# Patient Record
Sex: Male | Born: 1950 | Race: White | Hispanic: No | Marital: Married | Smoking: Never smoker
Health system: Southern US, Community
[De-identification: ages and names within clinical notes are randomized; demographics above are authoritative.]

## PROBLEM LIST (undated history)

## (undated) DIAGNOSIS — I10 Essential (primary) hypertension: Secondary | ICD-10-CM

## (undated) DIAGNOSIS — Z87448 Personal history of other diseases of urinary system: Secondary | ICD-10-CM

## (undated) DIAGNOSIS — J329 Chronic sinusitis, unspecified: Secondary | ICD-10-CM

## (undated) DIAGNOSIS — E785 Hyperlipidemia, unspecified: Secondary | ICD-10-CM

## (undated) DIAGNOSIS — J189 Pneumonia, unspecified organism: Secondary | ICD-10-CM

## (undated) DIAGNOSIS — G4733 Obstructive sleep apnea (adult) (pediatric): Secondary | ICD-10-CM

## (undated) DIAGNOSIS — R011 Cardiac murmur, unspecified: Secondary | ICD-10-CM

## (undated) DIAGNOSIS — M179 Osteoarthritis of knee, unspecified: Secondary | ICD-10-CM

## (undated) DIAGNOSIS — S42009A Fracture of unspecified part of unspecified clavicle, initial encounter for closed fracture: Secondary | ICD-10-CM

## (undated) DIAGNOSIS — I499 Cardiac arrhythmia, unspecified: Secondary | ICD-10-CM

## (undated) DIAGNOSIS — K219 Gastro-esophageal reflux disease without esophagitis: Secondary | ICD-10-CM

## (undated) DIAGNOSIS — Z87438 Personal history of other diseases of male genital organs: Secondary | ICD-10-CM

## (undated) DIAGNOSIS — R519 Headache, unspecified: Secondary | ICD-10-CM

## (undated) DIAGNOSIS — M1710 Unilateral primary osteoarthritis, unspecified knee: Secondary | ICD-10-CM

## (undated) DIAGNOSIS — K760 Fatty (change of) liver, not elsewhere classified: Secondary | ICD-10-CM

## (undated) DIAGNOSIS — I509 Heart failure, unspecified: Secondary | ICD-10-CM

## (undated) DIAGNOSIS — J40 Bronchitis, not specified as acute or chronic: Secondary | ICD-10-CM

## (undated) DIAGNOSIS — R4189 Other symptoms and signs involving cognitive functions and awareness: Secondary | ICD-10-CM

## (undated) DIAGNOSIS — Z8719 Personal history of other diseases of the digestive system: Secondary | ICD-10-CM

## (undated) DIAGNOSIS — N181 Chronic kidney disease, stage 1: Secondary | ICD-10-CM

## (undated) DIAGNOSIS — N02B9 Other recurrent and persistent immunoglobulin A nephropathy: Secondary | ICD-10-CM

## (undated) DIAGNOSIS — I739 Peripheral vascular disease, unspecified: Secondary | ICD-10-CM

## (undated) DIAGNOSIS — N028 Recurrent and persistent hematuria with other morphologic changes: Secondary | ICD-10-CM

## (undated) DIAGNOSIS — Z9989 Dependence on other enabling machines and devices: Secondary | ICD-10-CM

## (undated) DIAGNOSIS — J302 Other seasonal allergic rhinitis: Secondary | ICD-10-CM

## (undated) HISTORY — DX: Personal history of other diseases of male genital organs: Z87.438

## (undated) HISTORY — PX: OTHER SURGICAL HISTORY: SHX169

## (undated) HISTORY — PX: HERNIA REPAIR: SHX51

## (undated) HISTORY — DX: Personal history of other diseases of urinary system: Z87.448

## (undated) HISTORY — DX: Gastro-esophageal reflux disease without esophagitis: K21.9

## (undated) HISTORY — DX: Other seasonal allergic rhinitis: J30.2

## (undated) HISTORY — DX: Recurrent and persistent hematuria with other morphologic changes: N02.8

## (undated) HISTORY — DX: Other recurrent and persistent immunoglobulin A nephropathy: N02.B9

## (undated) HISTORY — DX: Fracture of unspecified part of unspecified clavicle, initial encounter for closed fracture: S42.009A

## (undated) HISTORY — DX: Osteoarthritis of knee, unspecified: M17.9

## (undated) HISTORY — DX: Chronic kidney disease, stage 1: N18.1

## (undated) HISTORY — DX: Obstructive sleep apnea (adult) (pediatric): G47.33

## (undated) HISTORY — PX: CERVICAL SPINE SURGERY: SHX589

## (undated) HISTORY — PX: TONSILLECTOMY: SUR1361

## (undated) HISTORY — DX: Unilateral primary osteoarthritis, unspecified knee: M17.10

## (undated) HISTORY — DX: Cardiac murmur, unspecified: R01.1

## (undated) HISTORY — DX: Dependence on other enabling machines and devices: Z99.89

## (undated) HISTORY — DX: Peripheral vascular disease, unspecified: I73.9

## (undated) HISTORY — PX: CATARACT EXTRACTION: SUR2

## (undated) SURGERY — ENDOSCOPY, POUCH, SMALL INTESTINE, DIAGNOSTIC
Anesthesia: Monitor Anesthesia Care

---

## 2001-08-19 ENCOUNTER — Encounter: Payer: Self-pay | Admitting: Endocrinology

## 2001-08-19 ENCOUNTER — Ambulatory Visit (HOSPITAL_COMMUNITY): Admission: RE | Admit: 2001-08-19 | Discharge: 2001-08-19 | Payer: Self-pay | Admitting: Endocrinology

## 2003-03-06 ENCOUNTER — Ambulatory Visit (HOSPITAL_COMMUNITY): Admission: RE | Admit: 2003-03-06 | Discharge: 2003-03-06 | Payer: Self-pay | Admitting: Internal Medicine

## 2003-03-06 LAB — PULMONARY FUNCTION TEST

## 2003-10-23 ENCOUNTER — Ambulatory Visit (HOSPITAL_COMMUNITY): Admission: RE | Admit: 2003-10-23 | Discharge: 2003-10-23 | Payer: Self-pay | Admitting: Gastroenterology

## 2003-10-23 ENCOUNTER — Encounter (INDEPENDENT_AMBULATORY_CARE_PROVIDER_SITE_OTHER): Payer: Self-pay | Admitting: Specialist

## 2006-11-27 ENCOUNTER — Emergency Department (HOSPITAL_COMMUNITY): Admission: EM | Admit: 2006-11-27 | Discharge: 2006-11-27 | Payer: Self-pay | Admitting: Emergency Medicine

## 2009-04-21 DIAGNOSIS — J301 Allergic rhinitis due to pollen: Secondary | ICD-10-CM | POA: Insufficient documentation

## 2009-04-21 DIAGNOSIS — K219 Gastro-esophageal reflux disease without esophagitis: Secondary | ICD-10-CM | POA: Insufficient documentation

## 2009-04-21 DIAGNOSIS — M159 Polyosteoarthritis, unspecified: Secondary | ICD-10-CM | POA: Insufficient documentation

## 2009-04-21 DIAGNOSIS — E782 Mixed hyperlipidemia: Secondary | ICD-10-CM | POA: Insufficient documentation

## 2009-10-13 ENCOUNTER — Encounter: Admission: RE | Admit: 2009-10-13 | Discharge: 2009-10-13 | Payer: Self-pay | Admitting: Internal Medicine

## 2009-10-26 ENCOUNTER — Encounter: Admission: RE | Admit: 2009-10-26 | Discharge: 2009-10-26 | Payer: Self-pay | Admitting: Neurological Surgery

## 2009-12-13 ENCOUNTER — Encounter: Admission: RE | Admit: 2009-12-13 | Discharge: 2009-12-13 | Payer: Self-pay | Admitting: Neurological Surgery

## 2010-09-23 NOTE — Op Note (Signed)
Donald [name], Donald [name]                         ACCOUNT NO.:  [id number]   MEDICAL RECORD NO.:  [id number]                   PATIENT TYPE:  AMB   LOCATION:  ENDO                                 FACILITY:  [facility]   PHYSICIAN:  Petra Kuba, M.D.                 DATE OF BIRTH:  06-Jan-1951   DATE OF PROCEDURE:  10/23/2003  DATE OF DISCHARGE:                                 OPERATIVE REPORT   PROCEDURE:  Colonoscopy.   INDICATIONS FOR PROCEDURE:  Screening.   Consent was signed after risks, benefits, methods, and options were  thoroughly discussed in the office.   MEDICINES USED:  Demerol 60, Versed 8.   DESCRIPTION OF PROCEDURE:  Rectal inspection was pertinent for small  external hemorrhoids. Digital exam was negative. The pediatric video  adjustable colonoscope was inserted, fairly easily advanced around the colon  to the cecum.  On insertion, some left sided diverticula was seen as well as  a proximal ascending just before the hepatic flexure pedunculated polyp. No  other abnormalities were seen.  To advance to the cecum required some left  lower quadrant pressure but no position changes. The cecum was identified  the appendiceal orifice and the ileocecal valve.  In fact, the scope was  inserted a short ways into the terminal ileum which was normal, photo  documentation was obtained. The scope was slowly withdrawn. Along the  ileocecal valve, there was a possible sessile polyp which was hot biopsied  x3 and put in the first container, the scope was slowly withdrawn.  The prep  was adequate. There was some liquid stool that required washing and  suctioning. In the proximal level of the hepatic flexure, questionable other  polyp was seen, soft, atypical appearance and was cold biopsied x3 and put  in the second container. We then went ahead and snared the pedunculated  polyp, it was a little harder than most polyps and a little lighter,  possibly a stromal cell tumor. Once this was  snared and removed,  electrocautery applied, the polyp was grabbed with the snare and we went  ahead and withdrew the scope back to the rectum, the polyp was recovered.  The scope was reinserted, advanced to the level of the polypectomy site,  there was no signs of bleeding.  The scope was then further withdrawn.  No  obvious residual polyp was seen. The scope was further withdrawn. The rest  of the transverse was normal. The scope was withdrawn around the left side  of the colon, three tiny left sided polyps were seen and were all hot  biopsied x1 or 2 and put in a fourth container.  There was some left sided  diverticula mentioned above, anorectal pullthrough and retroflexion  confirmed some small hemorrhoids.  He did have some difficulty holding air  which made complete evaluation of the sigmoid and rectum difficult but with  holding his rectum shut  I believe adequate visualization was obtained.  The  scope was reinserted a short ways up the left side of the colon, air was  suctioned, scope removed.  The patient tolerated the procedure well. There  was no obvious or immediate complications.   ENDOSCOPIC DIAGNOSIS:  1. Internal and external hemorrhoids.  2. Left occasional diverticula.  3. Three tiny left sided polyps hot biopsied.  4. Proximal transverse possible stromal cell tumor versus polyp status post     snare.  5. Hepatic flexure questionable polyp cold biopsied.  6. Ileocecal valve questionable polyp hot biopsied.  Otherwise within normal     limits to the terminal ileum.   PLAN:  Await pathology to determine future colonic screening.  One week of  holding aspirin and nonsteroidal's.  Happy to see back p.r.n. otherwise  return care to Dr. Felipa Eth for the customary health care maintenance to include  yearly rectals and guaiacs.                                               Petra Kuba, M.D.    MEM/MEDQ  D:  10/23/2003  T:  10/23/2003  Job:  [id number]   cc:   Larina Earthly,  M.D.  [address]  [place]  [place] [address]  Fax: [phone]

## 2011-02-20 LAB — I-STAT 8, (EC8 V) (CONVERTED LAB)
BUN: 16
Chloride: 105
HCT: 47
Hemoglobin: 16
Operator id: 277751
Potassium: 3.4 — ABNORMAL LOW
Sodium: 141
pCO2, Ven: 42.4 — ABNORMAL LOW

## 2011-02-20 LAB — POCT CARDIAC MARKERS: CKMB, poc: <1 — ABNORMAL LOW

## 2011-07-06 ENCOUNTER — Encounter (HOSPITAL_COMMUNITY): Payer: Self-pay | Admitting: Emergency Medicine

## 2011-07-06 ENCOUNTER — Other Ambulatory Visit: Payer: Self-pay

## 2011-07-06 ENCOUNTER — Emergency Department (HOSPITAL_COMMUNITY): Payer: PRIVATE HEALTH INSURANCE

## 2011-07-06 ENCOUNTER — Inpatient Hospital Stay (HOSPITAL_COMMUNITY)
Admission: EM | Admit: 2011-07-06 | Discharge: 2011-07-10 | DRG: 194 | Disposition: A | Discharge status: Home / Self Care | Payer: PRIVATE HEALTH INSURANCE | Attending: Internal Medicine | Admitting: Internal Medicine

## 2011-07-06 DIAGNOSIS — Z87441 Personal history of nephrotic syndrome: Secondary | ICD-10-CM

## 2011-07-06 DIAGNOSIS — J189 Pneumonia, unspecified organism: Principal | ICD-10-CM

## 2011-07-06 DIAGNOSIS — E119 Type 2 diabetes mellitus without complications: Secondary | ICD-10-CM | POA: Diagnosis present

## 2011-07-06 DIAGNOSIS — E1165 Type 2 diabetes mellitus with hyperglycemia: Secondary | ICD-10-CM | POA: Insufficient documentation

## 2011-07-06 DIAGNOSIS — E785 Hyperlipidemia, unspecified: Secondary | ICD-10-CM | POA: Diagnosis present

## 2011-07-06 DIAGNOSIS — R0902 Hypoxemia: Secondary | ICD-10-CM

## 2011-07-06 DIAGNOSIS — R091 Pleurisy: Secondary | ICD-10-CM | POA: Diagnosis not present

## 2011-07-06 DIAGNOSIS — R0602 Shortness of breath: Secondary | ICD-10-CM | POA: Diagnosis present

## 2011-07-06 DIAGNOSIS — J45901 Unspecified asthma with (acute) exacerbation: Secondary | ICD-10-CM | POA: Diagnosis present

## 2011-07-06 DIAGNOSIS — I10 Essential (primary) hypertension: Secondary | ICD-10-CM | POA: Diagnosis present

## 2011-07-06 DIAGNOSIS — N059 Unspecified nephritic syndrome with unspecified morphologic changes: Secondary | ICD-10-CM | POA: Diagnosis present

## 2011-07-06 DIAGNOSIS — Z8249 Family history of ischemic heart disease and other diseases of the circulatory system: Secondary | ICD-10-CM

## 2011-07-06 DIAGNOSIS — D72829 Elevated white blood cell count, unspecified: Secondary | ICD-10-CM | POA: Diagnosis present

## 2011-07-06 DIAGNOSIS — I519 Heart disease, unspecified: Secondary | ICD-10-CM | POA: Diagnosis present

## 2011-07-06 HISTORY — DX: Essential (primary) hypertension: I10

## 2011-07-06 HISTORY — DX: Pneumonia, unspecified organism: J18.9

## 2011-07-06 HISTORY — DX: Bronchitis, not specified as acute or chronic: J40

## 2011-07-06 HISTORY — DX: Chronic sinusitis, unspecified: J32.9

## 2011-07-06 HISTORY — DX: Hyperlipidemia, unspecified: E78.5

## 2011-07-06 LAB — CREATININE, SERUM
Creatinine, Ser: 0.65 mg/dL (ref 0.50–1.35)
GFR calc Af Amer: >90 mL/min (ref >90)
GFR calc non Af Amer: >90 mL/min (ref >90)

## 2011-07-06 LAB — CBC
HCT: 45.9 % (ref 39.0–52.0)
Hemoglobin: 15.7 g/dL (ref 13.0–17.0)
MCH: 28.9 pg (ref 26.0–34.0)
MCHC: 34.2 g/dL (ref 30.0–36.0)
MCV: 84.4 fL (ref 78.0–100.0)
Platelets: 316 10*3/uL (ref 150–400)
Platelets: 321 10*3/uL (ref 150–400)
RBC: 5.44 MIL/uL (ref 4.22–5.81)
RBC: 5.39 MIL/uL (ref 4.22–5.81)
RDW: 14.8 % (ref 11.5–15.5)
WBC: 14.9 10*3/uL — ABNORMAL HIGH (ref 4.0–10.5)
WBC: 14.8 10*3/uL — ABNORMAL HIGH (ref 4.0–10.5)

## 2011-07-06 LAB — DIFFERENTIAL
Basophils Absolute: 0 10*3/uL (ref 0.0–0.1)
Basophils Relative: 0 % (ref 0–1)
Eosinophils Absolute: 0.1 K/uL (ref 0.0–0.7)
Eosinophils Relative: 1 % (ref 0–5)
Lymphocytes Relative: 27 % (ref 12–46)
Lymphs Abs: 4 10*3/uL (ref 0.7–4.0)
Monocytes Absolute: 0.8 K/uL (ref 0.1–1.0)
Monocytes Relative: 6 % (ref 3–12)
Neutro Abs: 9.9 10*3/uL — ABNORMAL HIGH (ref 1.7–7.7)
Neutrophils Relative %: 67 % (ref 43–77)

## 2011-07-06 LAB — CARDIAC PANEL(CRET KIN+CKTOT+MB+TROPI)
CK, MB: 4.6 ng/mL — ABNORMAL HIGH (ref 0.3–4.0)
Relative Index: 2.8 — ABNORMAL HIGH (ref 0.0–2.5)
Troponin I: <0.3 ng/mL (ref <0.30)

## 2011-07-06 LAB — BASIC METABOLIC PANEL
CO2: 25 mEq/L (ref 19–32)
GFR calc non Af Amer: >90 mL/min (ref >90)
Glucose, Bld: 103 mg/dL — ABNORMAL HIGH (ref 70–99)
Potassium: 3.3 mEq/L — ABNORMAL LOW (ref 3.5–5.1)
Sodium: 140 mEq/L (ref 135–145)

## 2011-07-06 LAB — BASIC METABOLIC PANEL WITH GFR
BUN: 18 mg/dL (ref 6–23)
Calcium: 9.6 mg/dL (ref 8.4–10.5)
Chloride: 98 meq/L (ref 96–112)
Creatinine, Ser: 0.62 mg/dL (ref 0.50–1.35)
GFR calc Af Amer: >90 mL/min (ref >90)

## 2011-07-06 MED ORDER — OMEGA-3-ACID ETHYL ESTERS 1 G PO CAPS
1.0000 g | ORAL_CAPSULE | Freq: Every day | ORAL | Status: DC
  Administered 2011-07-06 – 2011-07-09 (×4): 1 g via ORAL
  Filled 2011-07-06 (×5): qty 1

## 2011-07-06 MED ORDER — IPRATROPIUM BROMIDE 0.02 % IN SOLN
0.5000 mg | Freq: Once | RESPIRATORY_TRACT | Status: AC
  Administered 2011-07-06: 0.5 mg via RESPIRATORY_TRACT
  Filled 2011-07-06: qty 2.5

## 2011-07-06 MED ORDER — DILTIAZEM HCL ER COATED BEADS 240 MG PO CP24
240.0000 mg | ORAL_CAPSULE | Freq: Every day | ORAL | Status: DC
Start: 2011-07-07 — End: 2011-07-10
  Administered 2011-07-07 – 2011-07-10 (×4): 240 mg via ORAL
  Filled 2011-07-06 (×4): qty 1

## 2011-07-06 MED ORDER — BUPROPION HCL ER (XL) 150 MG PO TB24
150.0000 mg | ORAL_TABLET | Freq: Every day | ORAL | Status: DC
  Administered 2011-07-07 – 2011-07-10 (×4): 150 mg via ORAL
  Filled 2011-07-06 (×4): qty 1

## 2011-07-06 MED ORDER — DEXTROSE 5 % IV SOLN
1.0000 g | INTRAVENOUS | Status: DC
  Administered 2011-07-06 – 2011-07-09 (×4): 1 g via INTRAVENOUS
  Filled 2011-07-06 (×5): qty 10

## 2011-07-06 MED ORDER — ONDANSETRON HCL 4 MG PO TABS: 4.0000 mg | ORAL_TABLET | Freq: Four times a day (QID) | ORAL | Status: DC | PRN

## 2011-07-06 MED ORDER — NEBIVOLOL HCL 10 MG PO TABS
10.0000 mg | ORAL_TABLET | Freq: Every evening | ORAL | Status: DC
  Administered 2011-07-06 – 2011-07-09 (×4): 10 mg via ORAL
  Filled 2011-07-06 (×5): qty 1

## 2011-07-06 MED ORDER — NITROGLYCERIN 0.4 MG SL SUBL: 0.4000 mg | SUBLINGUAL_TABLET | SUBLINGUAL | Status: DC | PRN

## 2011-07-06 MED ORDER — HYDROCHLOROTHIAZIDE 12.5 MG PO CAPS
12.5000 mg | ORAL_CAPSULE | Freq: Every day | ORAL | Status: DC
  Administered 2011-07-06 – 2011-07-10 (×5): 12.5 mg via ORAL
  Filled 2011-07-06 (×5): qty 1

## 2011-07-06 MED ORDER — ASPIRIN EC 325 MG PO TBEC
325.0000 mg | DELAYED_RELEASE_TABLET | Freq: Every day | ORAL | Status: DC
  Administered 2011-07-07 – 2011-07-10 (×4): 325 mg via ORAL
  Filled 2011-07-06 (×4): qty 1

## 2011-07-06 MED ORDER — ACETAMINOPHEN 650 MG RE SUPP: 650.0000 mg | Freq: Four times a day (QID) | RECTAL | Status: DC | PRN

## 2011-07-06 MED ORDER — METHYLPREDNISOLONE SODIUM SUCC 125 MG IJ SOLR
125.0000 mg | Freq: Once | INTRAMUSCULAR | Status: AC
  Administered 2011-07-06: 125 mg via INTRAVENOUS
  Filled 2011-07-06: qty 2

## 2011-07-06 MED ORDER — ALBUTEROL SULFATE (5 MG/ML) 0.5% IN NEBU
2.5000 mg | INHALATION_SOLUTION | Freq: Four times a day (QID) | RESPIRATORY_TRACT | Status: DC
  Administered 2011-07-07 – 2011-07-10 (×6): 2.5 mg via RESPIRATORY_TRACT
  Filled 2011-07-06 (×9): qty 0.5

## 2011-07-06 MED ORDER — FLUTICASONE PROPIONATE 50 MCG/ACT NA SUSP
2.0000 | Freq: Every day | NASAL | Status: DC | PRN
  Filled 2011-07-06: qty 16

## 2011-07-06 MED ORDER — HYDROCOD POLST-CHLORPHEN POLST 10-8 MG/5ML PO LQCR
5.0000 mL | Freq: Once | ORAL | Status: AC
  Administered 2011-07-06: 5 mL via ORAL
  Filled 2011-07-06: qty 5

## 2011-07-06 MED ORDER — SODIUM CHLORIDE 0.9 % IJ SOLN
3.0000 mL | Freq: Two times a day (BID) | INTRAMUSCULAR | Status: DC
  Administered 2011-07-06 – 2011-07-10 (×8): 3 mL via INTRAVENOUS

## 2011-07-06 MED ORDER — ONDANSETRON HCL 4 MG/2ML IJ SOLN: 4.0000 mg | Freq: Four times a day (QID) | INTRAMUSCULAR | Status: DC | PRN

## 2011-07-06 MED ORDER — OLMESARTAN MEDOXOMIL 40 MG PO TABS
40.0000 mg | ORAL_TABLET | Freq: Every day | ORAL | Status: DC
  Administered 2011-07-06 – 2011-07-10 (×5): 40 mg via ORAL
  Filled 2011-07-06 (×5): qty 1

## 2011-07-06 MED ORDER — FENOFIBRATE 160 MG PO TABS
160.0000 mg | ORAL_TABLET | Freq: Every day | ORAL | Status: DC
  Administered 2011-07-07 – 2011-07-10 (×4): 160 mg via ORAL
  Filled 2011-07-06 (×4): qty 1

## 2011-07-06 MED ORDER — ENOXAPARIN SODIUM 40 MG/0.4ML ~~LOC~~ SOLN
40.0000 mg | Freq: Every day | SUBCUTANEOUS | Status: DC
  Administered 2011-07-06 – 2011-07-09 (×4): 40 mg via SUBCUTANEOUS
  Filled 2011-07-06 (×5): qty 0.4

## 2011-07-06 MED ORDER — DEXTROSE 5 % IV SOLN
500.0000 mg | INTRAVENOUS | Status: DC
  Administered 2011-07-06 – 2011-07-09 (×4): 500 mg via INTRAVENOUS
  Filled 2011-07-06 (×6): qty 500

## 2011-07-06 MED ORDER — INSULIN ASPART 100 UNIT/ML ~~LOC~~ SOLN
0.0000 [IU] | Freq: Three times a day (TID) | SUBCUTANEOUS | Status: DC
  Filled 2011-07-06: qty 3

## 2011-07-06 MED ORDER — DEXTROSE 5 % IV SOLN
500.0000 mg | Freq: Once | INTRAVENOUS | Status: AC
  Administered 2011-07-06: 500 mg via INTRAVENOUS
  Filled 2011-07-06: qty 500

## 2011-07-06 MED ORDER — DEXTROSE 5 % IV SOLN
1.0000 g | Freq: Once | INTRAVENOUS | Status: AC
  Administered 2011-07-06: 1 g via INTRAVENOUS
  Filled 2011-07-06: qty 10

## 2011-07-06 MED ORDER — ACETAMINOPHEN 325 MG PO TABS
650.0000 mg | ORAL_TABLET | Freq: Four times a day (QID) | ORAL | Status: DC | PRN
  Administered 2011-07-07 (×2): 650 mg via ORAL
  Filled 2011-07-06 (×2): qty 2

## 2011-07-06 MED ORDER — ALBUTEROL SULFATE (5 MG/ML) 0.5% IN NEBU
5.0000 mg | INHALATION_SOLUTION | Freq: Once | RESPIRATORY_TRACT | Status: AC
  Administered 2011-07-06: 5 mg via RESPIRATORY_TRACT
  Filled 2011-07-06: qty 1

## 2011-07-06 MED ORDER — LORATADINE 10 MG PO TABS
10.0000 mg | ORAL_TABLET | Freq: Every day | ORAL | Status: DC
  Administered 2011-07-07 – 2011-07-10 (×4): 10 mg via ORAL
  Filled 2011-07-06 (×4): qty 1

## 2011-07-06 MED ORDER — ALBUTEROL SULFATE (5 MG/ML) 0.5% IN NEBU
2.5000 mg | INHALATION_SOLUTION | RESPIRATORY_TRACT | Status: DC | PRN
  Administered 2011-07-08: 2.5 mg via RESPIRATORY_TRACT
  Filled 2011-07-06: qty 0.5

## 2011-07-06 MED ORDER — INSULIN ASPART 100 UNIT/ML ~~LOC~~ SOLN: 0.0000 [IU] | Freq: Every day | SUBCUTANEOUS | Status: DC

## 2011-07-06 MED ORDER — BUDESONIDE 0.5 MG/2ML IN SUSP
0.5000 mg | Freq: Two times a day (BID) | RESPIRATORY_TRACT | Status: DC
  Administered 2011-07-07 – 2011-07-10 (×3): 0.5 mg via RESPIRATORY_TRACT
  Filled 2011-07-06 (×10): qty 2

## 2011-07-06 MED ORDER — SODIUM CHLORIDE 0.9 % IJ SOLN: 3.0000 mL | Freq: Two times a day (BID) | INTRAMUSCULAR | Status: DC

## 2011-07-06 MED ORDER — NITROGLYCERIN 0.4 MG/HR TD PT24
0.4000 mg | MEDICATED_PATCH | Freq: Every day | TRANSDERMAL | Status: DC
  Administered 2011-07-06: 0.4 mg via TRANSDERMAL
  Filled 2011-07-06 (×2): qty 1

## 2011-07-06 MED ORDER — OLMESARTAN MEDOXOMIL-HCTZ 40-12.5 MG PO TABS: 1.0000 | ORAL_TABLET | Freq: Every evening | ORAL | Status: DC

## 2011-07-06 MED ORDER — GLIMEPIRIDE 1 MG PO TABS
1.0000 mg | ORAL_TABLET | Freq: Every day | ORAL | Status: DC
  Administered 2011-07-07 – 2011-07-10 (×4): 1 mg via ORAL
  Filled 2011-07-06 (×5): qty 1

## 2011-07-06 NOTE — ED Notes (Signed)
Patient is resting comfortably. 

## 2011-07-06 NOTE — ED Notes (Signed)
Per [facility]- EMS called to [facility] where patient had gone for a cough.  Chest xray confirmed pneumonia and EMS was called to transport patient to ED. BP 170/100 HR 80 and regular sats 94% on 2L.

## 2011-07-06 NOTE — ED Notes (Signed)
Pt eating dinner. hospitalist at bedside

## 2011-07-06 NOTE — ED Notes (Signed)
Family at bedside. 

## 2011-07-06 NOTE — ED Notes (Signed)
Diet ordered 

## 2011-07-06 NOTE — ED Provider Notes (Signed)
History     CSN: [id number]  Arrival date & time 07/06/11  1636   First MD Initiated Contact with Patient 07/06/11 1711      Chief Complaint  Patient presents with  . Cough    (Consider location/radiation/quality/duration/timing/severity/associated sxs/prior treatment) HPI Comments: Pt with h.o pneumonia, battling sinus infection, URI, chest tightness similar to prior episodes for about 1.5 weeks, having been on augmentin, z pak, seen on Monday by PCP adn again today.  CXR from Monday apparently over-read as pneumonia then.  Here, gettign more SOB, anterior upper chest and base of throat gets tight, especially with too much talking or coughing spell and gets SOB, feels he cannot breathe deeply or else he will get a coughing fit.  Has been on steroid taper, inhaler and tussionex at home, but still getting worse.  PCP sent here, for admit, possibly just overnight.  No N/V/D.  No fevers, occasional chills.  No back pain, abd pain.  Went on a cruise about 1-2 months ago.  No lower extremity swelling or calf tenderness  Patient is a 61 y.o. male presenting with cough. The history is provided by the patient and the spouse.  Cough Associated symptoms include sore throat and shortness of breath. Pertinent negatives include no headaches.    Past Medical History  Diagnosis Date  . Diabetes mellitus   . Hypertension   . Hyperlipidemia   . Pneumonia   . Sinusitis   . Bronchitis     No past surgical history on file.  No family history on file.  History  Substance Use Topics  . Smoking status: Never Smoker   . Smokeless tobacco: Not on file  . Alcohol Use:       Review of Systems  HENT: Positive for sore throat and sinus pressure.   Respiratory: Positive for cough, chest tightness and shortness of breath.   Gastrointestinal: Negative for nausea, vomiting, abdominal pain and diarrhea.  Neurological: Negative for weakness and headaches.  All other systems reviewed and are  negative.    Allergies  Ciprofloxacin and Levaquin  Home Medications   Current Outpatient Rx  Name Route Sig Dispense Refill  . ACETAMINOPHEN 500 MG PO TABS Oral Take 500 mg by mouth every 6 (six) hours as needed. For pain/ headache    . ALBUTEROL SULFATE HFA 108 (90 BASE) MCG/ACT IN AERS Inhalation Inhale 2 puffs into the lungs every 4 (four) hours as needed. For shortness of breath    . AMOXICILLIN-POT CLAVULANATE 875-125 MG PO TABS Oral Take 1 tablet by mouth 2 (two) times daily. Until 07/08/11    . ASPIRIN EC 81 MG PO TBEC Oral Take 81 mg by mouth daily.    . AZITHROMYCIN 250 MG PO TABS Oral Take 250 mg by mouth daily.    . BUDESONIDE-FORMOTEROL FUMARATE 160-4.5 MCG/ACT IN AERO Inhalation Inhale 2 puffs into the lungs 2 (two) times daily as needed. For wheezing    . BUPROPION HCL ER (XL) 150 MG PO TB24 Oral Take 150 mg by mouth daily.    Marland Kitchen HYDROCOD POLST-CPM POLST ER 10-8 MG/5ML PO LQCR Oral Take 5 mLs by mouth every 12 (twelve) hours as needed. For cough    . DILTIAZEM HCL ER COATED BEADS 240 MG PO CP24 Oral Take 240 mg by mouth daily.    . FENOFIBRATE 160 MG PO TABS Oral Take 160 mg by mouth daily.    Marland Kitchen FEXOFENADINE HCL 180 MG PO TABS Oral Take 180 mg by mouth daily  as needed. For allergies    . FLUTICASONE PROPIONATE 50 MCG/ACT NA SUSP Nasal Place 2 sprays into the nose daily as needed. Allergies    . GLIMEPIRIDE 1 MG PO TABS Oral Take 1 mg by mouth daily before breakfast.    . METFORMIN HCL 1000 MG PO TABS Oral Take 1,000 mg by mouth 2 (two) times daily with a meal.    . NEBIVOLOL HCL 10 MG PO TABS Oral Take 10 mg by mouth every evening.    Marland Kitchen OLMESARTAN MEDOXOMIL-HCTZ 40-12.5 MG PO TABS Oral Take 1 tablet by mouth every evening.    Marland Kitchen OMEGA-3-ACID ETHYL ESTERS 1 G PO CAPS Oral Take 1 g by mouth daily.    Marland Kitchen PREDNISONE (PAK) 10 MG PO TABS Oral Take 5-30 mg by mouth daily. 30mg  for 3 days; 20mg  for 3 days; 10mg  for 3 days; 5mg  for 3 days      BP 147/90  Pulse 83  Temp(Src) 98.7 F  (37.1 C) (Oral)  Resp 22  SpO2 93%  Physical Exam  Nursing note and vitals reviewed. Constitutional: He is oriented to person, place, and time. He appears well-developed and well-nourished.  HENT:  Head: Normocephalic and atraumatic.  Eyes: Pupils are equal, round, and reactive to light. No scleral icterus.  Neck: Neck supple.  Cardiovascular: Normal rate and regular rhythm.   Pulmonary/Chest: Effort normal.       Shallow breathing only, feels can't take a deep breath, minimal wheeze with expiration, no stridor noted  Abdominal: He exhibits no distension. There is no tenderness.  Neurological: He is alert and oriented to person, place, and time.  Skin: Skin is warm and dry.  Psychiatric: He has a normal mood and affect.    ED Course  Procedures (including critical care time)  Labs Reviewed  CBC - Abnormal; Notable for the following:    WBC 14.9 (*)    All other components within normal limits  DIFFERENTIAL - Abnormal; Notable for the following:    Neutro Abs 9.9 (*)    All other components within normal limits  BASIC METABOLIC PANEL - Abnormal; Notable for the following:    Potassium 3.3 (*)    Glucose, Bld 103 (*)    All other components within normal limits   Dg Chest 2 View  07/06/2011  *RADIOLOGY REPORT*  Clinical Data: Cough for 1.5 weeks question pneumonia, history hypertension and diabetes  CHEST - 2 VIEW  Comparison: 11/27/2006  Findings: Enlargement of cardiac silhouette. Mediastinal contours and pulmonary vascularity normal. Left lower lobe opacity question atelectasis versus consolidation. Underlying mass not excluded. Remaining lungs clear. No pleural effusion or pneumothorax. Prior cervical spine fusion.  IMPRESSION: Left lower lobe opacity question atelectasis versus consolidation although underlying mass is not excluded. Follow-up radiographic assessment until resolution recommended to exclude tumor.  Original Report Authenticated By: Lollie Marrow, M.D.     1.  Community acquired pneumonia   2. Hypoxia     sats range 89-95% on [place] which is low.  MDM  Likely needs admission for mild hypoxia, goes down to 89% when talking despite O2 by [place].  Will get labs, give neb, IV steroids, IV abx for CAP and discuss with hospitalist.        7:51 PM Spoke to [facility] who will admit.  I viewed CXR myself, per radiologist, pneumonia.  Pt feels improved at present after nebs.  [facility] to see and admit. WBC is 15  Gavin Pound. Oletta Lamas, MD 07/06/11 1610

## 2011-07-06 NOTE — H&P (Signed)
Donald [name] is an 61 y.o. male.   PCP - Dr.Avva.   Chief Complaint: Shortness of breath. HPI: 36-year-old male with history of diabetes mellitus type 2, hypertension, IgA nephropathy has been experiencing some upper respiratory tract infection which has been treated with Augmentin 10 days ago hand surgery and he was started on Zithromax and prednisone 5 days ago as his symptoms did not get better. Today he came to the ER because at work he felt chest tightness and shortness of breath. His chest tightness was like a heaviness which is increased on coughing. He's been having productive cough since 2 weeks now. His chest x-ray in the ER shows some left-sided consolidation versus atelectasis with the blood work showing leukocytosis patient has been admitted for pneumonia. His EKG shows T-wave changes diffusely. These changes are not present in the early EKG done in 2008. At this time patient is chest pain-free. Patient denies any nausea vomiting diaphoresis dizziness palpitations abdominal pain dysuria discharges or diarrhea.   Past Medical History  Diagnosis Date  . Diabetes mellitus   . Hypertension   . Hyperlipidemia   . Pneumonia   . Sinusitis   . Bronchitis     Past Surgical History  Procedure Date  . Cervical spine surgery   . Hernia repair     Family History  Problem Relation Age of Onset  . Coronary artery disease Father   . Diabetes type II Father    Social History:  reports that he has never smoked. He does not have any smokeless tobacco history on file. His alcohol and drug histories not on file.  Allergies:  Allergies  Allergen Reactions  . Ciprofloxacin     Shoulder cramping  . Levaquin     cramping    Medications Prior to Admission  Medication Dose Route Frequency Provider Last Rate Last Dose  . albuterol (PROVENTIL) (5 MG/ML) 0.5% nebulizer solution 5 mg  5 mg Nebulization Once Gavin Pound. Ghim, MD   5 mg at 07/06/11 1909  . azithromycin (ZITHROMAX) 500 mg in  dextrose 5 % 250 mL IVPB  500 mg Intravenous Once Gavin Pound. Ghim, MD   500 mg at 07/06/11 1919  . cefTRIAXone (ROCEPHIN) 1 g in dextrose 5 % 50 mL IVPB  1 g Intravenous Once Gavin Pound. Ghim, MD   1 g at 07/06/11 1919  . chlorpheniramine-HYDROcodone (TUSSIONEX) 10-8 MG/5ML suspension 5 mL  5 mL Oral Once Gavin Pound. Ghim, MD   5 mL at 07/06/11 1908  . ipratropium (ATROVENT) nebulizer solution 0.5 mg  0.5 mg Nebulization Once Gavin Pound. Ghim, MD   0.5 mg at 07/06/11 1909  . methylPREDNISolone sodium succinate (SOLU-MEDROL) 125 MG injection 125 mg  125 mg Intravenous Once Gavin Pound. Ghim, MD   125 mg at 07/06/11 1919   No current outpatient prescriptions on file as of 07/06/2011.    Results for orders placed during the hospital encounter of 07/06/11 (from the past 48 hour(s))  CBC     Status: Abnormal   Collection Time   07/06/11  6:03 PM      Component Value Range Comment   WBC 14.9 (*) 4.0 - 10.5 (K/uL)    RBC 5.44  4.22 - 5.81 (MIL/uL)    Hemoglobin 15.7  13.0 - 17.0 (g/dL)    HCT 16.1  09.6 - 04.5 (%)    MCV 84.4  78.0 - 100.0 (fL)    MCH 28.9  26.0 - 34.0 (pg)    MCHC 34.2  30.0 - 36.0 (g/dL)    RDW 24.4  01.0 - 27.2 (%)    Platelets 316  150 - 400 (K/uL)   DIFFERENTIAL     Status: Abnormal   Collection Time   07/06/11  6:03 PM      Component Value Range Comment   Neutrophils Relative 67  43 - 77 (%)    Neutro Abs 9.9 (*) 1.7 - 7.7 (K/uL)    Lymphocytes Relative 27  12 - 46 (%)    Lymphs Abs 4.0  0.7 - 4.0 (K/uL)    Monocytes Relative 6  3 - 12 (%)    Monocytes Absolute 0.8  0.1 - 1.0 (K/uL)    Eosinophils Relative 1  0 - 5 (%)    Eosinophils Absolute 0.1  0.0 - 0.7 (K/uL)    Basophils Relative 0  0 - 1 (%)    Basophils Absolute 0.0  0.0 - 0.1 (K/uL)   BASIC METABOLIC PANEL     Status: Abnormal   Collection Time   07/06/11  6:03 PM      Component Value Range Comment   Sodium 140  135 - 145 (mEq/L)    Potassium 3.3 (*) 3.5 - 5.1 (mEq/L)    Chloride 98  96 - 112 (mEq/L)    CO2  25  19 - 32 (mEq/L)    Glucose, Bld 103 (*) 70 - 99 (mg/dL)    BUN 18  6 - 23 (mg/dL)    Creatinine, Ser 5.36  0.50 - 1.35 (mg/dL)    Calcium 9.6  8.4 - 10.5 (mg/dL)    GFR calc non Af Amer >90  >90 (mL/min)    GFR calc Af Amer >90  >90 (mL/min)    Dg Chest 2 View  07/06/2011  *RADIOLOGY REPORT*  Clinical Data: Cough for 1.5 weeks question pneumonia, history hypertension and diabetes  CHEST - 2 VIEW  Comparison: 11/27/2006  Findings: Enlargement of cardiac silhouette. Mediastinal contours and pulmonary vascularity normal. Left lower lobe opacity question atelectasis versus consolidation. Underlying mass not excluded. Remaining lungs clear. No pleural effusion or pneumothorax. Prior cervical spine fusion.  IMPRESSION: Left lower lobe opacity question atelectasis versus consolidation although underlying mass is not excluded. Follow-up radiographic assessment until resolution recommended to exclude tumor.  Original Report Authenticated By: Lollie Marrow, M.D.    Review of Systems  Constitutional: Negative.   HENT: Positive for congestion.   Eyes: Negative.   Respiratory: Positive for cough and shortness of breath.   Cardiovascular: Negative.   Gastrointestinal: Negative.   Genitourinary: Negative.   Musculoskeletal: Negative.   Skin: Negative.   Neurological: Negative.   Endo/Heme/Allergies: Negative.   Psychiatric/Behavioral: Negative.     Blood pressure 147/90, pulse 83, temperature 98.7 F (37.1 C), temperature source Oral, resp. rate 22, SpO2 93.00%. Physical Exam  Constitutional: He is oriented to person, place, and time. He appears well-developed and well-nourished. No distress.  HENT:  Head: Normocephalic and atraumatic.  Right Ear: External ear normal.  Left Ear: External ear normal.  Nose: Nose normal.  Mouth/Throat: Oropharynx is clear and moist. No oropharyngeal exudate.  Eyes: Pupils are equal, round, and reactive to light.  Neck: Normal range of motion. Neck supple.    Cardiovascular: Normal rate, regular rhythm and normal heart sounds.   Respiratory: Effort normal and breath sounds normal. No respiratory distress. He has no wheezes. He has no rales.  GI: Soft. Bowel sounds are normal. He exhibits no distension. There is no tenderness.  Musculoskeletal: Normal range of motion. He exhibits no edema and no tenderness.  Neurological: He is alert and oriented to person, place, and time.       Moves upper and lower extremities 5/5.  Skin: Skin is warm and dry. No rash noted. He is not diaphoretic. No erythema.  Psychiatric: His behavior is normal.     Assessment/Plan #1. Pneumonia - will treat this as community acquired pneumonia. The patient has been start on ceftriaxone and Zithromax which we will continue. #2. Shortness of breath and chest congestion with EKG changes  - shortness of breath may be related to pneumonia but given his EKG changes and his chest tightness I am also concerned about cardiac issues. We will cycle cardiac markers check BNP place patient on nitroglycerin patch and aspirin. I have consulted Dr. Herbie Baltimore who is on call for [facility]. Dr. Herbie Baltimore is advised to keep it into past midnight in case patient requires interventions. Cardiology will be seeing patient in consult. Will also continue his nebulizers and Pulmicort. #3. History of diabetes mellitus type 2  - continue Amaryl with sliding scale coverage and hold off metformin for now. #4. History of hypertension  - continue present medications. #5. History of IgA nephropathy  - normal creatinine at this time. Monitor metabolic panel.  CODE STATUS  - full code.   Eduard Clos 07/06/2011, 9:25 PM

## 2011-07-06 NOTE — ED Notes (Signed)
Patient states he started with a sinus infection x 1 week ago and was seeing his doctor. Patient returned to doctor today with tight feeling in his chest and was causing SOB. Doctors office performed chest xray today and results are right sided infiltrate or atelectasis. Report in patient chart. Patient denies chest pain. Patient states he can not take a deep breath and he feels tightness in his chest when he tries to take deep breath. Patient placed on monitor and 2L oxygen with sats of 95%. EKG captured on arrival.

## 2011-07-07 LAB — GLUCOSE, CAPILLARY
Glucose-Capillary: 202 mg/dL — ABNORMAL HIGH (ref 70–99)
Glucose-Capillary: 196 mg/dL — ABNORMAL HIGH (ref 70–99)

## 2011-07-07 LAB — COMPREHENSIVE METABOLIC PANEL
ALT: 77 U/L — ABNORMAL HIGH (ref 0–53)
Alkaline Phosphatase: 34 U/L — ABNORMAL LOW (ref 39–117)
BUN: 15 mg/dL (ref 6–23)
CO2: 28 mEq/L (ref 19–32)
Calcium: 9.5 mg/dL (ref 8.4–10.5)
GFR calc Af Amer: >90 mL/min (ref >90)
GFR calc non Af Amer: >90 mL/min (ref >90)
Glucose, Bld: 225 mg/dL — ABNORMAL HIGH (ref 70–99)
Sodium: 136 mEq/L (ref 135–145)

## 2011-07-07 LAB — CBC
MCHC: 33.6 g/dL (ref 30.0–36.0)
RDW: 14.7 % (ref 11.5–15.5)

## 2011-07-07 LAB — CARDIAC PANEL(CRET KIN+CKTOT+MB+TROPI)
CK, MB: 4.2 ng/mL — ABNORMAL HIGH (ref 0.3–4.0)
CK, MB: 3.7 ng/mL (ref 0.3–4.0)
Troponin I: <0.3 ng/mL (ref <0.30)

## 2011-07-07 MED ORDER — INSULIN ASPART 100 UNIT/ML ~~LOC~~ SOLN
0.0000 [IU] | SUBCUTANEOUS | Status: DC
  Administered 2011-07-07 (×2): 3 [IU] via SUBCUTANEOUS
  Administered 2011-07-07: 5 [IU] via SUBCUTANEOUS
  Administered 2011-07-07: 11 [IU] via SUBCUTANEOUS
  Administered 2011-07-08: 8 [IU] via SUBCUTANEOUS
  Administered 2011-07-09: 2 [IU] via SUBCUTANEOUS
  Administered 2011-07-10: 5 [IU] via SUBCUTANEOUS
  Administered 2011-07-10: 3 [IU] via SUBCUTANEOUS
  Filled 2011-07-07 (×2): qty 3

## 2011-07-07 MED ORDER — HYDROCODONE-ACETAMINOPHEN 5-325 MG PO TABS: 1.0000 | ORAL_TABLET | ORAL | Status: DC | PRN

## 2011-07-07 MED ORDER — MORPHINE SULFATE 2 MG/ML IJ SOLN: 2.0000 mg | INTRAMUSCULAR | Status: DC | PRN

## 2011-07-07 MED ORDER — HYDROCOD POLST-CHLORPHEN POLST 10-8 MG/5ML PO LQCR
5.0000 mL | Freq: Two times a day (BID) | ORAL | Status: DC | PRN
  Administered 2011-07-07 – 2011-07-08 (×3): 5 mL via ORAL
  Filled 2011-07-07 (×3): qty 5

## 2011-07-07 MED ORDER — INSULIN GLARGINE 100 UNIT/ML ~~LOC~~ SOLN
5.0000 [IU] | Freq: Every day | SUBCUTANEOUS | Status: DC
  Filled 2011-07-07: qty 3

## 2011-07-07 NOTE — Progress Notes (Signed)
Subjective: Patient states that he feels a little better today.  Objective: Weight change:  No intake or output data in the 24 hours ending 07/07/11 1134  Filed Vitals:   07/07/11 1001  BP: 134/81  Pulse:   Temp:   Resp:    General: Patient appears his stated age. HEENT: Head normocephalic atraumatic. Cardiovascular: Regular rate rhythm. Lungs: Clear to auscultation bilaterally. Abdomen: Positive bowel sounds. Extremities: No edema  Lab Results: reviewed  Studies/Results: Dg Chest 2 View  07/06/2011  *RADIOLOGY REPORT*  Clinical Data: Cough for 1.5 weeks question pneumonia, history hypertension and diabetes  CHEST - 2 VIEW  Comparison: 11/27/2006  Findings: Enlargement of cardiac silhouette. Mediastinal contours and pulmonary vascularity normal. Left lower lobe opacity question atelectasis versus consolidation. Underlying mass not excluded. Remaining lungs clear. No pleural effusion or pneumothorax. Prior cervical spine fusion.  IMPRESSION: Left lower lobe opacity question atelectasis versus consolidation although underlying mass is not excluded. Follow-up radiographic assessment until resolution recommended to exclude tumor.  Original Report Authenticated By: Lollie Marrow, M.D.   Medications: Scheduled Meds:   . albuterol  2.5 mg Nebulization Q6H  . albuterol  5 mg Nebulization Once  . aspirin EC  325 mg Oral Daily  . azithromycin (ZITHROMAX) 500 MG IVPB  500 mg Intravenous Once  . azithromycin  500 mg Intravenous Q24H  . budesonide  0.5 mg Nebulization BID  . buPROPion  150 mg Oral Daily  . cefTRIAXone (ROCEPHIN)  IV  1 g Intravenous Once  . cefTRIAXone (ROCEPHIN)  IV  1 g Intravenous Q24H  . chlorpheniramine-HYDROcodone  5 mL Oral Once  . diltiazem  240 mg Oral Daily  . enoxaparin  40 mg Subcutaneous QHS  . fenofibrate  160 mg Oral Daily  . glimepiride  1 mg Oral QAC breakfast  . hydrochlorothiazide  12.5 mg Oral Daily  . insulin aspart  0-5 Units Subcutaneous QHS    . insulin aspart  0-9 Units Subcutaneous TID WC  . ipratropium  0.5 mg Nebulization Once  . loratadine  10 mg Oral Daily  . methylPREDNISolone (SOLU-MEDROL) injection  125 mg Intravenous Once  . nebivolol  10 mg Oral QPM  . nitroGLYCERIN  0.4 mg Transdermal QHS  . olmesartan  40 mg Oral Daily  . omega-3 acid ethyl esters  1 g Oral QHS  . sodium chloride  3 mL Intravenous Q12H  . DISCONTD: olmesartan-hydrochlorothiazide  1 tablet Oral QPM  . DISCONTD: sodium chloride  3 mL Intravenous Q12H   Continuous Infusions:  PRN Meds:.acetaminophen, acetaminophen, albuterol, fluticasone, morphine injection, nitroGLYCERIN, ondansetron (ZOFRAN) IV, ondansetron  Assessment/Plan: Community acquired pneumonia (07/06/2011) Continue IV Rocephin and Zithromax and breathing treatments.   SOB (shortness of breath) (07/06/2011)/chest tightness/EKG changes  Patient is being evaluated by cardiology. Await their recommendations.   Diabetes mellitus, type 2 (07/06/2011) Continue home medications as well as sliding scale insulin.    HTN (hypertension) (07/06/2011)  Continue his diltiazem, hydrochlorothiazide, nebivolol and olmesartan   H/O primary IgA nephropathy (07/06/2011)  stable  Headache: Patient's headache is most likely secondary to nitro paste. Will DC nitro paste and treat with pain medication as needed  LOS: 1 day   Donald [name] 07/07/2011, 11:34 AM

## 2011-07-07 NOTE — Progress Notes (Addendum)
Nutrition Brief Note  Reviewed chart. Patient triggered for "dietitian consulted needed" on admission nutrition screen.  Current weight is 170 (77.1 kg). BMI is 25.85, classifying him as overweight.  No wounds to document. Patient reported appetite was good prior to admission, and was able to eat three meals a day.  Denied problems swallowing or any current N/V. Has been intentionally trying to lose weight; has lost approximately 5 lbs in past four weeks which is desirable given overweight status. Consumed > 75% of his meal last night on a Carbohydrate Modified Medium Calorie diet. Currently NPO, and was asking for breakfast. No nutritional interventions are warranted at this time. Please consult RD as needed.  Alger Memos, RD Pager #: [phone]  Christia Reading, Dietetic Intern Pager # [phone]

## 2011-07-07 NOTE — Consult Note (Signed)
Reason for Consult: EKG Changes Referring Physician: Jomarie Longs  PCP: Avva  Donald [name] is an 61 y.o. male.  HPI: the patient is a 61 year old Caucasian male currently being treated for pneumonia.  He has a history of diabetes mellitus type 2, hypertension,, IgA nephropathy.  His last echocardiogram was approximately 9 years ago results which are unknown. He also had a negative stress test in 2009 [facility] which apparently was negative.  Family history significant in his father who is deceased at age 74 from myocardial infarction. He happened to be a heavy smoker.  The Patient states that week ago Sunday he began having cold symptoms.  This has caused his breathing to feel "tight".  He also states his chest feeling tight and stated "It feels like a child sitting on my chest".  The patient has been treated at Dr. Vicente Males office for sinusitis.  The shortness of breath has been getting progressively worse he has noticed dyspnea on exertion particularly walking and cough.  Patient has recently been getting back into working out and approximately 2 weeks ago was working out at [employer].  At that time he did not experience any tightness  In his chest or chest pain.  He reports occasional chest palpitations at which time he can feel his heart beating "stronger" and "faster".    EKG is significant for T-wave inversion in 23 and aVF as well as V1 through 4. Heart rate 74 beats per minute QTCs 193. The EKG changes are different from previous EKG in 2008.  Cardiac enzymes have been negative.   Past Medical History  Diagnosis Date  . Diabetes mellitus   . Hypertension   . Hyperlipidemia   . Pneumonia   . Sinusitis   . Bronchitis     Past Surgical History  Procedure Date  . Cervical spine surgery   . Hernia repair     Family History  Problem Relation Age of Onset  . Coronary artery disease Father   . Diabetes type II Father     Social History:  reports that he has never smoked. He does not have  any smokeless tobacco history on file. His alcohol and drug histories not on file.  Patient works as a Emergency planning/management officer in Adult nurse. He has been married 33 years and has one male child.  Allergies:  Allergies  Allergen Reactions  . Ciprofloxacin     Shoulder cramping  . Levaquin     cramping    Medications:    . albuterol  2.5 mg Nebulization Q6H  . albuterol  5 mg Nebulization Once  . aspirin EC  325 mg Oral Daily  . azithromycin (ZITHROMAX) 500 MG IVPB  500 mg Intravenous Once  . azithromycin  500 mg Intravenous Q24H  . budesonide  0.5 mg Nebulization BID  . buPROPion  150 mg Oral Daily  . cefTRIAXone (ROCEPHIN)  IV  1 g Intravenous Once  . cefTRIAXone (ROCEPHIN)  IV  1 g Intravenous Q24H  . chlorpheniramine-HYDROcodone  5 mL Oral Once  . diltiazem  240 mg Oral Daily  . enoxaparin  40 mg Subcutaneous QHS  . fenofibrate  160 mg Oral Daily  . glimepiride  1 mg Oral QAC breakfast  . hydrochlorothiazide  12.5 mg Oral Daily  . insulin aspart  0-15 Units Subcutaneous Q4H  . insulin glargine  5 Units Subcutaneous QHS  . ipratropium  0.5 mg Nebulization Once  . loratadine  10 mg Oral Daily  . methylPREDNISolone (  SOLU-MEDROL) injection  125 mg Intravenous Once  . nebivolol  10 mg Oral QPM  . nitroGLYCERIN  0.4 mg Transdermal QHS  . olmesartan  40 mg Oral Daily  . omega-3 acid ethyl esters  1 g Oral QHS  . sodium chloride  3 mL Intravenous Q12H  . DISCONTD: insulin aspart  0-5 Units Subcutaneous QHS  . DISCONTD: insulin aspart  0-9 Units Subcutaneous TID WC  . DISCONTD: olmesartan-hydrochlorothiazide  1 tablet Oral QPM  . DISCONTD: sodium chloride  3 mL Intravenous Q12H     Results for orders placed during the hospital encounter of 07/06/11 (from the past 48 hour(s))  CBC     Status: Abnormal   Collection Time   07/06/11  6:03 PM      Component Value Range Comment   WBC 14.9 (*) 4.0 - 10.5 (K/uL)    RBC 5.44  4.22 - 5.81 (MIL/uL)    Hemoglobin 15.7   13.0 - 17.0 (g/dL)    HCT 16.1  09.6 - 04.5 (%)    MCV 84.4  78.0 - 100.0 (fL)    MCH 28.9  26.0 - 34.0 (pg)    MCHC 34.2  30.0 - 36.0 (g/dL)    RDW 40.9  81.1 - 91.4 (%)    Platelets 316  150 - 400 (K/uL)   DIFFERENTIAL     Status: Abnormal   Collection Time   07/06/11  6:03 PM      Component Value Range Comment   Neutrophils Relative 67  43 - 77 (%)    Neutro Abs 9.9 (*) 1.7 - 7.7 (K/uL)    Lymphocytes Relative 27  12 - 46 (%)    Lymphs Abs 4.0  0.7 - 4.0 (K/uL)    Monocytes Relative 6  3 - 12 (%)    Monocytes Absolute 0.8  0.1 - 1.0 (K/uL)    Eosinophils Relative 1  0 - 5 (%)    Eosinophils Absolute 0.1  0.0 - 0.7 (K/uL)    Basophils Relative 0  0 - 1 (%)    Basophils Absolute 0.0  0.0 - 0.1 (K/uL)   BASIC METABOLIC PANEL     Status: Abnormal   Collection Time   07/06/11  6:03 PM      Component Value Range Comment   Sodium 140  135 - 145 (mEq/L)    Potassium 3.3 (*) 3.5 - 5.1 (mEq/L)    Chloride 98  96 - 112 (mEq/L)    CO2 25  19 - 32 (mEq/L)    Glucose, Bld 103 (*) 70 - 99 (mg/dL)    BUN 18  6 - 23 (mg/dL)    Creatinine, Ser 7.82  0.50 - 1.35 (mg/dL)    Calcium 9.6  8.4 - 10.5 (mg/dL)    GFR calc non Af Amer >90  >90 (mL/min)    GFR calc Af Amer >90  >90 (mL/min)   CARDIAC PANEL(CRET KIN+CKTOT+MB+TROPI)     Status: Abnormal   Collection Time   07/06/11  9:16 PM      Component Value Range Comment   Total CK 165  7 - 232 (U/L)    CK, MB 4.6 (*) 0.3 - 4.0 (ng/mL)    Troponin I <0.30  <0.30 (ng/mL)    Relative Index 2.8 (*) 0.0 - 2.5    PRO B NATRIURETIC PEPTIDE     Status: Normal   Collection Time   07/06/11  9:39 PM      Component Value Range Comment  Pro B Natriuretic peptide (BNP) 47.5  0 - 125 (pg/mL)   CBC     Status: Abnormal   Collection Time   07/06/11  9:39 PM      Component Value Range Comment   WBC 14.8 (*) 4.0 - 10.5 (K/uL)    RBC 5.39  4.22 - 5.81 (MIL/uL)    Hemoglobin 16.0  13.0 - 17.0 (g/dL)    HCT 86.5  78.4 - 69.6 (%)    MCV 83.7  78.0 - 100.0  (fL)    MCH 29.7  26.0 - 34.0 (pg)    MCHC 35.5  30.0 - 36.0 (g/dL)    RDW 29.5  28.4 - 13.2 (%)    Platelets 321  150 - 400 (K/uL)   CREATININE, SERUM     Status: Normal   Collection Time   07/06/11  9:39 PM      Component Value Range Comment   Creatinine, Ser 0.65  0.50 - 1.35 (mg/dL)    GFR calc non Af Amer >90  >90 (mL/min)    GFR calc Af Amer >90  >90 (mL/min)   GLUCOSE, CAPILLARY     Status: Abnormal   Collection Time   07/06/11  9:41 PM      Component Value Range Comment   Glucose-Capillary 264 (*) 70 - 99 (mg/dL)   CARDIAC PANEL(CRET KIN+CKTOT+MB+TROPI)     Status: Abnormal   Collection Time   07/07/11  5:45 AM      Component Value Range Comment   Total CK 132  7 - 232 (U/L)    CK, MB 4.2 (*) 0.3 - 4.0 (ng/mL)    Troponin I <0.30  <0.30 (ng/mL)    Relative Index 3.2 (*) 0.0 - 2.5    COMPREHENSIVE METABOLIC PANEL     Status: Abnormal   Collection Time   07/07/11  5:45 AM      Component Value Range Comment   Sodium 136  135 - 145 (mEq/L)    Potassium 3.9  3.5 - 5.1 (mEq/L)    Chloride 95 (*) 96 - 112 (mEq/L)    CO2 28  19 - 32 (mEq/L)    Glucose, Bld 225 (*) 70 - 99 (mg/dL)    BUN 15  6 - 23 (mg/dL)    Creatinine, Ser 4.40  0.50 - 1.35 (mg/dL)    Calcium 9.5  8.4 - 10.5 (mg/dL)    Total Protein 6.8  6.0 - 8.3 (g/dL)    Albumin 3.6  3.5 - 5.2 (g/dL)    AST 24  0 - 37 (U/L)    ALT 77 (*) 0 - 53 (U/L)    Alkaline Phosphatase 34 (*) 39 - 117 (U/L)    Total Bilirubin 0.4  0.3 - 1.2 (mg/dL)    GFR calc non Af Amer >90  >90 (mL/min)    GFR calc Af Amer >90  >90 (mL/min)   CBC     Status: Abnormal   Collection Time   07/07/11  5:45 AM      Component Value Range Comment   WBC 13.2 (*) 4.0 - 10.5 (K/uL)    RBC 5.33  4.22 - 5.81 (MIL/uL)    Hemoglobin 15.1  13.0 - 17.0 (g/dL)    HCT 10.2  72.5 - 36.6 (%)    MCV 84.4  78.0 - 100.0 (fL)    MCH 28.3  26.0 - 34.0 (pg)    MCHC 33.6  30.0 - 36.0 (g/dL)    RDW 14.7  11.5 - 15.5 (%)    Platelets 348  150 - 400 (K/uL)   GLUCOSE,  CAPILLARY     Status: Abnormal   Collection Time   07/07/11  7:47 AM      Component Value Range Comment   Glucose-Capillary 202 (*) 70 - 99 (mg/dL)     Dg Chest 2 View  2/95/6213  *RADIOLOGY REPORT*  Clinical Data: Cough for 1.5 weeks question pneumonia, history hypertension and diabetes  CHEST - 2 VIEW  Comparison: 11/27/2006  Findings: Enlargement of cardiac silhouette. Mediastinal contours and pulmonary vascularity normal. Left lower lobe opacity question atelectasis versus consolidation. Underlying mass not excluded. Remaining lungs clear. No pleural effusion or pneumothorax. Prior cervical spine fusion.  IMPRESSION: Left lower lobe opacity question atelectasis versus consolidation although underlying mass is not excluded. Follow-up radiographic assessment until resolution recommended to exclude tumor.  Original Report Authenticated By: Lollie Marrow, M.D.    Review of Systems  Constitutional: Negative for diaphoresis.  HENT: Positive for congestion.   Eyes: Negative for blurred vision and double vision.  Respiratory: Positive for cough and shortness of breath (Dyspnea on exertion.).   Cardiovascular: Positive for chest pain (Chest tightness associated with cough.) and palpitations. Negative for orthopnea, leg swelling and PND.  Gastrointestinal: Negative for nausea, vomiting, abdominal pain, blood in stool and melena.  Genitourinary: Negative for dysuria and hematuria.  Neurological: Positive for headaches. Negative for dizziness and weakness.  All other systems reviewed and are negative.   Blood pressure 134/81, pulse 69, temperature 97.2 F (36.2 C), temperature source Oral, resp. rate 20, height 5\' 8"  (1.727 m), weight 77.111 kg (170 lb), SpO2 94.00%. Physical Exam  Constitutional: He is oriented to person, place, and time. He appears well-developed and well-nourished. No distress.  HENT:  Head: Normocephalic and atraumatic.  Eyes: EOM are normal. Pupils are equal, round, and  reactive to light. Left eye exhibits no discharge. No scleral icterus.  Neck: Normal range of motion. Neck supple. No JVD present.  Cardiovascular: Normal rate and regular rhythm.  Exam reveals gallop and S3. Exam reveals no friction rub.   No murmur heard. Pulses:      Radial pulses are 2+ on the right side, and 2+ on the left side.       Dorsalis pedis pulses are 2+ on the right side, and 2+ on the left side.       Posterior tibial pulses are 2+ on the right side, and 2+ on the left side.       No Carotid or femoral bruit.  Respiratory: Effort normal. He has no wheezes.       Decreased BS particularly on the left base.  GI: Soft. Bowel sounds are normal. He exhibits no distension. There is no tenderness.  Musculoskeletal: He exhibits no edema.  Lymphadenopathy:    He has no cervical adenopathy.  Neurological: He is alert and oriented to person, place, and time. He exhibits normal muscle tone.  Skin: Skin is warm and dry.  Psychiatric: He has a normal mood and affect.    Assessment/Plan: Patient Active Hospital Problem List: Community acquired pneumonia (07/06/2011) SOB (shortness of breath) (07/06/2011) Chest tightness Diabetes mellitus, type 2 (07/06/2011) HTN (hypertension) (07/06/2011) H/O primary IgA nephropathy (07/06/2011) TWave inversions on EKG  Plan: Patient is a 61 year old Caucasian male, currently being treated for pneumonia, with a history of diabetes mellitus, hypertension, hyperlipidemia, IgA nephropathy. He has new EKG changes in the form of T-wave inversions which are different from EKG  in 2008.  Troponin has been negative he has had a mildly elevated CK-MB.   I think it is reasonable to conduct a nuclear stress test in the a.m.  HAGER,BRYAN W 07/07/2011, 11:40 AM   I have seen and examined the patient along with Dwana Melena, PA.  I have reviewed the chart, notes and new data.  I agree with PA's note.  Key new complaints: chest pain is clearly associated with  coughing spells and has mostly pleuritic features Key examination changes: RRR. He has a loud S4, but may also have a faint and incomplete (systolic only) pericardial rub. Key new findings / data: WBC remains elevated; barely noticeable increase in CKMB with normal cTnI and normal total CK is of doubtful clinical value.  ECG shows clear cut anterior T wave inversion and mild ST depression, new from 2007  PLAN: Differential diagnosis is between unstable angina (maybe precipitated by pneumonia) and acute parapneumonic pericarditis in a patient with significant coronary risk factors (DM, HTN, hyperlipidemia).  A Lexiscan myoview and an echocardiogram are appropriate. Absent evidence of full blown acute coronary syndrome, coronary angiography (if necessary) should be delayed until pneumonia resolves.  Thurmon Fair, MD, [facility] [phone] 07/07/2011, 2:32 PM

## 2011-07-08 ENCOUNTER — Inpatient Hospital Stay (HOSPITAL_COMMUNITY): Payer: PRIVATE HEALTH INSURANCE

## 2011-07-08 ENCOUNTER — Other Ambulatory Visit: Payer: Self-pay

## 2011-07-08 HISTORY — PX: NM MYOVIEW LTD: HXRAD82

## 2011-07-08 LAB — BASIC METABOLIC PANEL
Calcium: 9 mg/dL (ref 8.4–10.5)
Creatinine, Ser: 0.9 mg/dL (ref 0.50–1.35)
GFR calc Af Amer: >90 mL/min (ref >90)

## 2011-07-08 LAB — CBC
MCHC: 33.1 g/dL (ref 30.0–36.0)
MCV: 86.1 fL (ref 78.0–100.0)
Platelets: 325 10*3/uL (ref 150–400)
RDW: 14.7 % (ref 11.5–15.5)
WBC: 18 10*3/uL — ABNORMAL HIGH (ref 4.0–10.5)

## 2011-07-08 LAB — GLUCOSE, CAPILLARY
Glucose-Capillary: 280 mg/dL — ABNORMAL HIGH (ref 70–99)
Glucose-Capillary: 132 mg/dL — ABNORMAL HIGH (ref 70–99)

## 2011-07-08 MED ORDER — TECHNETIUM TC 99M TETROFOSMIN IV KIT
30.0000 | PACK | Freq: Once | INTRAVENOUS | Status: AC | PRN
  Administered 2011-07-08: 30 via INTRAVENOUS

## 2011-07-08 MED ORDER — REGADENOSON 0.4 MG/5ML IV SOLN
0.4000 mg | Freq: Once | INTRAVENOUS | Status: AC
  Administered 2011-07-08: 0.4 mg via INTRAVENOUS

## 2011-07-08 MED ORDER — TECHNETIUM TC 99M TETROFOSMIN IV KIT
10.0000 | PACK | Freq: Once | INTRAVENOUS | Status: AC | PRN
  Administered 2011-07-08: 10 via INTRAVENOUS

## 2011-07-08 NOTE — Progress Notes (Signed)
Agree with the NP/PA-C note as written. Plan lexiscan myoview today.   Chrystie Nose, MD, [facility] Attending Cardiologist [facility]

## 2011-07-08 NOTE — Progress Notes (Signed)
  Echocardiogram 2D Echocardiogram has been performed.  Donald [name] 07/08/2011, 5:39 PM

## 2011-07-08 NOTE — Progress Notes (Signed)
Subjective: No chest pain  Objective: Vital signs in last 24 hours: Temp:  [97.9 F (36.6 C)] 97.9 F (36.6 C) (03/02 0500) Pulse Rate:  [65-83] 65  (03/02 0500) Resp:  [14-16] 14  (03/02 0500) BP: (113-137)/(68-82) 118/80 mmHg (03/02 0500) SpO2:  [93 %-94 %] 94 % (03/02 0500) Weight change:  Last BM Date: 07/06/11 Intake/Output from previous day: 03/01 0701 - 03/02 0700 In: 480 [P.O.:480] Out: 3 [Urine:3] Intake/Output this shift:    PE: General: Heart: Lungs: Abd: Ext:    Lab Results:  Basename 07/08/11 0600 07/07/11 0545  WBC 18.0* 13.2*  HGB 14.5 15.1  HCT 43.8 45.0  PLT 325 348   BMET  Basename 07/08/11 0600 07/07/11 0545  NA 142 136  K 3.2* 3.9  CL 99 95*  CO2 33* 28  GLUCOSE 98 225*  BUN 15 15  CREATININE 0.90 0.69  CALCIUM 9.0 9.5    Basename 07/07/11 1257 07/07/11 0545  TROPONINI <0.30 <0.30    No results found for this basename: CHOL, HDL, LDLCALC, LDLDIRECT, TRIG, CHOLHDL   No results found for this basename: HGBA1C     No results found for this basename: TSH    Hepatic Function Panel  Basename 07/07/11 0545  PROT 6.8  ALBUMIN 3.6  AST 24  ALT 77*  ALKPHOS 34*  BILITOT 0.4  BILIDIR --  IBILI --     EKG: Orders placed during the hospital encounter of 07/06/11  . EKG 12-LEAD  . EKG 12-LEAD    Studies/Results: Dg Chest 2 View  07/06/2011  *RADIOLOGY REPORT*  Clinical Data: Cough for 1.5 weeks question pneumonia, history hypertension and diabetes  CHEST - 2 VIEW  Comparison: 11/27/2006  Findings: Enlargement of cardiac silhouette. Mediastinal contours and pulmonary vascularity normal. Left lower lobe opacity question atelectasis versus consolidation. Underlying mass not excluded. Remaining lungs clear. No pleural effusion or pneumothorax. Prior cervical spine fusion.  IMPRESSION: Left lower lobe opacity question atelectasis versus consolidation although underlying mass is not excluded. Follow-up radiographic assessment until  resolution recommended to exclude tumor.  Original Report Authenticated By: Lollie Marrow, M.D.    Medications: I have reviewed the patient's current medications.    Marland Kitchen albuterol  2.5 mg Nebulization Q6H  . aspirin EC  325 mg Oral Daily  . azithromycin  500 mg Intravenous Q24H  . budesonide  0.5 mg Nebulization BID  . buPROPion  150 mg Oral Daily  . cefTRIAXone (ROCEPHIN)  IV  1 g Intravenous Q24H  . diltiazem  240 mg Oral Daily  . enoxaparin  40 mg Subcutaneous QHS  . fenofibrate  160 mg Oral Daily  . glimepiride  1 mg Oral QAC breakfast  . hydrochlorothiazide  12.5 mg Oral Daily  . insulin aspart  0-15 Units Subcutaneous Q4H  . loratadine  10 mg Oral Daily  . nebivolol  10 mg Oral QPM  . olmesartan  40 mg Oral Daily  . omega-3 acid ethyl esters  1 g Oral QHS  . regadenoson  0.4 mg Intravenous Once  . sodium chloride  3 mL Intravenous Q12H  . DISCONTD: insulin aspart  0-5 Units Subcutaneous QHS  . DISCONTD: insulin aspart  0-9 Units Subcutaneous TID WC  . DISCONTD: insulin glargine  5 Units Subcutaneous QHS  . DISCONTD: nitroGLYCERIN  0.4 mg Transdermal QHS  . DISCONTD: sodium chloride  3 mL Intravenous Q12H    Assessment/Plan: Patient Active Problem List  Diagnoses  . Community acquired pneumonia  . SOB (shortness of  breath)  . Diabetes mellitus, type 2  . HTN (hypertension)  . H/O primary IgA nephropathy  Chest pain  PLAN:   LOS: 2 days   Luken Shadowens R 07/08/2011, 9:18 AM

## 2011-07-08 NOTE — Progress Notes (Signed)
Addendum to progress note:  General: alert and oriented pleasant affect Heart:S1S2 RRR, no obvious murmurs Lungs:clear without rales, rhonchi or wheezes Abd:+ BS, soft non tender Ext:no edema  Lexiscan myoview completed without complications.

## 2011-07-08 NOTE — Progress Notes (Signed)
Subjective: Patient states that he feels a little better today.  Objective: Weight change:  No intake or output data in the 24 hours ending 07/07/11 1134  Filed Vitals:   07/07/11 1001  BP: 134/81  Pulse:   Temp:   Resp:    General: Patient appears his stated age. HEENT: Head normocephalic atraumatic. Cardiovascular: Regular rate rhythm. Lungs: Clear to auscultation bilaterally. Abdomen: Positive bowel sounds. Extremities: No edema  Lab Results: reviewed  Studies/Results: Dg Chest 2 View  07/06/2011  *RADIOLOGY REPORT*  Clinical Data: Cough for 1.5 weeks question pneumonia, history hypertension and diabetes  CHEST - 2 VIEW  Comparison: 11/27/2006  Findings: Enlargement of cardiac silhouette. Mediastinal contours and pulmonary vascularity normal. Left lower lobe opacity question atelectasis versus consolidation. Underlying mass not excluded. Remaining lungs clear. No pleural effusion or pneumothorax. Prior cervical spine fusion.  IMPRESSION: Left lower lobe opacity question atelectasis versus consolidation although underlying mass is not excluded. Follow-up radiographic assessment until resolution recommended to exclude tumor.  Original Report Authenticated By: Lollie Marrow, M.D.   Medications: Scheduled Meds:   . albuterol  2.5 mg Nebulization Q6H  . albuterol  5 mg Nebulization Once  . aspirin EC  325 mg Oral Daily  . azithromycin (ZITHROMAX) 500 MG IVPB  500 mg Intravenous Once  . azithromycin  500 mg Intravenous Q24H  . budesonide  0.5 mg Nebulization BID  . buPROPion  150 mg Oral Daily  . cefTRIAXone (ROCEPHIN)  IV  1 g Intravenous Once  . cefTRIAXone (ROCEPHIN)  IV  1 g Intravenous Q24H  . chlorpheniramine-HYDROcodone  5 mL Oral Once  . diltiazem  240 mg Oral Daily  . enoxaparin  40 mg Subcutaneous QHS  . fenofibrate  160 mg Oral Daily  . glimepiride  1 mg Oral QAC breakfast  . hydrochlorothiazide  12.5 mg Oral Daily  . insulin aspart  0-5 Units Subcutaneous QHS    . insulin aspart  0-9 Units Subcutaneous TID WC  . ipratropium  0.5 mg Nebulization Once  . loratadine  10 mg Oral Daily  . methylPREDNISolone (SOLU-MEDROL) injection  125 mg Intravenous Once  . nebivolol  10 mg Oral QPM  . nitroGLYCERIN  0.4 mg Transdermal QHS  . olmesartan  40 mg Oral Daily  . omega-3 acid ethyl esters  1 g Oral QHS  . sodium chloride  3 mL Intravenous Q12H  . DISCONTD: olmesartan-hydrochlorothiazide  1 tablet Oral QPM  . DISCONTD: sodium chloride  3 mL Intravenous Q12H   Continuous Infusions:  PRN Meds:.acetaminophen, acetaminophen, albuterol, fluticasone, morphine injection, nitroGLYCERIN, ondansetron (ZOFRAN) IV, ondansetron  Assessment/Plan: Community acquired pneumonia (07/06/2011) Continue IV Rocephin and Zithromax and breathing treatments, WBC still up  SOB (shortness of breath) (07/06/2011)/chest tightness/EKG changes  S/p lexiscan myoview, negative for inducible ischemia, EF 50%  Diabetes mellitus, type 2 (07/06/2011) Continue home medications as well as sliding scale insulin.    HTN (hypertension) (07/06/2011)  Continue his diltiazem, hydrochlorothiazide, nebivolol and olmesartan   H/O primary IgA nephropathy (07/06/2011)  stable  Dispo:possibly home tomorrow  LOS: 2 days   Harrol Novello 07/08/2011, 2:32 PM

## 2011-07-09 ENCOUNTER — Other Ambulatory Visit: Payer: Self-pay

## 2011-07-09 LAB — CBC
HCT: 43.6 % (ref 39.0–52.0)
Hemoglobin: 14.9 g/dL (ref 13.0–17.0)
RDW: 14.8 % (ref 11.5–15.5)
WBC: 14.5 10*3/uL — ABNORMAL HIGH (ref 4.0–10.5)

## 2011-07-09 LAB — GLUCOSE, CAPILLARY: Glucose-Capillary: 137 mg/dL — ABNORMAL HIGH (ref 70–99)

## 2011-07-09 MED ORDER — ALBUTEROL SULFATE (5 MG/ML) 0.5% IN NEBU: 2.5000 mg | INHALATION_SOLUTION | Freq: Four times a day (QID) | RESPIRATORY_TRACT | Status: DC

## 2011-07-09 MED ORDER — PREDNISONE (PAK) 10 MG PO TABS: 20.0000 mg | ORAL_TABLET | Freq: Every day | ORAL | Status: AC

## 2011-07-09 MED ORDER — PREDNISONE 50 MG PO TABS
60.0000 mg | ORAL_TABLET | Freq: Every day | ORAL | Status: DC
  Administered 2011-07-09: 60 mg via ORAL
  Filled 2011-07-09 (×3): qty 1

## 2011-07-09 MED ORDER — AMOXICILLIN-POT CLAVULANATE 875-125 MG PO TABS: 1.0000 | ORAL_TABLET | Freq: Two times a day (BID) | ORAL | Status: AC

## 2011-07-09 NOTE — Progress Notes (Signed)
Patient ambulated in hallway without oxygen.  Oxygen saturation at the beginning of ambulation 91%, during ambulation 89-90% and at the end of ambulation oxygen saturation dropped to 87% RA.  Patient C/O of mild short of breath.

## 2011-07-09 NOTE — Progress Notes (Signed)
[facility]  DAILY PROGRESS NOTE   Subjective:  Chest pain has resolved. Still feels some tightness in his lungs.  Objective:  Temp:  [98 F (36.7 C)-98.6 F (37 C)] 98.6 F (37 C) (03/03 0600) Pulse Rate:  [61-95] 61  (03/03 0600) Resp:  [16] 16  (03/03 0600) BP: (104-140)/(72-105) 104/72 mmHg (03/03 0600) SpO2:  [91 %-95 %] 93 % (03/03 0600) Weight change:   Intake/Output from previous day: 03/02 0701 - 03/03 0700 In: 3 [I.V.:3] Out: -   Intake/Output from this shift:    Medications: Current Facility-Administered Medications  Medication Dose Route Frequency Provider Last Rate Last Dose  . acetaminophen (TYLENOL) tablet 650 mg  650 mg Oral Q6H PRN Eduard Clos, MD   650 mg at 07/07/11 1243   Or  . acetaminophen (TYLENOL) suppository 650 mg  650 mg Rectal Q6H PRN Eduard Clos, MD      . albuterol (PROVENTIL) (5 MG/ML) 0.5% nebulizer solution 2.5 mg  2.5 mg Nebulization Q6H Eduard Clos, MD   2.5 mg at 07/07/11 1405  . albuterol (PROVENTIL) (5 MG/ML) 0.5% nebulizer solution 2.5 mg  2.5 mg Nebulization Q2H PRN Eduard Clos, MD   2.5 mg at 07/08/11 1928  . aspirin EC tablet 325 mg  325 mg Oral Daily Eduard Clos, MD   325 mg at 07/08/11 1055  . azithromycin (ZITHROMAX) 500 mg in dextrose 5 % 250 mL IVPB  500 mg Intravenous Q24H Eduard Clos, MD   500 mg at 07/08/11 2347  . budesonide (PULMICORT) nebulizer solution 0.5 mg  0.5 mg Nebulization BID Eduard Clos, MD   0.5 mg at 07/07/11 0925  . buPROPion (WELLBUTRIN XL) 24 hr tablet 150 mg  150 mg Oral Daily Eduard Clos, MD   150 mg at 07/08/11 1047  . cefTRIAXone (ROCEPHIN) 1 g in dextrose 5 % 50 mL IVPB  1 g Intravenous Q24H Eduard Clos, MD   1 g at 07/08/11 2230  . chlorpheniramine-HYDROcodone (TUSSIONEX) 10-8 MG/5ML suspension 5 mL  5 mL Oral Q12H PRN Novlet Wandra Mannan, MD   5 mL at 07/08/11 1943  . diltiazem (CARDIZEM CD)  24 hr capsule 240 mg  240 mg Oral Daily Eduard Clos, MD   240 mg at 07/08/11 1048  . enoxaparin (LOVENOX) injection 40 mg  40 mg Subcutaneous QHS Eduard Clos, MD   40 mg at 07/08/11 2230  . fenofibrate tablet 160 mg  160 mg Oral Daily Eduard Clos, MD   160 mg at 07/08/11 1047  . fluticasone (FLONASE) 50 MCG/ACT nasal spray 2 spray  2 spray Each Nare Daily PRN Eduard Clos, MD      . glimepiride (AMARYL) tablet 1 mg  1 mg Oral QAC breakfast Eduard Clos, MD   1 mg at 07/08/11 1046  . hydrochlorothiazide (MICROZIDE) capsule 12.5 mg  12.5 mg Oral Daily Zannie Cove, MD   12.5 mg at 07/08/11 1047  . HYDROcodone-acetaminophen (NORCO) 5-325 MG per tablet 1 tablet  1 tablet Oral Q4H PRN Novlet Wandra Mannan, MD      . insulin aspart (novoLOG) injection 0-15 Units  0-15 Units Subcutaneous Q4H Novlet Wandra Mannan, MD   8 Units at 07/08/11 1252  . loratadine (CLARITIN) tablet 10 mg  10 mg Oral Daily Eduard Clos, MD   10 mg at 07/08/11 1047  . morphine 2 MG/ML injection 2-4 mg  2-4 mg Intravenous  Q4H PRN Novlet Wandra Mannan, MD      . nebivolol (BYSTOLIC) tablet 10 mg  10 mg Oral QPM Eduard Clos, MD   10 mg at 07/08/11 1757  . nitroGLYCERIN (NITROSTAT) SL tablet 0.4 mg  0.4 mg Sublingual Q5 Min x 3 PRN Eduard Clos, MD      . olmesartan (BENICAR) tablet 40 mg  40 mg Oral Daily Zannie Cove, MD   40 mg at 07/08/11 1047  . omega-3 acid ethyl esters (LOVAZA) capsule 1 g  1 g Oral QHS Eduard Clos, MD   1 g at 07/08/11 2230  . ondansetron (ZOFRAN) tablet 4 mg  4 mg Oral Q6H PRN Eduard Clos, MD       Or  . ondansetron [facility]) injection 4 mg  4 mg Intravenous Q6H PRN Eduard Clos, MD      . regadenoson (LEXISCAN) injection SOLN 0.4 mg  0.4 mg Intravenous Once [name] Croitoru, MD   0.4 mg at 07/08/11 0900  . sodium chloride 0.9 % injection 3 mL  3 mL Intravenous Q12H Eduard Clos, MD   3 mL at 07/08/11 2230    . technetium tetrofosmin (TC-MYOVIEW) injection 30 milli Curie  30 milli Curie Intravenous Once PRN Medication Radiologist, MD   30 milli Curie at 07/08/11 0930    Physical Exam: General appearance: alert and no distress Neck: no adenopathy, no carotid bruit, no JVD, supple, symmetrical, trachea midline and thyroid not enlarged, symmetric, no tenderness/mass/nodules Lungs: dullness to percussion LLL Heart: regular rate and rhythm, S1, S2 normal, no murmur, click, rub or gallop Abdomen: soft, non-tender; bowel sounds normal; no masses,  no organomegaly Extremities: extremities normal, atraumatic, no cyanosis or edema  Lab Results: Results for orders placed during the hospital encounter of 07/06/11 (from the past 48 hour(s))  GLUCOSE, CAPILLARY     Status: Abnormal   Collection Time   07/07/11 11:44 AM      Component Value Range Comment   Glucose-Capillary 196 (*) 70 - 99 (mg/dL)   CARDIAC PANEL(CRET KIN+CKTOT+MB+TROPI)     Status: Abnormal   Collection Time   07/07/11 12:57 PM      Component Value Range Comment   Total CK 104  7 - 232 (U/L)    CK, MB 3.7  0.3 - 4.0 (ng/mL)    Troponin I <0.30  <0.30 (ng/mL)    Relative Index 3.6 (*) 0.0 - 2.5    GLUCOSE, CAPILLARY     Status: Abnormal   Collection Time   07/07/11  5:02 PM      Component Value Range Comment   Glucose-Capillary 152 (*) 70 - 99 (mg/dL)   GLUCOSE, CAPILLARY     Status: Abnormal   Collection Time   07/07/11  8:01 PM      Component Value Range Comment   Glucose-Capillary 243 (*) 70 - 99 (mg/dL)   GLUCOSE, CAPILLARY     Status: Abnormal   Collection Time   07/07/11 11:56 PM      Component Value Range Comment   Glucose-Capillary 315 (*) 70 - 99 (mg/dL)   GLUCOSE, CAPILLARY     Status: Abnormal   Collection Time   07/08/11  4:39 AM      Component Value Range Comment   Glucose-Capillary 129 (*) 70 - 99 (mg/dL)   BASIC METABOLIC PANEL     Status: Abnormal   Collection Time   07/08/11  6:00 AM      Component Value Range  Comment   Sodium 142  135 - 145 (mEq/L)    Potassium 3.2 (*) 3.5 - 5.1 (mEq/L)    Chloride 99  96 - 112 (mEq/L)    CO2 33 (*) 19 - 32 (mEq/L)    Glucose, Bld 98  70 - 99 (mg/dL)    BUN 15  6 - 23 (mg/dL)    Creatinine, Ser 8.11  0.50 - 1.35 (mg/dL)    Calcium 9.0  8.4 - 10.5 (mg/dL)    GFR calc non Af Amer >90  >90 (mL/min)    GFR calc Af Amer >90  >90 (mL/min)   CBC     Status: Abnormal   Collection Time   07/08/11  6:00 AM      Component Value Range Comment   WBC 18.0 (*) 4.0 - 10.5 (K/uL)    RBC 5.09  4.22 - 5.81 (MIL/uL)    Hemoglobin 14.5  13.0 - 17.0 (g/dL)    HCT 91.4  78.2 - 95.6 (%)    MCV 86.1  78.0 - 100.0 (fL)    MCH 28.5  26.0 - 34.0 (pg)    MCHC 33.1  30.0 - 36.0 (g/dL)    RDW 21.3  08.6 - 57.8 (%)    Platelets 325  150 - 400 (K/uL)   GLUCOSE, CAPILLARY     Status: Abnormal   Collection Time   07/08/11  7:59 AM      Component Value Range Comment   Glucose-Capillary 104 (*) 70 - 99 (mg/dL)   GLUCOSE, CAPILLARY     Status: Abnormal   Collection Time   07/08/11 11:31 AM      Component Value Range Comment   Glucose-Capillary 280 (*) 70 - 99 (mg/dL)   GLUCOSE, CAPILLARY     Status: Normal   Collection Time   07/08/11  5:11 PM      Component Value Range Comment   Glucose-Capillary 90  70 - 99 (mg/dL)   GLUCOSE, CAPILLARY     Status: Abnormal   Collection Time   07/08/11  8:42 PM      Component Value Range Comment   Glucose-Capillary 132 (*) 70 - 99 (mg/dL)   CBC     Status: Abnormal   Collection Time   07/09/11  6:10 AM      Component Value Range Comment   WBC 14.5 (*) 4.0 - 10.5 (K/uL)    RBC 5.08  4.22 - 5.81 (MIL/uL)    Hemoglobin 14.9  13.0 - 17.0 (g/dL)    HCT 46.9  62.9 - 52.8 (%)    MCV 85.8  78.0 - 100.0 (fL)    MCH 29.3  26.0 - 34.0 (pg)    MCHC 34.2  30.0 - 36.0 (g/dL)    RDW 41.3  24.4 - 01.0 (%)    Platelets 291  150 - 400 (K/uL)   GLUCOSE, CAPILLARY     Status: Abnormal   Collection Time   07/09/11  8:07 AM      Component Value Range Comment    Glucose-Capillary 117 (*) 70 - 99 (mg/dL)     Imaging: Nm Myocar Multi W/spect W/wall Motion / Ef  07/08/2011  *RADIOLOGY REPORT*  Clinical Data:  61 year old male with chest pain and type 2 diabetes.  MYOCARDIAL IMAGING WITH SPECT (REST AND PHARMACOLOGIC-STRESS) GATED LEFT VENTRICULAR WALL MOTION STUDY LEFT VENTRICULAR EJECTION FRACTION  Technique:  Resting myocardial SPECT imaging was initially performed after intravenous administration of radiopharmaceutical. Myocardial SPECT was subsequently performed after additional  radiopharmaceutical injection during pharmacologic-stress supervised by the Cardiology staff.  Quantitative gated imaging was also performed to evaluate left ventricular wall motion, and estimate left ventricular ejection fraction.  Radiopharmaceutical:  Tc-87m Myoview at rest and during stress.  Comparison: none  Findings:  Technique: Study is adequate.  Perfusion:  There are no relative decreased counts on stress or rest to suggest reversible ischemia or infarction.  Wall motion:  No focal wall motion abnormality. Left ventricle is dilated on rest and stress.  Mild septal hypokinesia.  Left ventricular ejection fraction:  Calculated left ventricular ejection fraction =  50%  IMPRESSION:  1.  No reversible ischemia or infarction. 2.  Mild left ventricular dilatation.  Mild septal hypokinesia.  3.  Left ventricular ejection fraction equal 50%  Original Report Authenticated By: Genevive Bi, M.D.    Assessment:  1. Principal Problem: 2.  *Community acquired pneumonia 3. Active Problems: 4.  SOB (shortness of breath) 5.  Diabetes mellitus, type 2 6.  HTN (hypertension) 7.  H/O primary IgA nephropathy 8. Chest pain/pleurisy  Plan:  1. Nuclear stress test negative for ischemia however LVEF is low normal at 50%. Echo also shows mild diffuse hypokinesis without regional variation and EF of 50-55%.  Unclear etiology of this, may be related to infection or hypertension.  Would recommend repeat echo in 6 months and outpatient follow with Dr. Royann Shivers. WBC count is trending down. May be appropriate to switch to po antibiotics and anticipate discharge. We will sign off.  Call with questions.  Time Spent Directly with Patient:  15 minutes  Length of Stay:  LOS: 3 days   Chrystie Nose, MD, [facility] Attending Cardiologist [facility]  Lysa Livengood C 07/09/2011, 8:48 AM

## 2011-07-10 LAB — CBC
HCT: 45.1 % (ref 39.0–52.0)
Hemoglobin: 15.5 g/dL (ref 13.0–17.0)
MCH: 29.3 pg (ref 26.0–34.0)
MCHC: 34.4 g/dL (ref 30.0–36.0)
MCV: 85.3 fL (ref 78.0–100.0)
RBC: 5.29 MIL/uL (ref 4.22–5.81)

## 2011-07-10 MED ORDER — GLIMEPIRIDE 2 MG PO TABS
2.0000 mg | ORAL_TABLET | Freq: Every day | ORAL | Status: DC
  Filled 2011-07-10: qty 1

## 2011-07-10 MED ORDER — METHYLPREDNISOLONE SODIUM SUCC 125 MG IJ SOLR
80.0000 mg | Freq: Four times a day (QID) | INTRAMUSCULAR | Status: DC
  Administered 2011-07-10: 80 mg via INTRAVENOUS
  Filled 2011-07-10: qty 1.28

## 2011-07-10 MED FILL — Regadenoson IV Inj 0.4 MG/5ML (0.08 MG/ML): INTRAVENOUS | Qty: 5 | Status: AC

## 2011-07-10 NOTE — Progress Notes (Signed)
DC orders received.  Patient stable with no S/S of distress.  Discharge and medication instructions reviewed with patient and questions answered.  Patient DC home. Donald [name]

## 2011-07-10 NOTE — Discharge Summary (Signed)
Physician Discharge Summary  Patient ID: Donald [name] MRN: [id number] DOB/AGE: Aug 03, 1950 61 y.o.  Admit date: 07/06/2011 Discharge date: 07/10/2011  Primary Care Physician:  Hoyle Sauer, MD, MD  Discharge Diagnoses:   1. Community acquired pneumonia 2. Reactive airway disease/ Asthma flare 3. Chest tightness 4. Mild systolic dysfunction 5. diabetes mellitus type 2 6. history of primary IgA nephropathy 7. hypertension   Medication List  As of 07/10/2011  1:19 PM   STOP taking these medications         azithromycin 250 MG tablet         TAKE these medications         acetaminophen 500 MG tablet   Commonly known as: TYLENOL   Take 500 mg by mouth every 6 (six) hours as needed. For pain/ headache      albuterol 108 (90 BASE) MCG/ACT inhaler   Commonly known as: PROVENTIL HFA;VENTOLIN HFA   Inhale 2 puffs into the lungs every 4 (four) hours as needed. For shortness of breath      amoxicillin-clavulanate 875-125 MG per tablet   Commonly known as: AUGMENTIN   Take 1 tablet by mouth 2 (two) times daily. For 8 days      aspirin EC 81 MG tablet   Take 81 mg by mouth daily.      budesonide-formoterol 160-4.5 MCG/ACT inhaler   Commonly known as: SYMBICORT   Inhale 2 puffs into the lungs 2 (two) times daily as needed. For wheezing      buPROPion 150 MG 24 hr tablet   Commonly known as: WELLBUTRIN XL   Take 150 mg by mouth daily.      chlorpheniramine-HYDROcodone 10-8 MG/5ML Lqcr   Commonly known as: TUSSIONEX   Take 5 mLs by mouth every 12 (twelve) hours as needed. For cough      diltiazem 240 MG 24 hr capsule   Commonly known as: CARDIZEM CD   Take 240 mg by mouth daily.      fenofibrate 160 MG tablet   Take 160 mg by mouth daily.      fexofenadine 180 MG tablet   Commonly known as: ALLEGRA   Take 180 mg by mouth daily as needed. For allergies      fluticasone 50 MCG/ACT nasal spray   Commonly known as: FLONASE   Place 2 sprays into the nose daily as  needed. Allergies      glimepiride 1 MG tablet   Commonly known as: AMARYL   Take 1 mg by mouth daily before breakfast.      metFORMIN 1000 MG tablet   Commonly known as: GLUCOPHAGE   Take 1,000 mg by mouth 2 (two) times daily with a meal.      nebivolol 10 MG tablet   Commonly known as: BYSTOLIC   Take 10 mg by mouth every evening.      olmesartan-hydrochlorothiazide 40-12.5 MG per tablet   Commonly known as: BENICAR HCT   Take 1 tablet by mouth every evening.      omega-3 acid ethyl esters 1 G capsule   Commonly known as: LOVAZA   Take 1 g by mouth daily.      predniSONE 10 MG tablet   Commonly known as: STERAPRED UNI-PAK   Take 2 tablets (20 mg total) by mouth daily. Take 2 tabs for 2 days, then 1 tab for 2 days then 1/2 tab for 2 days then STOP             Disposition  and Follow-up:  1. Dr.Avva in 1 week 2. Dr. Royann Shivers in 1 month  Consults: Dr.Croitoru with [facility]   Significant Diagnostic Studies:  Dg Chest 2 View 07/06/2011   IMPRESSION: Left lower lobe opacity question atelectasis versus consolidation although underlying mass is not excluded. Follow-up radiographic assessment until resolution recommended to exclude tumor.  Original Report Authenticated By: Lollie Marrow, M.D.    Brief H and P: [name] is a 61 year old male history of diabetes hypertension IgA nephropathy in questionable history of asthma presented to the hospital with shortness of breath, this is associated with chest tightness and heaviness-like sensation in the middle of his chest which was worse with cough upon evaluation in the emergency room he was noted to have left-sided consolidation versus atelectasis and an EGD with diffuse T-wave changes.  Hospital Course:  1.Community acquired pneumonia with reactive airway disease originally started on IV Rocephin and azithromycin, nebulizations with modest clinical improvement. Subsequently treated with prednisone and IV  steroids. We suspect the pneumonia potentially exacerbated his asthma, he is being discharged home on an oral prednisone taper along with Augmentin, given his quinolone allergy and advised to continue his albuterol inhaler and Symbicort. He is advised to followup with his primary care physician in one week, also require a repeat chest x-ray in the near future for radiographic assessment until resolution. He was ambulated prior to discharge and did not require oxygen at rest or with activity at the time of discharge. 2. chest tightness with EKG changes: As ruled out for MI based on 3 sets of cardiac markers, given his abnormal EKG and multiple cardiovascular risk factors underwent a likely scan Myoview which was negative for inducible ischemia however she is mildly decreased LV EF of 50%. He is being followed by [facility] heart [facility] vascular through the this hospitalization and is recommended to have a followup echo in a couple of months. He will be following up with Dr. Royann Shivers.   Time spent on Discharge: Signed: Broderick Fonseca [facility]  07/10/2011, 1:19 PM

## 2011-07-10 NOTE — Progress Notes (Addendum)
Subjective: Breathing ok, some chest tightness with activity  Objective: Weight change:  No intake or output data in the 24 hours ending 07/07/11 1134  Filed Vitals:   07/07/11 1001  BP: 134/81  Pulse:   Temp:   Resp:    General: Patient appears his stated age. HEENT: Head normocephalic atraumatic. Cardiovascular: Regular rate rhythm. Lungs: Clear to auscultation bilaterally. Abdomen: Positive bowel sounds. Extremities: No edema  Lab Results: reviewed  Studies/Results: Dg Chest 2 View  07/06/2011  *RADIOLOGY REPORT*  Clinical Data: Cough for 1.5 weeks question pneumonia, history hypertension and diabetes  CHEST - 2 VIEW  Comparison: 11/27/2006  Findings: Enlargement of cardiac silhouette. Mediastinal contours and pulmonary vascularity normal. Left lower lobe opacity question atelectasis versus consolidation. Underlying mass not excluded. Remaining lungs clear. No pleural effusion or pneumothorax. Prior cervical spine fusion.  IMPRESSION: Left lower lobe opacity question atelectasis versus consolidation although underlying mass is not excluded. Follow-up radiographic assessment until resolution recommended to exclude tumor.  Original Report Authenticated By: Lollie Marrow, M.D.   Medications: Scheduled Meds:   . albuterol  2.5 mg Nebulization Q6H  . albuterol  5 mg Nebulization Once  . aspirin EC  325 mg Oral Daily  . azithromycin (ZITHROMAX) 500 MG IVPB  500 mg Intravenous Once  . azithromycin  500 mg Intravenous Q24H  . budesonide  0.5 mg Nebulization BID  . buPROPion  150 mg Oral Daily  . cefTRIAXone (ROCEPHIN)  IV  1 g Intravenous Once  . cefTRIAXone (ROCEPHIN)  IV  1 g Intravenous Q24H  . chlorpheniramine-HYDROcodone  5 mL Oral Once  . diltiazem  240 mg Oral Daily  . enoxaparin  40 mg Subcutaneous QHS  . fenofibrate  160 mg Oral Daily  . glimepiride  1 mg Oral QAC breakfast  . hydrochlorothiazide  12.5 mg Oral Daily  . insulin aspart  0-5 Units Subcutaneous QHS  .  insulin aspart  0-9 Units Subcutaneous TID WC  . ipratropium  0.5 mg Nebulization Once  . loratadine  10 mg Oral Daily  . methylPREDNISolone (SOLU-MEDROL) injection  125 mg Intravenous Once  . nebivolol  10 mg Oral QPM  . nitroGLYCERIN  0.4 mg Transdermal QHS  . olmesartan  40 mg Oral Daily  . omega-3 acid ethyl esters  1 g Oral QHS  . sodium chloride  3 mL Intravenous Q12H  . DISCONTD: olmesartan-hydrochlorothiazide  1 tablet Oral QPM  . DISCONTD: sodium chloride  3 mL Intravenous Q12H   Continuous Infusions:  PRN Meds:.acetaminophen, acetaminophen, albuterol, fluticasone, morphine injection, nitroGLYCERIN, ondansetron (ZOFRAN) IV, ondansetron  Assessment/Plan: Community acquired pneumonia (07/06/2011) Continue IV Rocephin and Zithromax and breathing treatments, WBC still up  SOB (shortness of breath) (07/06/2011)/chest tightness/EKG changes  S/p lexiscan myoview, negative for inducible ischemia, EF 50% Reactive airway dz from pneumonia, add prednisone, nebs  Diabetes mellitus, type 2 (07/06/2011) Continue home medications as well as sliding scale insulin.    HTN (hypertension) (07/06/2011)  Continue his diltiazem, hydrochlorothiazide, nebivolol and olmesartan   H/O primary IgA nephropathy (07/06/2011)  stable  Dispo:possibly later today or in am  LOS: 4 days   Donald [name] 07/10/2011, 8:17 AM

## 2011-08-17 ENCOUNTER — Encounter: Payer: Self-pay | Admitting: Pulmonary Disease

## 2011-08-18 ENCOUNTER — Encounter: Payer: Self-pay | Admitting: Pulmonary Disease

## 2011-08-18 ENCOUNTER — Ambulatory Visit (INDEPENDENT_AMBULATORY_CARE_PROVIDER_SITE_OTHER): Payer: PRIVATE HEALTH INSURANCE | Admitting: Pulmonary Disease

## 2011-08-18 VITALS — BP 140/90 | HR 79 | Temp 98.3°F | Ht 68.0 in | Wt 176.2 lb

## 2011-08-18 DIAGNOSIS — R0902 Hypoxemia: Secondary | ICD-10-CM

## 2011-08-18 DIAGNOSIS — G4734 Idiopathic sleep related nonobstructive alveolar hypoventilation: Secondary | ICD-10-CM

## 2011-08-18 DIAGNOSIS — R0602 Shortness of breath: Secondary | ICD-10-CM

## 2011-08-18 NOTE — Assessment & Plan Note (Signed)
The pt has significant nocturnal desaturation of unknown chronicity.  Will need to consider underlying heart disease, lung disease, hypoventilation, and sleep apnea.  Also need to consider underlying neuromuscular disease.  However, he also desats in the office today with brisk walking, which raises the likelihood of cardiopulmonary disease.  He has no history to suggest TE disease, and asthma does not cause this with clear lung fields.  Would also consider whether he may have some type of shunting??  Will start with full pfts, and will also review recent echo.

## 2011-08-18 NOTE — Patient Instructions (Signed)
Will set up for breathing studies, and see you back the same day to review with you. No change in breathing medications for now.  Will get echo and old breathing studies to review. Stay on oxygen at night.

## 2011-08-18 NOTE — Progress Notes (Signed)
Subjective:    Patient ID: Donald [name], male    DOB: 04-Aug-1950, 61 y.o.   MRN: [id number]  HPI Patient is a 61 year old male who I've been asked to see for nocturnal hypoxemia.  He has a very complicated history, and was recently in the hospital in February for presumed pneumonia.  He presented with fever, cough, and possible bronchospasm, but denied having purulent mucus.  He was treated with antibiotics and prednisone, and ultimately improved.  The patient states that it was a very slow process, and feels better now than he has in the last 2 months.  He has had a followup chest x-ray last month which shows totally clear lung fields.  The patient also had a stress test while in the hospital that showed questionable wall motion abnormalities, a low normal ejection fraction, but no reversible ischemia.  He has subsequently had an echocardiogram that is not available, and the patient tells me that it was "okay".  The patient tells me that he has had asthma since his 97s, but is unsure if this was ever documented with pulmonary function studies.  He had PFTs 5-7 years ago that we are trying to locate.  4 years, the patient has been on symbicort, but takes it only as needed.  He describes issues with his asthma only when he has chest or sinus infections.  He denies any issues with cold air, strong odors, or allergies.  The patient states that his worsening symptoms last for 4-6 weeks after the infection has cleared.  At baseline, the patient has dyspnea with moderate to heavy exertion, but since his hospitalization it occurs with mild to moderate activities.  He does have a history of reflux disease, but has done well on his medication.  He recently underwent overnight oximetry this month where he was found to have a low sat of 82%, and spent 8 hours less than or equal to 88%.  The patient currently sleeps in a recliner for the last 2 months, because he feels more short of breath when he lies flat.  The  patient states his wife comments on snoring during the night on occasion, but has never mentioned an abnormal breathing pattern during sleep.  The patient does not feel rested in the mornings and has frequent awakenings, but he blames this on musculoskeletal discomfort and sinus issues.  He does note sleep pressured during the day with periods of inactivity, and can doze in the evenings while watching television.   Review of Systems  Constitutional: Positive for unexpected weight change. Negative for fever.  HENT: Positive for congestion. Negative for ear pain, nosebleeds, sore throat, rhinorrhea, sneezing, trouble swallowing, dental problem, postnasal drip and sinus pressure.   Eyes: Negative for redness and itching.  Respiratory: Positive for cough and shortness of breath. Negative for chest tightness and wheezing.   Cardiovascular: Positive for palpitations. Negative for leg swelling.  Gastrointestinal: Negative for nausea and vomiting.  Genitourinary: Negative for dysuria.  Musculoskeletal: Negative for joint swelling.  Skin: Negative for rash.  Neurological: Negative for headaches.  Hematological: Does not bruise/bleed easily.  Psychiatric/Behavioral: Negative for dysphoric mood. The patient is not nervous/anxious.        Objective:   Physical Exam Constitutional:  Overweight male, no acute distress  HENT:  Nares patent without discharge  Oropharynx without exudate, palate and uvula are mildly elongated.  Eyes:  Perrla, eomi, no scleral icterus  Neck:  No JVD, no TMG  Cardiovascular:  Normal rate, regular rhythm, no  rubs or gallops.  No murmurs          Pulmonary :  Normal breath sounds, no stridor or respiratory distress   No rales, rhonchi, or wheezing  Abdominal:  Soft, nondistended, bowel sounds present.  No tenderness noted.  +centripetal obesity  Musculoskeletal:  Tight boots, stays he has mild edema  Lymph Nodes:  No cervical lymphadenopathy noted  Skin:  No  cyanosis noted  Neurologic:  Alert, appropriate, moves all 4 extremities without obvious deficit.         Assessment & Plan:

## 2011-08-18 NOTE — Assessment & Plan Note (Signed)
It remains unclear if the patient has any type of underlying lung disease.  I also wonder whether he really has asthma, and we'll try to document this with pulmonary function studies.  He has long periods of time without breathing issues on no maintenance medication, and only develops breathing issues after sinopulmonary infections.  This sounds more like reactive airways rather than chronic asthma.

## 2011-08-25 ENCOUNTER — Ambulatory Visit (INDEPENDENT_AMBULATORY_CARE_PROVIDER_SITE_OTHER): Payer: PRIVATE HEALTH INSURANCE | Admitting: Pulmonary Disease

## 2011-08-25 ENCOUNTER — Encounter: Payer: Self-pay | Admitting: Pulmonary Disease

## 2011-08-25 VITALS — BP 130/94 | HR 66 | Temp 98.1°F | Ht 68.0 in | Wt 175.0 lb

## 2011-08-25 DIAGNOSIS — R0602 Shortness of breath: Secondary | ICD-10-CM

## 2011-08-25 DIAGNOSIS — G4734 Idiopathic sleep related nonobstructive alveolar hypoventilation: Secondary | ICD-10-CM

## 2011-08-25 DIAGNOSIS — R0902 Hypoxemia: Secondary | ICD-10-CM

## 2011-08-25 LAB — PULMONARY FUNCTION TEST

## 2011-08-25 NOTE — Progress Notes (Signed)
PFT done today. 

## 2011-08-25 NOTE — Assessment & Plan Note (Signed)
The patient feels that nocturnal desaturations are no longer an issue for him.  The only way to put this to rest is to recheck them on room air, and we'll therefore set up for a followup ONO.  If he continues to have nocturnal desaturations, will set up for a sleep study.

## 2011-08-25 NOTE — Assessment & Plan Note (Signed)
The patient's workup for his dyspnea on exertion has been unrevealing so far.  His chest x-ray is clear, his echo is unimpressive, and his PFTs today are totally normal.  He now feels that he has returned to his usual baseline, and is satisfied with his current exertional tolerance.  It is unclear whether his prior symptoms were all residual from his recent pneumonia.  He feels this is no longer a significant issue for him.  Regarding whether he has asthma or not, it is unclear.  His spirometry in the past was normal, as it is today.  He typically uses symbicort as needed, but did use it approximately 3 days ago.  I will discuss again with the patient discontinuing symbicort to see how he responds.

## 2011-08-25 NOTE — Progress Notes (Signed)
  Subjective:    Patient ID: Donald [name], male    DOB: 1951-01-04, 61 y.o.   MRN: [id number]  HPI The patient comes in today for followup of his nocturnal hypoxemia and dyspnea on exertion.  Since the last visit, the patient feels that his breathing has significantly improved.  He does not have desaturation walking from the waiting room today, and he feels that his breathing is really back to his usual baseline.  He denies any cough, congestion, or mucus.  He has been taking his saturations at 98 on room air, and feels that he is no longer dropping his oxygen level.  But I explained to him that he cannot check this when he is sleeping.  He has had pulmonary function studies that are totally within normal limits, including lung volumes and diffusion capacity.  I have reviewed these with him in detail, and answered all of his questions.   Review of Systems  Constitutional: Negative for fever and unexpected weight change.  HENT: Positive for postnasal drip. Negative for ear pain, nosebleeds, congestion, sore throat, rhinorrhea, sneezing, trouble swallowing, dental problem and sinus pressure.   Eyes: Negative for redness and itching.  Respiratory: Negative for cough, chest tightness, shortness of breath and wheezing.   Cardiovascular: Negative for palpitations and leg swelling.  Gastrointestinal: Negative for nausea and vomiting.  Genitourinary: Negative for dysuria.  Musculoskeletal: Negative for joint swelling.  Skin: Negative for rash.  Neurological: Positive for headaches.  Hematological: Does not bruise/bleed easily.  Psychiatric/Behavioral: Negative for dysphoric mood. The patient is not nervous/anxious.        Objective:   Physical Exam Well-developed male in no acute distress Nose without purulence or discharge noted Chest totally clear Heart exam with regular rate and rhythm Alert and oriented, moves all 4 extremities.       Assessment & Plan:

## 2011-08-25 NOTE — Patient Instructions (Signed)
Will have [employer] recheck your oxygen level overnight off oxygen.  Will call you with results. If you are continuing to drop your level at night, would recommend a sleep study. Will arrange followup depending upon your test results.

## 2011-09-07 ENCOUNTER — Telehealth: Payer: Self-pay | Admitting: Pulmonary Disease

## 2011-09-07 HISTORY — PX: US ECHOCARDIOGRAPHY: HXRAD669

## 2011-09-07 NOTE — Telephone Encounter (Signed)
I spoke with pt and he is requesting his ONO results. KC this is in your VIP folder. Please advise thanks    *Pt aware KC is currently not in the office

## 2011-09-08 NOTE — Telephone Encounter (Signed)
Please let pt know that his oxygen level dropped as low as 74%, and spent more than two hours less than 88% He needs to have a sleep study.  Let me know if he agrees, and I will order.

## 2011-09-08 NOTE — Telephone Encounter (Signed)
Will forward to MR to find these results, thanks

## 2011-09-08 NOTE — Telephone Encounter (Signed)
KC, ono results were in your VIP folder dear. Just closer to the bottom of the file- I have placed them on the top of the stack in your VIP folder :)

## 2011-09-08 NOTE — Telephone Encounter (Signed)
LMOM for pt TCB 

## 2011-09-08 NOTE — Telephone Encounter (Signed)
dont see in my folder

## 2011-09-11 ENCOUNTER — Other Ambulatory Visit: Payer: Self-pay | Admitting: Pulmonary Disease

## 2011-09-11 DIAGNOSIS — G4734 Idiopathic sleep related nonobstructive alveolar hypoventilation: Secondary | ICD-10-CM

## 2011-09-11 NOTE — Telephone Encounter (Signed)
LMOMTCB x 1 for the pt. 

## 2011-09-11 NOTE — Telephone Encounter (Signed)
Pt is aware and agrees to have sleep study.

## 2011-09-14 ENCOUNTER — Encounter: Payer: Self-pay | Admitting: Pulmonary Disease

## 2011-09-25 ENCOUNTER — Other Ambulatory Visit: Payer: Self-pay | Admitting: Cardiovascular Disease

## 2011-09-26 ENCOUNTER — Encounter (HOSPITAL_COMMUNITY): Payer: Self-pay | Admitting: Pharmacy Technician

## 2011-09-29 ENCOUNTER — Encounter (HOSPITAL_COMMUNITY)
Admission: RE | Disposition: A | Discharge status: Home / Self Care | Payer: Self-pay | Source: Ambulatory Visit | Attending: Cardiovascular Disease

## 2011-09-29 ENCOUNTER — Ambulatory Visit (HOSPITAL_COMMUNITY)
Admission: RE | Admit: 2011-09-29 | Discharge: 2011-09-29 | Disposition: A | Discharge status: Home / Self Care | Payer: PRIVATE HEALTH INSURANCE | Source: Ambulatory Visit | Attending: Cardiovascular Disease | Admitting: Cardiovascular Disease

## 2011-09-29 DIAGNOSIS — N059 Unspecified nephritic syndrome with unspecified morphologic changes: Secondary | ICD-10-CM | POA: Insufficient documentation

## 2011-09-29 DIAGNOSIS — I10 Essential (primary) hypertension: Secondary | ICD-10-CM | POA: Insufficient documentation

## 2011-09-29 DIAGNOSIS — E785 Hyperlipidemia, unspecified: Secondary | ICD-10-CM | POA: Insufficient documentation

## 2011-09-29 DIAGNOSIS — I428 Other cardiomyopathies: Secondary | ICD-10-CM | POA: Insufficient documentation

## 2011-09-29 DIAGNOSIS — R9439 Abnormal result of other cardiovascular function study: Secondary | ICD-10-CM | POA: Insufficient documentation

## 2011-09-29 DIAGNOSIS — E119 Type 2 diabetes mellitus without complications: Secondary | ICD-10-CM | POA: Insufficient documentation

## 2011-09-29 HISTORY — PX: CARDIAC CATHETERIZATION: SHX172

## 2011-09-29 HISTORY — PX: LEFT HEART CATHETERIZATION WITH CORONARY ANGIOGRAM: SHX5451

## 2011-09-29 LAB — GLUCOSE, CAPILLARY: Glucose-Capillary: 149 mg/dL — ABNORMAL HIGH (ref 70–99)

## 2011-09-29 SURGERY — LEFT HEART CATHETERIZATION WITH CORONARY ANGIOGRAM
Anesthesia: LOCAL

## 2011-09-29 MED ORDER — SODIUM CHLORIDE 0.9 % IJ SOLN: 3.0000 mL | INTRAMUSCULAR | Status: DC | PRN

## 2011-09-29 MED ORDER — HEPARIN (PORCINE) IN NACL 2-0.9 UNIT/ML-% IJ SOLN
INTRAMUSCULAR | Status: AC
  Filled 2011-09-29: qty 2000

## 2011-09-29 MED ORDER — LIDOCAINE HCL (PF) 1 % IJ SOLN
INTRAMUSCULAR | Status: AC
  Filled 2011-09-29: qty 30

## 2011-09-29 MED ORDER — ASPIRIN 81 MG PO CHEW
324.0000 mg | CHEWABLE_TABLET | Freq: Once | ORAL | Status: AC
  Administered 2011-09-29: 324 mg via ORAL

## 2011-09-29 MED ORDER — ACETAMINOPHEN 325 MG PO TABS: 650.0000 mg | ORAL_TABLET | ORAL | Status: DC | PRN

## 2011-09-29 MED ORDER — OXYCODONE-ACETAMINOPHEN 5-325 MG PO TABS: 1.0000 | ORAL_TABLET | ORAL | Status: DC | PRN

## 2011-09-29 MED ORDER — ONDANSETRON HCL 4 MG/2ML IJ SOLN: 4.0000 mg | Freq: Four times a day (QID) | INTRAMUSCULAR | Status: DC | PRN

## 2011-09-29 MED ORDER — MIDAZOLAM HCL 2 MG/2ML IJ SOLN
INTRAMUSCULAR | Status: AC
  Filled 2011-09-29: qty 2

## 2011-09-29 MED ORDER — SODIUM CHLORIDE 0.9 % IV SOLN: 1.0000 mL/kg/h | INTRAVENOUS | Status: DC

## 2011-09-29 MED ORDER — SODIUM CHLORIDE 0.9 % IV SOLN
INTRAVENOUS | Status: DC
  Administered 2011-09-29: 1000 mL via INTRAVENOUS

## 2011-09-29 MED ORDER — NITROGLYCERIN 0.2 MG/ML ON CALL CATH LAB
INTRAVENOUS | Status: AC
  Filled 2011-09-29: qty 1

## 2011-09-29 MED ORDER — ASPIRIN 81 MG PO CHEW
CHEWABLE_TABLET | ORAL | Status: AC
  Administered 2011-09-29: 324 mg via ORAL
  Filled 2011-09-29: qty 4

## 2011-09-29 MED ORDER — FENTANYL CITRATE 0.05 MG/ML IJ SOLN
INTRAMUSCULAR | Status: AC
  Filled 2011-09-29: qty 2

## 2011-09-29 NOTE — CV Procedure (Signed)
CARDIAC CATHETERIZATION REPORT   Procedures performed:  1. Left heart catheterization  2. Selective coronary angiography  3. Left ventriculography   Reason for procedure:  Abnormal nuclear stress test/stress echo Cardiomyopathy  Procedure performed by: Thurmon Fair, MD, [facility]  Complications: none   Estimated blood loss: less than 5 mL   History:  61 yo man with diabetes, HTN, HLP and depressed left ventricular systolic function by echo (EF 16%).   Consent: The risks, benefits, and details of the procedure were explained to the patient. Risks including death, MI, stroke, bleeding, limb ischemia, renal failure and allergy were described and accepted by the patient. Informed written consent was obtained prior to proceeding.  Technique: The patient was brought to the cardiac catheterization laboratory in the fasting state. He was prepped and draped in the usual sterile fashion. Local anesthesia with 1% lidocaine was administered to the right wrist area. Using the modified Seldinger technique a 5 French right radial sheath was introduced without difficulty. Under fluoroscopic guidance, using 5 [place] JL4, AR1 (unsuccessful cannulation of RCA with JR and WR catheters) and angled pigtail catheters, selective cannulation of the left coronary artery, right coronary artery and left ventricle were respectively performed. Several coronary angiograms in a variety of projections were recorded, as well as a left ventriculogram in the RAO projection. Left ventricular pressure and a pull back to the aorta were recorded. No immediate complications occurred. At the end of the procedure, all catheters were removed. After the procedure, hemostasis will be achieved with manual pressure.  Contrast used: 90 mL Omnipaque  Angiographic Findings:  1. The left main coronary artery is a very large, caliber and very short codominant vessel, free of significant atherosclerosis and branches in the usual fashion into the  left anterior descending artery and left circumflex coronary artery, but also generates a very large ramus intermedius artery.  2. The left anterior descending artery is a large vessel that reaches the apex and generates multiple relatively small diagonal branches. There is evidence of minimal luminal irregularities and no calcification. No hemodynamically meaningful stenoses are seen. 3. The ramus intermedius artery provides blood supply to the majority of the lateral wall and is free of visible disease. 3. The left circumflex coronary artery is a very large-size vessel codominant vessel that generates one major oblique marginal artery, two medium size posterolateral arteries and a small codominant posterior descending artery. There is evidence of minimal luminal irregularities and no calcification. No hemodynamically meaningful stenoses are seen. 4. The right coronary artery is a medium-size codominant vessel that generates a short posterior descending artery. There is evidence of mild spasm at the catheter tip, but otherwise no luminal irregularities and no calcification. No hemodynamically meaningful stenoses are seen.  5. The left ventricle is normal in size. The left ventricle systolic function is normal with an estimated ejection fraction of 55%. Regional wall motion abnormalities are not seen. No left ventricular thrombus is seen. There is no insufficiency. The ascending aorta appears normal. There is no aortic valve stenosis by pullback. The left ventricular end-diastolic pressure is 13 mm Hg.    IMPRESSIONS:  Angiographically normal coronary arteries. Normal left ventricular systolic function RECOMMENDATION:  Continue risk factor modification.    Thurmon Fair, MD, [facility] [phone] office [phone] pager 09/29/2011 2:37 PM

## 2011-09-29 NOTE — Discharge Instructions (Signed)
Radial Site Care Refer to this sheet in the next few weeks. These instructions provide you with information on caring for yourself after your procedure. Your caregiver may also give you more specific instructions. Your treatment has been planned according to current medical practices, but problems sometimes occur. Call your caregiver if you have any problems or questions after your procedure. HOME CARE INSTRUCTIONS  You may shower the day after the procedure.Remove the bandage (dressing) and gently wash the site with plain soap and water.Gently pat the site dry.   Do not apply powder or lotion to the site.   Do not submerge the affected site in water for 3 to 5 days.   Inspect the site at least twice daily.   Do not flex or bend the affected arm for 24 hours.   No lifting over 5 pounds (2.3 kg) for 5 days after your procedure.   Do not drive home if you are discharged the same day of the procedure. Have someone else drive you.   You may drive 24 hours after the procedure unless otherwise instructed by your caregiver.   Do not operate machinery or power tools for 24 hours.   A responsible adult should be with you for the first 24 hours after you arrive home.  What to expect:  Any bruising will usually fade within 1 to 2 weeks.   Blood that collects in the tissue (hematoma) may be painful to the touch. It should usually decrease in size and tenderness within 1 to 2 weeks.  SEEK IMMEDIATE MEDICAL CARE IF:  You have unusual pain at the radial site.   You have redness, warmth, swelling, or pain at the radial site.   You have drainage (other than a small amount of blood on the dressing).   You have chills.   You have a fever or persistent symptoms for more than 72 hours.   You have a fever and your symptoms suddenly get worse.   Your arm becomes pale, cool, tingly, or numb.   You have heavy bleeding from the site. Hold pressure on the site.  Document Released: 05/27/2010  Document Revised: 04/13/2011 Document Reviewed: 05/27/2010 [facility] Patient Information 2012 [place], [place].Radial Site Care Refer to this sheet in the next few weeks. These instructions provide you with information on caring for yourself after your procedure. Your caregiver may also give you more specific instructions. Your treatment has been planned according to current medical practices, but problems sometimes occur. Call your caregiver if you have any problems or questions after your procedure. HOME CARE INSTRUCTIONS  You may shower the day after the procedure.Remove the bandage (dressing) and gently wash the site with plain soap and water.Gently pat the site dry.   Do not apply powder or lotion to the site.   Do not submerge the affected site in water for 3 to 5 days.   Inspect the site at least twice daily.   Do not flex or bend the affected arm for 24 hours.   No lifting over 5 pounds (2.3 kg) for 5 days after your procedure.   Do not drive home if you are discharged the same day of the procedure. Have someone else drive you.   You may drive 24 hours after the procedure unless otherwise instructed by your caregiver.   Do not operate machinery or power tools for 24 hours.   A responsible adult should be with you for the first 24 hours after you arrive home.  What to  expect:  Any bruising will usually fade within 1 to 2 weeks.   Blood that collects in the tissue (hematoma) may be painful to the touch. It should usually decrease in size and tenderness within 1 to 2 weeks.  SEEK IMMEDIATE MEDICAL CARE IF:  You have unusual pain at the radial site.   You have redness, warmth, swelling, or pain at the radial site.   You have drainage (other than a small amount of blood on the dressing).   You have chills.   You have a fever or persistent symptoms for more than 72 hours.   You have a fever and your symptoms suddenly get worse.   Your arm becomes pale, cool, tingly, or  numb.   You have heavy bleeding from the site. Hold pressure on the site.  Document Released: 05/27/2010 Document Revised: 04/13/2011 Document Reviewed: 05/27/2010 [facility] Patient Information 2012 [place], [place].

## 2011-09-29 NOTE — H&P (Signed)
Date of Initial H&P: 09/22/2011  History reviewed, patient examined, no change in status, stable for surgery. Coronary angiography for unexplained cardiomyopathy with regional wall motion abnormalities in a patient with numerous coronary risk factors. Thurmon Fair, MD, [facility] [phone] office [phone] pager 09/29/2011 12:22 PM

## 2011-10-02 ENCOUNTER — Encounter (HOSPITAL_BASED_OUTPATIENT_CLINIC_OR_DEPARTMENT_OTHER): Payer: PRIVATE HEALTH INSURANCE

## 2011-10-02 MED FILL — Nicardipine HCl IV Soln 2.5 MG/ML: INTRAVENOUS | Qty: 1 | Status: AC

## 2011-10-27 ENCOUNTER — Ambulatory Visit (HOSPITAL_BASED_OUTPATIENT_CLINIC_OR_DEPARTMENT_OTHER): Payer: PRIVATE HEALTH INSURANCE | Attending: Pulmonary Disease

## 2011-10-27 VITALS — Ht 68.0 in | Wt 175.0 lb

## 2011-10-27 DIAGNOSIS — G4734 Idiopathic sleep related nonobstructive alveolar hypoventilation: Secondary | ICD-10-CM

## 2011-10-27 DIAGNOSIS — G4733 Obstructive sleep apnea (adult) (pediatric): Secondary | ICD-10-CM

## 2011-11-11 DIAGNOSIS — G4733 Obstructive sleep apnea (adult) (pediatric): Secondary | ICD-10-CM

## 2011-11-11 NOTE — Procedures (Signed)
NAMEHAYWOOD, Donald [name] NO.:  [id number]  MEDICAL RECORD NO.:  [id number]          PATIENT TYPE:  OUT  LOCATION:  SLEEP CENTER                 FACILITY:  [facility]  PHYSICIAN:  Barbaraann Share, MD,FCCPDATE OF BIRTH:  09/06[birth year]1951  DATE OF STUDY:  10/27/2011                           NOCTURNAL POLYSOMNOGRAM  REFERRING PHYSICIAN:  Barbaraann Share, MD,FCCP  REFERRING PHYSICIAN:  Barbaraann Share, MD,FCCP.  INDICATIONS FOR STUDY:  Hypersomnia with sleep apnea.  EPWORTH SLEEPINESS SCORE:  7.  SLEEP ARCHITECTURE:  The patient had total sleep time of 280 minutes with no slow-wave sleep and only 33 minutes of REM.  Sleep onset latency was normal at 16 minutes, and REM onset was normal at 113 minutes. Sleep efficiency was moderately decreased at 74%.  RESPIRATORY DATA:  The patient was found to have one central apnea and 69 obstructive hypopneas, giving him an apnea-hypopnea index of 15 events per hour.  The events occurred primarily in the supine position, and there was very loud snoring noted throughout.  OXYGEN DATA:  There was O2 desaturation as low as 80% with his obstructive events.  In fact, the patient had 194 minutes less than 88% during the night.  CARDIAC DATA:  No clinically significant arrhythmias were noted.  MOVEMENTS/PARASOMNIA:  The patient had no significant leg jerks or other abnormal behavior seen.  IMPRESSION/RECOMMENDATION: 1. Mild-to-moderate obstructive sleep apnea/hypopnea syndrome with an     AHI of 15 events per hour and oxygen desaturation as low as 80%.     Treatment for this degree of sleep apnea should focus primarily on     modest weight loss, upper airway surgery, dental appliance, and     also CPAP.  Clinical correlation is suggested. 2. The patient had oxygen desaturation as low as 80%, but spent 194     minutes during the night, less than 88% in total.     Barbaraann Share, MD,FCCP Diplomate, American Board of  Sleep Medicine    [facility]/MEDQ  D:  11/11/2011 15:50:36  T:  11/11/2011 22:03:01  Job:  [id number]

## 2011-11-13 ENCOUNTER — Telehealth: Payer: Self-pay | Admitting: Pulmonary Disease

## 2011-11-13 NOTE — Telephone Encounter (Signed)
Pt is requesting his sleep study results from 10/27/11. Please advise KC thanks

## 2011-11-13 NOTE — Telephone Encounter (Signed)
Donald Fiscal, do you have the results. Please advise thanks

## 2011-11-13 NOTE — Telephone Encounter (Signed)
I suspect Lawson Fiscal has this and is to arrange ov to discuss.  Ask her about this.

## 2011-11-13 NOTE — Telephone Encounter (Signed)
I have the results. LMOMTCB x 1 so we can schedule the pt for an OV to discuss.

## 2011-11-13 NOTE — Telephone Encounter (Signed)
See last note in string

## 2011-11-14 ENCOUNTER — Encounter: Payer: Self-pay | Admitting: Pulmonary Disease

## 2011-11-14 ENCOUNTER — Ambulatory Visit (INDEPENDENT_AMBULATORY_CARE_PROVIDER_SITE_OTHER): Payer: PRIVATE HEALTH INSURANCE | Admitting: Pulmonary Disease

## 2011-11-14 VITALS — BP 132/90 | HR 95 | Temp 98.6°F | Ht 68.0 in | Wt 188.2 lb

## 2011-11-14 DIAGNOSIS — G4733 Obstructive sleep apnea (adult) (pediatric): Secondary | ICD-10-CM

## 2011-11-14 NOTE — Assessment & Plan Note (Signed)
The patient has been found to have mild obstructive sleep apnea by his AHI, but has a prolonged oxygen desaturation related to his obstructive events during this study.  I have had a long discussion with him about the treatment of mild to moderate sleep apnea, including a trial of weight loss alone, upper airway surgery, dental appliance, and also CPAP.  Because of his desaturation issue, I have recommended a more aggressive treatment while he is trying to lose weight.  I do not think surgery is a reasonable choice for him, and therefore I have recommended either a dental appliance or CPAP.  The patient would like to take some time to think about this, and I would be happy to discuss with him further.  He will let me know his decision.

## 2011-11-14 NOTE — Progress Notes (Signed)
  Subjective:    Patient ID: Donald [name], male    DOB: 1950/10/19, 61 y.o.   MRN: [id number]  HPI The patient comes in today for followup of his recent sleep study, done as part of a workup for nocturnal hypoxemia.  He has been found to have normal pulmonary function studies, but did have significant desaturation on his overnight oximetry.  He then underwent a sleep study where he was found to have mild obstructive sleep apnea, with an AHI of 15 events per hour.  He did have O2 desaturation as low as 80%, but surprisingly spent 194 minutes less than 88%.  He is currently on nocturnal oxygen.  I have reviewed the study with him in detail, and answered all of his questions.   Review of Systems  Constitutional: Negative for fever and unexpected weight change.  HENT: Negative for ear pain, nosebleeds, congestion, sore throat, rhinorrhea, sneezing, trouble swallowing, dental problem, postnasal drip and sinus pressure.   Eyes: Negative for redness and itching.  Respiratory: Negative for cough, chest tightness, shortness of breath and wheezing.   Cardiovascular: Negative for palpitations and leg swelling.  Gastrointestinal: Negative for nausea and vomiting.  Genitourinary: Negative for dysuria.  Musculoskeletal: Negative for joint swelling.  Skin: Negative for rash.  Neurological: Negative for headaches.  Hematological: Does not bruise/bleed easily.  Psychiatric/Behavioral: Negative for dysphoric mood. The patient is not nervous/anxious.   All other systems reviewed and are negative.       Objective:   Physical Exam Overweight male in no acute distress Nose without purulence or discharge noted Lower extremities without significant hematoma no cyanosis Alert and oriented, moves all 4 extremities.       Assessment & Plan:

## 2011-11-14 NOTE — Patient Instructions (Addendum)
Think about the various treatments we discussed for your mild sleep apnea:  Trial of weight loss alone (conservative), and also more aggressive such as surgery, dental appliance, and cpap.  Let me know how you would like to proceed.

## 2011-11-14 NOTE — Telephone Encounter (Signed)
Made appointment today @ 1:45 pm w/ KC.  Pt stated nothing further needed at this time.   Antionette Fairy

## 2012-09-02 ENCOUNTER — Encounter: Payer: Self-pay | Admitting: *Deleted

## 2012-09-05 ENCOUNTER — Encounter: Payer: Self-pay | Admitting: Cardiovascular Disease

## 2013-03-18 ENCOUNTER — Encounter: Payer: Self-pay | Admitting: Neurology

## 2013-03-18 ENCOUNTER — Ambulatory Visit (INDEPENDENT_AMBULATORY_CARE_PROVIDER_SITE_OTHER): Payer: PRIVATE HEALTH INSURANCE | Admitting: Neurology

## 2013-03-18 ENCOUNTER — Encounter (INDEPENDENT_AMBULATORY_CARE_PROVIDER_SITE_OTHER): Payer: Self-pay

## 2013-03-18 VITALS — BP 115/70 | HR 77 | Resp 16 | Ht 68.0 in | Wt 188.0 lb

## 2013-03-18 DIAGNOSIS — G4710 Hypersomnia, unspecified: Secondary | ICD-10-CM

## 2013-03-18 DIAGNOSIS — R51 Headache: Secondary | ICD-10-CM

## 2013-03-18 DIAGNOSIS — R0902 Hypoxemia: Secondary | ICD-10-CM

## 2013-03-18 NOTE — Progress Notes (Signed)
[facility]  Provider:  Melvyn Novas, M D  Referring Provider: Hoyle Sauer, MD Primary Care Physician:  Hoyle Sauer, MD  Chief Complaint  Patient presents with  . sleep consult    HPI:  Donald [name] is a 62 y.o. male  Is seen here as a referral/ revisit  from Dr. Felipa Eth for OSA re -evaluation.   [name] reports that he was evaluated in April 2013 by  Polysomnography, about 2 a month after he had a bout of pneumonia. His cardiologist Dr. Denyse Amass to referred him for the sleep study, which was performed at the heart and sleep Center on [facility]. The patient followed up with Dr. Mellody Dance clowns of the lower pulmonology group. The sleep study was only mildly positive for Dr. sleep apnea an oral appliance was discussed bursal CPAP he was. An overnight pulse oximetry revealed that the patient also had nocturnal hypoxia and this in return that to Dr. Madaline Brilliant prescribing oxygen supplement at night for the patient's use. I would also like to at that the patient is a diagnosis of asthma. Otherwise he was repaired in his early 30s had cervical spine fusion by Dr. Yetta Barre, and abdominal hernia that was surgically treated ,  left  and right eye cataract., and has had a deviated septum repair.   The patient reports that he became more concerned after he had entertained to discontinue the use of oxygen pounds use the pulse oximetry on the fingers of his left hand while watching TV and pills in, balding he has pulse oximetry decreased to the mid 70s. The patient reports that his blood pressure between left and right arm however does not very much and that he not a doesn't get cold or bluish hands or fingers. The patient also reports that he is able to hold his breath when swimming and diving for prolonged periods of time, he seems not to respond versus a Burch to take the next breath. His wife has also noticed that he seems to breathe extremely shallow when he rests. He sleeps in his  recliner however her not in the bad and this may be due to discomfort when reclining completely.  The patient was to bed between 10 and 11 PM, he arises around 7 AM, he estimates is or old nocturnal sleep time on average at 8 hours.  She has occasional nocturnal arousals sometimes she will bleed for a while until he falls back asleep. He will have one rarely to bathroom breaks at night.  He endorses fatigue severity score today at 55 points and his were sleepiness score at 13 points, the EGD as depression score was endorsed at 12 , and  is elevated at 12 points.  He has also noticed that he gets morning headaches or nocturnal headaches when sleeping in bed on either side. He feels that his sinuses stop up his neck nasal breathing and that this causes a great deal of pressure.  I was able to review his overnight pulse oximetry year which was dated 4-to-13 and was performed on room air.  The patient's h rule still the highest at 101 bpm  , the lowest was 62 beats per minute, his oxygen levels stayed under 88% for 7 hours 57 minutes 20 seconds at night-  with a total recording time of 8.5 hours.   The patient also has a history of PVCs and  is well aware of it,  which may explain his palpitations , his heart rate  The patient has a history of elevated liver enzymes, hyperlipidemia, hypertension, IgA nephropathy, diabetes with renal manifestation 2 elevated prostate-specific antigen he also reports urge, palpitations fatigue and some degenerative bone disease joint disease.    Review of Systems: Out of a complete 14 system review, the patient complains of only the following symptoms, and all other reviewed systems are negative. Fatigue , depression,    History   Social History  . Marital Status: Married    Spouse Name: Donald [name]    Number of Children: N/A  . Years of Education: N/A   Occupational History  . Emergency planning/management officer    Social History Main Topics  . Smoking status: Never Smoker   .  Smokeless tobacco: Never Used  . Alcohol Use: No  . Drug Use: Not on file  . Sexual Activity: Not on file   Other Topics Concern  . Not on file   Social History Narrative  . No narrative on file    Family History  Problem Relation Age of Onset  . Coronary artery disease Father   . Diabetes type II Father   . Heart attack Father     Past Medical History  Diagnosis Date  . Diabetes mellitus   . Hypertension   . Hyperlipidemia   . Pneumonia   . Sinusitis   . Bronchitis   . Asthma   . Allergic rhinitis, seasonal   . Chronic kidney disease, stage I   . IgA nephropathy   . Degenerative joint disease of knee     bilateral  . GERD (gastroesophageal reflux disease)   . Heart murmur     history of  . History of hematuria   . History of acute prostatitis   . OSA on CPAP     Past Surgical History  Procedure Laterality Date  . Cervical spine surgery    . Hernia repair    . Deviated septum  1930s  . Tonsillectomy    . Cardiac catheterization  09/29/2011    normal  . Nm myoview ltd  07/08/2011    mild LV dilatation,mild septal hypokinesia  . US echocardiography  09/07/2011    EF 35-45%,mod.ant hypokinesis,boderline LA & RA enlargement,trace MR,TR & PI    Current Outpatient Prescriptions  Medication Sig Dispense Refill  . aspirin EC 81 MG tablet Take 81 mg by mouth daily.      . budesonide-formoterol (SYMBICORT) 160-4.5 MCG/ACT inhaler Inhale 2 puffs into the lungs 2 (two) times daily as needed. For wheezing      . buPROPion (WELLBUTRIN XL) 150 MG 24 hr tablet Take 150 mg by mouth daily.      Marland Kitchen diltiazem (CARDIZEM CD) 240 MG 24 hr capsule Take 240 mg by mouth daily.      . fexofenadine (ALLEGRA) 180 MG tablet Take 180 mg by mouth daily.      . fluticasone (FLONASE) 50 MCG/ACT nasal spray Place 2 sprays into the nose daily as needed. Allergies      . glimepiride (AMARYL) 1 MG tablet Take 1 mg by mouth daily before breakfast.      . metFORMIN (GLUCOPHAGE) 1000 MG tablet Take  1,000 mg by mouth 2 (two) times daily with a meal.      . nebivolol (BYSTOLIC) 10 MG tablet Take 10 mg by mouth every evening.      . olmesartan-hydrochlorothiazide (BENICAR HCT) 40-12.5 MG per tablet Take 1 tablet by mouth every evening.      Marland Kitchen omega-3 acid ethyl esters (LOVAZA)  1 G capsule Take 1 g by mouth daily.      Marland Kitchen omeprazole (PRILOSEC) 20 MG capsule Take 20 mg by mouth daily.      Marland Kitchen acetaminophen (TYLENOL) 500 MG tablet Take 500 mg by mouth every 6 (six) hours as needed for pain.      Marland Kitchen albuterol (PROVENTIL HFA;VENTOLIN HFA) 108 (90 BASE) MCG/ACT inhaler Inhale 2 puffs into the lungs every 6 (six) hours as needed. For shortness of breath      . fenofibrate 160 MG tablet Take 160 mg by mouth daily.       No current facility-administered medications for this visit.    Allergies as of 03/18/2013 - Review Complete 03/18/2013  Allergen Reaction Noted  . Ciprofloxacin  07/06/2011  . Levofloxacin  07/06/2011    Vitals: BP 115/70  Pulse 77  Resp 16  Ht 5\' 8"  (1.727 m)  Wt 188 lb (85.276 kg)  BMI 28.59 kg/m2 Last Weight:  Wt Readings from Last 1 Encounters:  03/18/13 188 lb (85.276 kg)   Last Height:   Ht Readings from Last 1 Encounters:  03/18/13 5\' 8"  (1.727 m)    Physical exam:  General: The patient is awake, alert and appears not in acute distress. The patient is well groomed. Head: Normocephalic, atraumatic. Neck is supple. Mallampati 1 , neck circumference:14  Cardiovascular:  Regular rate and rhythm without  murmurs or carotid bruit, and with rare extra beats -without distended neck veins. Respiratory: Lungs are clear to auscultation. Skin:  Without evidence of edema, has  facial redness. Trunk: BMI is  elevated and patient has normal posture.  Neurologic exam : The patient is awake and alert, oriented to place and time.  Memory subjective   described as intact. There is a normal attention span & concentration ability.  Speech is fluent without  dysarthria,  dysphonia or aphasia. Mood and affect are appropriate.  Cranial nerves: Pupils are equal and briskly reactive to light. Funduscopic exam without   evidence of pallor or edema. Extraocular movements  in vertical and horizontal planes intact and without nystagmus. Visual fields by finger perimetry are limited , there is significant loss of visual acuity. . Left eye ptosis. Macular degeneration , central blind spot.  Hearing to finger rub intact.  Facial sensation intact to fine touch.  Facial motor strength is not symmetric, and right face is droopy-  and tongue and uvula move midline.mallompatti is grade 1.   Motor exam:   Normal tone and normal muscle bulk and symmetric normal strength in all extremities.  Sensory:  Fine touch, pinprick and vibration were tested in all extremities. Proprioception is normal.  Coordination: Rapid alternating movements in the fingers/hands is tested and normal. Finger-to-nose maneuver tested and normal without evidence of ataxia, dysmetria or tremor.  Gait and station: Patient walks without assistive device and is able and assisted stool climb up to the exam table. Strength within normal limits. Stance is stable and normal. Tandem gait is  unfragmented. Romberg testing is normal.  Deep tendon reflexes: in the  upper and lower extremities are symmetric and intact. Babinski  downgoing.   Assessment:  After physical and neurologic examination, review of laboratory studies, imaging,  neurophysiology testing and pre-existing records, assessment is that of a patient with rather central apnea or CO2 retention, I suspect possible increased intracranial  pressure,  Hypnic headaches. Which in return can cause fatigue,   Plan:  Treatment plan and additional workup : MRI brain to rule out strokes-  see facial droop  AND ptosis. Cognitive changes.  Depression is present.  GDS, needs treatment.   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  Limited medication tolerance.  Severe limited vision.  SPLIT study with [facility] - criteria , CO2 measures and oxygen to be given if possible to check on hypoxemia. He has co-morbidities, lung , kidney , memory ... SPLIT at lowest allowed and score at 3%.

## 2013-04-23 ENCOUNTER — Ambulatory Visit (INDEPENDENT_AMBULATORY_CARE_PROVIDER_SITE_OTHER): Payer: PRIVATE HEALTH INSURANCE

## 2013-04-23 DIAGNOSIS — G4710 Hypersomnia, unspecified: Secondary | ICD-10-CM

## 2013-04-23 DIAGNOSIS — R0902 Hypoxemia: Secondary | ICD-10-CM

## 2013-04-23 DIAGNOSIS — R51 Headache: Secondary | ICD-10-CM

## 2013-04-25 ENCOUNTER — Ambulatory Visit (INDEPENDENT_AMBULATORY_CARE_PROVIDER_SITE_OTHER): Payer: PRIVATE HEALTH INSURANCE

## 2013-04-25 DIAGNOSIS — G4710 Hypersomnia, unspecified: Secondary | ICD-10-CM

## 2013-04-25 DIAGNOSIS — R51 Headache: Secondary | ICD-10-CM

## 2013-04-25 DIAGNOSIS — R0902 Hypoxemia: Secondary | ICD-10-CM

## 2013-04-29 NOTE — Progress Notes (Signed)
Quick Note:  I called and reviewed findings of MRI. Patient is asking if the white matter lesions cause his frequent migraines. I let him know that is usually more a result of migraines rather than the cause of them. He can discuss further with Dr. Vickey Huger at next appointment. Patient would like to schedule F/U appointment to Sleep Study with Dr. Vickey Huger. I do not see anything available before April 2014. I will forward on to Dr. Oliva Bustard medical assistant to follow up and schedule.  Patient thanked me. ______

## 2013-05-15 ENCOUNTER — Telehealth: Payer: Self-pay | Admitting: Neurology

## 2013-05-15 NOTE — Telephone Encounter (Signed)
I called and spoke with the patient about his sleep study result.  I informed the patient that the study reveal no evidence of significant central or obstructive sleep apnea, but it did reveal sleep rated hypoxemia.  I informed the patient that Dr. Brett Fairy recommends he continue O2 during sleep. Patient wants to discuss sleep study results with Dr. Brett Fairy, so he is scheduled for June 13, 2013 at 2:00

## 2013-05-22 ENCOUNTER — Encounter: Payer: Self-pay | Admitting: *Deleted

## 2013-06-13 ENCOUNTER — Ambulatory Visit (INDEPENDENT_AMBULATORY_CARE_PROVIDER_SITE_OTHER): Payer: PRIVATE HEALTH INSURANCE | Admitting: Neurology

## 2013-06-13 ENCOUNTER — Encounter (INDEPENDENT_AMBULATORY_CARE_PROVIDER_SITE_OTHER): Payer: Self-pay

## 2013-06-13 ENCOUNTER — Encounter: Payer: Self-pay | Admitting: Neurology

## 2013-06-13 VITALS — BP 120/77 | HR 76 | Resp 17 | Ht 68.75 in | Wt 181.0 lb

## 2013-06-13 DIAGNOSIS — G629 Polyneuropathy, unspecified: Secondary | ICD-10-CM

## 2013-06-13 DIAGNOSIS — J849 Interstitial pulmonary disease, unspecified: Secondary | ICD-10-CM | POA: Insufficient documentation

## 2013-06-13 DIAGNOSIS — N049 Nephrotic syndrome with unspecified morphologic changes: Secondary | ICD-10-CM

## 2013-06-13 DIAGNOSIS — R0902 Hypoxemia: Secondary | ICD-10-CM | POA: Insufficient documentation

## 2013-06-13 DIAGNOSIS — N048 Nephrotic syndrome with other morphologic changes: Secondary | ICD-10-CM

## 2013-06-13 DIAGNOSIS — G589 Mononeuropathy, unspecified: Secondary | ICD-10-CM

## 2013-06-13 DIAGNOSIS — J8409 Other alveolar and parieto-alveolar conditions: Secondary | ICD-10-CM

## 2013-06-13 NOTE — Patient Instructions (Addendum)
Hypoxemia Hypoxemia occurs when your blood does not contain enough oxygen. The body cannot work well when it does not have enough oxygen, because every part of your body needs oxygen. Oxygen travels to all parts of the body through your blood. Hypoxemia can develop suddenly or can come on slowly. CAUSES Some common causes of hypoxemia include:  Long-term (chronic) lung diseases, such as chronic obstructive pulmonary disease (COPD) or interstitial lung disease.  Disorders that affect breathing at night, such as sleep apnea.  Fluid buildup in your lungs (pulmonary edema).  Lung infection (pneumonia).  Lung or throat cancer.  Abnormal blood flow that bypasses the lungs (shunt).  Certain diseasesthat affect nerves or muscles.  A collapsed lung (pneumothorax).  A blood clot in the lungs (pulmonary embolus).  Certain types of heart disease.  Slow or shallow breathing (hypoventilation).  Certain medicines.  High altitudes.  Toxic chemicals and gases. SIGNS AND SYMPTOMS Not everyone who has hypoxemia will develop symptoms. If the hypoxemia developed quickly, you will likely have symptoms such as shortness of breath. If the hypoxemia came on slowly over months or years, you may not notice any symptoms. Symptoms can include:  Shortness of breath (dyspnea).  Bluish color of the skin, lips, or nail beds.  Breathing that is fast, noisy, or shallow.  A fast heartbeat.  Feeling tired or sleepy.  Being confused or feeling anxious. DIAGNOSIS To determine if you have hypoxemia, your health care provider may perform:  A physical exam.  Blood tests.  A pulse oximetry. A sensor will be put on your finger, toe, or earlobe to measure the percent of oxygen in your blood. TREATMENT You will likely be treated with oxygen therapy. Depending on the cause of your hypoxemia, you may need oxygen for a short time (weeks or months), or you may need it indefinitely. Your health care provider  may also recommend other therapies to treat the underlying cause of your hypoxemia. HOME CARE INSTRUCTIONS  Only take over-the-counter or prescription medicines as directed by your health care provider.  Follow oxygen safety measures if you are on oxygen therapy. These may include:  Always having a backup supply of oxygen.  Not allowing anyone to smoke around oxygen.  Handling the oxygen tanks carefully and as instructed.  If you smoke, quit. Stay away from people who smoke.  Follow up with your health care provider as directed. SEEK MEDICAL CARE IF:  You have any concerns about your oxygen therapy.  You still have trouble breathing.  You become short of breath when you exercise.  You are tired when you wake up.  You have a headache when you wake up. SEEK IMMEDIATE MEDICAL CARE IF:   Your breathing gets worse.  You have new shortness of breath with normal activity.  You have a bluish color of the skin, lips, or nail beds.  You have confusion or cloudy thinking.  You cough up dark mucus.  You have chest pain.  You have a fever. MAKE SURE YOU:  Understand these instructions.  Will watch your condition.  Will get help right away if you are not doing well or get worse. Document Released: 11/07/2010 Document Revised: 12/25/2012 Document Reviewed: 11/21/2012 [facility] Patient Information 2014 [place], [place]. Charcot-Marie-Tooth Disorder Charcot-Marie-Tooth (CMT) Disorder is a group of inherited diseases which affect the nerves to the arms and legs. The problems can range from very mild to severe weakness. The feet and legs tend to be affected first. High foot arches and curled toes are  often the first signs of this disorder. Because the muscles are not getting the right signals from the brain, walking may become difficult. There may be numbness as well. Fingers and hands may also be involved. Over time, the feet and hands may be deformed.  TREATMENT  There is no cure  or specific treatment for CMT. Care may include custom-made shoes and leg braces to reduce discomfort and increase function. Physical therapy and moderate activity are often used to maintain muscle strength. For some patients, surgery may help correct deformities. Pain medicines may be needed. PROGNOSIS CMT is not a fatal disease and most forms of the disorder do not affect normal life expectancy. Most individuals with CMT are able to work. Wheelchair confinement is rare. Document Released: 04/14/2002 Document Revised: 07/17/2011 Document Reviewed: 04/21/2008 [facility] Patient Information 2014 [place].

## 2013-06-13 NOTE — Progress Notes (Signed)
[facility]  Provider:  Larey Seat, M D  Referring Provider: [name], MD Primary Care Physician:  [name], MD  Chief Complaint  Patient presents with  . Follow-up    Room 10  . discuss sleep study results    HPI:  Donald [name] is a 63 y.o. male  Is seen here as a referral/ revisit  from [name] after  PSG for presumed  OSA re -evaluation.   Donald [name] underwent a regular polysomnography on 04-25-13. He reached an apnea hypotony index of only 5.7 and in our GI of 7.5, during dream sleep the REM AHI was 20.3 and in supine sleep the AHI was only 4.2. This seemed to be nausea planes primary complement to his sleep disorder the most concerning finding was a little oxygen saturation is in needier at 77% and was a total time in desaturation off to one at 89 minutes oxygen was supplemented during this polysomnographic study in increased to 2 L per minute from initially 1 L. I discussed today with the patient the results that would not necessarily feel that he needs to be on CPAP but I strongly urged him to use oxygen at night.   However some patients have reported improvement in shortness of breath if CPAP is combined with the oxygen,  therefore allowing a deeper ventilation of the pulmonary tissue.  The patient suffers from high degrees of fatigue but his Epworth sleepiness score is  endorsed at 12 points -elevated but not greatly so.  His fatigue is scored at 59 points. The geriatric depression score was also obtained at 3 points.  In light of the patient's medical history of pneumonia versus pneumonitis frequent sinus disease, bronchitis, and chronic kidney disease with IgA nephropathy, cardiac rhythm abnormalities and endocrinopathies such as diabetes mellitus and hyperlipidemia I am concerned about an autoimmune disorder affecting multiple systems. The patient states that he also has been diagnosed with an enlarged fatty liver. The patient has decreased  visual acuity and developed cataracts at age 61. He was lifelong extremely near sided.          Last visit note:  Donald [name] reports that he was evaluated in April 2013 by  Polysomnography, about 2 a month after he had a bout of pneumonia. His cardiologist Dr. [name] to referred him for the sleep study, which was performed at the [facility] and sleep [facility] on [address]. The patient followed up with Dr. [name] clowns of the [facility]. The sleep study was only mildly positive for Dr. sleep apnea an oral appliance was discussed bursal CPAP he was. An overnight pulse oximetry revealed that the patient also had nocturnal hypoxia and this in return that to Dr. [name] prescribing oxygen supplement at night for the patient's use. I would also like to at that the patient is a diagnosis of asthma. Otherwise he was repaired in his early 18s had cervical spine fusion by Dr. Ronnald Ramp, and abdominal hernia that was surgically treated ,  left  and right eye cataract., and has had a deviated septum repair.   The patient reports that he became more concerned after he had entertained to discontinue the use of oxygen pounds use the pulse oximetry on the fingers of his left hand while watching TV and pills in, balding he has pulse oximetry decreased to the mid 70s. The patient reports that his blood pressure between left and right arm however does not very much and that he not a doesn't  get cold or bluish hands or fingers. The patient also reports that he is able to hold his breath when swimming and diving for prolonged periods of time, he seems not to respond versus a Burch to take the next breath. His wife has also noticed that he seems to breathe extremely shallow when he rests. He sleeps in his recliner however her not in the bad and this may be due to discomfort when reclining completely.  The patient was to bed between 10 and 11 PM, he arises around 7 AM, he estimates is or old nocturnal sleep time on average  at 8 hours.  She has occasional nocturnal arousals sometimes she will bleed for a while until he falls back asleep. He will have one rarely to bathroom breaks at night.  He endorses fatigue severity score today at 55 points and his were sleepiness score at 13 points, the EGD as depression score was endorsed at 12 , and  is elevated at 12 points.  He has also noticed that he gets morning headaches or nocturnal headaches when sleeping in bed on either side. He feels that his sinuses stop up his neck nasal breathing and that this causes a great deal of pressure.  I was able to review his overnight pulse oximetry year which was dated 4-to-13 and was performed on room air.  The patient's h rule still the highest at 101 bpm  , the lowest was 62 beats per minute, his oxygen levels stayed under 88% for 7 hours 57 minutes 20 seconds at night-  with a total recording time of 8.5 hours.   The patient also has a history of PVCs and  is well aware of it,  which may explain his palpitations , his heart rate  The patient has a history of elevated liver enzymes, hyperlipidemia, hypertension, IgA nephropathy, diabetes with renal manifestation 2 elevated prostate-specific antigen he also reports urge, palpitations fatigue and some degenerative bone disease joint disease.    Review of Systems: Out of a complete 14 system review, the patient complains of only the following symptoms, and all other reviewed systems are negative. Fatigue , depression,    History   Social History  . Marital Status: Married    Spouse Name: Donald [name]    Number of Children: 1  . Years of Education: 14   Occupational History  . Government social research officer    Social History Main Topics  . Smoking status: Never Smoker   . Smokeless tobacco: Never Used  . Alcohol Use: No  . Drug Use: Not on file  . Sexual Activity: Not on file   Other Topics Concern  . Not on file   Social History Narrative   Patient is married (Donald [name]) and lives at home  with his wife.   Patient has one child.   Patient is working full-time.   Patient has a Geophysicist/field seismologist.   Patient is left-handed.   Patient drinks one glass of tea daily, one soda daily.    Family History  Problem Relation Age of Onset  . Coronary artery disease Father   . Diabetes type II Father   . Heart attack Father     Past Medical History  Diagnosis Date  . Diabetes mellitus   . Hypertension   . Hyperlipidemia   . Pneumonia   . Sinusitis   . Bronchitis   . Asthma   . Allergic rhinitis, seasonal   . Chronic kidney disease, stage I   . IgA nephropathy   .  Degenerative joint disease of knee     bilateral  . GERD (gastroesophageal reflux disease)   . Heart murmur     history of  . History of hematuria   . History of acute prostatitis   . OSA on CPAP     Past Surgical History  Procedure Laterality Date  . Cervical spine surgery    . Hernia repair    . Deviated septum  1930s  . Tonsillectomy    . Cardiac catheterization  09/29/2011    normal  . Nm myoview ltd  07/08/2011    mild LV dilatation,mild septal hypokinesia  . US echocardiography  09/07/2011    EF 35-45%,mod.ant hypokinesis,boderline LA & RA enlargement,trace MR,TR & PI    Current Outpatient Prescriptions  Medication Sig Dispense Refill  . acetaminophen (TYLENOL) 500 MG tablet Take 500 mg by mouth every 6 (six) hours as needed for pain.      Marland Kitchen albuterol (PROVENTIL HFA;VENTOLIN HFA) 108 (90 BASE) MCG/ACT inhaler Inhale 2 puffs into the lungs every 6 (six) hours as needed. For shortness of breath      . aspirin EC 81 MG tablet Take 81 mg by mouth daily.      . budesonide-formoterol (SYMBICORT) 160-4.5 MCG/ACT inhaler Inhale 2 puffs into the lungs 2 (two) times daily as needed. For wheezing      . buPROPion (WELLBUTRIN XL) 150 MG 24 hr tablet Take 150 mg by mouth daily.      Marland Kitchen diltiazem (CARDIZEM CD) 240 MG 24 hr capsule Take 240 mg by mouth daily.      . DULoxetine (CYMBALTA) 60 MG capsule Take 60 mg by  mouth daily.      . fenofibrate 160 MG tablet Take 160 mg by mouth daily.      . fexofenadine (ALLEGRA) 180 MG tablet Take 180 mg by mouth daily. As needed      . fluticasone (FLONASE) 50 MCG/ACT nasal spray Place 2 sprays into the nose daily as needed. Allergies      . glimepiride (AMARYL) 1 MG tablet Take 1 mg by mouth daily before breakfast.      . metFORMIN (GLUCOPHAGE) 1000 MG tablet Take 1,000 mg by mouth 2 (two) times daily with a meal.      . nebivolol (BYSTOLIC) 10 MG tablet Take 10 mg by mouth every evening.      . olmesartan-hydrochlorothiazide (BENICAR HCT) 40-12.5 MG per tablet Take 1 tablet by mouth every evening.      Marland Kitchen omeprazole (PRILOSEC) 20 MG capsule Take 20 mg by mouth daily.       No current facility-administered medications for this visit.    Allergies as of 06/13/2013 - Review Complete 06/13/2013  Allergen Reaction Noted  . Ciprofloxacin  07/06/2011  . Levofloxacin  07/06/2011    Vitals: BP 120/77  Pulse 76  Resp 17  Ht 5' 8.75" (1.746 m)  Wt 181 lb (82.101 kg)  BMI 26.93 kg/m2 Last Weight:  Wt Readings from Last 1 Encounters:  06/13/13 181 lb (82.101 kg)   Last Height:   Ht Readings from Last 1 Encounters:  06/13/13 5' 8.75" (1.746 m)    Physical exam:  General: The patient is awake, alert and appears not in acute distress. The patient is well groomed. Head: Normocephalic, atraumatic. Neck is supple. Mallampati 1 , neck circumference:14  Cardiovascular:  Regular rate and rhythm without  murmurs or carotid bruit, and with rare extra beats -without distended neck veins.  Respiratory: Lungs are clear  to auscultation. Skin:  Without evidence of edema, has  facial redness. He has axillary freckles.  He has small freckles all over abdomen and back.  Trunk: BMI is  Elevated, the  patient has normal posture.  Neurologic exam : The patient is awake and alert, oriented to place and time.  Memory subjective   described as intact. There is a high degree of  fatigue.   Speech is fluent without  dysarthria, dysphonia or aphasia. Mood and affect are appropriate.  Cranial nerves: Pupils are equal and briskly reactive to light. Funduscopic exam without  evidence of pallor or edema. Extraocular movements  in vertical and horizontal planes intact and without nystagmus. Visual fields by finger perimetry are limited , there is significant loss of visual acuity.   Left eye ptosis. Macular degeneration, central blind spot.  Hearing to finger rub intact.  Facial sensation intact to fine touch.  Facial motor strength is not symmetric, and right face is droopy-  and tongue and uvula move midline.mallompatti is grade 1.   Motor exam:   Normal tone and normal muscle bulk and symmetric normal strength in all extremities.  Sensory:  Fine touch, pinprick and vibration were tested in all extremities. Proprioception is normal.  Coordination: Rapid alternating movements in the fingers/hands is tested and normal. Finger-to-nose maneuver tested and normal without evidence of ataxia, dysmetria or tremor.  Gait and station: Patient walks without assistive device and is able and assisted stool climb up to the exam table. Strength within normal limits. Stance is stable and normal. Tandem gait is  unfragmented. Romberg testing is normal.  Deep tendon reflexes: in the  upper and lower extremities are symmetric and intact. Babinski  downgoing.   Assessment:  After physical and neurologic examination, review of laboratory studies, imaging,  neurophysiology testing and pre-existing records, assessment is that of a patient with mild apnea and severe hypoxemia, requiring oxygen supplementation at night.    Which in return can cause fatigue, he needs to use the oxygen nightly -  Offered to combine this with PAP.Marland Kitchen   Plan:  Treatment plan and additional workup : Return for CPAP /BiPAP in conjunction with oxygen to improve oxygenation.  MRI brain was without evidence of strokes.  Depression is present.  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 Limited medication tolerance.  Evaluate with protein phoresis , MGUS ? Poems?  Severe limited vision. No falimy history of mitochondrial disease.  SPLIT study with MEDCOST - criteria , CO2 measures and oxygen to be given if possible to check on hypoxemia. He has co-morbidities, lung , kidney , memory ... SPLIT at lowest allowed and score at 3%.

## 2013-06-13 NOTE — Addendum Note (Signed)
Addended by: Larey Seat on: 06/13/2013 03:04 PM   Modules accepted: Orders

## 2013-06-27 ENCOUNTER — Encounter (INDEPENDENT_AMBULATORY_CARE_PROVIDER_SITE_OTHER): Payer: Self-pay

## 2013-06-27 ENCOUNTER — Ambulatory Visit (INDEPENDENT_AMBULATORY_CARE_PROVIDER_SITE_OTHER): Payer: PRIVATE HEALTH INSURANCE | Admitting: Neurology

## 2013-06-27 DIAGNOSIS — M79609 Pain in unspecified limb: Secondary | ICD-10-CM

## 2013-06-27 DIAGNOSIS — N049 Nephrotic syndrome with unspecified morphologic changes: Secondary | ICD-10-CM

## 2013-06-27 DIAGNOSIS — M545 Low back pain, unspecified: Secondary | ICD-10-CM

## 2013-06-27 DIAGNOSIS — G629 Polyneuropathy, unspecified: Secondary | ICD-10-CM

## 2013-06-27 DIAGNOSIS — Z0289 Encounter for other administrative examinations: Secondary | ICD-10-CM

## 2013-06-27 DIAGNOSIS — R0902 Hypoxemia: Secondary | ICD-10-CM

## 2013-06-27 DIAGNOSIS — J849 Interstitial pulmonary disease, unspecified: Secondary | ICD-10-CM

## 2013-06-27 NOTE — Procedures (Signed)
     HISTORY:  Donald [name] is a 62 year old gentleman with a history of diabetes and a history of a IgA nephropathy. The patient has occasional low back pains, and occasional discomfort in the feet without overt numbness. The patient denies any balance issues. The patient is being evaluated for a possible neuropathy or a lumbosacral radiculopathy.  NERVE CONDUCTION STUDIES:  Nerve conduction studies were performed on both lower extremities. The distal motor latencies and motor amplitudes for the peroneal and posterior tibial nerves were within normal limits. The nerve conduction velocities for these nerves were also normal. The H reflex latencies were normal. The sensory latencies for the peroneal nerves were within normal limits.   EMG STUDIES:  EMG study was performed on the right lower extremity:  The tibialis anterior muscle reveals 2 to 4K motor units with full recruitment. No fibrillations or positive waves were seen. The peroneus tertius muscle reveals 2 to 4K motor units with full recruitment. No fibrillations or positive waves were seen. The medial gastrocnemius muscle reveals 1 to 3K motor units with full recruitment. No fibrillations or positive waves were seen. The vastus lateralis muscle reveals 2 to 4K motor units with full recruitment. No fibrillations or positive waves were seen. The iliopsoas muscle reveals 2 to 4K motor units with full recruitment. No fibrillations or positive waves were seen. The biceps femoris muscle (long head) reveals 2 to 4K motor units with full recruitment. No fibrillations or positive waves were seen. The lumbosacral paraspinal muscles were tested at 3 levels, and revealed no abnormalities of insertional activity at all 3 levels tested. There was good relaxation.   IMPRESSION:  Nerve conduction studies done on both lower extremities were unremarkable. No evidence of a peripheral neuropathy is seen. A small fiber neuropathy may be missed by a  standard nerve conduction studies, however. Clinical correlation is required. EMG evaluation of the right lower extremity was unremarkable, without evidence of an overlying lumbosacral radiculopathy.  Jill Alexanders MD 06/27/2013 4:32 PM  [facility] [address] [place], [place] [address]  Phone [phone] Fax [phone]

## 2013-07-04 ENCOUNTER — Ambulatory Visit (INDEPENDENT_AMBULATORY_CARE_PROVIDER_SITE_OTHER): Payer: PRIVATE HEALTH INSURANCE

## 2013-07-04 DIAGNOSIS — R0902 Hypoxemia: Secondary | ICD-10-CM

## 2013-07-04 DIAGNOSIS — G4733 Obstructive sleep apnea (adult) (pediatric): Secondary | ICD-10-CM

## 2013-07-04 DIAGNOSIS — J849 Interstitial pulmonary disease, unspecified: Secondary | ICD-10-CM

## 2013-07-08 ENCOUNTER — Encounter: Payer: Self-pay | Admitting: *Deleted

## 2013-07-08 ENCOUNTER — Telehealth: Payer: Self-pay | Admitting: Neurology

## 2013-07-08 DIAGNOSIS — G4733 Obstructive sleep apnea (adult) (pediatric): Secondary | ICD-10-CM

## 2013-07-08 NOTE — Telephone Encounter (Signed)
I called and spoke with the patient about his sleep study results. I informed the patient that he did well on CPAP and that Dr. Brett Fairy recommends starting CPAP therapy at home. I will fax the order to [place] per patient, fax a copy of the report to Dr. Danna Hefty office and mail a copy to the patient along with a follow up instruction letter.

## 2013-07-09 NOTE — Progress Notes (Signed)
Quick Note:  Left message that EMG/NCV was unremarkable, no neuropathy or radiculopathy found, per Dr. Brett Fairy. ______

## 2013-08-04 ENCOUNTER — Other Ambulatory Visit (INDEPENDENT_AMBULATORY_CARE_PROVIDER_SITE_OTHER): Payer: Self-pay

## 2013-08-04 DIAGNOSIS — Z0289 Encounter for other administrative examinations: Secondary | ICD-10-CM

## 2013-08-06 LAB — PROTEIN ELECTROPHORESIS, SERUM
A/G Ratio: 1.5 (ref 0.7–2.0)
ALPHA 1: 0.2 g/dL (ref 0.1–0.4)
ALPHA 2: 0.7 g/dL (ref 0.4–1.2)
Albumin ELP: 4 g/dL (ref 3.2–5.6)
Beta: 1.2 g/dL (ref 0.6–1.3)
GLOBULIN, TOTAL: 2.7 g/dL (ref 2.0–4.5)
Gamma Globulin: 0.6 g/dL (ref 0.5–1.6)
TOTAL PROTEIN: 6.7 g/dL (ref 6.0–8.5)

## 2013-08-06 LAB — CBC WITH DIFFERENTIAL/PLATELET
Basophils Absolute: 0.1 10*3/uL (ref 0.0–0.2)
Basos: 1 %
EOS: 4 %
Eosinophils Absolute: 0.4 10*3/uL (ref 0.0–0.4)
HEMATOCRIT: 43 % (ref 37.5–51.0)
HEMOGLOBIN: 14.8 g/dL (ref 12.6–17.7)
IMMATURE GRANULOCYTES: 0 %
Immature Grans (Abs): 0 10*3/uL (ref 0.0–0.1)
LYMPHS ABS: 2.6 10*3/uL (ref 0.7–3.1)
Lymphs: 31 %
MCH: 28.6 pg (ref 26.6–33.0)
MCHC: 34.4 g/dL (ref 31.5–35.7)
MCV: 83 fL (ref 79–97)
MONOCYTES: 7 %
Monocytes Absolute: 0.6 10*3/uL (ref 0.1–0.9)
Neutrophils Absolute: 4.9 10*3/uL (ref 1.4–7.0)
Neutrophils Relative %: 57 %
RBC: 5.17 x10E6/uL (ref 4.14–5.80)
RDW: 14 % (ref 12.3–15.4)
WBC: 8.4 10*3/uL (ref 3.4–10.8)

## 2013-08-14 NOTE — Progress Notes (Signed)
Quick Note:  Shared normal blood cell count thru VM message ______

## 2013-09-01 ENCOUNTER — Encounter: Payer: Self-pay | Admitting: Neurology

## 2013-09-04 ENCOUNTER — Encounter: Payer: Self-pay | Admitting: Neurology

## 2014-04-13 ENCOUNTER — Telehealth: Payer: Self-pay | Admitting: *Deleted

## 2014-04-13 NOTE — Telephone Encounter (Signed)
Calling about RV (30 minutes) needed for this pt.  Request of Dr. Brett Fairy.

## 2014-04-15 NOTE — Telephone Encounter (Signed)
Pt called back and [employer]. Made appt in January for pt.   Dr. Brett Fairy aware and ok the January appt. (06-04-14).

## 2014-04-16 ENCOUNTER — Encounter (HOSPITAL_COMMUNITY): Payer: Self-pay | Admitting: Cardiovascular Disease

## 2014-05-25 DIAGNOSIS — J4520 Mild intermittent asthma, uncomplicated: Secondary | ICD-10-CM | POA: Insufficient documentation

## 2014-06-01 ENCOUNTER — Other Ambulatory Visit: Payer: Self-pay | Admitting: Internal Medicine

## 2014-06-01 DIAGNOSIS — R0781 Pleurodynia: Secondary | ICD-10-CM

## 2014-06-01 DIAGNOSIS — J4520 Mild intermittent asthma, uncomplicated: Secondary | ICD-10-CM

## 2014-06-04 ENCOUNTER — Encounter: Payer: Self-pay | Admitting: Neurology

## 2014-06-04 ENCOUNTER — Ambulatory Visit (INDEPENDENT_AMBULATORY_CARE_PROVIDER_SITE_OTHER): Payer: Commercial Managed Care - PPO | Admitting: Neurology

## 2014-06-04 VITALS — BP 120/85 | HR 88 | Resp 16 | Ht 68.0 in | Wt 177.0 lb

## 2014-06-04 DIAGNOSIS — G4733 Obstructive sleep apnea (adult) (pediatric): Secondary | ICD-10-CM | POA: Diagnosis not present

## 2014-06-04 DIAGNOSIS — J189 Pneumonia, unspecified organism: Secondary | ICD-10-CM | POA: Diagnosis not present

## 2014-06-04 DIAGNOSIS — Z9981 Dependence on supplemental oxygen: Secondary | ICD-10-CM

## 2014-06-04 DIAGNOSIS — Z9989 Dependence on other enabling machines and devices: Secondary | ICD-10-CM

## 2014-06-04 DIAGNOSIS — R0902 Hypoxemia: Secondary | ICD-10-CM | POA: Diagnosis not present

## 2014-06-04 NOTE — Progress Notes (Signed)
[facility]  Provider:  Larey Seat, M D  Referring Provider: [name], MD Primary Care Physician:  [name], MD  Chief Complaint  Patient presents with  . RV cpap     Rm 10, wife    HPI:  Donald [name] is a 64 y.o. male  Is seen here as a referral/ revisit  from [name] after  PSG for presumed OSA re -evaluation.   This patient underwent a regular polysomnography on 04-25-13.  He reached an apnea hypotony index of only 5.7 and in our GI of 7.5, during dream sleep the REM AHI was 20.3 and in supine sleep the AHI was only 4.2. This seemed to be nausea planes primary complement to his sleep disorder the most concerning finding was a little oxygen saturation is in needier at 77% and was a total time in desaturation off to one at 89 minutes oxygen was supplemented during this polysomnographic study in increased to 2 L per minute from initially 1 L. I discussed today with the patient the results that would not necessarily feel that he needs to be on CPAP but I strongly urged him to use oxygen at night.   However some patients have reported improvement in shortness of breath if CPAP is combined with the oxygen,  therefore allowing a deeper ventilation of the pulmonary tissue.  The patient suffers from high degrees of fatigue but his Epworth sleepiness score is  endorsed at 12 points -elevated but not greatly so.  His fatigue is scored at 59 points. The geriatric depression score was also obtained at 3 points.  In light of the patient's medical history of pneumonia versus pneumonitis frequent sinus disease, bronchitis, and chronic kidney disease with IgA nephropathy, cardiac rhythm abnormalities and endocrinopathies such as diabetes mellitus and hyperlipidemia I am concerned about an autoimmune disorder affecting multiple systems. The patient states that he also has been diagnosed with an enlarged fatty liver. The patient has decreased visual acuity and  developed cataracts at age 89. He was lifelong extremely near sided.  Return for CPAP /BiPAP in conjunction with oxygen to improve oxygenation.  MRI brain was without evidence of strokes. Depression is present.  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 Limited medication tolerance.  Evaluate with protein phoresis , MGUS ? Poems?  Severe limited vision. No falimy history of mitochondrial disease.  SPLIT study with MEDCOST - criteria , CO2 measures and oxygen to be given if possible to check on hypoxemia. He has co-morbidities, lung , kidney , memory ... SPLIT at lowest allowed and score at 3%.    Last visit note: [name] reports that he was evaluated in April 2013 by  Polysomnography, about 2 a month after he had a bout of pneumonia. His cardiologist Dr. [name] to referred him for the sleep study, which was performed at the heart and sleep [facility] on [facility]. The patient followed up with Dr. [name] clowns of the [facility] pul[facility]ology group. The sleep study was only mildly positive for Dr. sleep apnea an oral appliance was discussed bursal CPAP he was. An overnight pulse oximetry revealed that the patient also had nocturnal hypoxia and this in return that to Dr. [name] prescribing oxygen supplement at night for the patient's use. I would also like to at that the patient is a diagnosis of asthma. Otherwise he was repaired in his early 59s had cervical spine fusion by Dr. Ronnald Ramp, and abdominal hernia that was surgically treated ,  left  and right eye cataract., and has had a deviated septum repair.   The patient reports that he became more concerned after he had entertained to discontinue the use of oxygen pounds use the pulse oximetry on the fingers of his left hand while watching TV and pills in, balding he has pulse oximetry decreased to the mid 70s. The patient reports that his blood pressure between left and right arm however does not very much and that he not a  doesn't get cold or bluish hands or fingers. The patient also reports that he is able to hold his breath when swimming and diving for prolonged periods of time, he seems not to respond versus a Burch to take the next breath. His wife has also noticed that he seems to breathe extremely shallow when he rests. He sleeps in his recliner however her not in the bad and this may be due to discomfort when reclining completely.  The patient was to bed between 10 and 11 PM, he arises around 7 AM, he estimates is or old nocturnal sleep time on average at 8 hours.  She has occasional nocturnal arousals sometimes she will bleed for a while until he falls back asleep. He will have one rarely to bathroom breaks at night. He endorses fatigue severity score today at 55 points and his were sleepiness score at 13 points, the EGD as depression score was endorsed at 12 , and  is elevated at 12 points. He has also noticed that he gets morning headaches or nocturnal headaches when sleeping in bed on either side. He feels that his sinuses stop up his neck nasal breathing and that this causes a great deal of pressure. I was able to review his overnight pulse oximetry year which was dated 4-to-13 and was performed on room air.  The patient's h rule still the highest at 101 bpm  , the lowest was 62 beats per minute, his oxygen levels stayed under 88% for 7 hours 57 minutes 20 seconds at night-  with a total recording time of 8.5 hours. The patient also has a history of PVCs and  is well aware of it,  which may explain his palpitations , his heart rateThe patient has a history of elevated liver enzymes, hyperlipidemia, hypertension, IgA nephropathy, diabetes with renal manifestation 2 elevated prostate-specific antigen he also reports urge, palpitations fatigue and some degenerative bone disease joint disease.    06-04-14  Donald [name] is here today for his yearly revisit. He has not been able to comply with CPAP use by  insurance criteria his average use when using CPAP was 5 hours 19 minutes and his AHI was 2.5 however is compliance was reduced to 10%. He reports that he feels as if he is the CPAP " blows up his cheeks "and he has no problem with the air passage through the nose.  But he seems to store the air in some way instead of inhaling it. His fatigue severity score was 41 and his sleepiness score 4 points these are not as concerning. I reviewed his medication he is on Symbicort, Allegra as needed Flonase Proventil inhaler.  Given the patient's report I would like for him to use an out to titrate her I would like the machine to be set between 5 and 10 cm water with the intent to find the best tolerated pressure. He is followed by [employer] is a DME and if a comfortable pressure has been found for the patient we  will investigate if he needs a different kind of mask or not. The patient also reports that he wakes up and is unable to fall asleep again from the experience of air leakage.  Review of Systems: Out of a complete 14 system review, the patient complains of only the following symptoms, and all other reviewed systems are negative. Fatigue , depression,    History   Social History  . Marital Status: Married    Spouse Name: Diane    Number of Children: 1  . Years of Education: 14   Occupational History  . Government social research officer    Social History Main Topics  . Smoking status: Never Smoker   . Smokeless tobacco: Never Used  . Alcohol Use: No  . Drug Use: Not on file  . Sexual Activity: Not on file   Other Topics Concern  . Not on file   Social History Narrative   Patient is married (Diane) and lives at home with his wife.   Patient has one child.   Patient is working full-time.   Patient has a Geophysicist/field seismologist.   Patient is left-handed.   Patient drinks one glass of tea daily, one soda daily.    Family History  Problem Relation Age of Onset  . Coronary artery disease Father   . Diabetes  type II Father   . Heart attack Father     Past Medical History  Diagnosis Date  . Diabetes mellitus   . Hypertension   . Hyperlipidemia   . Pneumonia   . Sinusitis   . Bronchitis   . Asthma   . Allergic rhinitis, seasonal   . Chronic kidney disease, stage I   . IgA nephropathy   . Degenerative joint disease of knee     bilateral  . GERD (gastroesophageal reflux disease)   . Heart murmur     history of  . History of hematuria   . History of acute prostatitis   . OSA on CPAP     Past Surgical History  Procedure Laterality Date  . Cervical spine surgery    . Hernia repair    . Deviated septum  1930s  . Tonsillectomy    . Cardiac catheterization  09/29/2011    normal  . Nm myoview ltd  07/08/2011    mild LV dilatation,mild septal hypokinesia  . US echocardiography  09/07/2011    EF 35-45%,mod.ant hypokinesis,boderline LA & RA enlargement,trace MR,TR & PI  . Left heart catheterization with coronary angiogram N/A 09/29/2011    Procedure: LEFT HEART CATHETERIZATION WITH CORONARY ANGIOGRAM;  Surgeon: Sanda Klein, MD;  Location: [facility];  Service: Cardiovascular;  Laterality: N/A;    Current Outpatient Prescriptions  Medication Sig Dispense Refill  . acetaminophen (TYLENOL) 500 MG tablet Take 500 mg by mouth every 6 (six) hours as needed for pain.    Marland Kitchen albuterol (PROVENTIL HFA;VENTOLIN HFA) 108 (90 BASE) MCG/ACT inhaler Inhale 2 puffs into the lungs every 6 (six) hours as needed. For shortness of breath    . aspirin EC 81 MG tablet Take 81 mg by mouth daily.    . budesonide-formoterol (SYMBICORT) 160-4.5 MCG/ACT inhaler Inhale 2 puffs into the lungs 2 (two) times daily as needed. For wheezing    . buPROPion (WELLBUTRIN XL) 150 MG 24 hr tablet Take 150 mg by mouth daily.    . cefdinir (OMNICEF) 300 MG capsule   0  . diltiazem (CARDIZEM CD) 240 MG 24 hr capsule Take 240 mg by mouth daily.    Marland Kitchen  DULoxetine (CYMBALTA) 60 MG capsule Take 60 mg by mouth daily.    . fluticasone  (FLONASE) 50 MCG/ACT nasal spray Place 2 sprays into the nose daily as needed. Allergies    . glimepiride (AMARYL) 1 MG tablet Take 1 mg by mouth daily before breakfast.    . HYDROcodone-acetaminophen (NORCO/VICODIN) 5-325 MG per tablet   0  . metFORMIN (GLUCOPHAGE) 1000 MG tablet Take 1,000 mg by mouth 2 (two) times daily with a meal.    . nebivolol (BYSTOLIC) 10 MG tablet Take 10 mg by mouth every evening.    . olmesartan-hydrochlorothiazide (BENICAR HCT) 40-12.5 MG per tablet Take 1 tablet by mouth every evening.    Marland Kitchen omeprazole (PRILOSEC) 20 MG capsule Take 20 mg by mouth daily.     No current facility-administered medications for this visit.    Allergies as of 06/04/2014 - Review Complete 06/04/2014  Allergen Reaction Noted  . Ciprofloxacin  07/06/2011  . Levofloxacin  07/06/2011    Vitals: BP 120/85 mmHg  Pulse 88  Resp 16  Ht 5\' 8"  (1.727 m)  Wt 177 lb (80.287 kg)  BMI 26.92 kg/m2 Last Weight:  Wt Readings from Last 1 Encounters:  06/04/14 177 lb (80.287 kg)   Last Height:   Ht Readings from Last 1 Encounters:  06/04/14 5\' 8"  (1.727 m)    Physical exam:  General: The patient is awake, alert and appears not in acute distress. The patient is well groomed. Head: Normocephalic, atraumatic. Neck is supple. Mallampati 1 , neck circumference:14  Cardiovascular:  Regular rate and rhythm without  murmurs or carotid bruit, and with rare extra beats -without distended neck veins.  Respiratory: Lungs are clear to auscultation. Skin:  Without evidence of edema, has  facial redness. He has axillary freckles.  He has small freckles all over abdomen and back.  Trunk: BMI is  Elevated, the  patient has normal posture.  Neurologic exam : The patient is awake and alert, oriented to place and time.  Memory subjective   described as intact. There is a high degree of fatigue.   Speech is fluent without  dysarthria, dysphonia or aphasia. Mood and affect are appropriate.  Cranial  nerves: Pupils are equal and briskly reactive to light. Funduscopic exam without  evidence of pallor or edema. Extraocular movements  in vertical and horizontal planes intact and without nystagmus. Visual fields by finger perimetry are limited , there is significant loss of visual acuity.   Left eye ptosis. Macular degeneration, central blind spot.  Hearing to finger rub intact.  Facial sensation intact to fine touch.  Facial motor strength is not symmetric, and right face is droopy-  and tongue and uvula move midline.mallompatti is grade 1.   Motor exam:   Normal tone and normal muscle bulk and symmetric normal strength in all extremities.  Sensory:  Fine touch, pinprick and vibration were tested in all extremities. Proprioception is normal.  Coordination: Rapid alternating movements in the fingers/hands is tested and normal. Finger-to-nose maneuver tested and normal without evidence of ataxia, dysmetria or tremor.  Gait and station: Patient walks without assistive device and is able and assisted stool climb up to the exam table. Strength within normal limits. Stance is stable and normal. Tandem gait is  unfragmented. Romberg testing is normal.  Deep tendon reflexes: in the  upper and lower extremities are symmetric and intact. Babinski  downgoing.   Assessment:  After physical and neurologic examination, review of laboratory studies, imaging,  neurophysiology testing and  pre-existing records, assessment is that of a patient with   1) mild apnea and severe hypoxemia, requiring oxygen supplementation at night. He has had several bouts of Bronchitis.   2) non compliance with CPAP due to disturbing ir leaks and " puffing up of his cheeks".   During today's visit I had a long discussion with Mr. Winch and he described some of his symptoms in an unusual way. First of all he is aware and often awake at night for his oxygen level to drop so they're not strictly related to sleep apnea in the first  place. He has never been given a clear diagnosis of bronchitis, pneumonia, fibrosis but he knows that he did not suffer pulmonary emboli. When he is hypoxemic usually by using 2 L of oxygen from a concentrator he will bring his oxygen level up to the lower mid 90s , the CPAP machine alone which has not been connected with his oxygen supply can bring him up to high 80s. He does not develop air hungryhe is not short of breath and even with exertion is not necessarily short of breath. He never got subs without significant strain. I do wonder if there is a central trigger or better a central no longer working central trigger that prevents him from developing shortness of breath in spite of dropping oxygen concentration. It would be beneficial to do a hypercapnia study was and I'm not sure if our pulmonology department can provide a ambulatory hypercapnia test at home but I would asked Dr. Chase Caller if this could be done.  Plan:  Treatment plan and additional workup :  auto titration  5-10 cm water , new mask fit - [facility]. I will ask [employer] to combine oxygen and CPAP to one interface

## 2014-06-04 NOTE — Patient Instructions (Signed)
Idiopathic Pulmonary Fibrosis Idiopathic pulmonary fibrosis is an inflammation (soreness and irritation) in the lungs which eventually causes scarring. This usually shows in middle age over a several year time period. The cause is unknown (idiopathic). Usually death occurs after several years but there are no time tables which will predict perfectly what the course of the illness will be. It affects males and females equally. In this condition there is a formation of fibrous (tough leathery) tissue in the small ducts which carry air to and from your lungs. Because of this, the lungs do not work as well as they should for taking in oxygen from the air you breathe and getting rid of wastes (carbon dioxide). Because the lungs are damaged, there may be more problems with infections or the heart.  Other problems may include pulmonary hypertension (high blood pressure in the lungs), and formation of blood clots. Some symptoms of this illness are:  Coughing and breathing difficulties; cough is usually dry and hacking.  Bluish (cyanotic) skin and lips due to lack of circulating oxygen.  Loss of appetite.  Loss of strength which comes from just the increased work of breathing.  Rapid, shallow breathing occur with moderate exercise and later even while resting.  Increasing shortness of breath (dyspnea) which progresses as the disease gets worse.  Weight loss and fatigue due partly to the increased work of breathing.  Clubbing of the fingers (the ends of the fingers become rounded and enlarged). DIAGNOSIS  A diagnosis of idiopathic pulmonary fibrosis may be suspected based on exam and a patient's history.  Specialized X-rays, pulmonary function tests, pulse oximetry, and laboratory tests including blood gasses help confirm the problem.  Sometimes a biopsy is done and a small piece of lung tissue is removed. This may be done through a bronchoscope or during an operation in which the chest is opened. This  is looked at under a microscope by a specialist who can tell what the lung problem is. CAUSES If the cause of pulmonary fibrosis is known, it is no longer known as idiopathic. Several causes of pulmonary fibrosis include:  Occupational and environmental exposures to asbestos, silica, and or metal dusts.  Illegal or street drug use.  Agricultural workers may inhale substances, such as moldy hay, which can cause an allergic reaction in the lung. This reaction is called Farmer's Lung and can cause pulmonary fibrosis. Some other fumes found on farms are directly toxic to the lungs.  Exposure of the lungs to radiation.  Collagen diseases.  Sarcoidosis is a disease which forms granulomas (areas of inflammatory cells), which can attack any area of the body but most frequently affects the lungs.  Drugs. Certain medicines may have the undesirable side effect of causing pulmonary fibrosis. Check with your doctor about the medicines you are taking and ask about any possible side effects.  Some cases of pulmonary fibrosis seem to be genetic. TREATMENT   There are no drugs currently approved for the treatment of pulmonary fibrosis. Steroids (a potent medication which cuts down on inflammation) are sometimes given to prevent lung changes before they become permanent. High doses may be recommended at first, followed by lower maintenance dosages. Other medications may be tried if steroids do not work.  Lung disease may be monitored with X-rays and laboratory work.  Oxygen may be helpful if oxygen in the blood is diminished. This improves the quality of life. Your caregiver will give you a prescription for this if it is helpful.  Antibiotics are used   for treatment of infections.  Exercise may be beneficial.  Lung transplants are being investigated and a single lung transplant may be considered for some patients.  Influenza vaccine and pneumococcal pneumonia vaccine are both recommended for people  with IPF or any lung disease. These two shots may help keep you healthy. Document Released: 07/15/2003 Document Revised: 07/17/2011 Document Reviewed: 04/24/2005 [employer] Patient Information 2015 [employer]. This information is not intended to replace advice given to you by your health care provider. Make sure you discuss any questions you have with your health care provider.  

## 2014-06-05 ENCOUNTER — Ambulatory Visit
Admission: RE | Admit: 2014-06-05 | Discharge: 2014-06-05 | Disposition: A | Discharge status: Home / Self Care | Payer: Commercial Managed Care - PPO | Source: Ambulatory Visit | Attending: Internal Medicine | Admitting: Internal Medicine

## 2014-06-05 DIAGNOSIS — R0781 Pleurodynia: Secondary | ICD-10-CM

## 2014-06-05 DIAGNOSIS — J4520 Mild intermittent asthma, uncomplicated: Secondary | ICD-10-CM

## 2014-06-05 MED ORDER — IOHEXOL 300 MG/ML  SOLN
75.0000 mL | Freq: Once | INTRAMUSCULAR | Status: AC | PRN
  Administered 2014-06-05: 75 mL via INTRAVENOUS

## 2014-06-11 ENCOUNTER — Ambulatory Visit (INDEPENDENT_AMBULATORY_CARE_PROVIDER_SITE_OTHER): Payer: Commercial Managed Care - PPO | Admitting: Internal Medicine

## 2014-06-11 ENCOUNTER — Encounter: Payer: Self-pay | Admitting: Internal Medicine

## 2014-06-11 ENCOUNTER — Other Ambulatory Visit (INDEPENDENT_AMBULATORY_CARE_PROVIDER_SITE_OTHER): Payer: Commercial Managed Care - PPO

## 2014-06-11 VITALS — BP 110/80 | HR 80 | Ht 68.0 in | Wt 181.0 lb

## 2014-06-11 DIAGNOSIS — J40 Bronchitis, not specified as acute or chronic: Secondary | ICD-10-CM

## 2014-06-11 DIAGNOSIS — R05 Cough: Secondary | ICD-10-CM

## 2014-06-11 DIAGNOSIS — J329 Chronic sinusitis, unspecified: Secondary | ICD-10-CM | POA: Insufficient documentation

## 2014-06-11 DIAGNOSIS — R0600 Dyspnea, unspecified: Secondary | ICD-10-CM | POA: Diagnosis not present

## 2014-06-11 DIAGNOSIS — R053 Chronic cough: Secondary | ICD-10-CM

## 2014-06-11 LAB — SEDIMENTATION RATE: Sed Rate: 18 mm/hr (ref 0–22)

## 2014-06-11 LAB — RHEUMATOID FACTOR: Rhuematoid fact SerPl-aCnc: <10 IU/mL (ref <=14)

## 2014-06-11 NOTE — Progress Notes (Signed)
Subjective:    Patient ID: Donald [name], male    DOB: 1950/06/09, 64 y.o.   MRN: [id number] PCP Hoyle Sauer, MD  HPI  IOV 06/11/2014  Chief Complaint  Patient presents with  . Pulmonary Consult    Pt referred by Dr. Felipa Eth for recurrent bronchitis. Pt stated he has been having s/s intermittenly for 3 months.    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 Accompanied by his wife. Referred by Dr. Felipa Eth for recurrent bronchitis and chronic cough. Somewhat of a poor historian and at best as I can gather he has a history of long-standing asthma for many years but he self describes this as mild intermittent and only uses Symbicort as needed. On this background baseline he gets episodic cough and chest congestion with chest tightness and wheezing perhaps 1 time a year which necessitates prednisone and antibiotics with good relief. For the year 2015 he thinks that he said prednisone course 1 time. His main episode in 2015 was at first in August 2015 during which time he had cough, gray colored sputum, sinus congestion and fatigue for one week without any fever or aches. He was treated with Z-Pak. Patient himself does not remember this episode. Per the referring physician charts chest x-ray  was normal.   The main issue appears to be that since December 2015 after an episode of sinus congestion and chest tightness he continues to have chronic cough and chest tightness.. This is described as mild at baseline but when he gets spasms it is severe. It is mostly dry but occasionally he does bring up white colored sputum. He was treated with prednisone in the early part of December 2015 according to his history for 1 week but the cough has lingered and that his main concern. Overall though he does say that his cough has improved in severity. Symptoms are associated with a feeling of something stuck in his throat which he attributes to a recent diagnosis of dislocated left sternoclavicular joint. He did  see his primary care physician again on 05/25/2014 and it is noted that he did have continued cough and low-grade fevers at this time associated with some whitish sputum. The concern at this time was that he was possibly getting hypoxemic when he would sit down and he would desaturate or low 90s or high 80s. Repeat chest x-ray again the stem was normal according to the review of chart. He was reporting that over the counter cough medications are not helping him as much. He was treated with a prednisone taper which he says that he did not get much help  This then prompted a CT scan of the chest in 01/04/2015 that essentially was clear except for "slight scarring" in the lung base  His spirometry today in our pulmonary office done 06/11/2014 shows an FEV1 of 2.25 L/65%, FVC of 2.04 L/69% with a ratio of 74/94% - suggestive of restrictin. However,  pulmonary function test 08/25/2011 was normal   Walking desta test - 185 feet x 3 laps: No desats (at hom ehe has nocturnal and siting desats to 70s but none during exertion per him)   has a past medical history of Diabetes mellitus; Hypertension; Hyperlipidemia; Pneumonia; Sinusitis; Bronchitis; Asthma; Allergic rhinitis, seasonal; Chronic kidney disease, stage I; IgA nephropathy; Degenerative joint disease of knee; GERD (gastroesophageal reflux disease); Heart murmur; History of hematuria; History of acute prostatitis; OSA on CPAP; and collar bone.   reports that he has never smoked. He has never used smokeless tobacco.  Past Surgical History  Procedure Laterality Date  . Cervical spine surgery    . Hernia repair    . Deviated septum  1930s  . Tonsillectomy    . Cardiac catheterization  09/29/2011    normal  . Nm myoview ltd  07/08/2011    mild LV dilatation,mild septal hypokinesia  . US echocardiography  09/07/2011    EF 35-45%,mod.ant hypokinesis,boderline LA & RA enlargement,trace MR,TR & PI  . Left heart catheterization with coronary angiogram N/A  09/29/2011    Procedure: LEFT HEART CATHETERIZATION WITH CORONARY ANGIOGRAM;  Surgeon: Sanda Klein, MD;  Location: [facility];  Service: Cardiovascular;  Laterality: N/A;    Allergies  Allergen Reactions  . Ciprofloxacin     Shoulder cramping  . Levofloxacin     cramping    Immunization History  Administered Date(s) Administered  . Influenza Split 02/05/2014  . Pneumococcal-Unspecified 03/08/2014    Family History  Problem Relation Age of Onset  . Coronary artery disease Father   . Diabetes type II Father   . Heart attack Father      Current outpatient prescriptions:  .  acetaminophen (TYLENOL) 500 MG tablet, Take 500 mg by mouth every 6 (six) hours as needed for pain., Disp: , Rfl:  .  albuterol (PROVENTIL HFA;VENTOLIN HFA) 108 (90 BASE) MCG/ACT inhaler, Inhale 2 puffs into the lungs every 6 (six) hours as needed. For shortness of breath, Disp: , Rfl:  .  aspirin EC 81 MG tablet, Take 81 mg by mouth daily., Disp: , Rfl:  .  budesonide-formoterol (SYMBICORT) 160-4.5 MCG/ACT inhaler, Inhale 2 puffs into the lungs 2 (two) times daily as needed. For wheezing, Disp: , Rfl:  .  buPROPion (WELLBUTRIN XL) 150 MG 24 hr tablet, Take 150 mg by mouth daily., Disp: , Rfl:  .  diltiazem (CARDIZEM CD) 240 MG 24 hr capsule, Take 240 mg by mouth daily., Disp: , Rfl:  .  DULoxetine (CYMBALTA) 60 MG capsule, Take 60 mg by mouth daily., Disp: , Rfl:  .  fluticasone (FLONASE) 50 MCG/ACT nasal spray, Place 2 sprays into the nose daily as needed. Allergies, Disp: , Rfl:  .  glimepiride (AMARYL) 1 MG tablet, Take 1 mg by mouth daily before breakfast., Disp: , Rfl:  .  HYDROcodone-acetaminophen (NORCO/VICODIN) 5-325 MG per tablet, , Disp: , Rfl: 0 .  metFORMIN (GLUCOPHAGE) 1000 MG tablet, Take 1,000 mg by mouth 2 (two) times daily with a meal., Disp: , Rfl:  .  nebivolol (BYSTOLIC) 10 MG tablet, Take 10 mg by mouth every evening., Disp: , Rfl:  .  olmesartan-hydrochlorothiazide (BENICAR HCT)  40-12.5 MG per tablet, Take 1 tablet by mouth every evening., Disp: , Rfl:  .  omeprazole (PRILOSEC) 20 MG capsule, Take 20 mg by mouth daily., Disp: , Rfl:      Review of Systems  Constitutional: Negative for fever and unexpected weight change.  HENT: Positive for congestion, rhinorrhea and trouble swallowing. Negative for dental problem, ear pain, nosebleeds, postnasal drip, sinus pressure, sneezing and sore throat.   Eyes: Negative for redness and itching.  Respiratory: Positive for cough, chest tightness and shortness of breath. Negative for wheezing.   Cardiovascular: Negative for palpitations and leg swelling.  Gastrointestinal: Negative for nausea and vomiting.  Genitourinary: Negative for dysuria.  Musculoskeletal: Negative for joint swelling.  Skin: Negative for rash.  Neurological: Negative for headaches.  Hematological: Does not bruise/bleed easily.  Psychiatric/Behavioral: Negative for dysphoric mood. The patient is not nervous/anxious.  Objective:   Physical Exam  Constitutional: He is oriented to person, place, and time. He appears well-developed and well-nourished. No distress.  HENT:  Head: Normocephalic and atraumatic.  Right Ear: External ear normal.  Left Ear: External ear normal.  Mouth/Throat: Oropharynx is clear and moist. No oropharyngeal exudate.  Post nasal drip +  Eyes: Conjunctivae and EOM are normal. Pupils are equal, round, and reactive to light. Right eye exhibits no discharge. Left eye exhibits no discharge. No scleral icterus.  Neck: Normal range of motion. Neck supple. No JVD present. No tracheal deviation present. No thyromegaly present.  Cardiovascular: Normal rate, regular rhythm and intact distal pulses.  Exam reveals no gallop and no friction rub.   No murmur heard. Pulmonary/Chest: Effort normal and breath sounds normal. No respiratory distress. He has no wheezes. He has no rales. He exhibits no tenderness.  ? Mild crackles at base    Abdominal: Soft. Bowel sounds are normal. He exhibits no distension and no mass. There is no tenderness. There is no rebound and no guarding.  Musculoskeletal: Normal range of motion. He exhibits no edema or tenderness.  Lymphadenopathy:    He has no cervical adenopathy.  Neurological: He is alert and oriented to person, place, and time. He has normal reflexes. No cranial nerve deficit. Coordination normal.  Skin: Skin is warm and dry. No rash noted. He is not diaphoretic. No erythema. No pallor.  Psychiatric: He has a normal mood and affect. His behavior is normal. Judgment and thought content normal.  Nursing note and vitals reviewed.   Filed Vitals:   06/11/14 1522  BP: 110/80  Pulse: 80  Height: $Remove'5\' 8"'kizpRiY$  (1.727 m)  Weight: 181 lb (82.101 kg)  SpO2: 96%         Assessment & Plan:     ICD-9-CM ICD-10-CM   1. Chronic cough 786.2 R05 Ambulatory referral to ENT     Sedimentation rate     ANA     Angiotensin converting enzyme     Anti-DNA antibody, double-stranded     Rheumatoid factor     Cyclic citrul peptide antibody, IgG     Anti-scleroderma antibody     ANCA Screen Reflex Titer     Mpo/pr-3 (anca) antibodies     Hypersensitivity pnuemonitis profile  2. Dyspnea 786.09 R06.00 Spirometry with Graph     Pulmonary Function Test     Sedimentation rate     ANA     Angiotensin converting enzyme     Anti-DNA antibody, double-stranded     Rheumatoid factor     Cyclic citrul peptide antibody, IgG     Anti-scleroderma antibody     ANCA Screen Reflex Titer     Mpo/pr-3 (anca) antibodies     Hypersensitivity pnuemonitis profile  3. Chronic sinusitis with recurrent bronchitis 473.9 J32.9 CT Maxillofacial LTD WO CM   37 J40 Ambulatory referral to ENT   Concern is he had restricive lung disease or ILD or autoimmne process. Unable to explain his symptom constellation fully    - do full PFT - call me when done [phone] so I can get back to you  - do CT sinus wo contrast  -  refer ENT for evaluation - do Serum: ESR, ANA, ACE, DS-DNA, RF, anti-CCP.  scl-70, ANCA screen, MPO and PR-3 antibiodies and Hypersensitivity Pneumonitis Panel   Followup  based on PFT and blood and sinus CT test result    Dr. Brand Males, M.D., [facility].C.P Pulmonary and Critical Care  Medicine Staff [place] [facility] Pager: [phone], If no answer or between  15:00h - 7:00h: call [phone]  07/05/2014 7:24 PM

## 2014-06-11 NOTE — Patient Instructions (Signed)
ICD-9-CM ICD-10-CM   1. Chronic cough 786.2 R05   2. Dyspnea 786.09 R06.00 Spirometry with Graph  3. Chronic sinusitis with recurrent bronchitis 473.9 J32.9    81 J40    - do full PFT - call me when done [phone] so I can get back to you  - do CT sinus wo contrast  - refer ENT for evaluation - do Serum: ESR, ANA, ACE, DS-DNA, RF, anti-CCP.  scl-70, ANCA screen, MPO and PR-3 antibiodies and Hypersensitivity Pneumonitis Panel   Followup  based on PFT and blood and sinus CT test result

## 2014-06-12 LAB — ANTI-SCLERODERMA ANTIBODY: Scleroderma (Scl-70) (ENA) Antibody, IgG: <1

## 2014-06-12 LAB — ANA: Anti Nuclear Antibody(ANA): NEGATIVE

## 2014-06-12 LAB — MPO/PR-3 (ANCA) ANTIBODIES
Myeloperoxidase Abs: <1
Serine Protease 3: <1

## 2014-06-12 LAB — ANCA SCREEN W REFLEX TITER
Atypical p-ANCA Screen: NEGATIVE
c-ANCA Screen: NEGATIVE
p-ANCA Screen: NEGATIVE

## 2014-06-12 LAB — ANGIOTENSIN CONVERTING ENZYME: Angiotensin-Converting Enzyme: 30 U/L (ref 8–52)

## 2014-06-12 LAB — CYCLIC CITRUL PEPTIDE ANTIBODY, IGG

## 2014-06-12 LAB — ANTI-DNA ANTIBODY, DOUBLE-STRANDED: ds DNA Ab: <1 IU/mL

## 2014-06-16 ENCOUNTER — Ambulatory Visit (INDEPENDENT_AMBULATORY_CARE_PROVIDER_SITE_OTHER)
Admission: RE | Admit: 2014-06-16 | Discharge: 2014-06-16 | Disposition: A | Discharge status: Home / Self Care | Payer: Commercial Managed Care - PPO | Source: Ambulatory Visit | Attending: Internal Medicine | Admitting: Internal Medicine

## 2014-06-16 DIAGNOSIS — J40 Bronchitis, not specified as acute or chronic: Secondary | ICD-10-CM

## 2014-06-16 DIAGNOSIS — J329 Chronic sinusitis, unspecified: Secondary | ICD-10-CM

## 2014-06-17 ENCOUNTER — Ambulatory Visit (HOSPITAL_COMMUNITY)
Admission: RE | Admit: 2014-06-17 | Discharge: 2014-06-17 | Disposition: A | Discharge status: Home / Self Care | Payer: Commercial Managed Care - PPO | Source: Ambulatory Visit | Attending: Internal Medicine | Admitting: Internal Medicine

## 2014-06-17 DIAGNOSIS — R0600 Dyspnea, unspecified: Secondary | ICD-10-CM | POA: Diagnosis present

## 2014-06-17 LAB — HYPERSENSITIVITY PNUEMONITIS PROFILE

## 2014-06-17 LAB — PULMONARY FUNCTION TEST
DL/VA % PRED: 94 %
DL/VA: 4.26 ml/min/mmHg/L
DLCO unc % pred: 71 %
DLCO unc: 21.18 ml/min/mmHg
FEF 25-75 POST: 2.58 L/s
FEF 25-75 Pre: 1.71 L/sec
FEF2575-%CHANGE-POST: 51 %
FEF2575-%PRED-POST: 96 %
FEF2575-%Pred-Pre: 63 %
FEV1-%CHANGE-POST: 11 %
FEV1-%PRED-POST: 81 %
FEV1-%Pred-Pre: 73 %
FEV1-POST: 2.69 L
FEV1-PRE: 2.42 L
FEV1FVC-%CHANGE-POST: 5 %
FEV1FVC-%PRED-PRE: 97 %
FEV6-%Change-Post: 6 %
FEV6-%Pred-Post: 83 %
FEV6-%Pred-Pre: 78 %
FEV6-Post: 3.46 L
FEV6-Pre: 3.26 L
FEV6FVC-%Change-Post: 0 %
FEV6FVC-%PRED-PRE: 103 %
FEV6FVC-%Pred-Post: 104 %
FVC-%CHANGE-POST: 5 %
FVC-%Pred-Post: 79 %
FVC-%Pred-Pre: 75 %
FVC-Post: 3.49 L
FVC-Pre: 3.3 L
PRE FEV1/FVC RATIO: 73 %
PRE FEV6/FVC RATIO: 99 %
Post FEV1/FVC ratio: 77 %
Post FEV6/FVC ratio: 100 %
RV % pred: 83 %
RV: 1.82 L
TLC % pred: 78 %
TLC: 5.18 L

## 2014-06-17 MED ORDER — ALBUTEROL SULFATE (2.5 MG/3ML) 0.083% IN NEBU
2.5000 mg | INHALATION_SOLUTION | Freq: Once | RESPIRATORY_TRACT | Status: AC
  Administered 2014-06-17: 2.5 mg via RESPIRATORY_TRACT

## 2014-06-18 ENCOUNTER — Telehealth: Payer: Self-pay | Admitting: Internal Medicine

## 2014-06-18 NOTE — Telephone Encounter (Signed)
Will forward to MR as an FYI 

## 2014-06-25 ENCOUNTER — Telehealth: Payer: Self-pay | Admitting: Internal Medicine

## 2014-06-25 DIAGNOSIS — R0600 Dyspnea, unspecified: Secondary | ICD-10-CM

## 2014-06-25 NOTE — Telephone Encounter (Signed)
lmtcb for pt.  

## 2014-06-25 NOTE — Telephone Encounter (Signed)
Let Donald [name] know that autoimmune panel is normal and CT sinus but for very mild right maxillary sinus congestion is normal. So, basically do not hae cause for his shortness of breath and cough (pft mild restriction). Recommend he complete CPST bike test and then return for fu to see me  [facility] is now doing CPST but if delay send him to [facility] for it

## 2014-06-26 NOTE — Telephone Encounter (Signed)
Called and spoke to pt. Informed pt of the results and recs per MR. Order placed. Pt aware he will be contact to get CPST scheduled. Nothing further needed at this time.

## 2014-06-26 NOTE — Telephone Encounter (Signed)
Pt returned call [phone]

## 2014-07-03 ENCOUNTER — Emergency Department (HOSPITAL_COMMUNITY)
Admission: EM | Admit: 2014-07-03 | Discharge: 2014-07-03 | Disposition: A | Discharge status: Home / Self Care | Payer: Commercial Managed Care - PPO | Attending: Emergency Medicine | Admitting: Emergency Medicine

## 2014-07-03 ENCOUNTER — Telehealth: Payer: Self-pay | Admitting: Internal Medicine

## 2014-07-03 ENCOUNTER — Encounter (HOSPITAL_COMMUNITY): Payer: Self-pay

## 2014-07-03 ENCOUNTER — Emergency Department (HOSPITAL_COMMUNITY): Payer: Commercial Managed Care - PPO

## 2014-07-03 DIAGNOSIS — R0600 Dyspnea, unspecified: Secondary | ICD-10-CM

## 2014-07-03 DIAGNOSIS — Z87828 Personal history of other (healed) physical injury and trauma: Secondary | ICD-10-CM | POA: Diagnosis not present

## 2014-07-03 DIAGNOSIS — R011 Cardiac murmur, unspecified: Secondary | ICD-10-CM | POA: Diagnosis not present

## 2014-07-03 DIAGNOSIS — M170 Bilateral primary osteoarthritis of knee: Secondary | ICD-10-CM | POA: Diagnosis not present

## 2014-07-03 DIAGNOSIS — E119 Type 2 diabetes mellitus without complications: Secondary | ICD-10-CM | POA: Insufficient documentation

## 2014-07-03 DIAGNOSIS — I129 Hypertensive chronic kidney disease with stage 1 through stage 4 chronic kidney disease, or unspecified chronic kidney disease: Secondary | ICD-10-CM | POA: Diagnosis not present

## 2014-07-03 DIAGNOSIS — Z87448 Personal history of other diseases of urinary system: Secondary | ICD-10-CM | POA: Insufficient documentation

## 2014-07-03 DIAGNOSIS — Z9889 Other specified postprocedural states: Secondary | ICD-10-CM | POA: Diagnosis not present

## 2014-07-03 DIAGNOSIS — R0789 Other chest pain: Secondary | ICD-10-CM | POA: Diagnosis not present

## 2014-07-03 DIAGNOSIS — R079 Chest pain, unspecified: Secondary | ICD-10-CM | POA: Diagnosis present

## 2014-07-03 DIAGNOSIS — Z8701 Personal history of pneumonia (recurrent): Secondary | ICD-10-CM | POA: Diagnosis not present

## 2014-07-03 DIAGNOSIS — K219 Gastro-esophageal reflux disease without esophagitis: Secondary | ICD-10-CM | POA: Insufficient documentation

## 2014-07-03 DIAGNOSIS — J45901 Unspecified asthma with (acute) exacerbation: Secondary | ICD-10-CM | POA: Diagnosis not present

## 2014-07-03 DIAGNOSIS — G4733 Obstructive sleep apnea (adult) (pediatric): Secondary | ICD-10-CM | POA: Diagnosis not present

## 2014-07-03 DIAGNOSIS — Z7982 Long term (current) use of aspirin: Secondary | ICD-10-CM | POA: Diagnosis not present

## 2014-07-03 DIAGNOSIS — Z79899 Other long term (current) drug therapy: Secondary | ICD-10-CM | POA: Insufficient documentation

## 2014-07-03 DIAGNOSIS — N181 Chronic kidney disease, stage 1: Secondary | ICD-10-CM | POA: Diagnosis not present

## 2014-07-03 DIAGNOSIS — Z9981 Dependence on supplemental oxygen: Secondary | ICD-10-CM | POA: Diagnosis not present

## 2014-07-03 LAB — CBC
HEMATOCRIT: 41.2 % (ref 39.0–52.0)
HEMOGLOBIN: 13.8 g/dL (ref 13.0–17.0)
MCH: 28.7 pg (ref 26.0–34.0)
MCHC: 33.5 g/dL (ref 30.0–36.0)
MCV: 85.7 fL (ref 78.0–100.0)
Platelets: 259 10*3/uL (ref 150–400)
RBC: 4.81 MIL/uL (ref 4.22–5.81)
RDW: 14.5 % (ref 11.5–15.5)
WBC: 10.7 10*3/uL — AB (ref 4.0–10.5)

## 2014-07-03 LAB — BASIC METABOLIC PANEL
Anion gap: 8 (ref 5–15)
BUN: 19 mg/dL (ref 6–23)
CHLORIDE: 100 mmol/L (ref 96–112)
CO2: 28 mmol/L (ref 19–32)
Calcium: 9.1 mg/dL (ref 8.4–10.5)
Creatinine, Ser: 0.89 mg/dL (ref 0.50–1.35)
GFR, EST NON AFRICAN AMERICAN: 90 mL/min — AB (ref >90)
Glucose, Bld: 138 mg/dL — ABNORMAL HIGH (ref 70–99)
Potassium: 3.7 mmol/L (ref 3.5–5.1)
Sodium: 136 mmol/L (ref 135–145)

## 2014-07-03 LAB — I-STAT TROPONIN, ED
TROPONIN I, POC: 0.01 ng/mL (ref 0.00–0.08)
Troponin i, poc: 0.01 ng/mL (ref 0.00–0.08)

## 2014-07-03 MED ORDER — PREDNISONE 20 MG PO TABS
60.0000 mg | ORAL_TABLET | ORAL | Status: AC
  Administered 2014-07-03: 60 mg via ORAL
  Filled 2014-07-03: qty 3

## 2014-07-03 MED ORDER — PREDNISONE 20 MG PO TABS: 60.0000 mg | ORAL_TABLET | Freq: Every day | ORAL | Status: AC

## 2014-07-03 NOTE — ED Provider Notes (Signed)
CSN: [id number]     Arrival date & time 07/03/14  1354 History   First MD Initiated Contact with Patient 07/03/14 1357     Chief Complaint  Patient presents with  . Chest Pain     (Consider location/radiation/quality/duration/timing/severity/associated sxs/prior Treatment) HPI Patient presents with concern of new episodic chest pain. Patient always has some degree of dyspnea, and has had new dyspnea as well. Yesterday the patient had several episodes of upper posterior shoulder pain, occurring transiently, without clear precipitant. Today the patient had similar pain but across his anterior chest. Pain is diffuse, sharp, transient, without exertional or pleuritic factors. There was no concurrent lightheadedness, syncope, nausea, vomiting, fever, chills. Patient has a history of sleep apnea, bronchitis, uses noninvasive pressure support at home at night. Patient is scheduled for cardiac stress testing in 2 weeks due to ongoing dyspnea. Patient had evaluation at [facility] about one month ago including CT chest, CT maxillofacial, without acute findings.  Past Medical History  Diagnosis Date  . Diabetes mellitus   . Hypertension   . Hyperlipidemia   . Pneumonia   . Sinusitis   . Bronchitis   . Asthma   . Allergic rhinitis, seasonal   . Chronic kidney disease, stage I   . IgA nephropathy   . Degenerative joint disease of knee     bilateral  . GERD (gastroesophageal reflux disease)   . Heart murmur     history of  . History of hematuria   . History of acute prostatitis   . OSA on CPAP   . collar bone     dislocation left side 05/2014   Past Surgical History  Procedure Laterality Date  . Cervical spine surgery    . Hernia repair    . Deviated septum  1930s  . Tonsillectomy    . Cardiac catheterization  09/29/2011    normal  . Nm myoview ltd  07/08/2011    mild LV dilatation,mild septal hypokinesia  . US echocardiography  09/07/2011    EF 35-45%,mod.ant  hypokinesis,boderline LA & RA enlargement,trace MR,TR & PI  . Left heart catheterization with coronary angiogram N/A 09/29/2011    Procedure: LEFT HEART CATHETERIZATION WITH CORONARY ANGIOGRAM;  Surgeon: Sanda Klein, MD;  Location: [facility];  Service: Cardiovascular;  Laterality: N/A;   Family History  Problem Relation Age of Onset  . Coronary artery disease Father   . Diabetes type II Father   . Heart attack Father    History  Substance Use Topics  . Smoking status: Never Smoker   . Smokeless tobacco: Never Used     Comment: Second hand smoke for 26 years per pt  . Alcohol Use: No    Review of Systems  Constitutional:       Per HPI, otherwise negative  HENT:       Per HPI, otherwise negative  Respiratory:       Per HPI, otherwise negative  Cardiovascular:       Per HPI, otherwise negative  Gastrointestinal: Negative for vomiting.  Endocrine:       Negative aside from HPI  Genitourinary:       Neg aside from HPI   Musculoskeletal:       Per HPI, otherwise negative  Skin: Negative.   Neurological: Negative for syncope.      Allergies  Ciprofloxacin and Levofloxacin  Home Medications   Prior to Admission medications   Medication Sig Start Date End Date Taking? Authorizing Provider  acetaminophen (  TYLENOL) 500 MG tablet Take 500 mg by mouth every 6 (six) hours as needed for pain.    Historical Provider, MD  albuterol (PROVENTIL HFA;VENTOLIN HFA) 108 (90 BASE) MCG/ACT inhaler Inhale 2 puffs into the lungs every 6 (six) hours as needed. For shortness of breath    Historical Provider, MD  aspirin EC 81 MG tablet Take 81 mg by mouth daily.    Historical Provider, MD  budesonide-formoterol (SYMBICORT) 160-4.5 MCG/ACT inhaler Inhale 2 puffs into the lungs 2 (two) times daily as needed. For wheezing    Historical Provider, MD  buPROPion (WELLBUTRIN XL) 150 MG 24 hr tablet Take 150 mg by mouth daily.    Historical Provider, MD  diltiazem (CARDIZEM CD) 240 MG 24 hr  capsule Take 240 mg by mouth daily.    Historical Provider, MD  DULoxetine (CYMBALTA) 60 MG capsule Take 60 mg by mouth daily.    Historical Provider, MD  fluticasone (FLONASE) 50 MCG/ACT nasal spray Place 2 sprays into the nose daily as needed. Allergies    Historical Provider, MD  glimepiride (AMARYL) 1 MG tablet Take 1 mg by mouth daily before breakfast.    Historical Provider, MD  HYDROcodone-acetaminophen (NORCO/VICODIN) 5-325 MG per tablet  05/06/14   Historical Provider, MD  metFORMIN (GLUCOPHAGE) 1000 MG tablet Take 1,000 mg by mouth 2 (two) times daily with a meal.    Historical Provider, MD  nebivolol (BYSTOLIC) 10 MG tablet Take 10 mg by mouth every evening.    Historical Provider, MD  olmesartan-hydrochlorothiazide (BENICAR HCT) 40-12.5 MG per tablet Take 1 tablet by mouth every evening.    Historical Provider, MD  omeprazole (PRILOSEC) 20 MG capsule Take 20 mg by mouth daily.    Historical Provider, MD   BP 137/83 mmHg  Pulse 80  Temp(Src) 98.2 F (36.8 C) (Oral)  Resp 22  Ht 5\' 8"  (1.727 m)  Wt 175 lb (79.379 kg)  BMI 26.61 kg/m2  SpO2 89% Physical Exam  Constitutional: He is oriented to person, place, and time. He appears well-developed. No distress.  HENT:  Head: Normocephalic and atraumatic.  Eyes: Conjunctivae and EOM are normal.  Cardiovascular: Normal rate and regular rhythm.   Pulmonary/Chest: Effort normal. No stridor. No respiratory distress.  Abdominal: He exhibits no distension.  Musculoskeletal: He exhibits no edema.  Neurological: He is alert and oriented to person, place, and time.  Skin: Skin is warm and dry.  Psychiatric: He has a normal mood and affect.  Nursing note and vitals reviewed.   ED Course  Procedures (including critical care time) Labs Review Labs Reviewed  CBC - Abnormal; Notable for the following:    WBC 10.7 (*)    All other components within normal limits  BASIC METABOLIC PANEL - Abnormal; Notable for the following:    Glucose,  Bld 138 (*)    GFR calc non Af Amer 90 (*)    All other components within normal limits  I-STAT TROPOININ, ED  I-STAT TROPOININ, ED    Imaging Review No results found.   EKG Interpretation   Date/Time:  Friday July 03 2014 14:04:11 EST Ventricular Rate:  82 PR Interval:  135 QRS Duration: 93 QT Interval:  412 QTC Calculation: 481 R Axis:   2 Text Interpretation:  Sinus rhythm Borderline prolonged QT interval Sinus  rhythm Artifact No significant change since last tracing Abnormal ekg  Confirmed by Carmin Muskrat  MD [phone]) on 07/03/2014 3:01:11 PM     I reviewed the patient's chart, including  pulmonology visits, CT imaging from last month, without acute findings.   On repeat exam the patient is calm, awake and alert, in no distress.   I reviewed the x-ray, agree with the interpretation. MDM   Patient presents with episodic, transient chest pain.  Here, patient has unremarkable EKG, x-ray does not show pneumonia, labs are unremarkable. Patient had cardiac cath was in the past 2-1/2 years that was essentially normal. Pulmonary function testing from one month ago was abnormal, with decreased functionality. With no evidence for new acute processes, patient was discharged in stable condition to follow-up with his pulmonologist, primary care physician.     Carmin Muskrat, MD 07/03/14 1600

## 2014-07-03 NOTE — Discharge Instructions (Signed)
As discussed, your evaluation today has been largely reassuring.  But, it is important that you monitor your condition carefully, and do not hesitate to return to the ED if you develop new, or concerning changes in your condition.  Otherwise, please follow-up with your physicians for appropriate ongoing care.  In particular, please be sure to follow-up with your pulmonologist and cardiologist for planned evaluation.  Return here for concerning changes in your condition.

## 2014-07-03 NOTE — ED Notes (Signed)
Pt presents with onset of L sided chest pain that began while at work today.  Pt reports pain is to L side, does not radiate but reports was sharp this morning.  +shortness, denies nausea or diaphoresis

## 2014-07-03 NOTE — Telephone Encounter (Signed)
ROV has been scheduled for 07/06/14 with TP at 11:30am.

## 2014-07-05 ENCOUNTER — Telehealth: Payer: Self-pay | Admitting: Internal Medicine

## 2014-07-05 DIAGNOSIS — R0609 Other forms of dyspnea: Secondary | ICD-10-CM | POA: Insufficient documentation

## 2014-07-05 DIAGNOSIS — R0600 Dyspnea, unspecified: Secondary | ICD-10-CM | POA: Insufficient documentation

## 2014-07-05 NOTE — Telephone Encounter (Signed)
Tammy  I do not knwo what is going on her. Could just end up being irritiable larynx cough. HE also has dyspnea. [facility] set him for CPST but in interim ended in ER and he will see you 07/06/14 for post ER fu  rec - rsi cough score part 1 - on spot spirometry to compared my office spiro => then PFT -> your office spiro - maybe also consider methacoline challenge test    Thanks  Dr. Brand Males, M.D., [facility].C.P Pulmonary and Critical Care Medicine Staff Physician [place] [facility] Critical [facility] Pager: [phone], If no answer or between  15:00h - 7:00h: call [phone]  07/05/2014 7:28 PM

## 2014-07-06 ENCOUNTER — Ambulatory Visit (INDEPENDENT_AMBULATORY_CARE_PROVIDER_SITE_OTHER): Payer: Commercial Managed Care - PPO | Admitting: Adult Health

## 2014-07-06 ENCOUNTER — Encounter: Payer: Self-pay | Admitting: Adult Health

## 2014-07-06 VITALS — BP 108/66 | HR 81 | Temp 98.3°F | Ht 68.0 in | Wt 183.4 lb

## 2014-07-06 DIAGNOSIS — R05 Cough: Secondary | ICD-10-CM | POA: Diagnosis not present

## 2014-07-06 DIAGNOSIS — R053 Chronic cough: Secondary | ICD-10-CM

## 2014-07-06 NOTE — Assessment & Plan Note (Signed)
Cyclical upper airway cough Extensive workup has been unrevealing. PFT show minimal restriction and mid diffusing defect , mid flow reactivity ? Asthma component  Would cont on symbicort for now  If cough is not improving on return can consider MCT  For now control cough symptoms and tx as UAC  Neg autoimmune  Neg CT sinus  Follow CPST   Plan  Delsym 2 tsp Twice daily  for cough May use Hydromet cough syrup as needed for cough control, may make you sleepy Begin Chlor-Trimeton 4 mg 2 tablets at bedtime Begin Allegra 180 mg daily in the morning Continue on Prilosec daily Begin Pepcid 20 mg at bedtime NO MINTS GERD diet  Use sugarless candy and sips of water to avoid throat clearing and cough Continue on Symbicort 2 puffs twice daily, rinse and brush well after use Follow-up for cardiopulmonary stress test Follow with Dr. Chase Caller in 6 weeks and as needed

## 2014-07-06 NOTE — Patient Instructions (Addendum)
Delsym 2 tsp Twice daily  for cough May use Hydromet cough syrup as needed for cough control, may make you sleepy Begin Chlor-Trimeton 4 mg 2 tablets at bedtime Begin Allegra 180 mg daily in the morning Continue on Prilosec daily Begin Pepcid 20 mg at bedtime NO MINTS GERD diet  Use sugarless candy and sips of water to avoid throat clearing and cough Continue on Symbicort 2 puffs twice daily, rinse and brush well after use Follow-up for cardiopulmonary stress test Follow with Dr. Chase Caller in 6 weeks and as needed

## 2014-07-06 NOTE — Progress Notes (Signed)
Subjective:    Patient ID: Donald [name], male    DOB: 02-26-1951, 64 y.o.   MRN: [id number] PCP [name], MD  HPI  IOV 06/11/2014  Chief Complaint  Patient presents with  . Pulmonary Consult    Pt referred by [name] for recurrent bronchitis. Pt stated he has been having s/s intermittenly for 3 months.    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 Accompanied by his wife. Referred by [name] for recurrent bronchitis and chronic cough. Somewhat of a poor historian and at best as I can gather he has a history of long-standing asthma for many years but he self describes this as mild intermittent and only uses Symbicort as needed. On this background baseline he gets episodic cough and chest congestion with chest tightness and wheezing perhaps 1 time a year which necessitates prednisone and antibiotics with good relief. For the year 2015 he thinks that he said prednisone course 1 time. His main episode in 2015 was at first in August 2015 during which time he had cough, gray colored sputum, sinus congestion and fatigue for one week without any fever or aches. He was treated with Z-Pak. Patient himself does not remember this episode. Per the referring physician charts chest x-ray  was normal.   The main issue appears to be that since December 2015 after an episode of sinus congestion and chest tightness he continues to have chronic cough and chest tightness.. This is described as mild at baseline but when he gets spasms it is severe. It is mostly dry but occasionally he does bring up white colored sputum. He was treated with prednisone in the early part of December 2015 according to his history for 1 week but the cough has lingered and that his main concern. Overall though he does say that his cough has improved in severity. Symptoms are associated with a feeling of something stuck in his throat which he attributes to a recent diagnosis of dislocated left sternoclavicular joint. He did  see his primary care physician again on 05/25/2014 and it is noted that he did have continued cough and low-grade fevers at this time associated with some whitish sputum. The concern at this time was that he was possibly getting hypoxemic when he would sit down and he would desaturate or low 90s or high 80s. Repeat chest x-ray again the stem was normal according to the review of chart. He was reporting that over the counter cough medications are not helping him as much. He was treated with a prednisone taper which he says that he did not get much help  This then prompted a CT scan of the chest in 01/04/2015 that essentially was clear except for "slight scarring" in the lung base  His spirometry today in our pulmonary office done 06/11/2014 shows an FEV1 of 2.25 L/65%, FVC of 2.04 L/69% with a ratio of 74/94% - suggestive of restrictin. However,  pulmonary function test 08/25/2011 was normal   Walking desta test - 185 feet x 3 laps: No desats (at hom ehe has nocturnal and siting desats to 70s but none during exertion per him)   has a past medical history of Diabetes mellitus; Hypertension; Hyperlipidemia; Pneumonia; Sinusitis; Bronchitis; Asthma; Allergic rhinitis, seasonal; Chronic kidney disease, stage I; IgA nephropathy; Degenerative joint disease of knee; GERD (gastroesophageal reflux disease); Heart murmur; History of hematuria; History of acute prostatitis; OSA on CPAP; and collar bone. >> PFTs and lab work   07/06/2014 Follow up : Chronic cough Patient returns  for a three-week follow-up. Patient was seen last visit for initial pulmonary consultation for chronic cough began in December 2015. Patient was sent for pulmonary function test on 06/17/14  that showed an FEV1(post BD) at 81%, ratio 77, FVC 79%, 11% change post bronchodilator, mid flows decreased, with positive reversibility, mild decreased diffusing capacity is 71%. Previous CT scan in August 2015 was clear except for scarring in the  bases.. No oxygen desaturations with ambulation at last office visit Lab work for autoimmune panel was negative. CT sinus was negative for sinus disease Patient was seen in the emergency room last week for shortness of breath and chest pain, cardiac enzymes were negative. Chest x-ray was negative. Patient remains on Symbicort twice daily. Patient complains of a cough that is minimally productive that keeps him up  And interrupts his quality-of-life. Has an upcoming cardiopulmonary stress test. Does complain of drainage in the back of his throat and frequent throat clearing. Denies any hemoptysis, chest pain, orthopnea, PND or leg swelling. She says did have a cardiology evaluation a couple years ago with stress test, echo and cardiac catheterization that he reports as negative.      Review of Systems  Constitutional:   No  weight loss, night sweats,  Fevers, chills,  +fatigue, or  lassitude.  HEENT:   No headaches,  Difficulty swallowing,  Tooth/dental problems, or  Sore throat,                No sneezing, itching, ear ache,  +nasal congestion, post nasal drip,   CV:  No chest pain,  Orthopnea, PND, swelling in lower extremities, anasarca, dizziness, palpitations, syncope.   GI  No heartburn, indigestion, abdominal pain, nausea, vomiting, diarrhea, change in bowel habits, loss of appetite, bloody stools.   Resp:  No chest wall deformity  Skin: no rash or lesions.  GU: no dysuria, change in color of urine, no urgency or frequency.  No flank pain, no hematuria   MS:  No joint pain or swelling.  No decreased range of motion.  No back pain.  Psych:  No change in mood or affect. No depression or anxiety.  No memory loss.          Objective:   Physical Exam j GEN: A/Ox3; pleasant , NAD  HEENT:  [place]/AT,  EACs-clear, TMs-wnl, NOSE-clear, THROAT-clear, no lesions, no postnasal drip or exudate noted.   NECK:  Supple w/ fair ROM; no JVD; normal carotid impulses w/o bruits; no  thyromegaly or nodules palpated; no lymphadenopathy.  RESP  Clear  P & A; w/o, wheezes/ rales/ or rhonchi.no accessory muscle use, no dullness to percussion  CARD:  RRR, no m/r/g  , no peripheral edema, pulses intact, no cyanosis or clubbing.  GI:   Soft & nt; nml bowel sounds; no organomegaly or masses detected.  Musco: Warm bil, no deformities or joint swelling noted.   Neuro: alert, no focal deficits noted.    Skin: Warm, no lesions or rashes

## 2014-07-21 ENCOUNTER — Ambulatory Visit (HOSPITAL_COMMUNITY): Payer: Commercial Managed Care - PPO | Attending: Internal Medicine

## 2014-07-21 DIAGNOSIS — R0600 Dyspnea, unspecified: Secondary | ICD-10-CM | POA: Diagnosis present

## 2014-08-13 ENCOUNTER — Telehealth: Payer: Self-pay | Admitting: Internal Medicine

## 2014-08-13 NOTE — Telephone Encounter (Signed)
Called spoke with pt. He is requesting his lab work and CT sinus results faxed to PCP. I have done so. Nothing further needed

## 2014-08-15 ENCOUNTER — Telehealth: Payer: Self-pay | Admitting: Internal Medicine

## 2014-08-15 NOTE — Telephone Encounter (Signed)
Donald [name]  Let Ryszard Socarras know that CPST shows that his chest cage is restricting his breathing. Just like his PFTs showed resstrictive abnormality. Not sure why because lungs looked ok on CT chest. It is a complicated conversation and prefer he makes face to face appt but I See he lost a job. So please ask him if he wants a phone call from me ?  Thanks Dr. Brand Males, M.D., [facility].C.P Pulmonary and Critical Care Medicine Staff Physician [place] [facility]monary and Critical [facility] Pager: [phone], If no answer or between  15:00h - 7:00h: call [phone]  08/15/2014 3:28 PM

## 2014-08-17 ENCOUNTER — Ambulatory Visit: Payer: Commercial Managed Care - PPO | Admitting: Internal Medicine

## 2014-08-17 NOTE — Telephone Encounter (Signed)
Patient notified.  Patient says he would prefer a telephone call from MR since he cannot afford to come in right now.  He said he can be reached at [phone] all day today.    To MR for follow up

## 2014-12-08 ENCOUNTER — Ambulatory Visit: Payer: 59 | Admitting: Nurse Practitioner

## 2015-07-07 DIAGNOSIS — Z0000 Encounter for general adult medical examination without abnormal findings: Secondary | ICD-10-CM | POA: Insufficient documentation

## 2015-07-19 DIAGNOSIS — F39 Unspecified mood [affective] disorder: Secondary | ICD-10-CM | POA: Insufficient documentation

## 2016-02-08 IMAGING — CT CT CHEST W/ CM
3 series · 17 of 29 positions shown, 19 images · IV contrast (omnipaque)
Comparison: Chest radiograph July 06, 2011

CLINICAL DATA: Left-sided chest pain. Chronic asthmatic bronchitis.
Chronic cough.

EXAM:
CT CHEST WITH CONTRAST
TECHNIQUE: Multidetector CT imaging of the chest was performed during
intravenous contrast administration.
CONTRAST:  75mL OMNIPAQUE IOHEXOL 300 MG/ML  SOLN

[Series 2: chest with · axial · 0.70mm/px · z∈[-212,-32]mm · 5 of 62 slices shown, 7 images]
[im 13/62  mediastinal]
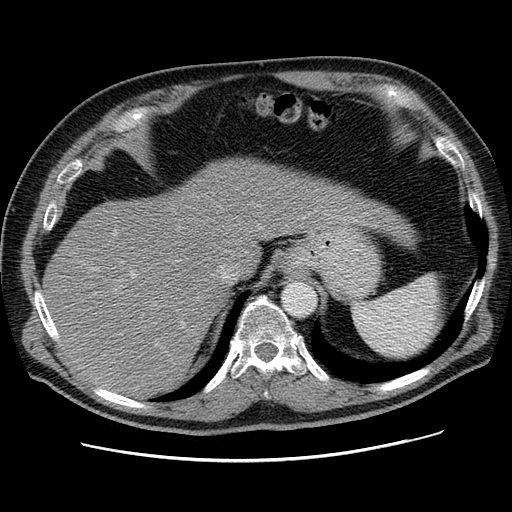
[im 13/62  lung]
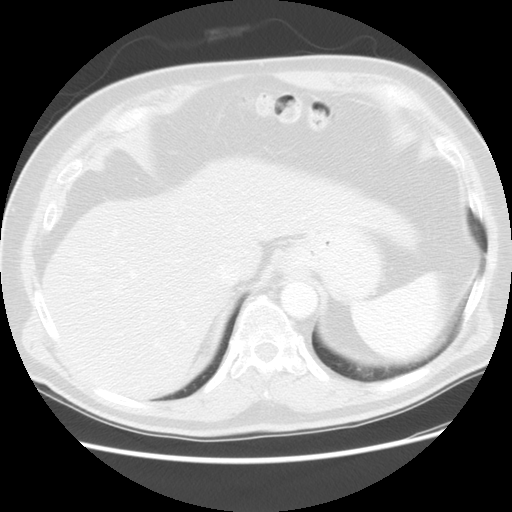
[im 25/62  lung]
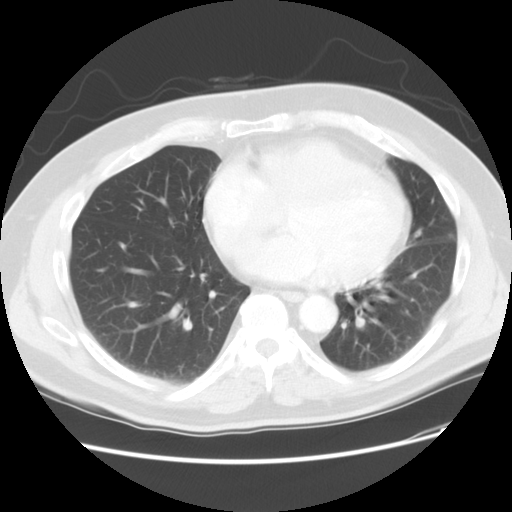
[im 33/62  lung]
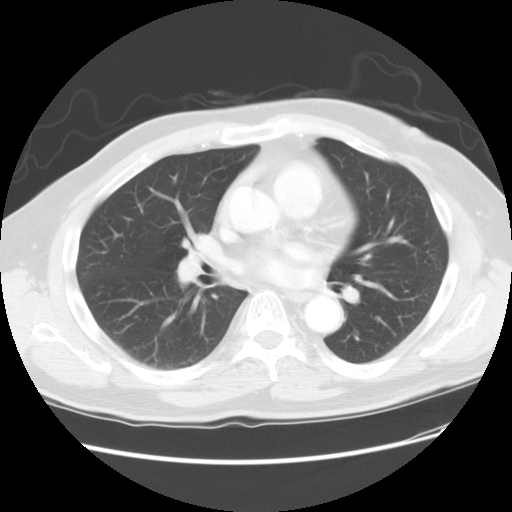
[im 37/62  lung]
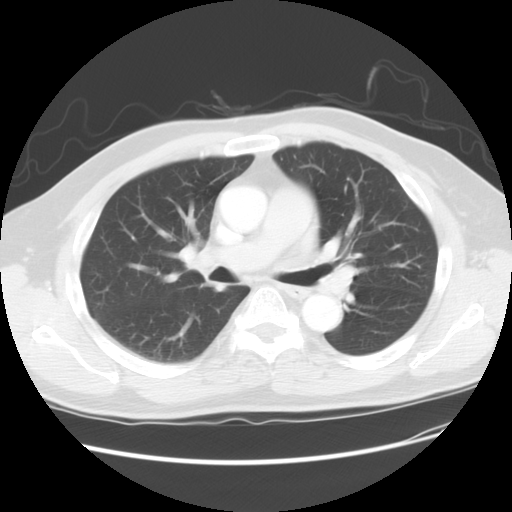
[im 49/62  mediastinal]
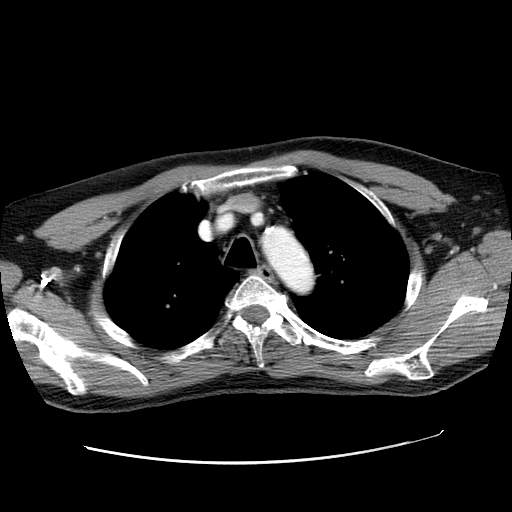
[im 49/62  lung]
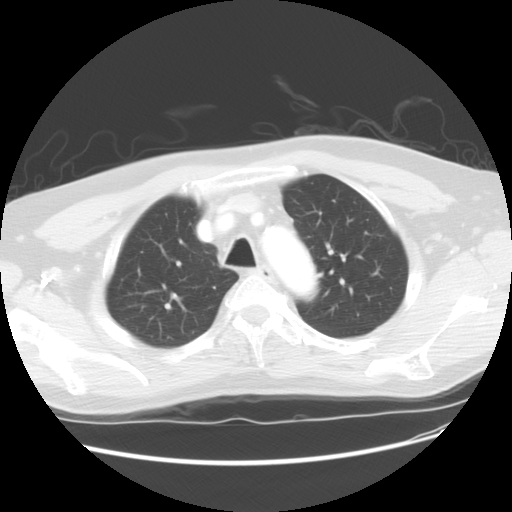

[Series 400: sag · sagittal · 0.70mm/px · 8 of 143 slices shown]
[im 12/143  lung]
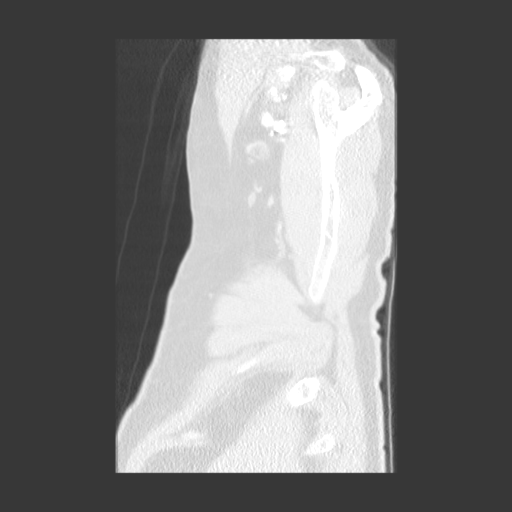
[im 36/143  lung]
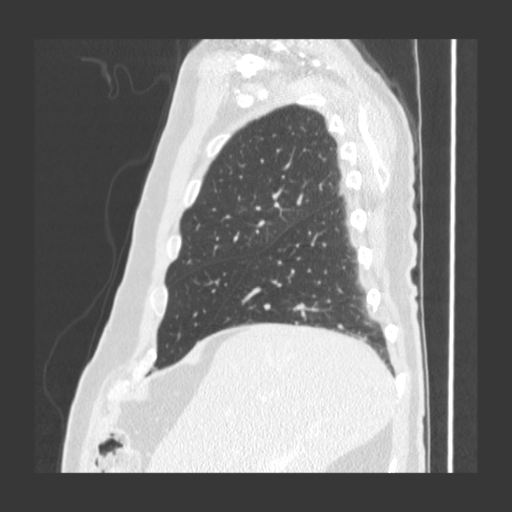
[im 48/143  lung]
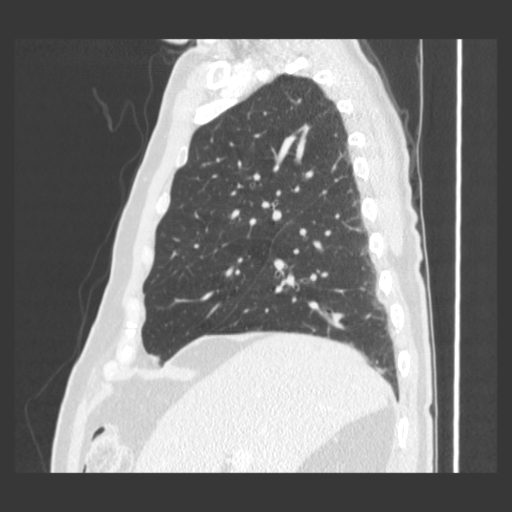
[im 60/143  lung]
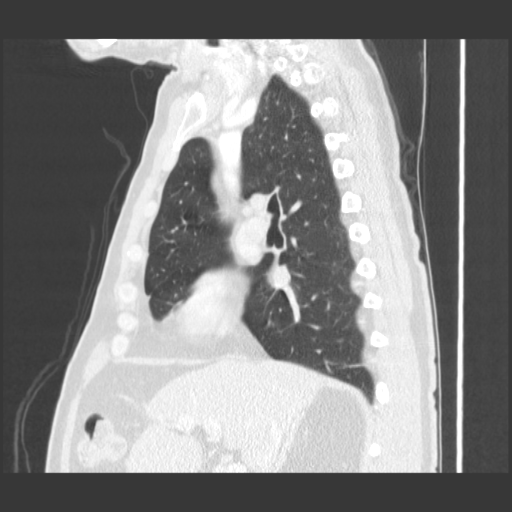
[im 83/143  lung]
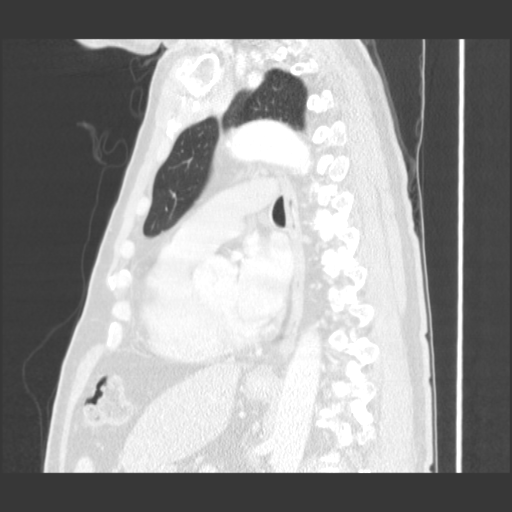
[im 95/143  lung]
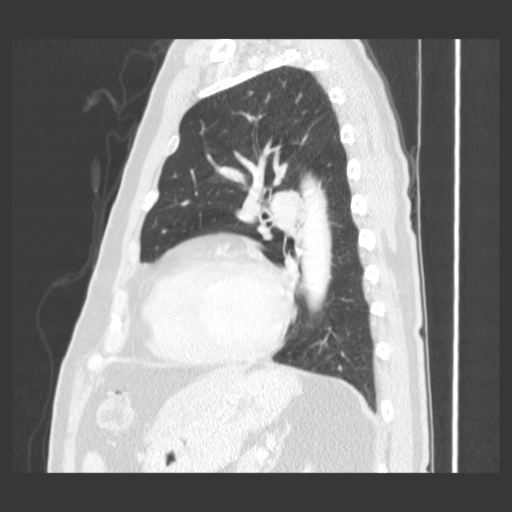
[im 107/143  lung]
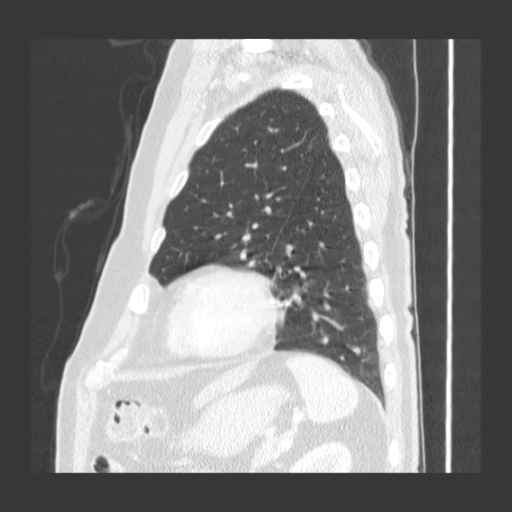
[im 131/143  lung]
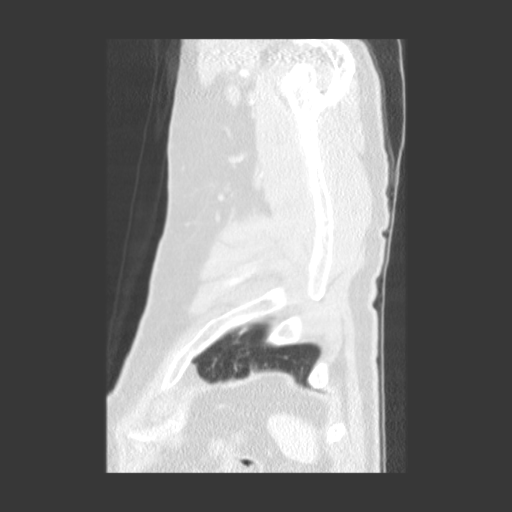

[Series 401: cor · coronal · 0.70mm/px · 4 of 95 slices shown]
[im 12/95  lung]
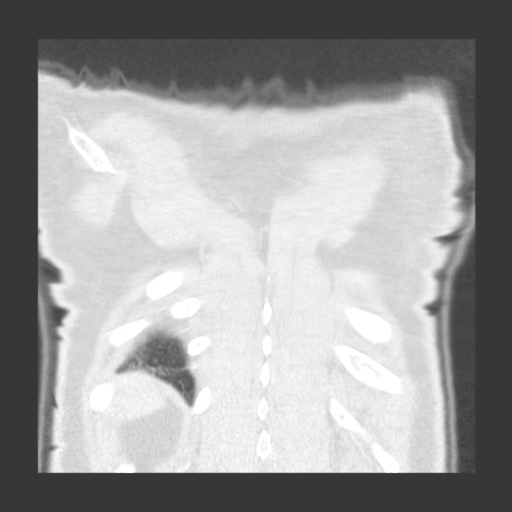
[im 24/95  lung]
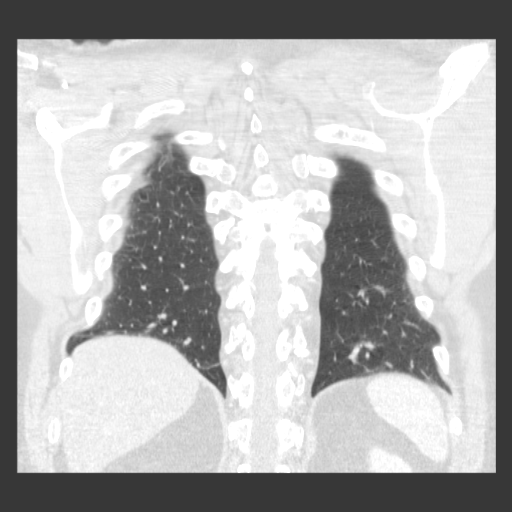
[im 36/95  lung]
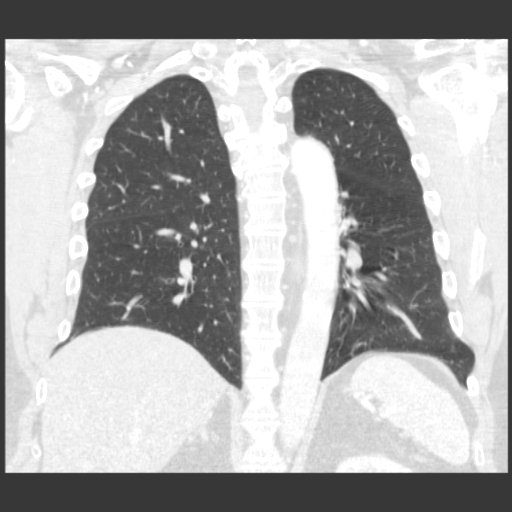
[im 48/95  lung]
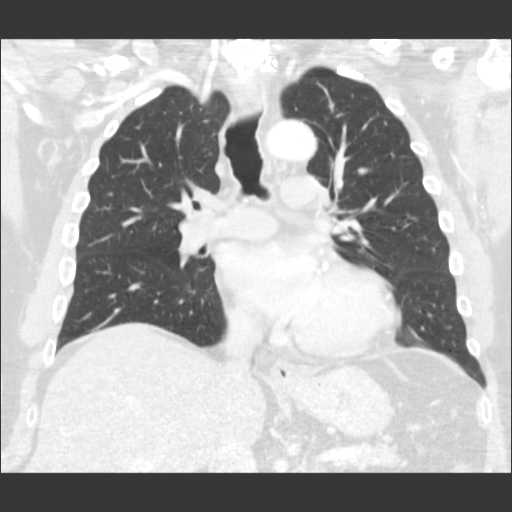

[17 of 29 positions shown; findings below may reference images not displayed]

FINDINGS: There is mild bibasilar lung scarring, slightly more on the left
than on the right. There is no edema or consolidation. There is no
appreciable thoracic adenopathy. There is atherosclerotic change in
the aorta but no aneurysm or dissection. No demonstrable pulmonary
embolus is evident on this study which was not timed to optimize
assessment for pulmonary embolus.

There are foci of coronary artery calcification. Pericardium is not
thickened. Heart is slightly enlarged.

In the visualized upper abdomen, there is hepatic steatosis. There
are a few subcentimeter areas of decreased attenuation, most likely
representing either small cysts or small hamartomas.

There are no blastic or lytic bone lesions. There is postoperative
change in the lower cervical spine. Visualized thyroid is
unremarkable.
IMPRESSION: No lung edema or consolidation. Slight scarring in the bases. No
adenopathy. Hepatic steatosis. Areas of atherosclerotic change.

## 2016-03-07 IMAGING — DX DG CHEST 2V
2 series · 2 of 2 positions shown · non-contrast
Comparison: Chest radiograph 07/06/2011, CT 06/05/2004

CLINICAL DATA: Chest pain

EXAM:
CHEST  2 VIEW

[chest pa]
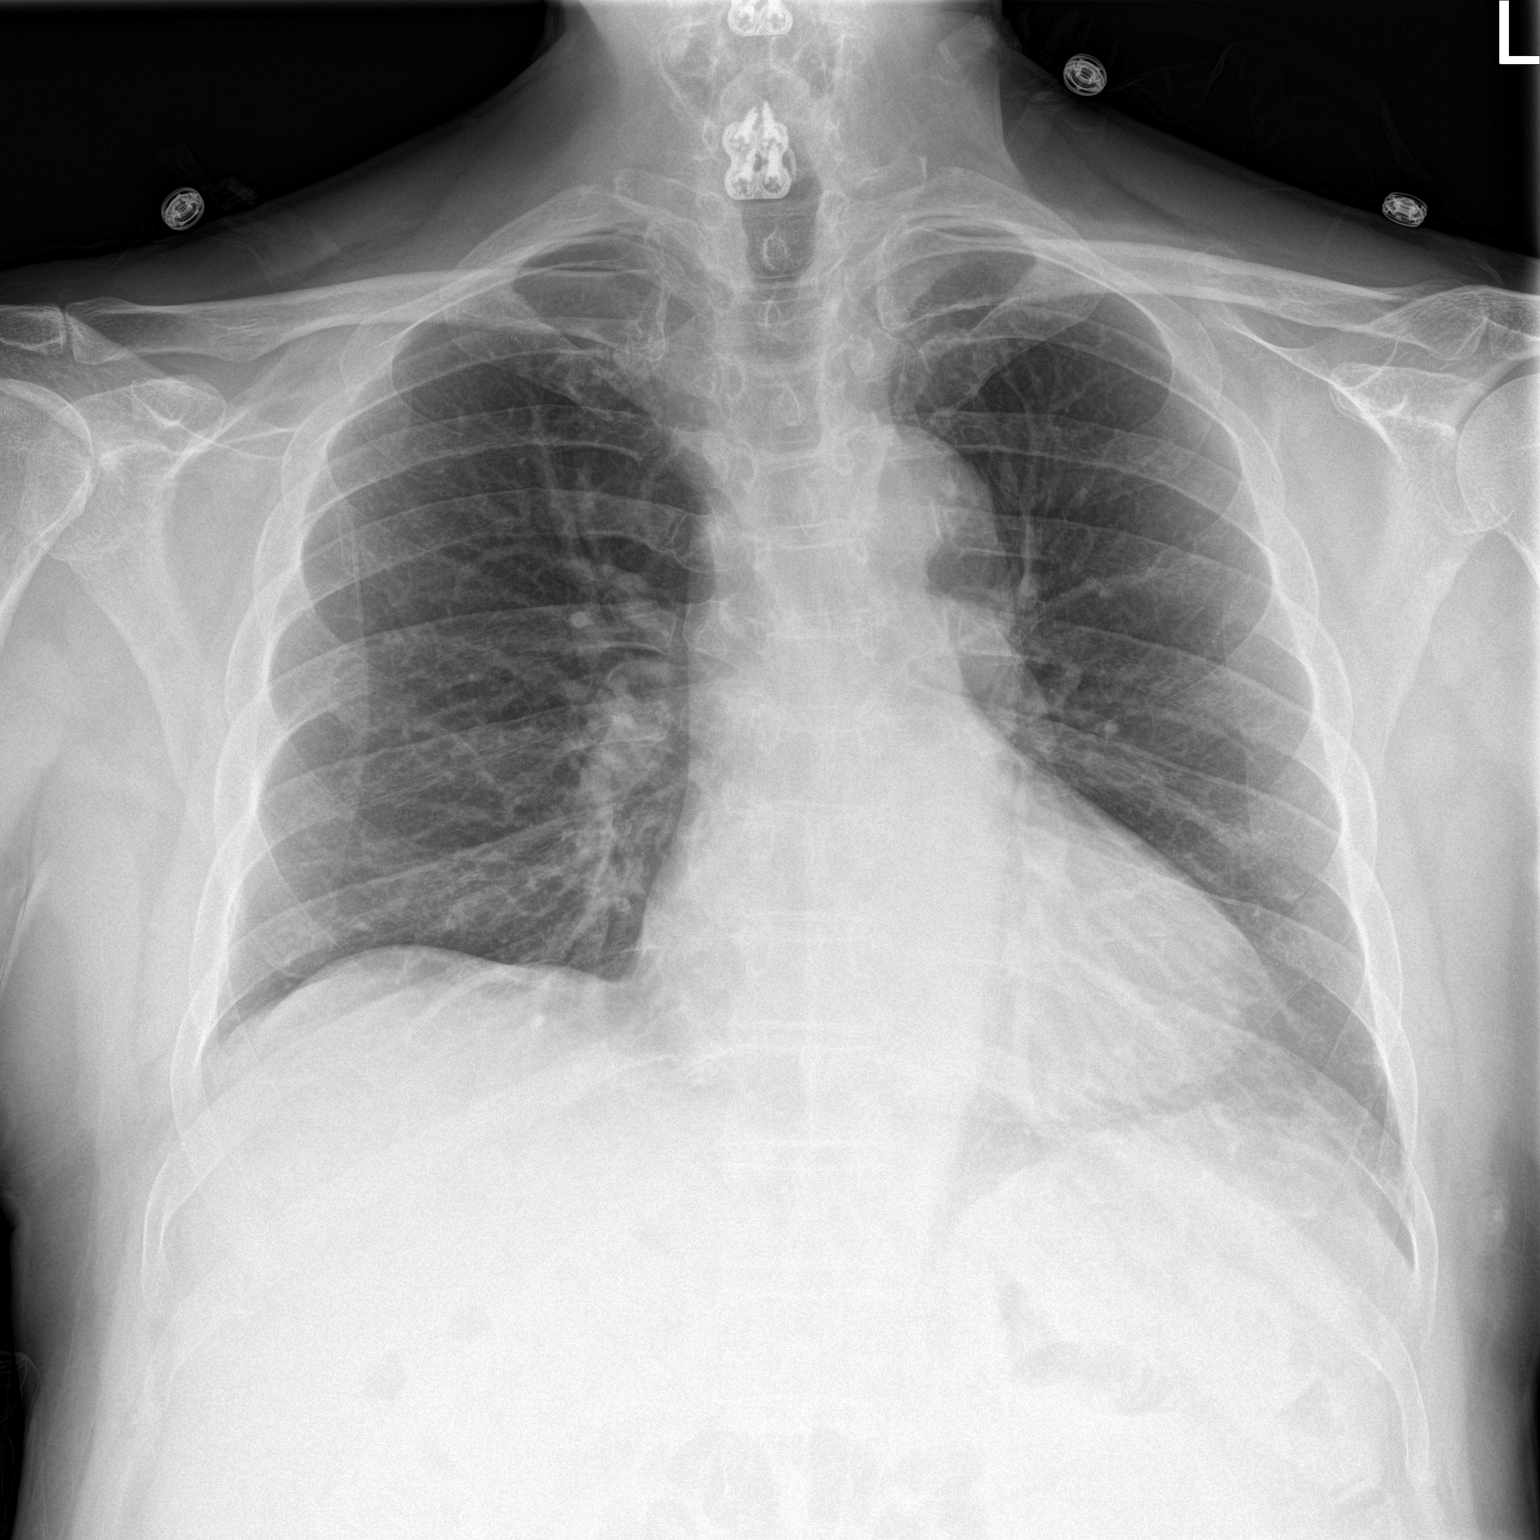

[chest lat]
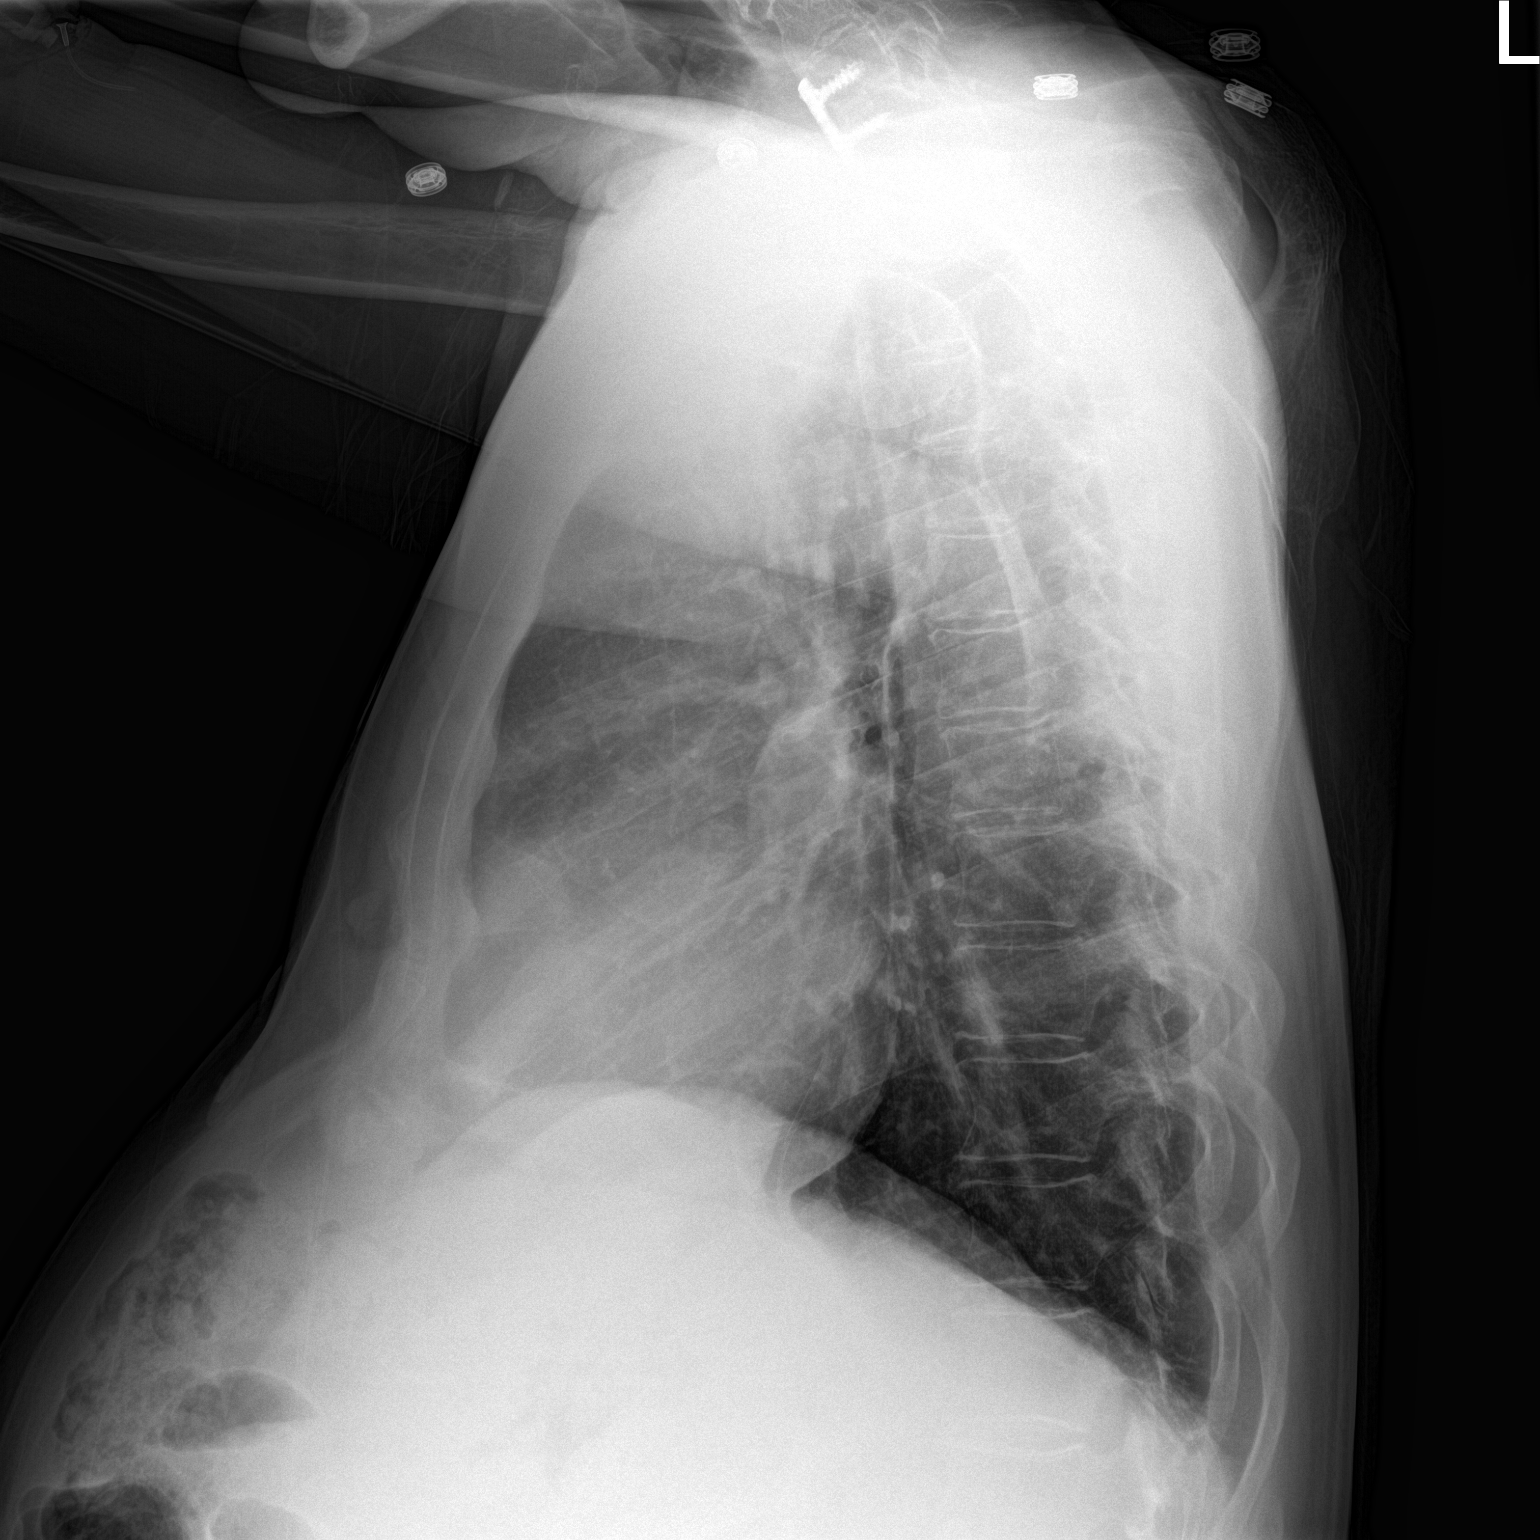

[2 of 2 positions shown; findings below may reference images not displayed]

FINDINGS: Normal cardiac silhouette. Normal pulmonary vasculature. No
effusion, infiltrate, or pneumothorax. Anterior cervical fusion. No
acute osseous abnormality.
IMPRESSION: No acute cardiopulmonary process.

## 2016-03-14 DIAGNOSIS — G894 Chronic pain syndrome: Secondary | ICD-10-CM | POA: Insufficient documentation

## 2017-05-23 DIAGNOSIS — I10 Essential (primary) hypertension: Secondary | ICD-10-CM | POA: Diagnosis not present

## 2017-05-23 DIAGNOSIS — Z6828 Body mass index (BMI) 28.0-28.9, adult: Secondary | ICD-10-CM | POA: Diagnosis not present

## 2017-05-23 DIAGNOSIS — N181 Chronic kidney disease, stage 1: Secondary | ICD-10-CM | POA: Diagnosis not present

## 2017-05-23 DIAGNOSIS — E118 Type 2 diabetes mellitus with unspecified complications: Secondary | ICD-10-CM | POA: Diagnosis not present

## 2017-06-08 DIAGNOSIS — R509 Fever, unspecified: Secondary | ICD-10-CM | POA: Diagnosis not present

## 2017-06-08 DIAGNOSIS — R51 Headache: Secondary | ICD-10-CM | POA: Diagnosis not present

## 2017-06-11 ENCOUNTER — Other Ambulatory Visit: Payer: Self-pay

## 2017-06-11 ENCOUNTER — Emergency Department (HOSPITAL_BASED_OUTPATIENT_CLINIC_OR_DEPARTMENT_OTHER): Payer: Medicare HMO

## 2017-06-11 ENCOUNTER — Encounter (HOSPITAL_BASED_OUTPATIENT_CLINIC_OR_DEPARTMENT_OTHER): Payer: Self-pay | Admitting: *Deleted

## 2017-06-11 ENCOUNTER — Emergency Department (HOSPITAL_BASED_OUTPATIENT_CLINIC_OR_DEPARTMENT_OTHER)
Admission: EM | Admit: 2017-06-11 | Discharge: 2017-06-11 | Disposition: A | Discharge status: Home / Self Care | Payer: Medicare HMO | Attending: Emergency Medicine | Admitting: Emergency Medicine

## 2017-06-11 DIAGNOSIS — Z79899 Other long term (current) drug therapy: Secondary | ICD-10-CM | POA: Diagnosis not present

## 2017-06-11 DIAGNOSIS — E876 Hypokalemia: Secondary | ICD-10-CM | POA: Diagnosis not present

## 2017-06-11 DIAGNOSIS — N181 Chronic kidney disease, stage 1: Secondary | ICD-10-CM | POA: Insufficient documentation

## 2017-06-11 DIAGNOSIS — R509 Fever, unspecified: Secondary | ICD-10-CM | POA: Diagnosis present

## 2017-06-11 DIAGNOSIS — J111 Influenza due to unidentified influenza virus with other respiratory manifestations: Secondary | ICD-10-CM

## 2017-06-11 DIAGNOSIS — I129 Hypertensive chronic kidney disease with stage 1 through stage 4 chronic kidney disease, or unspecified chronic kidney disease: Secondary | ICD-10-CM | POA: Diagnosis not present

## 2017-06-11 DIAGNOSIS — J45909 Unspecified asthma, uncomplicated: Secondary | ICD-10-CM | POA: Diagnosis not present

## 2017-06-11 DIAGNOSIS — Z7982 Long term (current) use of aspirin: Secondary | ICD-10-CM | POA: Insufficient documentation

## 2017-06-11 DIAGNOSIS — R079 Chest pain, unspecified: Secondary | ICD-10-CM | POA: Diagnosis not present

## 2017-06-11 DIAGNOSIS — Z7984 Long term (current) use of oral hypoglycemic drugs: Secondary | ICD-10-CM | POA: Diagnosis not present

## 2017-06-11 DIAGNOSIS — R05 Cough: Secondary | ICD-10-CM | POA: Diagnosis not present

## 2017-06-11 DIAGNOSIS — R69 Illness, unspecified: Secondary | ICD-10-CM

## 2017-06-11 DIAGNOSIS — E1122 Type 2 diabetes mellitus with diabetic chronic kidney disease: Secondary | ICD-10-CM | POA: Insufficient documentation

## 2017-06-11 LAB — I-STAT CHEM 8, ED
BUN: 35 mg/dL — ABNORMAL HIGH (ref 6–20)
CALCIUM ION: 1.05 mmol/L — AB (ref 1.15–1.40)
CREATININE: 1.2 mg/dL (ref 0.61–1.24)
Chloride: 97 mmol/L — ABNORMAL LOW (ref 101–111)
Glucose, Bld: 230 mg/dL — ABNORMAL HIGH (ref 65–99)
HCT: 46 % (ref 39.0–52.0)
Hemoglobin: 15.6 g/dL (ref 13.0–17.0)
Potassium: 2.9 mmol/L — ABNORMAL LOW (ref 3.5–5.1)
Sodium: 138 mmol/L (ref 135–145)
TCO2: 29 mmol/L (ref 22–32)

## 2017-06-11 LAB — URINALYSIS, ROUTINE W REFLEX MICROSCOPIC
BILIRUBIN URINE: NEGATIVE
Glucose, UA: >=500 mg/dL — AB
Ketones, ur: 15 mg/dL — AB
Leukocytes, UA: NEGATIVE
Nitrite: NEGATIVE
PH: 6 (ref 5.0–8.0)
Protein, ur: NEGATIVE mg/dL

## 2017-06-11 LAB — CBC WITH DIFFERENTIAL/PLATELET
BASOS PCT: 0 %
Basophils Absolute: 0 10*3/uL (ref 0.0–0.1)
EOS PCT: 2 %
Eosinophils Absolute: 0.2 10*3/uL (ref 0.0–0.7)
HEMATOCRIT: 45.8 % (ref 39.0–52.0)
Hemoglobin: 15.5 g/dL (ref 13.0–17.0)
Lymphocytes Relative: 16 %
Lymphs Abs: 1.3 10*3/uL (ref 0.7–4.0)
MCH: 28.8 pg (ref 26.0–34.0)
MCHC: 33.8 g/dL (ref 30.0–36.0)
MCV: 85.1 fL (ref 78.0–100.0)
MONO ABS: 0.8 10*3/uL (ref 0.1–1.0)
MONOS PCT: 10 %
Neutro Abs: 5.8 10*3/uL (ref 1.7–7.7)
Neutrophils Relative %: 72 %
PLATELETS: 241 10*3/uL (ref 150–400)
RBC: 5.38 MIL/uL (ref 4.22–5.81)
RDW: 15.1 % (ref 11.5–15.5)
WBC: 8.1 10*3/uL (ref 4.0–10.5)

## 2017-06-11 LAB — URINALYSIS, MICROSCOPIC (REFLEX)

## 2017-06-11 LAB — TROPONIN I: Troponin I: <0.03 ng/mL (ref <0.03)

## 2017-06-11 MED ORDER — SODIUM CHLORIDE 0.9 % IV BOLUS (SEPSIS)
1000.0000 mL | Freq: Once | INTRAVENOUS | Status: AC
  Administered 2017-06-11: 1000 mL via INTRAVENOUS

## 2017-06-11 MED ORDER — DM-GUAIFENESIN ER 30-600 MG PO TB12: 1.0000 | ORAL_TABLET | Freq: Two times a day (BID) | ORAL | 1 refills | Status: DC

## 2017-06-11 MED ORDER — POTASSIUM CHLORIDE CRYS ER 20 MEQ PO TBCR
40.0000 meq | EXTENDED_RELEASE_TABLET | Freq: Once | ORAL | Status: AC
  Administered 2017-06-11: 40 meq via ORAL
  Filled 2017-06-11: qty 2

## 2017-06-11 MED ORDER — ALBUTEROL SULFATE HFA 108 (90 BASE) MCG/ACT IN AERS
2.0000 | INHALATION_SPRAY | Freq: Four times a day (QID) | RESPIRATORY_TRACT | Status: DC
  Administered 2017-06-11: 2 via RESPIRATORY_TRACT
  Filled 2017-06-11: qty 6.7

## 2017-06-11 MED ORDER — SODIUM CHLORIDE 0.9 % IV SOLN
INTRAVENOUS | Status: DC
  Administered 2017-06-11 (×2): via INTRAVENOUS

## 2017-06-11 MED ORDER — IPRATROPIUM-ALBUTEROL 0.5-2.5 (3) MG/3ML IN SOLN
3.0000 mL | Freq: Four times a day (QID) | RESPIRATORY_TRACT | Status: DC
  Administered 2017-06-11: 3 mL via RESPIRATORY_TRACT

## 2017-06-11 MED ORDER — POTASSIUM CHLORIDE 10 MEQ/100ML IV SOLN
10.0000 meq | Freq: Once | INTRAVENOUS | Status: AC
  Administered 2017-06-11: 10 meq via INTRAVENOUS
  Filled 2017-06-11: qty 100

## 2017-06-11 MED ORDER — POTASSIUM CHLORIDE ER 10 MEQ PO TBCR: 10.0000 meq | EXTENDED_RELEASE_TABLET | Freq: Two times a day (BID) | ORAL | 0 refills | Status: DC

## 2017-06-11 NOTE — ED Notes (Signed)
Pt verbalizes understanding of d/c instructions and denies any further needs at this time. 

## 2017-06-11 NOTE — ED Triage Notes (Signed)
Pt c/o URi symptoms x 3 days ago , seen 3 days ago at PMD , flu neg

## 2017-06-11 NOTE — ED Provider Notes (Addendum)
[facility] EMERGENCY DEPARTMENT Provider Note   CSN: [id number] Arrival date & time: 06/11/17  1342     History   Chief Complaint Chief Complaint  Patient presents with  . URI    HPI Donald [name] is a 67 y.o. male.  Patient with 4-day history of fever headache cough nausea and body aches.  Seen by primary care provider on Friday flu test at that time was negative.  Patient did have the flu vaccine this year.  Patient symptoms sound very flulike in nature.  Also yesterday had intermittent chest pain on and off over 30 minutes.  No chest pain today.  Patient has had trouble with reactive airway disease in the past.  Patient is known to have some low oxygen saturations extensive workup in the past without any significant findings.  No longer using oxygen at home.      Past Medical History:  Diagnosis Date  . Allergic rhinitis, seasonal   . Asthma   . Bronchitis   . Chronic kidney disease, stage I   . collar bone    dislocation left side 05/2014  . Degenerative joint disease of knee    bilateral  . Diabetes mellitus   . GERD (gastroesophageal reflux disease)   . Heart murmur    history of  . History of acute prostatitis   . History of hematuria   . Hyperlipidemia   . Hypertension   . IgA nephropathy   . OSA on CPAP   . Pneumonia   . Sinusitis     Patient Active Problem List   Diagnosis Date Noted  . Dyspnea 07/05/2014  . Chronic cough 06/11/2014  . Chronic sinusitis with recurrent bronchitis 06/11/2014  . Hypoxemia requiring supplemental oxygen 06/04/2014  . OSA on CPAP 06/04/2014  . Pneumonitis 06/04/2014  . Hypoxemia 06/13/2013  . Pneumonia, interstitial ([place]) 06/13/2013  . OSA (obstructive sleep apnea) 11/14/2011  . Nocturnal hypoxemia 08/18/2011  . SOB (shortness of breath) 07/06/2011  . 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    Past Surgical History:  Procedure Laterality Date  . CARDIAC CATHETERIZATION  09/29/2011   normal  .  CERVICAL SPINE SURGERY    . deviated septum  1930s  . HERNIA REPAIR    . LEFT HEART CATHETERIZATION WITH CORONARY ANGIOGRAM N/A 09/29/2011   Procedure: LEFT HEART CATHETERIZATION WITH CORONARY ANGIOGRAM;  Surgeon: Sanda Klein, MD;  Location: [facility] CATH LAB;  Service: Cardiovascular;  Laterality: N/A;  . NM MYOVIEW LTD  07/08/2011   mild LV dilatation,mild septal hypokinesia  . TONSILLECTOMY    . US ECHOCARDIOGRAPHY  09/07/2011   EF 35-45%,mod.ant hypokinesis,boderline LA & RA enlargement,trace MR,TR & PI       Home Medications    Prior to Admission medications   Medication Sig Start Date End Date Taking? Authorizing Provider  ibuprofen (ADVIL,MOTRIN) 200 MG tablet Take 200 mg by mouth every 6 (six) hours as needed.   Yes [provider]  acetaminophen (TYLENOL) 500 MG tablet Take 500 mg by mouth every 6 (six) hours as needed for pain.    [provider]  albuterol (PROVENTIL HFA;VENTOLIN HFA) 108 (90 BASE) MCG/ACT inhaler Inhale 2 puffs into the lungs every 6 (six) hours as needed. For shortness of breath    [provider]  aspirin EC 81 MG tablet Take 81 mg by mouth daily.    [provider]  budesonide-formoterol (SYMBICORT) 160-4.5 MCG/ACT inhaler Inhale 2 puffs into the lungs 2 (two) times daily as needed.  For wheezing    [provider]  buPROPion (WELLBUTRIN XL) 150 MG 24 hr tablet Take 150 mg by mouth daily.    [provider]  dextromethorphan-guaiFENesin (MUCINEX DM) 30-600 MG 12hr tablet Take 1 tablet by mouth 2 (two) times daily. 06/11/17   Fredia Sorrow, MD  diltiazem (CARDIZEM CD) 240 MG 24 hr capsule Take 240 mg by mouth daily.    [provider]  DULoxetine (CYMBALTA) 60 MG capsule Take 60 mg by mouth daily.    [provider]  fluticasone (FLONASE) 50 MCG/ACT nasal spray Place 2 sprays into the nose daily as needed. Allergies    [provider]  glimepiride (AMARYL) 1 MG tablet Take 1 mg by  mouth daily before breakfast.    [provider]  HYDROcodone-acetaminophen (NORCO/VICODIN) 5-325 MG per tablet Take 1 tablet by mouth every 6 (six) hours as needed.  05/06/14   [provider]  metFORMIN (GLUCOPHAGE) 1000 MG tablet Take 1,000 mg by mouth 2 (two) times daily with a meal.    [provider]  nebivolol (BYSTOLIC) 10 MG tablet Take 10 mg by mouth every evening.    [provider]  olmesartan-hydrochlorothiazide (BENICAR HCT) 40-12.5 MG per tablet Take 1 tablet by mouth every evening.    [provider]  omeprazole (PRILOSEC) 20 MG capsule Take 20 mg by mouth daily.    [provider]  potassium chloride (K-DUR) 10 MEQ tablet Take 1 tablet (10 mEq total) by mouth 2 (two) times daily. 06/11/17   Fredia Sorrow, MD    Family History Family History  Problem Relation Age of Onset  . Coronary artery disease Father   . Diabetes type II Father   . Heart attack Father     Social History Social History   Tobacco Use  . Smoking status: Never Smoker  . Smokeless tobacco: Never Used  . Tobacco comment: Second hand smoke for 26 years per pt  Substance Use Topics  . Alcohol use: No    Alcohol/week: 0.0 oz  . Drug use: No     Allergies   Ciprofloxacin and Levofloxacin   Review of Systems Review of Systems  Constitutional: Positive for fever.  HENT: Negative for congestion.   Eyes: Negative for redness.  Respiratory: Positive for cough. Negative for shortness of breath.   Cardiovascular: Positive for chest pain.  Gastrointestinal: Positive for nausea. Negative for abdominal pain.  Genitourinary: Positive for dysuria.  Musculoskeletal: Positive for myalgias.  Skin: Negative for rash.  Neurological: Positive for headaches.  Hematological: Does not bruise/bleed easily.  Psychiatric/Behavioral: Negative for confusion.     Physical Exam Updated Vital Signs BP (!) 138/95   Pulse 91   Temp 99 F (37.2 C) (Oral)    Resp 11   Ht 1.727 m (5\' 8" )   Wt 79.4 kg (175 lb)   SpO2 96%   BMI 26.61 kg/m   Physical Exam  Constitutional: He is oriented to person, place, and time. He appears well-developed and well-nourished. No distress.  HENT:  Head: Normocephalic and atraumatic.  Mucous membranes dry.  Eyes: Conjunctivae and EOM are normal. Pupils are equal, round, and reactive to light.  Neck: Neck supple.  Cardiovascular: Normal rate, regular rhythm and normal heart sounds.  Pulmonary/Chest: Effort normal and breath sounds normal. No respiratory distress. He has no wheezes.  Abdominal: Soft. Bowel sounds are normal. There is no tenderness.  Musculoskeletal: Normal range of motion. He exhibits no edema.  Neurological: He is alert  and oriented to person, place, and time. No cranial nerve deficit or sensory deficit. He exhibits normal muscle tone. Coordination normal.  Skin: Skin is warm. No erythema.  Nursing note and vitals reviewed.    ED Treatments / Results  Labs (all labs ordered are listed, but only abnormal results are displayed) Labs Reviewed  URINALYSIS, ROUTINE W REFLEX MICROSCOPIC - Abnormal; Notable for the following components:      Result Value   APPearance HAZY (*)    Specific Gravity, Urine <1.005 (*)    Glucose, UA >=500 (*)    Hgb urine dipstick MODERATE (*)    Ketones, ur 15 (*)    All other components within normal limits  URINALYSIS, MICROSCOPIC (REFLEX) - Abnormal; Notable for the following components:   Bacteria, UA RARE (*)    Squamous Epithelial / LPF 0-5 (*)    All other components within normal limits  I-STAT CHEM 8, ED - Abnormal; Notable for the following components:   Potassium 2.9 (*)    Chloride 97 (*)    BUN 35 (*)    Glucose, Bld 230 (*)    Calcium, Ion 1.05 (*)    All other components within normal limits  CBC WITH DIFFERENTIAL/PLATELET  TROPONIN I    EKG  EKG Interpretation  Date/Time:  Monday June 11 2017 16:04:21 EST Ventricular Rate:  82 PR  Interval:    QRS Duration: 101 QT Interval:  404 QTC Calculation: 472 R Axis:   -7 Text Interpretation:  Sinus rhythm Atrial premature complex Borderline ST depression, lateral leads Baseline wander in lead(s) V1 No significant change since last tracing Baseline wander Confirmed by Fredia Sorrow [phone]) on 06/11/2017 4:28:17 PM       Radiology Dg Chest 2 View  Result Date: 06/11/2017 CLINICAL DATA:  Cough, fever. EXAM: CHEST  2 VIEW COMPARISON:  Radiographs of July 03, 2014. FINDINGS: The heart size and mediastinal contours are within normal limits. Both lungs are clear. No pneumothorax or pleural effusion is noted. The visualized skeletal structures are unremarkable. IMPRESSION: No active cardiopulmonary disease. Electronically Signed   By: Marijo Conception, M.D.   On: 06/11/2017 14:08    Procedures Procedures (including critical care time)  CRITICAL CARE Performed by: Fredia Sorrow Total critical care time: 30 minutes Critical care time was exclusive of separately billable procedures and treating other patients. Critical care was necessary to treat or prevent imminent or life-threatening deterioration. Critical care was time spent personally by me on the following activities: development of treatment plan with patient and/or surrogate as well as nursing, discussions with consultants, evaluation of patient's response to treatment, examination of patient, obtaining history from patient or surrogate, ordering and performing treatments and interventions, ordering and review of laboratory studies, ordering and review of radiographic studies, pulse oximetry and re-evaluation of patient's condition.   Medications Ordered in ED Medications  0.9 %  sodium chloride infusion ( Intravenous Stopped 06/11/17 1927)  ipratropium-albuterol (DUONEB) 0.5-2.5 (3) MG/3ML nebulizer solution 3 mL (3 mLs Nebulization Given 06/11/17 1610)  albuterol (PROVENTIL HFA;VENTOLIN HFA) 108 (90 Base) MCG/ACT inhaler  2 puff (not administered)  sodium chloride 0.9 % bolus 1,000 mL (0 mLs Intravenous Stopped 06/11/17 1709)  potassium chloride 10 mEq in 100 mL IVPB (0 mEq Intravenous Stopped 06/11/17 1927)  potassium chloride SA (K-DUR,KLOR-CON) CR tablet 40 mEq (40 mEq Oral Given 06/11/17 1708)  potassium chloride 10 mEq in 100 mL IVPB (0 mEq Intravenous Stopped 06/11/17 1818)     Initial Impression / Assessment  and Plan / ED Course  I have reviewed the triage vital signs and the nursing notes.  Pertinent labs & imaging results that were available during my care of the patient were reviewed by me and considered in my medical decision making (see chart for details).    Workup showed hypokalemia with a potassium of 2.9.  Patient received 2 runs of 10 mEq of potassium here and 40 mg p.o.  Patient will be continued on 20 mEq of potassium at home for the next 4 days.  To follow-up with his primary care provider for recheck.  Otherwise patient with some improvement with fluids here clinically looked dehydrated.  Mucous membranes were dry.  Patient received 1 L of fluid.  Patient also received albuterol Atrovent nebulizer did not make much difference but certainly feels no worse.  Patient now urinating better.  Patient had concerns about dysuria but urinalysis shows no evidence of infection.  Patient's symptoms sound very flulike in nature I understand has had the vaccine and had a negative flu test 3 days ago.  Will treat symptomatically will need close follow-up about the potassium.  He will return for any new or worse symptoms   Final Clinical Impressions(s) / ED Diagnoses   Final diagnoses:  Influenza-like illness  Hypokalemia    ED Discharge Orders        Ordered    dextromethorphan-guaiFENesin [facility] DM) 30-600 MG 12hr tablet  2 times daily     06/11/17 1949    potassium chloride (K-DUR) 10 MEQ tablet  2 times daily     06/11/17 1949       Fredia Sorrow, MD 06/11/17 2004    Fredia Sorrow,  MD 06/11/17 2017

## 2017-06-11 NOTE — Discharge Instructions (Signed)
Take the potassium pills as directed.  Follow-up with your doctor in about a week to have potassium rechecked.  Continue to try to hydrate yourself as much as possible.  For the flulike symptoms use the Mucinex DM as directed.  And the albuterol inhaler 2 puffs every 6 hours for the next 7 days.  Return for any new or worse symptoms.

## 2017-06-11 NOTE — ED Notes (Signed)
EDP is now at bedside

## 2017-06-11 NOTE — ED Notes (Signed)
Patient with history of OSA not wearing CPAP currently.  Patient resting on room air SpO2 85-88%. No distress noted placed on 2l/m [place]

## 2017-06-11 NOTE — ED Notes (Signed)
EDP to reevaluate patient

## 2017-06-14 DIAGNOSIS — R8290 Unspecified abnormal findings in urine: Secondary | ICD-10-CM | POA: Diagnosis not present

## 2017-06-14 DIAGNOSIS — E1129 Type 2 diabetes mellitus with other diabetic kidney complication: Secondary | ICD-10-CM | POA: Diagnosis not present

## 2017-06-14 DIAGNOSIS — I10 Essential (primary) hypertension: Secondary | ICD-10-CM | POA: Diagnosis not present

## 2017-06-14 DIAGNOSIS — Z6825 Body mass index (BMI) 25.0-25.9, adult: Secondary | ICD-10-CM | POA: Diagnosis not present

## 2017-06-14 DIAGNOSIS — E876 Hypokalemia: Secondary | ICD-10-CM | POA: Diagnosis not present

## 2017-06-14 DIAGNOSIS — N179 Acute kidney failure, unspecified: Secondary | ICD-10-CM | POA: Diagnosis not present

## 2017-06-14 DIAGNOSIS — N390 Urinary tract infection, site not specified: Secondary | ICD-10-CM | POA: Diagnosis not present

## 2017-06-14 DIAGNOSIS — R0609 Other forms of dyspnea: Secondary | ICD-10-CM | POA: Diagnosis not present

## 2017-06-14 DIAGNOSIS — R300 Dysuria: Secondary | ICD-10-CM | POA: Diagnosis not present

## 2017-06-29 DIAGNOSIS — H26491 Other secondary cataract, right eye: Secondary | ICD-10-CM | POA: Diagnosis not present

## 2017-06-29 DIAGNOSIS — E119 Type 2 diabetes mellitus without complications: Secondary | ICD-10-CM | POA: Diagnosis not present

## 2017-06-29 DIAGNOSIS — H33192 Other retinoschisis and retinal cysts, left eye: Secondary | ICD-10-CM | POA: Diagnosis not present

## 2017-07-04 DIAGNOSIS — E118 Type 2 diabetes mellitus with unspecified complications: Secondary | ICD-10-CM | POA: Diagnosis not present

## 2017-07-04 DIAGNOSIS — Z6825 Body mass index (BMI) 25.0-25.9, adult: Secondary | ICD-10-CM | POA: Diagnosis not present

## 2017-07-04 DIAGNOSIS — I10 Essential (primary) hypertension: Secondary | ICD-10-CM | POA: Diagnosis not present

## 2017-08-22 DIAGNOSIS — E7849 Other hyperlipidemia: Secondary | ICD-10-CM | POA: Diagnosis not present

## 2017-08-22 DIAGNOSIS — E1129 Type 2 diabetes mellitus with other diabetic kidney complication: Secondary | ICD-10-CM | POA: Diagnosis not present

## 2017-08-22 DIAGNOSIS — Z125 Encounter for screening for malignant neoplasm of prostate: Secondary | ICD-10-CM | POA: Diagnosis not present

## 2017-08-22 DIAGNOSIS — R82998 Other abnormal findings in urine: Secondary | ICD-10-CM | POA: Diagnosis not present

## 2017-08-22 DIAGNOSIS — I10 Essential (primary) hypertension: Secondary | ICD-10-CM | POA: Diagnosis not present

## 2017-08-29 DIAGNOSIS — J45909 Unspecified asthma, uncomplicated: Secondary | ICD-10-CM | POA: Diagnosis not present

## 2017-08-29 DIAGNOSIS — F39 Unspecified mood [affective] disorder: Secondary | ICD-10-CM | POA: Diagnosis not present

## 2017-08-29 DIAGNOSIS — Z0000 Encounter for general adult medical examination without abnormal findings: Secondary | ICD-10-CM | POA: Diagnosis not present

## 2017-08-29 DIAGNOSIS — K219 Gastro-esophageal reflux disease without esophagitis: Secondary | ICD-10-CM | POA: Diagnosis not present

## 2017-08-29 DIAGNOSIS — Z23 Encounter for immunization: Secondary | ICD-10-CM | POA: Diagnosis not present

## 2017-08-29 DIAGNOSIS — E7849 Other hyperlipidemia: Secondary | ICD-10-CM | POA: Diagnosis not present

## 2017-08-29 DIAGNOSIS — I10 Essential (primary) hypertension: Secondary | ICD-10-CM | POA: Diagnosis not present

## 2017-08-29 DIAGNOSIS — N059 Unspecified nephritic syndrome with unspecified morphologic changes: Secondary | ICD-10-CM | POA: Diagnosis not present

## 2017-08-29 DIAGNOSIS — G4733 Obstructive sleep apnea (adult) (pediatric): Secondary | ICD-10-CM | POA: Diagnosis not present

## 2017-08-29 DIAGNOSIS — E118 Type 2 diabetes mellitus with unspecified complications: Secondary | ICD-10-CM | POA: Diagnosis not present

## 2017-09-05 ENCOUNTER — Ambulatory Visit (INDEPENDENT_AMBULATORY_CARE_PROVIDER_SITE_OTHER): Payer: Commercial Managed Care - PPO | Admitting: Orthopaedic Surgery

## 2017-09-06 ENCOUNTER — Ambulatory Visit (INDEPENDENT_AMBULATORY_CARE_PROVIDER_SITE_OTHER): Payer: Commercial Managed Care - PPO

## 2017-09-06 ENCOUNTER — Encounter (INDEPENDENT_AMBULATORY_CARE_PROVIDER_SITE_OTHER): Payer: Self-pay | Admitting: Orthopaedic Surgery

## 2017-09-06 ENCOUNTER — Ambulatory Visit (INDEPENDENT_AMBULATORY_CARE_PROVIDER_SITE_OTHER): Payer: Medicare HMO | Admitting: Orthopaedic Surgery

## 2017-09-06 DIAGNOSIS — M25562 Pain in left knee: Secondary | ICD-10-CM

## 2017-09-06 DIAGNOSIS — M25561 Pain in right knee: Secondary | ICD-10-CM

## 2017-09-06 DIAGNOSIS — M25552 Pain in left hip: Secondary | ICD-10-CM

## 2017-09-06 DIAGNOSIS — G8929 Other chronic pain: Secondary | ICD-10-CM | POA: Diagnosis not present

## 2017-09-06 MED ORDER — GABAPENTIN 300 MG PO CAPS: 300.0000 mg | ORAL_CAPSULE | Freq: Every day | ORAL | 3 refills | Status: DC

## 2017-09-06 MED ORDER — NABUMETONE 500 MG PO TABS: 500.0000 mg | ORAL_TABLET | Freq: Two times a day (BID) | ORAL | 0 refills | Status: DC | PRN

## 2017-09-06 NOTE — Progress Notes (Signed)
Office Visit Note   Patient: Donald [name]           Date of Birth: 19-Apr-1951           MRN: [id number] Visit Date: 09/06/2017              Requested by: Prince Solian, MD [address] [place], [place] [address] PCP: Prince Solian, MD   Assessment & Plan: Visit Diagnoses:  1. Chronic pain of both knees   2. Pain in left hip     Plan: Given the complaints that he has this may be more rheumatologic issue.  However I want to try a steroid injection in both his knees and I counseled him about the potential effect this may out of his blood glucose.  He does watch his blood glucose closely and daily.  I explained the risk and benefits of steroid injections in place a steroid injection in each knee without difficulty.  He does have stage I kidney disease but cannot take anti-inflammatories on occasion so we will put him a short course of Relafen.  I will have him work on quad strengthening exercises as well.  I demonstrated this to him and we talked in detail about this.  I would like to see him back in 4 weeks to see how he is doing overall.  Follow-Up Instructions: Return in about 1 month (around 10/04/2017).   Orders:  Orders Placed This Encounter  Procedures  . XR HIP UNILAT W OR W/O PELVIS 1V LEFT  . XR Knee 1-2 Views Left  . XR Knee 1-2 Views Right   Meds ordered this encounter  Medications  . gabapentin (NEURONTIN) 300 MG capsule    Sig: Take 1 capsule (300 mg total) by mouth at bedtime.    Dispense:  30 capsule    Refill:  3  . nabumetone (RELAFEN) 500 MG tablet    Sig: Take 1 tablet (500 mg total) by mouth 2 (two) times daily as needed.    Dispense:  60 tablet    Refill:  0      Procedures: No procedures performed   Clinical Data: No additional findings.   Subjective: Chief Complaint  Patient presents with  . Left Hip - Pain  . Right Knee - Pain  The patient is a 67 year old gentleman who comes in for evaluation treatment of left hip pain and  bilateral knee pain.  His knees have hurt for years with the right knee worse than left knee.  Is becoming more of a constant pain.  His left hip is been hurting for about 6 months or more now is painful most with walking and he does not feel stable with walking.  He feels like the hip can even come out of place there is a way he describes it.  He is also has some coccyx pain in her sit down on his backside.  He is someone who is a diabetic he used to be under not good control but he says his hemoglobin A1c is now 7.  He does have bilateral mild peripheral neuropathy and bilateral foot pain as well.  He said he is always been able to hyperextend his knees and denies any specific trauma.  HPI  Review of Systems He denies any headache, chest pain, shortness of breath, fever, chills, nausea, vomiting.  Objective: Vital Signs: There were no vitals taken for this visit.  Physical Exam He is alert and oriented x3 and in no acute distress Ortho  Exam Examination of both his hips shows fluid internal and external rotation and actually hypermobility of both hips.  There is no pain of the trochanteric area on either side and no instability of his hips.  Both knees are examined and show no effusion.  He does have tight hamstrings bilaterally.  At first it seemed like he had a flexion contracture of both knees but then he showed me how easily hyperextends both knees when he lays back.  Both knees are ligamentously stable with no effusion and no malalignment. Specialty Comments:  No specialty comments available.  Imaging: Xr Hip Unilat W Or W/o Pelvis 1v Left  Result Date: 09/06/2017 An AP pelvis and lateral left hip shows has a well-maintained joint space with no acute findings  Xr Knee 1-2 Views Left  Result Date: 09/06/2017 2 views of the left knee show well-maintained joint space with only slight narrowing and mild arthritic changes.  Is no effusion or acute findings.  There is no malalignment.  Xr  Knee 1-2 Views Right  Result Date: 09/06/2017 2 views of the right knee show no acute findings and only mild arthritic changes with joint space narrowing.  There is no malalignment of the knee.    PMFS History: Patient Active Problem List   Diagnosis Date Noted  . Dyspnea 07/05/2014  . Chronic cough 06/11/2014  . Chronic sinusitis with recurrent bronchitis 06/11/2014  . Hypoxemia requiring supplemental oxygen 06/04/2014  . OSA on CPAP 06/04/2014  . Pneumonitis 06/04/2014  . Hypoxemia 06/13/2013  . Pneumonia, interstitial ([place]) 06/13/2013  . OSA (obstructive sleep apnea) 11/14/2011  . Nocturnal hypoxemia 08/18/2011  . SOB (shortness of breath) 07/06/2011  . 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   Past Medical History:  Diagnosis Date  . Allergic rhinitis, seasonal   . Asthma   . Bronchitis   . Chronic kidney disease, stage I   . collar bone    dislocation left side 05/2014  . Degenerative joint disease of knee    bilateral  . Diabetes mellitus   . GERD (gastroesophageal reflux disease)   . Heart murmur    history of  . History of acute prostatitis   . History of hematuria   . Hyperlipidemia   . Hypertension   . IgA nephropathy   . OSA on CPAP   . Pneumonia   . Sinusitis     Family History  Problem Relation Age of Onset  . Coronary artery disease Father   . Diabetes type II Father   . Heart attack Father     Past Surgical History:  Procedure Laterality Date  . CARDIAC CATHETERIZATION  09/29/2011   normal  . CERVICAL SPINE SURGERY    . deviated septum  1930s  . HERNIA REPAIR    . LEFT HEART CATHETERIZATION WITH CORONARY ANGIOGRAM N/A 09/29/2011   Procedure: LEFT HEART CATHETERIZATION WITH CORONARY ANGIOGRAM;  Surgeon: Sanda Klein, MD;  Location: [facility];  Service: Cardiovascular;  Laterality: N/A;  . NM MYOVIEW LTD  07/08/2011   mild LV dilatation,mild septal hypokinesia  . TONSILLECTOMY    . US ECHOCARDIOGRAPHY  09/07/2011   EF 35-45%,mod.ant  hypokinesis,boderline LA & RA enlargement,trace MR,TR & PI   Social History   Occupational History  . Occupation: Herbalist: Interior and spatial designer  Tobacco Use  . Smoking status: Never Smoker  . Smokeless tobacco: Never Used  . Tobacco comment: Second hand smoke for 26 years per pt  Substance and Sexual Activity  .  Alcohol use: No    Alcohol/week: 0.0 oz  . Drug use: No  . Sexual activity: Not on file

## 2017-09-07 DIAGNOSIS — Z1212 Encounter for screening for malignant neoplasm of rectum: Secondary | ICD-10-CM | POA: Diagnosis not present

## 2017-10-08 ENCOUNTER — Ambulatory Visit (INDEPENDENT_AMBULATORY_CARE_PROVIDER_SITE_OTHER): Payer: Commercial Managed Care - PPO | Admitting: Orthopaedic Surgery

## 2017-10-09 DIAGNOSIS — R300 Dysuria: Secondary | ICD-10-CM | POA: Diagnosis not present

## 2017-10-09 DIAGNOSIS — E118 Type 2 diabetes mellitus with unspecified complications: Secondary | ICD-10-CM | POA: Diagnosis not present

## 2017-10-09 DIAGNOSIS — I10 Essential (primary) hypertension: Secondary | ICD-10-CM | POA: Diagnosis not present

## 2017-10-09 DIAGNOSIS — Z6824 Body mass index (BMI) 24.0-24.9, adult: Secondary | ICD-10-CM | POA: Diagnosis not present

## 2017-10-09 DIAGNOSIS — N181 Chronic kidney disease, stage 1: Secondary | ICD-10-CM | POA: Diagnosis not present

## 2017-11-13 DIAGNOSIS — E7849 Other hyperlipidemia: Secondary | ICD-10-CM | POA: Diagnosis not present

## 2017-11-13 DIAGNOSIS — I10 Essential (primary) hypertension: Secondary | ICD-10-CM | POA: Diagnosis not present

## 2017-11-20 DIAGNOSIS — G4733 Obstructive sleep apnea (adult) (pediatric): Secondary | ICD-10-CM | POA: Diagnosis not present

## 2017-11-20 DIAGNOSIS — E7849 Other hyperlipidemia: Secondary | ICD-10-CM | POA: Diagnosis not present

## 2017-11-20 DIAGNOSIS — R0902 Hypoxemia: Secondary | ICD-10-CM | POA: Diagnosis not present

## 2017-11-20 DIAGNOSIS — N059 Unspecified nephritic syndrome with unspecified morphologic changes: Secondary | ICD-10-CM | POA: Diagnosis not present

## 2017-11-20 DIAGNOSIS — M170 Bilateral primary osteoarthritis of knee: Secondary | ICD-10-CM | POA: Diagnosis not present

## 2017-11-20 DIAGNOSIS — Z6825 Body mass index (BMI) 25.0-25.9, adult: Secondary | ICD-10-CM | POA: Diagnosis not present

## 2017-11-20 DIAGNOSIS — E118 Type 2 diabetes mellitus with unspecified complications: Secondary | ICD-10-CM | POA: Diagnosis not present

## 2017-11-20 DIAGNOSIS — I10 Essential (primary) hypertension: Secondary | ICD-10-CM | POA: Diagnosis not present

## 2018-01-01 DIAGNOSIS — Z961 Presence of intraocular lens: Secondary | ICD-10-CM | POA: Diagnosis not present

## 2018-01-01 DIAGNOSIS — H33192 Other retinoschisis and retinal cysts, left eye: Secondary | ICD-10-CM | POA: Diagnosis not present

## 2018-01-01 DIAGNOSIS — H10411 Chronic giant papillary conjunctivitis, right eye: Secondary | ICD-10-CM | POA: Diagnosis not present

## 2018-01-16 DIAGNOSIS — N181 Chronic kidney disease, stage 1: Secondary | ICD-10-CM | POA: Diagnosis not present

## 2018-01-16 DIAGNOSIS — I10 Essential (primary) hypertension: Secondary | ICD-10-CM | POA: Diagnosis not present

## 2018-01-16 DIAGNOSIS — E118 Type 2 diabetes mellitus with unspecified complications: Secondary | ICD-10-CM | POA: Diagnosis not present

## 2018-01-16 DIAGNOSIS — Z23 Encounter for immunization: Secondary | ICD-10-CM | POA: Diagnosis not present

## 2018-01-16 DIAGNOSIS — Z6825 Body mass index (BMI) 25.0-25.9, adult: Secondary | ICD-10-CM | POA: Diagnosis not present

## 2018-02-26 DIAGNOSIS — I10 Essential (primary) hypertension: Secondary | ICD-10-CM | POA: Diagnosis not present

## 2018-02-26 DIAGNOSIS — Z6825 Body mass index (BMI) 25.0-25.9, adult: Secondary | ICD-10-CM | POA: Diagnosis not present

## 2018-02-26 DIAGNOSIS — E118 Type 2 diabetes mellitus with unspecified complications: Secondary | ICD-10-CM | POA: Diagnosis not present

## 2018-04-01 DIAGNOSIS — N059 Unspecified nephritic syndrome with unspecified morphologic changes: Secondary | ICD-10-CM | POA: Diagnosis not present

## 2018-04-01 DIAGNOSIS — I10 Essential (primary) hypertension: Secondary | ICD-10-CM | POA: Diagnosis not present

## 2018-04-01 DIAGNOSIS — E7849 Other hyperlipidemia: Secondary | ICD-10-CM | POA: Diagnosis not present

## 2018-04-01 DIAGNOSIS — Z6826 Body mass index (BMI) 26.0-26.9, adult: Secondary | ICD-10-CM | POA: Diagnosis not present

## 2018-04-01 DIAGNOSIS — J45998 Other asthma: Secondary | ICD-10-CM | POA: Diagnosis not present

## 2018-04-01 DIAGNOSIS — E118 Type 2 diabetes mellitus with unspecified complications: Secondary | ICD-10-CM | POA: Diagnosis not present

## 2018-05-16 DIAGNOSIS — E118 Type 2 diabetes mellitus with unspecified complications: Secondary | ICD-10-CM | POA: Diagnosis not present

## 2018-05-16 DIAGNOSIS — I10 Essential (primary) hypertension: Secondary | ICD-10-CM | POA: Diagnosis not present

## 2018-05-16 DIAGNOSIS — N181 Chronic kidney disease, stage 1: Secondary | ICD-10-CM | POA: Diagnosis not present

## 2018-08-15 DIAGNOSIS — I10 Essential (primary) hypertension: Secondary | ICD-10-CM | POA: Diagnosis not present

## 2018-08-15 DIAGNOSIS — E118 Type 2 diabetes mellitus with unspecified complications: Secondary | ICD-10-CM | POA: Diagnosis not present

## 2018-08-15 DIAGNOSIS — R0902 Hypoxemia: Secondary | ICD-10-CM | POA: Diagnosis not present

## 2018-08-30 DIAGNOSIS — Z125 Encounter for screening for malignant neoplasm of prostate: Secondary | ICD-10-CM | POA: Diagnosis not present

## 2018-08-30 DIAGNOSIS — R82998 Other abnormal findings in urine: Secondary | ICD-10-CM | POA: Diagnosis not present

## 2018-08-30 DIAGNOSIS — I10 Essential (primary) hypertension: Secondary | ICD-10-CM | POA: Diagnosis not present

## 2018-08-30 DIAGNOSIS — E118 Type 2 diabetes mellitus with unspecified complications: Secondary | ICD-10-CM | POA: Diagnosis not present

## 2018-08-30 DIAGNOSIS — E7849 Other hyperlipidemia: Secondary | ICD-10-CM | POA: Diagnosis not present

## 2018-09-06 DIAGNOSIS — Z0000 Encounter for general adult medical examination without abnormal findings: Secondary | ICD-10-CM | POA: Diagnosis not present

## 2018-09-06 DIAGNOSIS — G4733 Obstructive sleep apnea (adult) (pediatric): Secondary | ICD-10-CM | POA: Diagnosis not present

## 2018-09-06 DIAGNOSIS — F39 Unspecified mood [affective] disorder: Secondary | ICD-10-CM | POA: Diagnosis not present

## 2018-09-06 DIAGNOSIS — R0902 Hypoxemia: Secondary | ICD-10-CM | POA: Diagnosis not present

## 2018-09-06 DIAGNOSIS — E118 Type 2 diabetes mellitus with unspecified complications: Secondary | ICD-10-CM | POA: Diagnosis not present

## 2018-09-06 DIAGNOSIS — N183 Chronic kidney disease, stage 3 (moderate): Secondary | ICD-10-CM | POA: Diagnosis not present

## 2018-09-06 DIAGNOSIS — J45909 Unspecified asthma, uncomplicated: Secondary | ICD-10-CM | POA: Diagnosis not present

## 2018-09-06 DIAGNOSIS — I129 Hypertensive chronic kidney disease with stage 1 through stage 4 chronic kidney disease, or unspecified chronic kidney disease: Secondary | ICD-10-CM | POA: Diagnosis not present

## 2018-09-06 DIAGNOSIS — E785 Hyperlipidemia, unspecified: Secondary | ICD-10-CM | POA: Diagnosis not present

## 2018-09-09 ENCOUNTER — Telehealth: Payer: Self-pay | Admitting: Neurology

## 2018-09-09 ENCOUNTER — Encounter: Payer: Self-pay | Admitting: Neurology

## 2018-09-09 NOTE — Telephone Encounter (Signed)
Due to current COVID 19 pandemic, our office is severely reducing in office visits, in order to minimize the risk to our patients and healthcare providers.    Pt understands that although there may be some limitations with this type of visit, we will take all precautions to reduce any security or privacy concerns.  Pt understands that this will be treated like an in office visit and we will file with pt's insurance, and there may be a patient responsible charge related to this service.  Pt's email is [email or web address] Pt understands that the nurse will be calling to go over pt's chart.

## 2018-09-09 NOTE — Addendum Note (Signed)
Addended by: Darleen Crocker on: 09/09/2018 02:43 PM   Modules accepted: Orders

## 2018-09-09 NOTE — Telephone Encounter (Signed)
Called the patient to review their chart and made sure that everything was up to date. Patient informed they do have the app downloaded as well as received the e-mail for the visit. Instructed to make sure they hold on to the e-mail for the upcoming appointment as it is necessary to access their appointment. Instructed the patient that apx 30 min prior to the appointment the front staff will contact them to make sure they are ready to go for their appointment in case there is any need for troubleshooting it can be completed prior to the appointment time. Pt verbalized understanding. Patient also instructed to completed the sleep scale and reply with answers to that and neck circumference measurements prior to the appointment time.

## 2018-09-10 ENCOUNTER — Ambulatory Visit (INDEPENDENT_AMBULATORY_CARE_PROVIDER_SITE_OTHER): Payer: Medicare HMO | Admitting: Neurology

## 2018-09-10 ENCOUNTER — Other Ambulatory Visit: Payer: Self-pay

## 2018-09-10 ENCOUNTER — Encounter: Payer: Self-pay | Admitting: Neurology

## 2018-09-10 DIAGNOSIS — Z87441 Personal history of nephrotic syndrome: Secondary | ICD-10-CM

## 2018-09-10 DIAGNOSIS — Z9989 Dependence on other enabling machines and devices: Secondary | ICD-10-CM | POA: Diagnosis not present

## 2018-09-10 DIAGNOSIS — Z9981 Dependence on supplemental oxygen: Secondary | ICD-10-CM

## 2018-09-10 DIAGNOSIS — N183 Chronic kidney disease, stage 3 unspecified: Secondary | ICD-10-CM

## 2018-09-10 DIAGNOSIS — J40 Bronchitis, not specified as acute or chronic: Secondary | ICD-10-CM

## 2018-09-10 DIAGNOSIS — G4733 Obstructive sleep apnea (adult) (pediatric): Secondary | ICD-10-CM | POA: Diagnosis not present

## 2018-09-10 DIAGNOSIS — R05 Cough: Secondary | ICD-10-CM | POA: Diagnosis not present

## 2018-09-10 DIAGNOSIS — R0902 Hypoxemia: Secondary | ICD-10-CM | POA: Diagnosis not present

## 2018-09-10 DIAGNOSIS — J849 Interstitial pulmonary disease, unspecified: Secondary | ICD-10-CM | POA: Diagnosis not present

## 2018-09-10 DIAGNOSIS — J329 Chronic sinusitis, unspecified: Secondary | ICD-10-CM | POA: Diagnosis not present

## 2018-09-10 DIAGNOSIS — R053 Chronic cough: Secondary | ICD-10-CM

## 2018-09-10 NOTE — Progress Notes (Signed)
Virtual Visit via Video Note  I connected with Donald [name] on 09/10/18 at  9:30 AM EDT by a video enabled telemedicine application and verified that I am speaking with the correct person using two identifiers.  Location: Patient: at home with wife  Provider: in office   I discussed the limitations of evaluation and management by telemedicine and the availability of in person appointments. The patient expressed understanding and agreed to proceed.    History of Present Illness: patient had seen me 5 years ago but not followed up since.    SLEEP MEDICINE CLINIC   Provider:  Larey [name], M D  Primary Care Physician:  Donald Solian, MD   Referring Provider: Prince Solian, MD    No chief complaint on file.   HPI:  Donald [name] is a 68 y.o. male , seen here as in a referral from Dr. Dagmar [name] for a new evaluation of apnea. Patient is a highly compliant CPAP user since receiving a diagnosis of mild OSA and severe Hypoxemia in 2015.    Donald [name] is a meanwhile 68 year old right-handed Caucasian gentleman and avid motorcyclist.  He was first seen for a sleep evaluation in May 2013 by Dr. Danton [name].. This was followed by a sleep study dated 27 October 2011.  He was not happy with the results and care but was given an oxygen concentrator from Donald [name] he remembers that the oxygen was delivered already in March 2013 after an overnight pulse oximetry.  A new sleep evaluation was performed through me at [facility] in December 2014 and he underwent another sleep study here.  On 25 April 2013 his sleep study showed an overall AHI of only 5.7/h, and very mild degree of apnea but his oxygen nadir was 77% and he spent sustained time in low oxygen.  The total hypoxemia time at 89 and below was 2 hours and 45 minutes, the patient was therefore placed on oxygen supplement which he already owns as I understand.  He returned for a CPAP titration because I  can only order oxygen as an adjunct therapy to obstructive sleep apnea or central sleep apnea therapy.  Oxygen strictly for hypoxemia with exercise and not sleep-related would need to be provided by a pulmonologist.   CPAP titration followed on 04 July 2013 his AHI was reduced to 1.9 /h.  He reported significant difficulties to stay asleep.  His sleep latency was 65 minutes in the sleep lab.  I understand that the patient has continued to use his CPAP on a regular basis in spite of not having followed up with me.  The CPAP settings that he uses now at the same but he no longer has oxygen available.  He purchased I understand a pulse oximetry device and has used it for nights with CPAP in place and 1 night without CPAP use.  All nights had prolonged severely prolonged hypoxemia.  This only confirms what we saw in 2015 that his apnea is not the main driver of hypoxemia.  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  GERD, heart murmur, hematuria, hypercholesterolemia and hypertension, history of prostatitis, community-acquired pneumonia.  Coronary artery disease evaluation was negative with an EF of 55% in 2013.   Donald [name] struggles with sinus congestion often has headaches when sleeping on his left side feeling congested  and developing a pressure behind the eyes, he has nocturia 2-3 times each night, he appears depressed may be frustrated, stating that he is not excessively daytime sleepy but he also does not feel refreshed or restored in the mornings.  Family medical and sleep history: His father died at age 4 of coronary artery disease he also had diabetes type 2 and degenerative joint joint disease.  His mother died also early at 66 years of age had a history of nephrolithiasis and coronary artery  disease.  His sister who is 6 years younger than the patient has thyroid problems she has undergone bariatric surgery for morbid obesity..   Social history: The patient works as a Government social research officer, he enjoys riding his motorcycle, he denies any use of tobacco or alcohol but lost his job in 2016 and has been unemployed since he has a son age 83, he is married since 1980, he drinks coffee in the morning and crystallite with caffeine after 3 PM in the afternoon he has no history of shiftwork.   Sleep habits are as follows: The patient no longer has oxygen available but uses his CPAP at 9 cm water pressure which is now over 85 years old and he still using nasal pillows.  He describes a dinnertime at around 7 PM bedtime is usually around 9:30 PM always with CPAP, the bedroom is described as cool, quiet and dark he shares a bedroom with his wife.  He falls asleep usually within 15 minutes he has begun trying to sleep supine which for an apnea patient is not a preferred sleep position but he uses 3 pillows under wedge to keep himself propped up.  He states that when he sleeps on one side sinus congestion sets in and his nose.  Up.  Usually worse when he sleeps on his left side.  He wakes up 2-3 times each night for nocturia and finally rises at 8 AM but only with alarm.  He does not feel refreshed and restored spontaneously and without alarm would be sleeping until noon.  He does not nap in daytime he reports.  He has sometimes been able to fall asleep in a recliner when he is reading or relaxed.  He has isolated occurrences of sleep paralysis.  As I described he has had multiple pulse oximetry's and perform these on himself with and without CPAP in place and found that he is still significantly hypoxic at this time.  However he is without oxygen.  Review of Systems: Out of a complete 14 system review, the patient complains of only the following symptoms, and all other reviewed systems are negative. How likely are  you to doze in the following situations: 0 = not likely, 1 = slight chance, 2 = moderate chance, 3 = high chance  Sitting and Reading? Watching Television? Sitting inactive in a public place (theater or meeting)? Lying down in the afternoon when circumstances permit? Sitting and talking to someone? Sitting quietly after lunch without alcohol? In a car, while stopped for a few minutes in traffic? As a passenger in a car for an hour without a break?  Total =5/ 24   Sinus congestion especially the left paranasal and ethmoidal sinus will block and stop up, he has arthritis, he feels that he has cold feet, there is a lot of joint stiffness, daytime fatigue, he is status post anterior C-spine fusion, he has described palpitations on some nights one time associated with a tightness at night that could have been  a chest pain, frequent nocturia, not refreshed not restored.   Social History   Socioeconomic History  . Marital status: Married    Spouse name: Diane  . Number of children: 1  . Years of education: 18  . Highest education level: Not on file  Occupational History  . Occupation: Herbalist: Interior and spatial designer  Social Needs  . Financial resource strain: Not on file  . Food insecurity:    Worry: Not on file    Inability: Not on file  . Transportation needs:    Medical: Not on file    Non-medical: Not on file  Tobacco Use  . Smoking status: Never Smoker  . Smokeless tobacco: Never Used  . Tobacco comment: Second hand smoke for 26 years per pt  Substance and Sexual Activity  . Alcohol use: No    Alcohol/week: 0.0 standard drinks  . Drug use: No  . Sexual activity: Not on file  Lifestyle  . Physical activity:    Days per week: Not on file    Minutes per session: Not on file  . Stress: Not on file  Relationships  . Social connections:    Talks on phone: Not on file    Gets together: Not on file    Attends religious service: Not on file    Active member of  club or organization: Not on file    Attends meetings of clubs or organizations: Not on file    Relationship status: Not on file  . Intimate partner violence:    Fear of current or ex partner: Not on file    Emotionally abused: Not on file    Physically abused: Not on file    Forced sexual activity: Not on file  Other Topics Concern  . Not on file  Social History Narrative   Patient is married (Diane) and lives at home with his wife.   Patient has one child.   Patient is working full-time.   Patient has a Geophysicist/field seismologist.   Patient is left-handed.   Patient drinks one glass of tea daily, one soda daily.    Family History  Problem Relation Age of Onset  . Coronary artery disease Father   . Diabetes type II Father   . Heart attack Father     Past Medical History:  Diagnosis Date  . Allergic rhinitis, seasonal   . Asthma   . Bronchitis   . Chronic kidney disease, stage I   . collar bone    dislocation left side 05/2014  . Degenerative joint disease of knee    bilateral  . Diabetes mellitus   . GERD (gastroesophageal reflux disease)   . Heart murmur    history of  . History of acute prostatitis   . History of hematuria   . Hyperlipidemia   . Hypertension   . IgA nephropathy   . OSA on CPAP   . Pneumonia   . Sinusitis     Past Surgical History:  Procedure Laterality Date  . CARDIAC CATHETERIZATION  09/29/2011   normal  . CERVICAL SPINE SURGERY    . deviated septum  1930s  . HERNIA REPAIR    . LEFT HEART CATHETERIZATION WITH CORONARY ANGIOGRAM N/A 09/29/2011   Procedure: LEFT HEART CATHETERIZATION WITH CORONARY ANGIOGRAM;  Surgeon: Sanda Klein, MD;  Location: [facility];  Service: Cardiovascular;  Laterality: N/A;  . NM MYOVIEW LTD  07/08/2011   mild LV dilatation,mild septal hypokinesia  . TONSILLECTOMY    .  US ECHOCARDIOGRAPHY  09/07/2011   EF 35-45%,mod.ant hypokinesis,boderline LA & RA enlargement,trace MR,TR & PI    Current Outpatient Medications   Medication Sig Dispense Refill  . acetaminophen (TYLENOL) 500 MG tablet Take 500 mg by mouth every 6 (six) hours as needed for pain.    Marland Kitchen albuterol (PROVENTIL HFA;VENTOLIN HFA) 108 (90 BASE) MCG/ACT inhaler Inhale 2 puffs into the lungs every 6 (six) hours as needed. For shortness of breath    . aspirin EC 81 MG tablet Take 81 mg by mouth daily.    . budesonide-formoterol (SYMBICORT) 160-4.5 MCG/ACT inhaler Inhale 2 puffs into the lungs 2 (two) times daily as needed. For wheezing    . Cephalexin 500 MG tablet Take 500 mg by mouth 2 (two) times daily. x7 days    . diltiazem (CARDIZEM CD) 240 MG 24 hr capsule Take 240 mg by mouth daily.    . Dulaglutide (TRULICITY) 8.93 YB/0.1BP SOPN Inject into the skin once a week.    . DULoxetine HCl 30 MG CSDR Take 30 mg by mouth daily.     Marland Kitchen HYDROcodone-acetaminophen (NORCO/VICODIN) 5-325 MG per tablet Take 1 tablet by mouth every 6 (six) hours as needed.   0  . ibuprofen (ADVIL,MOTRIN) 200 MG tablet Take 200 mg by mouth every 6 (six) hours as needed.    . Insulin Degludec (TRESIBA [place]) Inject 28 Units into the skin daily.    . irbesartan-hydrochlorothiazide (AVALIDE) 300-12.5 MG tablet Take 1 tablet by mouth daily.    . metFORMIN (GLUCOPHAGE) 1000 MG tablet Take 1,000 mg by mouth 2 (two) times daily with a meal.    . metoprolol succinate (TOPROL-XL) 50 MG 24 hr tablet Take 50 mg by mouth daily. Take with or immediately following a meal.    . omeprazole (PRILOSEC) 20 MG capsule Take 20 mg by mouth daily.    . pravastatin (PRAVACHOL) 40 MG tablet Take 40 mg by mouth daily.     No current facility-administered medications for this visit.     Allergies as of 09/10/2018 - Review Complete 09/09/2018  Allergen Reaction Noted  . Ciprofloxacin  07/06/2011  . Levofloxacin  07/06/2011    Vitals: There were no vitals taken for this visit. Last Weight:  Wt Readings from Last 1 Encounters:  06/11/17 175 lb (79.4 kg)   ZWC:HENID is no height or weight on file  to calculate BMI.       Last Height:   Ht Readings from Last 1 Encounters:  06/11/17 5\' 8"  (1.727 m)    Observations/Objective: General: The patient is awake, alert and appears not in acute distress. The patient is not well groomed. Head: Normocephalic, atraumatic. Neck is supple. Mallampati 3,  neck circumference:14.5 " per patient .  Nasal airflow was patent through the right nostril, the patient was actually able to hold his breath for 14 to 15 seconds.  Cardiovascular:  without distended neck veins. Respiratory: able to hold breath for 20 seconds-  Skin:  Without evidence of facial edema, or rash Trunk: BMI is 26.5 kg/m.Marland Kitchen  The patient's posture is slightly stooped, his shoulders are droopy.  Neurologic exam : The patient is awake and alert, oriented to place and time.    Attention span & concentration ability appears normal.  Speech is fluent,  without dysarthria, dysphonia or aphasia.  Mood and affect are hypothymic, worried, concerned? Frustrated?   Cranial nerves: no loss of taste or smell.  Pupils are equal . Facial motor strength is symmetric and tongue and  uvula move midline. Shoulder shrug was symmetrical.  Motor exam:   symmetric  muscle bulkin upper extremities.  There is a history of a dislocated Clavicle, affecting shoulder on the left-  Neck ROM is restricted after anterior cervical fusion.  Sensory: by report only. Coordination: Alternating movements in the fingers/hands  normal without evidence of ataxia, dysmetria or tremor. Gait and station: Patient walks without assistive device .  Reviewed the patient's referral documentation, the patient has meanwhile started on Trulicity, Tresiba, still has slightly elevated blood glucose levels as documented on 08-15-2018.  Blood pressure is close to goal 130/80, he continues on diltiazem the irbesartan, hydrochlorothiazide and metoprolol.  CKD has meanwhile reached stage III GFR 30-59 according to the last visit note with Dr.  Elsworth Soho.  Creatinine 1.3.  IgA nephropathy is present.  The patient did not appear well groomed he even appeared slightly unkempt.  This may be an indication for underlying mood disorder not well controlled at this time?  Assessment and Plan:This patient had 5 years ago such mild OSA and severe hypoxemia, but it was the OSA diagnosis that allowed prescription of oxygen supplement. It was the fact that CPAP alone did not correct the hypoxemia after it corrected the apnea.    Follow Up Instructions:  HST to evaluate if OSA is still present  but also to screen once more for hypoxemia- and for my record.   I will not need to order a new CPAP but definitely supplies. I suspect that hypoxemia will not be enough to allow re-start of oxygen, but patient indicated he is willing to purchase privately.    I discussed the assessment and treatment plan with the patient. The patient was provided an opportunity to ask questions and all were answered. The patient agreed with the plan and demonstrated an understanding of the instructions.   The patient was advised to call back or seek an in-person evaluation if the symptoms worsen or if the condition fails to improve as anticipated.  I provided 29 minutes of non-face-to-face time during this encounter.   Donald Seat, MD     Burnard Hawthorne, MD 07/13/1694, 78:93 AM  Certified in Neurology by ABPN Certified in [place] by [facility] [address] [place], [place] [address]

## 2018-09-10 NOTE — Patient Instructions (Signed)
HST to evaluate if OSA is still present  but also to screen once more for hypoxemia- and for my record.   I may need to order a new CPAP but definitely supplies.  I suspect that hypoxemia will not be enough to allow re-start of oxygen by [employer] standarts, but patient indicated he is willing to purchase privately.   Screening for Sleep Apnea  Sleep apnea is a condition in which breathing pauses or becomes shallow during sleep. Sleep apnea screening is a test to determine if you are at risk for sleep apnea. The test is easy and only takes a few minutes. Your health care provider may ask you to have this test in preparation for surgery or as part of a physical exam. What are the symptoms of sleep apnea? Common symptoms of sleep apnea include:  Snoring.  Restless sleep.  Daytime sleepiness.  Pauses in breathing.  Choking during sleep.  Irritability.  Forgetfulness.  Trouble thinking clearly.  Depression.  Personality changes. Most people with sleep apnea are not aware that they have it. Why should I get screened? Getting screened for sleep apnea can help:  Ensure your safety. It is important for your health care providers to know whether or not you have sleep apnea, especially if you are having surgery or have other long-term (chronic) health conditions.  Improve your health and allow you to get a better night's rest. Restful sleep can help you: ? Have more energy. ? Lose weight. ? Improve high blood pressure. ? Improve diabetes management. ? Prevent stroke. ? Prevent car accidents. How is screening done? Screening usually includes being asked a list of questions about your sleep quality. Some questions you may be asked include:  Do you snore?  Is your sleep restless?  Do you have daytime sleepiness?  Has a partner or spouse told you that you stop breathing during sleep?  Have you had trouble concentrating or memory loss? If your screening test is positive, you are  at risk for the condition. Further testing may be needed to confirm a diagnosis of sleep apnea. Where to find more information You can find screening tools online or at your health care clinic. For more information about sleep apnea screening and healthy sleep, visit these websites:  Centers for Disease Control and Prevention: [employer]pl  American Sleep Apnea Association: www.sleepapnea.org Contact a health care provider if:  You think that you may have sleep apnea. Summary  Sleep apnea screening can help determine if you are at risk for sleep apnea.  It is important for your health care providers to know whether or not you have sleep apnea, especially if you are having surgery or have other chronic health conditions.  You may be asked to take a screening test for sleep apnea in preparation for surgery or as part of a physical exam. This information is not intended to replace advice given to you by your health care provider. Make sure you discuss any questions you have with your health care provider. Document Released: 08/04/2016 Document Revised: 08/04/2016 Document Reviewed: 08/04/2016 Elsevier Interactive Patient Education  2019 [employer].

## 2018-10-01 ENCOUNTER — Other Ambulatory Visit: Payer: Self-pay

## 2018-10-01 ENCOUNTER — Ambulatory Visit (INDEPENDENT_AMBULATORY_CARE_PROVIDER_SITE_OTHER): Payer: Medicare HMO | Admitting: Neurology

## 2018-10-01 DIAGNOSIS — R053 Chronic cough: Secondary | ICD-10-CM

## 2018-10-01 DIAGNOSIS — J849 Interstitial pulmonary disease, unspecified: Secondary | ICD-10-CM

## 2018-10-01 DIAGNOSIS — G4733 Obstructive sleep apnea (adult) (pediatric): Secondary | ICD-10-CM | POA: Diagnosis not present

## 2018-10-01 DIAGNOSIS — J329 Chronic sinusitis, unspecified: Secondary | ICD-10-CM

## 2018-10-01 DIAGNOSIS — J40 Bronchitis, not specified as acute or chronic: Secondary | ICD-10-CM

## 2018-10-01 DIAGNOSIS — Z9981 Dependence on supplemental oxygen: Secondary | ICD-10-CM

## 2018-10-01 DIAGNOSIS — R0902 Hypoxemia: Secondary | ICD-10-CM

## 2018-10-01 DIAGNOSIS — N183 Chronic kidney disease, stage 3 unspecified: Secondary | ICD-10-CM

## 2018-10-01 DIAGNOSIS — Z87441 Personal history of nephrotic syndrome: Secondary | ICD-10-CM

## 2018-10-01 DIAGNOSIS — R05 Cough: Secondary | ICD-10-CM

## 2018-10-03 ENCOUNTER — Encounter: Payer: Self-pay | Admitting: Neurology

## 2018-10-03 DIAGNOSIS — N183 Chronic kidney disease, stage 3 unspecified: Secondary | ICD-10-CM | POA: Insufficient documentation

## 2018-10-03 DIAGNOSIS — N1831 Chronic kidney disease, stage 3a: Secondary | ICD-10-CM | POA: Insufficient documentation

## 2018-10-03 NOTE — Procedures (Signed)
  Patient Information     First Name: Donald Last Name: [name] Overbeck: [id number]  Birth Date: 07/31/1950 Age: 68 Gender: Male  Referring Provider: Prince Solian, MD BMI: 26.4 (W=174 lb, H=5' 8'')  Neck Circ.:  15 '' Epworth:  5/24  Sleep Study Information    Study Date: Oct 01, 2018 S/H/A Version: [id number] / 4.1.1528 / 46  History:   Mr. Antonio Creswell. Lease is a meanwhile 68 year old right-handed Caucasian gentleman and avid motorcyclist.  He was first seen for a sleep evaluation in May 2013 by Dr. Danton Sewer. This was followed by a sleep study dated 27 October 2011.  He was given an oxygen concentrator from DME, he remembers that oxygen was delivered to his home already in March 2013 after an overnight pulse oximetry.  A new sleep evaluation was performed through [facility] in December 2014.  On 25 April 2013 his sleep study showed an overall AHI of only 5.7/h, and very mild degree of apnea- but his oxygen nadir was 77% and he spent sustained time in hypoxemia.  TST with Spo2 at 89% and below was 2 hours and 45 minutes, the patient's oxygen therapy was therefore continued.       Diagnosis:       The AHI ( Apnea Hypopnea Index) during this HST was 37.9/h and is reflecting a severe degree of Obstructive Sleep Apnea. There were over 270 of sleep in hypoxemia at or below 88% Spo2 recorded, definitely indicating need for 02 supplementation.  Loud snoring was associated.  Please note that sleeping on the left side had an impact on lowering the AHI , but on the right side the AHI was highest.   Recommendations:      CPAP is needed due to degree of apnea and the associated severe hypoxia. The patient will be given an auto-titration capable CPAP device with a setting from 6 through 18 cm water, 3 cm EPR and oxygen will be bled into the device - the interface delivering oxygen and PAP is of patient's choice and comfort.        Electronically Signed:  Larey Seat, MD   10-03-2018

## 2018-10-03 NOTE — Addendum Note (Signed)
Addended by: Larey Seat on: 10/03/2018 12:39 PM   Modules accepted: Orders

## 2018-10-07 ENCOUNTER — Other Ambulatory Visit: Payer: Self-pay | Admitting: Neurology

## 2018-10-07 DIAGNOSIS — G4733 Obstructive sleep apnea (adult) (pediatric): Secondary | ICD-10-CM

## 2018-10-07 DIAGNOSIS — R0902 Hypoxemia: Secondary | ICD-10-CM

## 2018-10-07 DIAGNOSIS — Z9981 Dependence on supplemental oxygen: Secondary | ICD-10-CM

## 2018-10-07 DIAGNOSIS — J40 Bronchitis, not specified as acute or chronic: Secondary | ICD-10-CM

## 2018-10-07 DIAGNOSIS — R053 Chronic cough: Secondary | ICD-10-CM

## 2018-10-07 DIAGNOSIS — R05 Cough: Secondary | ICD-10-CM

## 2018-10-07 DIAGNOSIS — J329 Chronic sinusitis, unspecified: Secondary | ICD-10-CM

## 2018-10-21 DIAGNOSIS — H26491 Other secondary cataract, right eye: Secondary | ICD-10-CM | POA: Diagnosis not present

## 2018-10-21 DIAGNOSIS — E119 Type 2 diabetes mellitus without complications: Secondary | ICD-10-CM | POA: Diagnosis not present

## 2018-11-19 DIAGNOSIS — R05 Cough: Secondary | ICD-10-CM | POA: Diagnosis not present

## 2018-11-19 DIAGNOSIS — I129 Hypertensive chronic kidney disease with stage 1 through stage 4 chronic kidney disease, or unspecified chronic kidney disease: Secondary | ICD-10-CM | POA: Diagnosis not present

## 2018-11-19 DIAGNOSIS — K219 Gastro-esophageal reflux disease without esophagitis: Secondary | ICD-10-CM | POA: Diagnosis not present

## 2018-11-19 DIAGNOSIS — G4733 Obstructive sleep apnea (adult) (pediatric): Secondary | ICD-10-CM | POA: Diagnosis not present

## 2018-11-19 DIAGNOSIS — N183 Chronic kidney disease, stage 3 (moderate): Secondary | ICD-10-CM | POA: Diagnosis not present

## 2018-11-21 DIAGNOSIS — H26491 Other secondary cataract, right eye: Secondary | ICD-10-CM | POA: Diagnosis not present

## 2018-11-28 DIAGNOSIS — Z794 Long term (current) use of insulin: Secondary | ICD-10-CM | POA: Insufficient documentation

## 2018-11-28 DIAGNOSIS — N183 Chronic kidney disease, stage 3 (moderate): Secondary | ICD-10-CM | POA: Diagnosis not present

## 2018-11-28 DIAGNOSIS — E118 Type 2 diabetes mellitus with unspecified complications: Secondary | ICD-10-CM | POA: Diagnosis not present

## 2018-11-28 DIAGNOSIS — I129 Hypertensive chronic kidney disease with stage 1 through stage 4 chronic kidney disease, or unspecified chronic kidney disease: Secondary | ICD-10-CM | POA: Diagnosis not present

## 2018-12-18 DIAGNOSIS — I129 Hypertensive chronic kidney disease with stage 1 through stage 4 chronic kidney disease, or unspecified chronic kidney disease: Secondary | ICD-10-CM | POA: Diagnosis not present

## 2018-12-18 DIAGNOSIS — R05 Cough: Secondary | ICD-10-CM | POA: Diagnosis not present

## 2018-12-18 DIAGNOSIS — K219 Gastro-esophageal reflux disease without esophagitis: Secondary | ICD-10-CM | POA: Diagnosis not present

## 2018-12-18 DIAGNOSIS — N183 Chronic kidney disease, stage 3 (moderate): Secondary | ICD-10-CM | POA: Diagnosis not present

## 2018-12-25 ENCOUNTER — Other Ambulatory Visit: Payer: Self-pay

## 2018-12-25 ENCOUNTER — Observation Stay (HOSPITAL_COMMUNITY)
Admission: EM | Admit: 2018-12-25 | Discharge: 2018-12-27 | Disposition: A | Discharge status: Home / Self Care | Payer: Medicare HMO | Attending: Internal Medicine | Admitting: Internal Medicine

## 2018-12-25 ENCOUNTER — Emergency Department (HOSPITAL_COMMUNITY): Payer: Medicare HMO

## 2018-12-25 DIAGNOSIS — K760 Fatty (change of) liver, not elsewhere classified: Secondary | ICD-10-CM | POA: Insufficient documentation

## 2018-12-25 DIAGNOSIS — K429 Umbilical hernia without obstruction or gangrene: Secondary | ICD-10-CM | POA: Diagnosis not present

## 2018-12-25 DIAGNOSIS — E119 Type 2 diabetes mellitus without complications: Secondary | ICD-10-CM | POA: Diagnosis not present

## 2018-12-25 DIAGNOSIS — K219 Gastro-esophageal reflux disease without esophagitis: Secondary | ICD-10-CM | POA: Diagnosis not present

## 2018-12-25 DIAGNOSIS — D72829 Elevated white blood cell count, unspecified: Secondary | ICD-10-CM | POA: Diagnosis not present

## 2018-12-25 DIAGNOSIS — N329 Bladder disorder, unspecified: Secondary | ICD-10-CM | POA: Diagnosis not present

## 2018-12-25 DIAGNOSIS — Z7982 Long term (current) use of aspirin: Secondary | ICD-10-CM | POA: Diagnosis not present

## 2018-12-25 DIAGNOSIS — Z20828 Contact with and (suspected) exposure to other viral communicable diseases: Secondary | ICD-10-CM | POA: Insufficient documentation

## 2018-12-25 DIAGNOSIS — N181 Chronic kidney disease, stage 1: Secondary | ICD-10-CM | POA: Insufficient documentation

## 2018-12-25 DIAGNOSIS — I10 Essential (primary) hypertension: Secondary | ICD-10-CM | POA: Diagnosis present

## 2018-12-25 DIAGNOSIS — Z87440 Personal history of urinary (tract) infections: Secondary | ICD-10-CM | POA: Diagnosis not present

## 2018-12-25 DIAGNOSIS — Z8249 Family history of ischemic heart disease and other diseases of the circulatory system: Secondary | ICD-10-CM | POA: Diagnosis not present

## 2018-12-25 DIAGNOSIS — K4020 Bilateral inguinal hernia, without obstruction or gangrene, not specified as recurrent: Secondary | ICD-10-CM | POA: Insufficient documentation

## 2018-12-25 DIAGNOSIS — E785 Hyperlipidemia, unspecified: Secondary | ICD-10-CM | POA: Diagnosis not present

## 2018-12-25 DIAGNOSIS — Z9989 Dependence on other enabling machines and devices: Secondary | ICD-10-CM | POA: Diagnosis not present

## 2018-12-25 DIAGNOSIS — M47816 Spondylosis without myelopathy or radiculopathy, lumbar region: Secondary | ICD-10-CM | POA: Diagnosis not present

## 2018-12-25 DIAGNOSIS — N390 Urinary tract infection, site not specified: Secondary | ICD-10-CM | POA: Diagnosis not present

## 2018-12-25 DIAGNOSIS — Z03818 Encounter for observation for suspected exposure to other biological agents ruled out: Secondary | ICD-10-CM | POA: Diagnosis not present

## 2018-12-25 DIAGNOSIS — E876 Hypokalemia: Secondary | ICD-10-CM | POA: Insufficient documentation

## 2018-12-25 DIAGNOSIS — J45909 Unspecified asthma, uncomplicated: Secondary | ICD-10-CM | POA: Insufficient documentation

## 2018-12-25 DIAGNOSIS — N281 Cyst of kidney, acquired: Secondary | ICD-10-CM | POA: Insufficient documentation

## 2018-12-25 DIAGNOSIS — I2510 Atherosclerotic heart disease of native coronary artery without angina pectoris: Secondary | ICD-10-CM | POA: Insufficient documentation

## 2018-12-25 DIAGNOSIS — M5136 Other intervertebral disc degeneration, lumbar region: Secondary | ICD-10-CM | POA: Insufficient documentation

## 2018-12-25 DIAGNOSIS — R10827 Generalized rebound abdominal tenderness: Secondary | ICD-10-CM | POA: Diagnosis not present

## 2018-12-25 DIAGNOSIS — K5730 Diverticulosis of large intestine without perforation or abscess without bleeding: Secondary | ICD-10-CM | POA: Diagnosis not present

## 2018-12-25 DIAGNOSIS — I129 Hypertensive chronic kidney disease with stage 1 through stage 4 chronic kidney disease, or unspecified chronic kidney disease: Secondary | ICD-10-CM | POA: Insufficient documentation

## 2018-12-25 DIAGNOSIS — R509 Fever, unspecified: Secondary | ICD-10-CM | POA: Diagnosis not present

## 2018-12-25 DIAGNOSIS — Z794 Long term (current) use of insulin: Secondary | ICD-10-CM

## 2018-12-25 DIAGNOSIS — Z79899 Other long term (current) drug therapy: Secondary | ICD-10-CM | POA: Insufficient documentation

## 2018-12-25 DIAGNOSIS — E1122 Type 2 diabetes mellitus with diabetic chronic kidney disease: Secondary | ICD-10-CM | POA: Diagnosis not present

## 2018-12-25 DIAGNOSIS — I700 Atherosclerosis of aorta: Secondary | ICD-10-CM | POA: Insufficient documentation

## 2018-12-25 DIAGNOSIS — G4733 Obstructive sleep apnea (adult) (pediatric): Secondary | ICD-10-CM | POA: Diagnosis not present

## 2018-12-25 DIAGNOSIS — R1032 Left lower quadrant pain: Secondary | ICD-10-CM | POA: Diagnosis not present

## 2018-12-25 DIAGNOSIS — N400 Enlarged prostate without lower urinary tract symptoms: Secondary | ICD-10-CM | POA: Insufficient documentation

## 2018-12-25 DIAGNOSIS — N3000 Acute cystitis without hematuria: Secondary | ICD-10-CM

## 2018-12-25 DIAGNOSIS — R1084 Generalized abdominal pain: Secondary | ICD-10-CM | POA: Diagnosis not present

## 2018-12-25 LAB — COMPREHENSIVE METABOLIC PANEL
ALT: 83 U/L — ABNORMAL HIGH (ref 0–44)
AST: 65 U/L — ABNORMAL HIGH (ref 15–41)
Albumin: 3.7 g/dL (ref 3.5–5.0)
Alkaline Phosphatase: 44 U/L (ref 38–126)
Anion gap: 15 (ref 5–15)
BUN: 18 mg/dL (ref 8–23)
CO2: 26 mmol/L (ref 22–32)
Calcium: 9.1 mg/dL (ref 8.9–10.3)
Chloride: 98 mmol/L (ref 98–111)
Creatinine, Ser: 1.01 mg/dL (ref 0.61–1.24)
GFR calc Af Amer: >60 mL/min (ref >60)
GFR calc non Af Amer: >60 mL/min (ref >60)
Glucose, Bld: 185 mg/dL — ABNORMAL HIGH (ref 70–99)
Potassium: 3.4 mmol/L — ABNORMAL LOW (ref 3.5–5.1)
Sodium: 139 mmol/L (ref 135–145)
Total Bilirubin: 0.6 mg/dL (ref 0.3–1.2)
Total Protein: 6.6 g/dL (ref 6.5–8.1)

## 2018-12-25 LAB — CBC WITH DIFFERENTIAL/PLATELET
Abs Immature Granulocytes: 0.06 10*3/uL (ref 0.00–0.07)
Basophils Absolute: 0.1 10*3/uL (ref 0.0–0.1)
Basophils Relative: 1 %
Eosinophils Absolute: 0.5 10*3/uL (ref 0.0–0.5)
Eosinophils Relative: 4 %
HCT: 47.5 % (ref 39.0–52.0)
Hemoglobin: 15.6 g/dL (ref 13.0–17.0)
Immature Granulocytes: 1 %
Lymphocytes Relative: 20 %
Lymphs Abs: 2.3 10*3/uL (ref 0.7–4.0)
MCH: 29.4 pg (ref 26.0–34.0)
MCHC: 32.8 g/dL (ref 30.0–36.0)
MCV: 89.5 fL (ref 80.0–100.0)
Monocytes Absolute: 0.7 10*3/uL (ref 0.1–1.0)
Monocytes Relative: 6 %
Neutro Abs: 8.1 10*3/uL — ABNORMAL HIGH (ref 1.7–7.7)
Neutrophils Relative %: 68 %
Platelets: 299 10*3/uL (ref 150–400)
RBC: 5.31 MIL/uL (ref 4.22–5.81)
RDW: 14.2 % (ref 11.5–15.5)
WBC: 11.7 10*3/uL — ABNORMAL HIGH (ref 4.0–10.5)
nRBC: 0 % (ref 0.0–0.2)

## 2018-12-25 LAB — URINALYSIS, ROUTINE W REFLEX MICROSCOPIC
Bilirubin Urine: NEGATIVE
Glucose, UA: NEGATIVE mg/dL
Hgb urine dipstick: NEGATIVE
Ketones, ur: NEGATIVE mg/dL
Nitrite: NEGATIVE
Protein, ur: 30 mg/dL — AB
Specific Gravity, Urine: 1.016 (ref 1.005–1.030)
pH: 6 (ref 5.0–8.0)

## 2018-12-25 LAB — CBG MONITORING, ED
Glucose-Capillary: 104 mg/dL — ABNORMAL HIGH (ref 70–99)
Glucose-Capillary: 139 mg/dL — ABNORMAL HIGH (ref 70–99)

## 2018-12-25 LAB — LIPASE, BLOOD: Lipase: 28 U/L (ref 11–51)

## 2018-12-25 MED ORDER — INSULIN ASPART 100 UNIT/ML ~~LOC~~ SOLN
4.0000 [IU] | Freq: Three times a day (TID) | SUBCUTANEOUS | Status: DC
  Administered 2018-12-26 – 2018-12-27 (×5): 4 [IU] via SUBCUTANEOUS

## 2018-12-25 MED ORDER — INSULIN ASPART 100 UNIT/ML FLEXPEN: 8.0000 [IU] | PEN_INJECTOR | Freq: Three times a day (TID) | SUBCUTANEOUS | Status: DC

## 2018-12-25 MED ORDER — CEPHALEXIN 500 MG PO CAPS: 500.0000 mg | ORAL_CAPSULE | Freq: Two times a day (BID) | ORAL | 0 refills | Status: DC

## 2018-12-25 MED ORDER — IOHEXOL 300 MG/ML  SOLN
100.0000 mL | Freq: Once | INTRAMUSCULAR | Status: AC | PRN
  Administered 2018-12-25: 18:00:00 100 mL via INTRAVENOUS

## 2018-12-25 MED ORDER — ACETAMINOPHEN 325 MG PO TABS
650.0000 mg | ORAL_TABLET | Freq: Four times a day (QID) | ORAL | Status: DC | PRN
  Administered 2018-12-26: 650 mg via ORAL
  Filled 2018-12-25: qty 2

## 2018-12-25 MED ORDER — SODIUM CHLORIDE 0.9 % IV SOLN
1.0000 g | Freq: Once | INTRAVENOUS | Status: AC
  Administered 2018-12-25: 1 g via INTRAVENOUS
  Filled 2018-12-25: qty 10

## 2018-12-25 MED ORDER — ACETAMINOPHEN 650 MG RE SUPP: 650.0000 mg | Freq: Four times a day (QID) | RECTAL | Status: DC | PRN

## 2018-12-25 MED ORDER — ALBUTEROL SULFATE HFA 108 (90 BASE) MCG/ACT IN AERS: 2.0000 | INHALATION_SPRAY | Freq: Four times a day (QID) | RESPIRATORY_TRACT | Status: DC | PRN

## 2018-12-25 MED ORDER — PRAVASTATIN SODIUM 40 MG PO TABS
40.0000 mg | ORAL_TABLET | Freq: Every evening | ORAL | Status: DC
  Administered 2018-12-26: 40 mg via ORAL
  Filled 2018-12-25: qty 1

## 2018-12-25 MED ORDER — METFORMIN HCL 500 MG PO TABS
1000.0000 mg | ORAL_TABLET | Freq: Two times a day (BID) | ORAL | Status: DC
  Administered 2018-12-26: 1000 mg via ORAL
  Filled 2018-12-25: qty 2

## 2018-12-25 MED ORDER — SODIUM CHLORIDE 0.9 % IV SOLN
1.0000 g | INTRAVENOUS | Status: DC
  Filled 2018-12-25: qty 10

## 2018-12-25 MED ORDER — ENOXAPARIN SODIUM 40 MG/0.4ML ~~LOC~~ SOLN
40.0000 mg | SUBCUTANEOUS | Status: DC
  Administered 2018-12-26 – 2018-12-27 (×2): 40 mg via SUBCUTANEOUS
  Filled 2018-12-25 (×2): qty 0.4

## 2018-12-25 MED ORDER — METOPROLOL SUCCINATE ER 50 MG PO TB24
50.0000 mg | ORAL_TABLET | Freq: Every evening | ORAL | Status: DC
  Administered 2018-12-26: 50 mg via ORAL
  Filled 2018-12-25: qty 1

## 2018-12-25 MED ORDER — DILTIAZEM HCL ER COATED BEADS 240 MG PO CP24
240.0000 mg | ORAL_CAPSULE | Freq: Every day | ORAL | Status: DC
  Administered 2018-12-26 – 2018-12-27 (×2): 240 mg via ORAL
  Filled 2018-12-25 (×2): qty 1
  Filled 2018-12-25 (×2): qty 2

## 2018-12-25 MED ORDER — INSULIN GLARGINE 100 UNIT/ML ~~LOC~~ SOLN
40.0000 [IU] | Freq: Every day | SUBCUTANEOUS | Status: DC
  Administered 2018-12-26 – 2018-12-27 (×2): 40 [IU] via SUBCUTANEOUS
  Filled 2018-12-25 (×2): qty 0.4

## 2018-12-25 MED ORDER — IRBESARTAN-HYDROCHLOROTHIAZIDE 300-12.5 MG PO TABS: 1.0000 | ORAL_TABLET | Freq: Every evening | ORAL | Status: DC

## 2018-12-25 MED ORDER — OMEGA-3-ACID ETHYL ESTERS 1 G PO CAPS
2.0000 g | ORAL_CAPSULE | Freq: Every day | ORAL | Status: DC
  Administered 2018-12-26 – 2018-12-27 (×2): 2 g via ORAL
  Filled 2018-12-25 (×2): qty 2

## 2018-12-25 MED ORDER — ASPIRIN EC 81 MG PO TBEC
81.0000 mg | DELAYED_RELEASE_TABLET | Freq: Every evening | ORAL | Status: DC
  Administered 2018-12-26: 81 mg via ORAL
  Filled 2018-12-25: qty 1

## 2018-12-25 MED ORDER — SODIUM CHLORIDE 0.9 % IV BOLUS
1000.0000 mL | Freq: Once | INTRAVENOUS | Status: AC
  Administered 2018-12-25: 1000 mL via INTRAVENOUS

## 2018-12-25 MED ORDER — INSULIN ASPART 100 UNIT/ML ~~LOC~~ SOLN
0.0000 [IU] | Freq: Three times a day (TID) | SUBCUTANEOUS | Status: DC
  Administered 2018-12-26 (×2): 5 [IU] via SUBCUTANEOUS
  Administered 2018-12-26: 3 [IU] via SUBCUTANEOUS
  Administered 2018-12-27: 8 [IU] via SUBCUTANEOUS
  Administered 2018-12-27: 3 [IU] via SUBCUTANEOUS

## 2018-12-25 MED ORDER — DULOXETINE HCL 30 MG PO CPEP
30.0000 mg | ORAL_CAPSULE | Freq: Every day | ORAL | Status: DC
  Administered 2018-12-26 – 2018-12-27 (×2): 30 mg via ORAL
  Filled 2018-12-25 (×2): qty 1

## 2018-12-25 MED ORDER — ONDANSETRON HCL 4 MG/2ML IJ SOLN: 4.0000 mg | Freq: Four times a day (QID) | INTRAMUSCULAR | Status: DC | PRN

## 2018-12-25 MED ORDER — PANTOPRAZOLE SODIUM 40 MG PO TBEC
40.0000 mg | DELAYED_RELEASE_TABLET | Freq: Every day | ORAL | Status: DC
  Administered 2018-12-26 – 2018-12-27 (×2): 40 mg via ORAL
  Filled 2018-12-25 (×2): qty 1

## 2018-12-25 MED ORDER — ONDANSETRON HCL 4 MG PO TABS: 4.0000 mg | ORAL_TABLET | Freq: Four times a day (QID) | ORAL | Status: DC | PRN

## 2018-12-25 MED ORDER — FENTANYL CITRATE (PF) 100 MCG/2ML IJ SOLN
50.0000 ug | Freq: Once | INTRAMUSCULAR | Status: AC
  Administered 2018-12-25: 50 ug via INTRAVENOUS
  Filled 2018-12-25: qty 2

## 2018-12-25 NOTE — ED Triage Notes (Signed)
Pt here sent by PCP for further evaluation of a possible Bowel obstruction ; pt c/o left sided  abd pain that began today at 7am ; pt denies any n/v/d; pt states he had a normal BM yesterday ; pt denies any fevers ; hx of diverticulosis

## 2018-12-25 NOTE — ED Notes (Signed)
Pt on nasal 02 at home  And c-pap at night

## 2018-12-25 NOTE — ED Provider Notes (Addendum)
[facility] EMERGENCY DEPARTMENT Provider Note   CSN: [id number] Arrival date & time: 12/25/18  1438    History   Chief Complaint Chief Complaint  Patient presents with  . Abdominal Pain    HPI Donald [name] is a 68 y.o. male.     The history is provided by the patient.  Abdominal Pain Pain location:  LLQ Pain quality: aching and dull   Pain radiates to:  Does not radiate Pain severity:  Mild Onset quality:  Gradual Duration:  1 day Timing:  Constant Progression:  Unchanged Chronicity:  New Context comment:  Left lower quadrant abdominal today, saw PCP who sent for CT scan to r/o bowel obstruction, diverticulitis Relieved by:  Nothing Worsened by:  Nothing Associated symptoms: nausea   Associated symptoms: no chest pain, no chills, no cough, no dysuria, no fever, no hematuria, no shortness of breath, no sore throat and no vomiting   Risk factors: has not had multiple surgeries     Past Medical History:  Diagnosis Date  . Allergic rhinitis, seasonal   . Asthma   . Bronchitis   . Chronic kidney disease, stage I   . collar bone    dislocation left side 05/2014  . Degenerative joint disease of knee    bilateral  . Diabetes mellitus   . GERD (gastroesophageal reflux disease)   . Heart murmur    history of  . History of acute prostatitis   . History of hematuria   . Hyperlipidemia   . Hypertension   . IgA nephropathy   . OSA on CPAP   . Pneumonia   . Sinusitis     Patient Active Problem List   Diagnosis Date Noted  . UTI (urinary tract infection) 12/25/2018  . CKD (chronic kidney disease), stage III ([place]) 10/03/2018  . Dyspnea 07/05/2014  . Chronic cough 06/11/2014  . Chronic sinusitis with recurrent bronchitis 06/11/2014  . Hypoxemia requiring supplemental oxygen 06/04/2014  . OSA on CPAP 06/04/2014  . Pneumonitis 06/04/2014  . Hypoxemia 06/13/2013  . Pneumonia, interstitial ([place]) 06/13/2013  . OSA (obstructive sleep apnea)  11/14/2011  . Nocturnal hypoxemia 08/18/2011  . SOB (shortness of breath) 07/06/2011  . 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    Past Surgical History:  Procedure Laterality Date  . CARDIAC CATHETERIZATION  09/29/2011   normal  . CERVICAL SPINE SURGERY    . deviated septum  1930s  . HERNIA REPAIR    . LEFT HEART CATHETERIZATION WITH CORONARY ANGIOGRAM N/A 09/29/2011   Procedure: LEFT HEART CATHETERIZATION WITH CORONARY ANGIOGRAM;  Surgeon: Sanda Klein, MD;  Location: [facility] CATH LAB;  Service: Cardiovascular;  Laterality: N/A;  . NM MYOVIEW LTD  07/08/2011   mild LV dilatation,mild septal hypokinesia  . TONSILLECTOMY    . US ECHOCARDIOGRAPHY  09/07/2011   EF 35-45%,mod.ant hypokinesis,boderline LA & RA enlargement,trace MR,TR & PI        Home Medications    Prior to Admission medications   Medication Sig Start Date End Date Taking? Authorizing Provider  albuterol (PROVENTIL HFA;VENTOLIN HFA) 108 (90 BASE) MCG/ACT inhaler Inhale 2 puffs into the lungs every 6 (six) hours as needed. For shortness of breath   Yes [provider]  Ascorbic Acid (VITAMIN C PO) Take 3 tablets by mouth daily.   Yes [provider]  aspirin EC 81 MG tablet Take 81 mg by mouth every evening.    Yes [provider]  CRANBERRY PO Take 1 tablet by mouth daily.  Yes [provider]  Cyanocobalamin (VITAMIN B12 PO) Take 1 tablet by mouth daily.   Yes [provider]  diltiazem (CARDIZEM CD) 240 MG 24 hr capsule Take 240 mg by mouth daily.   Yes [provider]  DULoxetine HCl 30 MG CSDR Take 30 mg by mouth daily.    Yes [provider]  ibuprofen (ADVIL,MOTRIN) 200 MG tablet Take 200 mg by mouth every 6 (six) hours as needed.   Yes [provider]  Insulin Degludec (TRESIBA Chappell) Inject 50 Units into the skin daily.    Yes [provider]  irbesartan-hydrochlorothiazide (AVALIDE) 300-12.5 MG tablet Take 1 tablet by mouth every evening.     Yes [provider]  metFORMIN (GLUCOPHAGE) 1000 MG tablet Take 1,000 mg by mouth 2 (two) times daily with a meal.   Yes [provider]  metoprolol succinate (TOPROL-XL) 50 MG 24 hr tablet Take 50 mg by mouth every evening. Take with or immediately following a meal.    Yes [provider]  NOVOLOG FLEXPEN 100 UNIT/ML FlexPen Inject 8 Units into the skin 3 (three) times daily before meals. 11/28/18  Yes [provider]  Omega-3 Fatty Acids (FISH OIL PO) Take 2 capsules by mouth daily.   Yes [provider]  pantoprazole (PROTONIX) 40 MG tablet Take 40 mg by mouth daily. 11/19/18  Yes [provider]  pravastatin (PRAVACHOL) 40 MG tablet Take 40 mg by mouth every evening.    Yes [provider]  cephALEXin (KEFLEX) 500 MG capsule Take 1 capsule (500 mg total) by mouth 2 (two) times daily for 10 days. 12/25/18 01/04/19  Audrey Thull, DO  Dulaglutide (TRULICITY) 0.10 UV/2.5DG SOPN Inject into the skin once a week.    [provider]    Family History Family History  Problem Relation Age of Onset  . Coronary artery disease Father   . Diabetes type II Father   . Heart attack Father     Social History Social History   Tobacco Use  . Smoking status: Never Smoker  . Smokeless tobacco: Never Used  . Tobacco comment: Second hand smoke for 26 years per pt  Substance Use Topics  . Alcohol use: No    Alcohol/week: 0.0 standard drinks  . Drug use: No     Allergies   Ciprofloxacin and Levofloxacin   Review of Systems Review of Systems  Constitutional: Negative for chills and fever.  HENT: Negative for ear pain and sore throat.   Eyes: Negative for pain and visual disturbance.  Respiratory: Negative for cough and shortness of breath.   Cardiovascular: Negative for chest pain and palpitations.  Gastrointestinal: Positive for abdominal pain and nausea. Negative for vomiting.  Genitourinary: Negative for dysuria and  hematuria.  Musculoskeletal: Negative for arthralgias and back pain.  Skin: Negative for color change and rash.  Neurological: Negative for seizures and syncope.  All other systems reviewed and are negative.    Physical Exam Updated Vital Signs  ED Triage Vitals  Enc Vitals Group     BP 12/25/18 1440 (!) 161/102     Pulse Rate 12/25/18 1440 86     Resp 12/25/18 1440 18     Temp 12/25/18 1440 98.2 F (36.8 C)     Temp Source 12/25/18 1440 Oral     SpO2 12/25/18 1440 98 %     Weight --      Height --      Head Circumference --  Peak Flow --      Pain Score 12/25/18 1441 4     Pain Loc --      Pain Edu? --      Excl. in [place]? --     Physical Exam Vitals signs and nursing note reviewed.  Constitutional:      General: He is not in acute distress.    Appearance: He is well-developed. He is not ill-appearing.  HENT:     Head: Normocephalic and atraumatic.  Eyes:     Extraocular Movements: Extraocular movements intact.     Conjunctiva/sclera: Conjunctivae normal.     Pupils: Pupils are equal, round, and reactive to light.  Neck:     Musculoskeletal: Neck supple.  Cardiovascular:     Rate and Rhythm: Normal rate and regular rhythm.     Heart sounds: Normal heart sounds. No murmur.  Pulmonary:     Effort: Pulmonary effort is normal. No respiratory distress.     Breath sounds: Normal breath sounds.  Abdominal:     General: There is distension.     Palpations: Abdomen is soft.     Tenderness: There is abdominal tenderness in the left lower quadrant. There is no right CVA tenderness, left CVA tenderness, guarding or rebound. Negative signs include Murphy's sign and Rovsing's sign.  Skin:    General: Skin is warm and dry.     Capillary Refill: Capillary refill takes less than 2 seconds.  Neurological:     General: No focal deficit present.     Mental Status: He is alert.  Psychiatric:        Mood and Affect: Mood normal.      ED Treatments / Results  Labs (all  labs ordered are listed, but only abnormal results are displayed) Labs Reviewed  CBC WITH DIFFERENTIAL/PLATELET - Abnormal; Notable for the following components:      Result Value   WBC 11.7 (*)    Neutro Abs 8.1 (*)    All other components within normal limits  COMPREHENSIVE METABOLIC PANEL - Abnormal; Notable for the following components:   Potassium 3.4 (*)    Glucose, Bld 185 (*)    AST 65 (*)    ALT 83 (*)    All other components within normal limits  URINALYSIS, ROUTINE W REFLEX MICROSCOPIC - Abnormal; Notable for the following components:   APPearance HAZY (*)    Protein, ur 30 (*)    Leukocytes,Ua SMALL (*)    Bacteria, UA MANY (*)    All other components within normal limits  CBG MONITORING, ED - Abnormal; Notable for the following components:   Glucose-Capillary 139 (*)    All other components within normal limits  URINE CULTURE  URINE CULTURE  SARS CORONAVIRUS 2  LIPASE, BLOOD  HIV ANTIBODY (ROUTINE TESTING W REFLEX)  CBC  BASIC METABOLIC PANEL    EKG None  Radiology Ct Abdomen Pelvis W Contrast  Result Date: 12/25/2018 CLINICAL DATA:  Two-day history of LEFT LOWER QUADRANT abdominal pain. EXAM: CT ABDOMEN AND PELVIS WITH CONTRAST TECHNIQUE: Multidetector CT imaging of the abdomen and pelvis was performed using the standard protocol following bolus administration of intravenous contrast. CONTRAST:  159mL OMNIPAQUE IOHEXOL 300 MG/ML IV. COMPARISON:  Unenhanced CT abdomen and pelvis 04/25/2007. FINDINGS: Lower chest: Heart moderately enlarged. Three-vessel coronary atherosclerosis. Visualized lung bases clear. Hepatobiliary: Severe diffuse hepatic steatosis with focal sparing surrounding the gallbladder. Approximate 9 mm low-attenuation lesion involving the LEFT lobe of the liver. No other focal hepatic parenchymal  abnormalities. Gallbladder normal in appearance without calcified gallstones. No biliary ductal dilation. Pancreas: Normal in appearance without evidence of  mass, ductal dilation, or inflammation. Spleen: Normal in size and appearance. Adrenals/Urinary Tract: Normal appearing adrenal glands. Benign cortical cysts involving both kidneys. Minimal scarring involving the mid RIGHT kidney. Parapelvic renal cysts on the RIGHT. No solid renal masses. No urinary tract calculi. Gas within the urinary bladder. Stomach/Bowel: Normal-appearing decompressed stomach. Normal-appearing small bowel. Moderate colonic stool burden. Scattered diverticula throughout the colon without evidence of acute diverticulitis. Normal appendix in the RIGHT UPPER pelvis. Vascular/Lymphatic: Moderate aorto-iliofemoral atherosclerosis without evidence of aneurysm. Abdominal aorta tortuous. Normal-appearing portal venous and systemic venous systems. No pathologic lymphadenopathy. Reproductive: Marked enlargement of the prostate gland which has mass effect upon the base of the urinary bladder. Normal-appearing seminal vesicles. Other: BILATERAL inguinal hernias containing fat, LEFT larger than RIGHT. Small umbilical hernia containing fat. Musculoskeletal: Degenerative disc disease and spondylosis at L4-5. Facet degenerative changes at L4-5 and L5-S1. No acute findings. IMPRESSION: 1. Gas within the mildly distended urinary bladder. If the patient has not had recent catheterization or other instrumentation, this may be be due to a gas-forming organism. 2. No acute abnormalities otherwise involving the abdomen or pelvis. 3. Diffuse colonic diverticulosis without evidence of acute diverticulitis. 4. Severe diffuse hepatic steatosis. Solitary 9 mm lesion in the LEFT lobe of the liver which is likely benign. 5. Marked prostate gland enlargement. 6. BILATERAL inguinal hernias containing fat, LEFT larger than RIGHT. 7.  Aortic Atherosclerosis (ICD10-170.0) Electronically Signed   By: Evangeline Dakin M.D.   On: 12/25/2018 21:41    Procedures Procedures (including critical care time)  Medications Ordered in  ED Medications  cefTRIAXone (ROCEPHIN) 1 g in sodium chloride 0.9 % 100 mL IVPB (has no administration in time range)  cefTRIAXone (ROCEPHIN) 1 g in sodium chloride 0.9 % 100 mL IVPB (has no administration in time range)  acetaminophen (TYLENOL) tablet 650 mg (has no administration in time range)    Or  acetaminophen (TYLENOL) suppository 650 mg (has no administration in time range)  ondansetron (ZOFRAN) tablet 4 mg (has no administration in time range)    Or  ondansetron (ZOFRAN) injection 4 mg (has no administration in time range)  enoxaparin (LOVENOX) injection 40 mg (has no administration in time range)  sodium chloride 0.9 % bolus 1,000 mL (0 mLs Intravenous Stopped 12/25/18 1808)  fentaNYL (SUBLIMAZE) injection 50 mcg (50 mcg Intravenous Given 12/25/18 1557)  iohexol (OMNIPAQUE) 300 MG/ML solution 100 mL (100 mLs Intravenous Contrast Given 12/25/18 1801)     Initial Impression / Assessment and Plan / ED Course  I have reviewed the triage vital signs and the nursing notes.  Pertinent labs & imaging results that were available during my care of the patient were reviewed by me and considered in my medical decision making (see chart for details).     Donald [name] is 68 year old male with history of diabetes, hypertension, high cholesterol, CKD who presents the ED with abdominal pain.  Patient normal vitals.  No fever.  Patient with left lower quadrant abdominal pain since this morning.  Was sent from primary care doctor to rule out obstruction versus diverticulitis.  Denies any nausea, vomiting.  No diarrhea.  Has a history of diverticulitis.  Patient mostly tender in the left lower quadrant on exam.  States that he had a bowel movement yesterday.  Lab work shows no significant anemia, electrolyte abnormality, kidney injury.  Lipase normal.  Will obtain CT  scan to rule out obstruction/diverticulitis versus perforation.  Will give IV fluids, IV fentanyl for pain.  Patient currently not  nauseous.  Patient with mild leukocytosis.  Urinalysis positive for infection.  CT scan abdomen and pelvis showed possibly gas-forming organism within the bladder.  Patient states that he has noticed some bubbles in his urine when he pees.  Concern for resistant UTI.  Has not had recent instrumentation.  Will start with IV Rocephin and admit him to medicine for further UTI care.  Believe patient would benefit from a culture to find out what organism is causing the symptoms.  Hemodynamically stable.  No concern for sepsis at this time.   This chart was dictated using voice recognition software.  Despite best efforts to proofread,  errors can occur which can change the documentation meaning.    Final Clinical Impressions(s) / ED Diagnoses   Final diagnoses:  Lower urinary tract infectious disease     Lennice Sites, DO 12/25/18 2109    Lennice Sites, DO 12/25/18 2317

## 2018-12-25 NOTE — H&P (Signed)
History and Physical    Donald [name] LOV:[id number] DOB: 02-27-1951 DOA: 12/25/2018  PCP: Prince Solian, MD  Patient coming from: Home  I have personally briefly reviewed patient's old medical records in [place]  Chief Complaint: Abd pain  HPI: Donald [name] is a 68 y.o. male with medical history significant of DM2, OSA.  Patient has been struggling with recurrent UTIs for about the past 1 year.  Was really bad while he was on jardiance he says but has lessened in frequency recently though it continues to occur.  He most recently finished ABx some 2 weeks ago or so.  Today he developed LLQ abd pain which was a new symptom for him (usually gets dysuria).  PCP sent him in to ED for CT scan.  Patient also reports bubbles in urine recently.  Does have h/o diverticulosis on colonoscopy but never had diverticulitis in past.   ED Course: CT reveals no diverticulitis, does reveal fatty liver, also reveals air in bladder.  He has no h/o recent instrumentation / catheter.  UA suggestive of UTI.   Review of Systems: As per HPI, otherwise all review of systems negative.  Past Medical History:  Diagnosis Date   Allergic rhinitis, seasonal    Asthma    Bronchitis    Chronic kidney disease, stage I    collar bone    dislocation left side 05/2014   Degenerative joint disease of knee    bilateral   Diabetes mellitus    GERD (gastroesophageal reflux disease)    Heart murmur    history of   History of acute prostatitis    History of hematuria    Hyperlipidemia    Hypertension    IgA nephropathy    OSA on CPAP    Pneumonia    Sinusitis     Past Surgical History:  Procedure Laterality Date   CARDIAC CATHETERIZATION  09/29/2011   normal   CERVICAL SPINE SURGERY     deviated septum  1930s   HERNIA REPAIR     LEFT HEART CATHETERIZATION WITH CORONARY ANGIOGRAM N/A 09/29/2011   Procedure: LEFT HEART CATHETERIZATION WITH CORONARY ANGIOGRAM;   Surgeon: Sanda Klein, MD;  Location: [facility];  Service: Cardiovascular;  Laterality: N/A;   NM MYOVIEW LTD  07/08/2011   mild LV dilatation,mild septal hypokinesia   TONSILLECTOMY     US ECHOCARDIOGRAPHY  09/07/2011   EF 35-45%,mod.ant hypokinesis,boderline LA & RA enlargement,trace MR,TR & PI     reports that he has never smoked. He has never used smokeless tobacco. He reports that he does not drink alcohol or use drugs.  Allergies  Allergen Reactions   Ciprofloxacin     Shoulder cramping   Levofloxacin     cramping    Family History  Problem Relation Age of Onset   Coronary artery disease Father    Diabetes type II Father    Heart attack Father      Prior to Admission medications   Medication Sig Start Date End Date Taking? Authorizing Provider  albuterol (PROVENTIL HFA;VENTOLIN HFA) 108 (90 BASE) MCG/ACT inhaler Inhale 2 puffs into the lungs every 6 (six) hours as needed. For shortness of breath   Yes [provider]  Ascorbic Acid (VITAMIN C PO) Take 3 tablets by mouth daily.   Yes [provider]  aspirin EC 81 MG tablet Take 81 mg by mouth every evening.    Yes [provider]  CRANBERRY PO Take 1 tablet by  mouth daily.   Yes [provider]  Cyanocobalamin (VITAMIN B12 PO) Take 1 tablet by mouth daily.   Yes [provider]  diltiazem (CARDIZEM CD) 240 MG 24 hr capsule Take 240 mg by mouth daily.   Yes [provider]  DULoxetine HCl 30 MG CSDR Take 30 mg by mouth daily.    Yes [provider]  ibuprofen (ADVIL,MOTRIN) 200 MG tablet Take 200 mg by mouth every 6 (six) hours as needed.   Yes [provider]  Insulin Degludec (TRESIBA [place]) Inject 50 Units into the skin daily.    Yes [provider]  irbesartan-hydrochlorothiazide (AVALIDE) 300-12.5 MG tablet Take 1 tablet by mouth every evening.    Yes [provider]  metFORMIN (GLUCOPHAGE) 1000 MG tablet Take 1,000 mg by  mouth 2 (two) times daily with a meal.   Yes [provider]  metoprolol succinate (TOPROL-XL) 50 MG 24 hr tablet Take 50 mg by mouth every evening. Take with or immediately following a meal.    Yes [provider]  NOVOLOG FLEXPEN 100 UNIT/ML FlexPen Inject 8 Units into the skin 3 (three) times daily before meals. 11/28/18  Yes [provider]  Omega-3 Fatty Acids (FISH OIL PO) Take 2 capsules by mouth daily.   Yes [provider]  pantoprazole (PROTONIX) 40 MG tablet Take 40 mg by mouth daily. 11/19/18  Yes [provider]  pravastatin (PRAVACHOL) 40 MG tablet Take 40 mg by mouth every evening.    Yes [provider]  cephALEXin (KEFLEX) 500 MG capsule Take 1 capsule (500 mg total) by mouth 2 (two) times daily for 10 days. 12/25/18 01/04/19  Curatolo, Adam, DO  Dulaglutide (TRULICITY) 0.76 AU/6.3FH SOPN Inject into the skin once a week.    [provider]    Physical Exam: Vitals:   12/25/18 2145 12/25/18 2245 12/25/18 2300 12/25/18 2315  BP: (!) 156/102 129/83 139/86 (!) 154/107  Pulse: 79  71 75  Resp: 18  19 16   Temp:      TempSrc:      SpO2: 95%  92% 96%    Constitutional: NAD, calm, comfortable Eyes: PERRL, lids and conjunctivae normal ENMT: Mucous membranes are moist. Posterior pharynx clear of any exudate or lesions.Normal dentition.  Neck: normal, supple, no masses, no thyromegaly Respiratory: clear to auscultation bilaterally, no wheezing, no crackles. Normal respiratory effort. No accessory muscle use.  Cardiovascular: Regular rate and rhythm, no murmurs / rubs / gallops. No extremity edema. 2+ pedal pulses. No carotid bruits.  Abdomen: no tenderness, no masses palpated. No hepatosplenomegaly. Bowel sounds positive.  Musculoskeletal: no clubbing / cyanosis. No joint deformity upper and lower extremities. Good ROM, no contractures. Normal muscle tone.  Skin: no rashes, lesions, ulcers. No induration Neurologic: CN  2-12 grossly intact. Sensation intact, DTR normal. Strength 5/5 in all 4.  Psychiatric: Normal judgment and insight. Alert and oriented x 3. Normal mood.    Labs on Admission: I have personally reviewed following labs and imaging studies  CBC: Recent Labs  Lab 12/25/18 1500  WBC 11.7*  NEUTROABS 8.1*  HGB 15.6  HCT 47.5  MCV 89.5  PLT 545   Basic Metabolic Panel: Recent Labs  Lab 12/25/18 1500  NA 139  K 3.4*  CL 98  CO2 26  GLUCOSE 185*  BUN 18  CREATININE 1.01  CALCIUM 9.1   GFR: CrCl cannot be calculated (Unknown ideal weight.). Liver Function Tests: Recent Labs  Lab 12/25/18 1500  AST  65*  ALT 83*  ALKPHOS 44  BILITOT 0.6  PROT 6.6  ALBUMIN 3.7   Recent Labs  Lab 12/25/18 1500  LIPASE 28   No results for input(s): AMMONIA in the last 168 hours. Coagulation Profile: No results for input(s): INR, PROTIME in the last 168 hours. Cardiac Enzymes: No results for input(s): CKTOTAL, CKMB, CKMBINDEX, TROPONINI in the last 168 hours. BNP (last 3 results) No results for input(s): PROBNP in the last 8760 hours. HbA1C: No results for input(s): HGBA1C in the last 72 hours. CBG: Recent Labs  Lab 12/25/18 1619  GLUCAP 139*   Lipid Profile: No results for input(s): CHOL, HDL, LDLCALC, TRIG, CHOLHDL, LDLDIRECT in the last 72 hours. Thyroid Function Tests: No results for input(s): TSH, T4TOTAL, FREET4, T3FREE, THYROIDAB in the last 72 hours. Anemia Panel: No results for input(s): VITAMINB12, FOLATE, FERRITIN, TIBC, IRON, RETICCTPCT in the last 72 hours. Urine analysis:    Component Value Date/Time   COLORURINE YELLOW 12/25/2018 1855   APPEARANCEUR HAZY (A) 12/25/2018 1855   LABSPEC 1.016 12/25/2018 1855   PHURINE 6.0 12/25/2018 1855   GLUCOSEU NEGATIVE 12/25/2018 1855   HGBUR NEGATIVE 12/25/2018 1855   BILIRUBINUR NEGATIVE 12/25/2018 [place] 12/25/2018 1855   PROTEINUR 30 (A) 12/25/2018 1855   NITRITE NEGATIVE 12/25/2018 1855    LEUKOCYTESUR SMALL (A) 12/25/2018 1855    Radiological Exams on Admission: Ct Abdomen Pelvis W Contrast  Result Date: 12/25/2018 CLINICAL DATA:  Two-day history of LEFT LOWER QUADRANT abdominal pain. EXAM: CT ABDOMEN AND PELVIS WITH CONTRAST TECHNIQUE: Multidetector CT imaging of the abdomen and pelvis was performed using the standard protocol following bolus administration of intravenous contrast. CONTRAST:  115mL OMNIPAQUE IOHEXOL 300 MG/ML IV. COMPARISON:  Unenhanced CT abdomen and pelvis 04/25/2007. FINDINGS: Lower chest: Heart moderately enlarged. Three-vessel coronary atherosclerosis. Visualized lung bases clear. Hepatobiliary: Severe diffuse hepatic steatosis with focal sparing surrounding the gallbladder. Approximate 9 mm low-attenuation lesion involving the LEFT lobe of the liver. No other focal hepatic parenchymal abnormalities. Gallbladder normal in appearance without calcified gallstones. No biliary ductal dilation. Pancreas: Normal in appearance without evidence of mass, ductal dilation, or inflammation. Spleen: Normal in size and appearance. Adrenals/Urinary Tract: Normal appearing adrenal glands. Benign cortical cysts involving both kidneys. Minimal scarring involving the mid RIGHT kidney. Parapelvic renal cysts on the RIGHT. No solid renal masses. No urinary tract calculi. Gas within the urinary bladder. Stomach/Bowel: Normal-appearing decompressed stomach. Normal-appearing small bowel. Moderate colonic stool burden. Scattered diverticula throughout the colon without evidence of acute diverticulitis. Normal appendix in the RIGHT UPPER pelvis. Vascular/Lymphatic: Moderate aorto-iliofemoral atherosclerosis without evidence of aneurysm. Abdominal aorta tortuous. Normal-appearing portal venous and systemic venous systems. No pathologic lymphadenopathy. Reproductive: Marked enlargement of the prostate gland which has mass effect upon the base of the urinary bladder. Normal-appearing seminal  vesicles. Other: BILATERAL inguinal hernias containing fat, LEFT larger than RIGHT. Small umbilical hernia containing fat. Musculoskeletal: Degenerative disc disease and spondylosis at L4-5. Facet degenerative changes at L4-5 and L5-S1. No acute findings. IMPRESSION: 1. Gas within the mildly distended urinary bladder. If the patient has not had recent catheterization or other instrumentation, this may be be due to a gas-forming organism. 2. No acute abnormalities otherwise involving the abdomen or pelvis. 3. Diffuse colonic diverticulosis without evidence of acute diverticulitis. 4. Severe diffuse hepatic steatosis. Solitary 9 mm lesion in the LEFT lobe of the liver which is likely benign. 5. Marked prostate gland enlargement. 6. BILATERAL inguinal hernias containing fat, LEFT larger than RIGHT.  7.  Aortic Atherosclerosis (ICD10-170.0) Electronically Signed   By: Evangeline Dakin M.D.   On: 12/25/2018 21:41    EKG: Independently reviewed.  Assessment/Plan Principal Problem:   UTI (urinary tract infection) Active Problems:   DM2 (diabetes mellitus, type 2) (HCC)   HTN (hypertension)   OSA on CPAP   Recurrent UTI    1. UTI - 1. Rocephin 2. UCx pending 3. No mention of colo-vesicular fistula on CT scan or anything 4. Thankfully already scheduled visit with urology in early Sept. 2. OSA on CPAP - 1. Continue CPAP 3. DM2 - 1. Takes Degludec 50u daily and 8 novolog mealtime at home as well as metformin 2. While here: 1. Cont metformin 2. 40 degludic daily 3. 4u novolog mealtime 4. Mod scale SSI 4. HTN - 1. Cont avalide, metoprolol and cardizem  DVT prophylaxis: Lovenox Code Status: Full Family Communication: No family in room Disposition Plan: Home after admit Consults called: None Admission status: Place in 56    Harlene Petralia, [facility]  How to contact the [facility] Attending or Consulting provider Shelly or covering provider during after hours [place], for this  patient?  1. Check the care team in [facility] and look for a) attending/consulting [facility] provider listed and b) the [facility] team listed 2. Log into [email or web address]  Amion Physician Scheduling and messaging for groups and whole hospitals  On call and physician scheduling software for group practices, residents, hospitalists and other medical providers for call, clinic, rotation and shift schedules. On[employer] is a hospital-wide system for scheduling doctors and paging doctors on call. EasyPlot is for scientific plotting and data analysis.  [email or web address]  and use [place]'s universal password to access. If you do not have the password, please contact the hospital operator.  3. Locate the [facility] provider you are looking for under [facility] and page to a number that you can be directly reached. 4. If you still have difficulty reaching the provider, please page the [facility] (Director on Call) for the Hospitalists listed on amion for assistance.  12/25/2018, 11:25 PM

## 2018-12-25 NOTE — ED Notes (Addendum)
Report given to tn on 34m

## 2018-12-26 ENCOUNTER — Encounter (HOSPITAL_COMMUNITY): Payer: Self-pay

## 2018-12-26 ENCOUNTER — Other Ambulatory Visit: Payer: Self-pay

## 2018-12-26 DIAGNOSIS — N3000 Acute cystitis without hematuria: Secondary | ICD-10-CM | POA: Diagnosis not present

## 2018-12-26 DIAGNOSIS — E119 Type 2 diabetes mellitus without complications: Secondary | ICD-10-CM | POA: Diagnosis not present

## 2018-12-26 DIAGNOSIS — N390 Urinary tract infection, site not specified: Secondary | ICD-10-CM | POA: Diagnosis not present

## 2018-12-26 DIAGNOSIS — Z794 Long term (current) use of insulin: Secondary | ICD-10-CM | POA: Diagnosis not present

## 2018-12-26 DIAGNOSIS — I10 Essential (primary) hypertension: Secondary | ICD-10-CM | POA: Diagnosis not present

## 2018-12-26 DIAGNOSIS — G4733 Obstructive sleep apnea (adult) (pediatric): Secondary | ICD-10-CM | POA: Diagnosis not present

## 2018-12-26 DIAGNOSIS — Z9989 Dependence on other enabling machines and devices: Secondary | ICD-10-CM | POA: Diagnosis not present

## 2018-12-26 LAB — BASIC METABOLIC PANEL
Anion gap: 14 (ref 5–15)
BUN: 13 mg/dL (ref 8–23)
CO2: 27 mmol/L (ref 22–32)
Calcium: 8.5 mg/dL — ABNORMAL LOW (ref 8.9–10.3)
Chloride: 98 mmol/L (ref 98–111)
Creatinine, Ser: 0.93 mg/dL (ref 0.61–1.24)
GFR calc Af Amer: >60 mL/min (ref >60)
GFR calc non Af Amer: >60 mL/min (ref >60)
Glucose, Bld: 216 mg/dL — ABNORMAL HIGH (ref 70–99)
Potassium: 3.2 mmol/L — ABNORMAL LOW (ref 3.5–5.1)
Sodium: 139 mmol/L (ref 135–145)

## 2018-12-26 LAB — CBC
HCT: 44.8 % (ref 39.0–52.0)
Hemoglobin: 15.2 g/dL (ref 13.0–17.0)
MCH: 29.6 pg (ref 26.0–34.0)
MCHC: 33.9 g/dL (ref 30.0–36.0)
MCV: 87.3 fL (ref 80.0–100.0)
Platelets: 279 10*3/uL (ref 150–400)
RBC: 5.13 MIL/uL (ref 4.22–5.81)
RDW: 14 % (ref 11.5–15.5)
WBC: 10.8 10*3/uL — ABNORMAL HIGH (ref 4.0–10.5)
nRBC: 0 % (ref 0.0–0.2)

## 2018-12-26 LAB — HEMOGLOBIN A1C
Hgb A1c MFr Bld: 9.2 % — ABNORMAL HIGH (ref 4.8–5.6)
Mean Plasma Glucose: 217.34 mg/dL

## 2018-12-26 LAB — GLUCOSE, CAPILLARY
Glucose-Capillary: 188 mg/dL — ABNORMAL HIGH (ref 70–99)
Glucose-Capillary: 177 mg/dL — ABNORMAL HIGH (ref 70–99)
Glucose-Capillary: 223 mg/dL — ABNORMAL HIGH (ref 70–99)
Glucose-Capillary: 208 mg/dL — ABNORMAL HIGH (ref 70–99)
Glucose-Capillary: 200 mg/dL — ABNORMAL HIGH (ref 70–99)

## 2018-12-26 LAB — SARS CORONAVIRUS 2 BY RT PCR (HOSPITAL ORDER, PERFORMED IN ~~LOC~~ HOSPITAL LAB): SARS Coronavirus 2: NEGATIVE

## 2018-12-26 LAB — MAGNESIUM: Magnesium: 1.4 mg/dL — ABNORMAL LOW (ref 1.7–2.4)

## 2018-12-26 LAB — HIV ANTIBODY (ROUTINE TESTING W REFLEX): HIV Screen 4th Generation wRfx: NONREACTIVE

## 2018-12-26 MED ORDER — ALBUTEROL SULFATE (2.5 MG/3ML) 0.083% IN NEBU: 2.5000 mg | INHALATION_SOLUTION | Freq: Four times a day (QID) | RESPIRATORY_TRACT | Status: DC | PRN

## 2018-12-26 MED ORDER — POTASSIUM CHLORIDE CRYS ER 20 MEQ PO TBCR
40.0000 meq | EXTENDED_RELEASE_TABLET | Freq: Once | ORAL | Status: AC
  Administered 2018-12-26: 40 meq via ORAL
  Filled 2018-12-26: qty 2

## 2018-12-26 MED ORDER — HYDROCHLOROTHIAZIDE 12.5 MG PO CAPS
12.5000 mg | ORAL_CAPSULE | Freq: Every day | ORAL | Status: DC
  Administered 2018-12-26 – 2018-12-27 (×2): 12.5 mg via ORAL
  Filled 2018-12-26: qty 1

## 2018-12-26 MED ORDER — MAGNESIUM SULFATE 4 GM/100ML IV SOLN
4.0000 g | Freq: Once | INTRAVENOUS | Status: AC
  Administered 2018-12-26: 4 g via INTRAVENOUS
  Filled 2018-12-26: qty 100

## 2018-12-26 MED ORDER — IRBESARTAN 300 MG PO TABS
300.0000 mg | ORAL_TABLET | Freq: Every day | ORAL | Status: DC
  Administered 2018-12-26 – 2018-12-27 (×2): 300 mg via ORAL
  Filled 2018-12-26 (×2): qty 1

## 2018-12-26 NOTE — Progress Notes (Signed)
New Admission Note: ? Arrival Method: Wheelchair Mental Orientation: Alert and Oriented X 4 Telemetry: No Assessment: Completed Skin: Refer to flowsheet IV: Left Forearm  Pain: 4 on a scale of 0-10 Tubes: None Safety Measures: Safety Fall Prevention Plan discussed with patient. Admission: Completed 5 [place] Orientation: Patient has been orientated to the room, unit and the staff. Family: None at bedside Orders have been reviewed and are being implemented. Will continue to monitor the patient. Call light has been placed within reach and bed alarm has been activated.  ? Milagros Loll), RN  Phone Number: [phone]

## 2018-12-26 NOTE — Plan of Care (Signed)
  Problem: Education: Goal: Knowledge of General Education information will improve Description Including pain rating scale, medication(s)/side effects and non-pharmacologic comfort measures Outcome: Progressing   

## 2018-12-26 NOTE — Progress Notes (Signed)
PROGRESS NOTE    Donald [name]  ERX:[id number] DOB: 08-02-1950 DOA: 12/25/2018 PCP: Prince Solian, MD   Brief Narrative:  HPI on 12/25/2018 by Dr. Jennette Kettle Donald [name] is a 68 y.o. male with medical history significant of DM2, OSA.  Patient has been struggling with recurrent UTIs for about the past 1 year.  Was really bad while he was on jardiance he says but has lessened in frequency recently though it continues to occur.  He most recently finished ABx some 2 weeks ago or so. Today he developed LLQ abd pain which was a new symptom for him (usually gets dysuria).  PCP sent him in to ED for CT scan. Patient also reports bubbles in urine recently. Does have h/o diverticulosis on colonoscopy but never had diverticulitis in past.  Assessment & Plan   Recurrent UTI with abdominal pain -CT ab/pelvis: Gases on the mildly distended urinary bladder. -UA: Many bacteria, 6-10 WBC, small leukocytes, negative nitrites -Urine culture pending -Continue ceftriaxone -Patient has outpatient appointment with Dr. Karsten Ro, urology  OSA  -Continue CPAP  Diabetes mellitus, type II -Hold metformin -Hemoglobin A1c 9.2 -Continue insulin sliding scale with Lantus, CBG monitoring  Essential hypertension -Continue Cardizem, HCTZ, Avapro, metoprolol  Hypomagnesemia/hypokalemia -replace and continue to monitor   Inguinal hernia -noted on CT scan: L>R -should follow up with general surgery as an outpatient   DVT Prophylaxis Lovenox  Code Status: Full  Family Communication: None at bedside  Disposition Plan: Observation.  Suspect patient will be discharged home within 24 hours pending urine culture results  Consultants None  Procedures  None  Antibiotics   Anti-infectives (From admission, onward)   Start     Dose/Rate Route Frequency Ordered Stop   12/27/18 2200  cefTRIAXone (ROCEPHIN) 1 g in sodium chloride 0.9 % 100 mL IVPB     1 g 200 mL/hr over 30 Minutes Intravenous  Every 24 hours 12/25/18 2255     12/25/18 2200  cefTRIAXone (ROCEPHIN) 1 g in sodium chloride 0.9 % 100 mL IVPB     1 g 200 mL/hr over 30 Minutes Intravenous  Once 12/25/18 2148 12/26/18 0003   12/25/18 0000  cephALEXin (KEFLEX) 500 MG capsule     500 mg Oral 2 times daily 12/25/18 2108 01/04/19 2359      Subjective:   Donald [name] seen and examined today.  Patient continues to have some abdominal pain particularly in the left lower quadrant.  States there is one spot that he can push on that causes him to have pain.  Denies current chest pain, shortness breath, nausea or vomiting, diarrhea constipation, dizziness or headache.  Objective:   Vitals:   12/25/18 2315 12/25/18 2330 12/26/18 0030 12/26/18 0411  BP: (!) 154/107 (!) 152/95 (!) 137/100 (!) 138/97  Pulse: 75 81 79 72  Resp: 16 15 18 18   Temp:   98 F (36.7 C) 98.2 F (36.8 C)  TempSrc:   Oral Oral  SpO2: 96% 91% 97% 97%  Weight:   85.3 kg   Height:   5\' 8"  (1.727 m)     Intake/Output Summary (Last 24 hours) at 12/26/2018 1243 Last data filed at 12/26/2018 0600 Gross per 24 hour  Intake 1000 ml  Output 1850 ml  Net -850 ml   Filed Weights   12/26/18 0030  Weight: 85.3 kg    Exam  General: Well developed, well nourished, NAD, appears stated age  HEENT: NCAT, mucous membranes moist.   Cardiovascular: S1 S2 auscultated,  RRR, no murmur  Respiratory: Clear to auscultation bilaterally   Abdomen: Soft, nontender, nondistended, + bowel sounds  Extremities: warm dry without cyanosis clubbing or edema  Neuro: AAOx3, nonfocal  Psych: Appropriate mood and affect   Data Reviewed: I have personally reviewed following labs and imaging studies  CBC: Recent Labs  Lab 12/25/18 1500 12/26/18 0048  WBC 11.7* 10.8*  NEUTROABS 8.1*  --   HGB 15.6 15.2  HCT 47.5 44.8  MCV 89.5 87.3  PLT 299 956   Basic Metabolic Panel: Recent Labs  Lab 12/25/18 1500 12/26/18 0048  NA 139 139  K 3.4* 3.2*  CL 98 98   CO2 26 27  GLUCOSE 185* 216*  BUN 18 13  CREATININE 1.01 0.93  CALCIUM 9.1 8.5*  MG  --  1.4*   GFR: Estimated Creatinine Clearance: 83.1 mL/min (by C-G formula based on SCr of 0.93 mg/dL). Liver Function Tests: Recent Labs  Lab 12/25/18 1500  AST 65*  ALT 83*  ALKPHOS 44  BILITOT 0.6  PROT 6.6  ALBUMIN 3.7   Recent Labs  Lab 12/25/18 1500  LIPASE 28   No results for input(s): AMMONIA in the last 168 hours. Coagulation Profile: No results for input(s): INR, PROTIME in the last 168 hours. Cardiac Enzymes: No results for input(s): CKTOTAL, CKMB, CKMBINDEX, TROPONINI in the last 168 hours. BNP (last 3 results) No results for input(s): PROBNP in the last 8760 hours. HbA1C: Recent Labs    12/26/18 0048  HGBA1C 9.2*   CBG: Recent Labs  Lab 12/25/18 1619 12/25/18 2349 12/26/18 0026 12/26/18 0720 12/26/18 1206  GLUCAP 139* 104* 177* 188* 223*   Lipid Profile: No results for input(s): CHOL, HDL, LDLCALC, TRIG, CHOLHDL, LDLDIRECT in the last 72 hours. Thyroid Function Tests: No results for input(s): TSH, T4TOTAL, FREET4, T3FREE, THYROIDAB in the last 72 hours. Anemia Panel: No results for input(s): VITAMINB12, FOLATE, FERRITIN, TIBC, IRON, RETICCTPCT in the last 72 hours. Urine analysis:    Component Value Date/Time   COLORURINE YELLOW 12/25/2018 1855   APPEARANCEUR HAZY (A) 12/25/2018 1855   LABSPEC 1.016 12/25/2018 1855   PHURINE 6.0 12/25/2018 1855   GLUCOSEU NEGATIVE 12/25/2018 1855   HGBUR NEGATIVE 12/25/2018 1855   BILIRUBINUR NEGATIVE 12/25/2018 1855   KETONESUR NEGATIVE 12/25/2018 1855   PROTEINUR 30 (A) 12/25/2018 1855   NITRITE NEGATIVE 12/25/2018 1855   LEUKOCYTESUR SMALL (A) 12/25/2018 1855   Sepsis Labs: @LABRCNTIP (procalcitonin:4,lacticidven:4)  ) Recent Results (from the past 240 hour(s))  SARS Coronavirus 2 [facility] order, Performed in [place] hospital lab)     Status: None   Collection Time: 12/25/18 11:30 PM  Result Value Ref  Range Status   SARS Coronavirus 2 NEGATIVE NEGATIVE Final    Comment: (NOTE) If result is NEGATIVE SARS-CoV-2 target nucleic acids are NOT DETECTED. The SARS-CoV-2 RNA is generally detectable in upper and lower  respiratory specimens during the acute phase of infection. The lowest  concentration of SARS-CoV-2 viral copies this assay can detect is 250  copies / mL. A negative result does not preclude SARS-CoV-2 infection  and should not be used as the sole basis for treatment or other  patient management decisions.  A negative result may occur with  improper specimen collection / handling, submission of specimen other  than nasopharyngeal swab, presence of viral mutation(s) within the  areas targeted by this assay, and inadequate number of viral copies  (<250 copies / mL). A negative result must be combined with clinical  observations, patient  history, and epidemiological information. If result is POSITIVE SARS-CoV-2 target nucleic acids are DETECTED. The SARS-CoV-2 RNA is generally detectable in upper and lower  respiratory specimens dur ing the acute phase of infection.  Positive  results are indicative of active infection with SARS-CoV-2.  Clinical  correlation with patient history and other diagnostic information is  necessary to determine patient infection status.  Positive results do  not rule out bacterial infection or co-infection with other viruses. If result is PRESUMPTIVE POSTIVE SARS-CoV-2 nucleic acids MAY BE PRESENT.   A presumptive positive result was obtained on the submitted specimen  and confirmed on repeat testing.  While 2019 novel coronavirus  (SARS-CoV-2) nucleic acids may be present in the submitted sample  additional confirmatory testing may be necessary for epidemiological  and / or clinical management purposes  to differentiate between  SARS-CoV-2 and other Sarbecovirus currently known to infect humans.  If clinically indicated additional testing with an  alternate test  methodology [phone]) is advised. The SARS-CoV-2 RNA is generally  detectable in upper and lower respiratory sp ecimens during the acute  phase of infection. The expected result is Negative. Fact Sheet for Patients:  StrictlyIdeas.no Fact Sheet for Healthcare Providers: BankingDealers.co.za This test is not yet approved or cleared by the [place] FDA and has been authorized for detection and/or diagnosis of SARS-CoV-2 by FDA under an Emergency Use Authorization (EUA).  This EUA will remain in effect (meaning this test can be used) for the duration of the COVID-19 declaration under Section 564(b)(1) of the Act, 21 U.S.C. section 360bbb-3(b)(1), unless the authorization is terminated or revoked sooner. Performed at [facility], [place] [address], [place], [place] [address]       Radiology Studies: Ct Abdomen Pelvis W Contrast  Result Date: 12/25/2018 CLINICAL DATA:  Two-day history of LEFT LOWER QUADRANT abdominal pain. EXAM: CT ABDOMEN AND PELVIS WITH CONTRAST TECHNIQUE: Multidetector CT imaging of the abdomen and pelvis was performed using the standard protocol following bolus administration of intravenous contrast. CONTRAST:  166mL OMNIPAQUE IOHEXOL 300 MG/ML IV. COMPARISON:  Unenhanced CT abdomen and pelvis 04/25/2007. FINDINGS: Lower chest: Heart moderately enlarged. Three-vessel coronary atherosclerosis. Visualized lung bases clear. Hepatobiliary: Severe diffuse hepatic steatosis with focal sparing surrounding the gallbladder. Approximate 9 mm low-attenuation lesion involving the LEFT lobe of the liver. No other focal hepatic parenchymal abnormalities. Gallbladder normal in appearance without calcified gallstones. No biliary ductal dilation. Pancreas: Normal in appearance without evidence of mass, ductal dilation, or inflammation. Spleen: Normal in size and appearance. Adrenals/Urinary Tract: Normal appearing adrenal  glands. Benign cortical cysts involving both kidneys. Minimal scarring involving the mid RIGHT kidney. Parapelvic renal cysts on the RIGHT. No solid renal masses. No urinary tract calculi. Gas within the urinary bladder. Stomach/Bowel: Normal-appearing decompressed stomach. Normal-appearing small bowel. Moderate colonic stool burden. Scattered diverticula throughout the colon without evidence of acute diverticulitis. Normal appendix in the RIGHT UPPER pelvis. Vascular/Lymphatic: Moderate aorto-iliofemoral atherosclerosis without evidence of aneurysm. Abdominal aorta tortuous. Normal-appearing portal venous and systemic venous systems. No pathologic lymphadenopathy. Reproductive: Marked enlargement of the prostate gland which has mass effect upon the base of the urinary bladder. Normal-appearing seminal vesicles. Other: BILATERAL inguinal hernias containing fat, LEFT larger than RIGHT. Small umbilical hernia containing fat. Musculoskeletal: Degenerative disc disease and spondylosis at L4-5. Facet degenerative changes at L4-5 and L5-S1. No acute findings. IMPRESSION: 1. Gas within the mildly distended urinary bladder. If the patient has not had recent catheterization or other instrumentation, this may be be due  to a gas-forming organism. 2. No acute abnormalities otherwise involving the abdomen or pelvis. 3. Diffuse colonic diverticulosis without evidence of acute diverticulitis. 4. Severe diffuse hepatic steatosis. Solitary 9 mm lesion in the LEFT lobe of the liver which is likely benign. 5. Marked prostate gland enlargement. 6. BILATERAL inguinal hernias containing fat, LEFT larger than RIGHT. 7.  Aortic Atherosclerosis (ICD10-170.0) Electronically Signed   By: Evangeline Dakin M.D.   On: 12/25/2018 21:41     Scheduled Meds: . aspirin EC  81 mg Oral QPM  . diltiazem  240 mg Oral Daily  . DULoxetine  30 mg Oral Daily  . enoxaparin (LOVENOX) injection  40 mg Subcutaneous Q24H  . irbesartan  300 mg Oral Daily    And  . hydrochlorothiazide  12.5 mg Oral Daily  . insulin aspart  0-15 Units Subcutaneous TID WC  . insulin aspart  4 Units Subcutaneous TID AC  . insulin glargine  40 Units Subcutaneous Daily  . metoprolol succinate  50 mg Oral QPM  . omega-3 acid ethyl esters  2 g Oral Daily  . pantoprazole  40 mg Oral Daily  . pravastatin  40 mg Oral QPM   Continuous Infusions: . [START ON 12/27/2018] cefTRIAXone (ROCEPHIN)  IV       LOS: 0 days   Time Spent in minutes   30 minutes  Clemence Stillings D.O. on 12/26/2018 at 12:43 PM  Between 7am to 7pm - Please see pager noted on [email or web address]  After 7pm go to [email or web address]  And look for the night coverage person covering for me after hours  [facility] Office  [phone]

## 2018-12-27 DIAGNOSIS — Z9989 Dependence on other enabling machines and devices: Secondary | ICD-10-CM | POA: Diagnosis not present

## 2018-12-27 DIAGNOSIS — G4733 Obstructive sleep apnea (adult) (pediatric): Secondary | ICD-10-CM | POA: Diagnosis not present

## 2018-12-27 DIAGNOSIS — I10 Essential (primary) hypertension: Secondary | ICD-10-CM | POA: Diagnosis not present

## 2018-12-27 DIAGNOSIS — N3000 Acute cystitis without hematuria: Secondary | ICD-10-CM | POA: Diagnosis not present

## 2018-12-27 DIAGNOSIS — E119 Type 2 diabetes mellitus without complications: Secondary | ICD-10-CM | POA: Diagnosis not present

## 2018-12-27 DIAGNOSIS — N390 Urinary tract infection, site not specified: Secondary | ICD-10-CM | POA: Diagnosis not present

## 2018-12-27 DIAGNOSIS — Z794 Long term (current) use of insulin: Secondary | ICD-10-CM | POA: Diagnosis not present

## 2018-12-27 LAB — MAGNESIUM: Magnesium: 2.1 mg/dL (ref 1.7–2.4)

## 2018-12-27 LAB — URINE CULTURE: Culture: >=100000 — AB

## 2018-12-27 LAB — BASIC METABOLIC PANEL
Anion gap: 11 (ref 5–15)
BUN: 10 mg/dL (ref 8–23)
CO2: 31 mmol/L (ref 22–32)
Calcium: 8.2 mg/dL — ABNORMAL LOW (ref 8.9–10.3)
Chloride: 97 mmol/L — ABNORMAL LOW (ref 98–111)
Creatinine, Ser: 0.92 mg/dL (ref 0.61–1.24)
GFR calc Af Amer: >60 mL/min (ref >60)
GFR calc non Af Amer: >60 mL/min (ref >60)
Glucose, Bld: 181 mg/dL — ABNORMAL HIGH (ref 70–99)
Potassium: 3.9 mmol/L (ref 3.5–5.1)
Sodium: 139 mmol/L (ref 135–145)

## 2018-12-27 LAB — GLUCOSE, CAPILLARY
Glucose-Capillary: 178 mg/dL — ABNORMAL HIGH (ref 70–99)
Glucose-Capillary: 276 mg/dL — ABNORMAL HIGH (ref 70–99)

## 2018-12-27 MED ORDER — METFORMIN HCL 1000 MG PO TABS: 1000.0000 mg | ORAL_TABLET | Freq: Two times a day (BID) | ORAL | Status: AC

## 2018-12-27 MED ORDER — AMOXICILLIN-POT CLAVULANATE 875-125 MG PO TABS: 1.0000 | ORAL_TABLET | Freq: Two times a day (BID) | ORAL | 0 refills | Status: AC

## 2018-12-27 MED ORDER — DICLOFENAC SODIUM 1 % TD GEL
2.0000 g | Freq: Four times a day (QID) | TRANSDERMAL | Status: DC
  Administered 2018-12-27: 2 g via TOPICAL
  Filled 2018-12-27: qty 100

## 2018-12-27 NOTE — TOC Initial Note (Signed)
Transition of Care [facility]) - Initial/Assessment Note    Patient Details  Name: Donald [name] MRN: [id number] Date of Birth: 1950-11-09  Transition of Care [place]) CM/SW Contact:    Bartholomew Crews, RN Phone Number: [phone] 12/27/2018, 12:15 PM  Clinical Narrative:                 Spoke with patient at the bedside. PTA home with wife. Has oxygen concentrator that he uses at night for hypoxemia, and a cpap that he uses intermittently. No needs identified. Patient to transition home today.   Expected Discharge Plan: Home/Self Care Barriers to Discharge: No Barriers Identified   Patient Goals and CMS Choice Patient states their goals for this hospitalization and ongoing recovery are:: go home CMS Medicare.gov Compare Post Acute Care list provided to:: Patient Choice offered to / list presented to : NA  Expected Discharge Plan and Services Expected Discharge Plan: Home/Self Care In-house Referral: NA Discharge Planning Services: CM Consult Post Acute Care Choice: NA Living arrangements for the past 2 months: Single Family Home Expected Discharge Date: 12/27/18               DME Arranged: N/A DME Agency: NA       HH Arranged: NA [place] Agency: NA        Prior Living Arrangements/Services Living arrangements for the past 2 months: Single Family Home Lives with:: Self, Spouse Patient language and need for interpreter reviewed:: Yes Do you feel safe going back to the place where you live?: Yes          Current home services: DME Criminal Activity/Legal Involvement Pertinent to Current Situation/Hospitalization: No - Comment as needed  Activities of Daily Living Home Assistive Devices/Equipment: None ADL Screening (condition at time of admission) Patient's cognitive ability adequate to safely complete daily activities?: Yes Is the patient deaf or have difficulty hearing?: No Does the patient have difficulty seeing, even when wearing glasses/contacts?: No Does the patient  have difficulty concentrating, remembering, or making decisions?: No Patient able to express need for assistance with ADLs?: No Does the patient have difficulty dressing or bathing?: No Independently performs ADLs?: Yes (appropriate for developmental age) Does the patient have difficulty walking or climbing stairs?: No Weakness of Legs: None Weakness of Arms/Hands: None  Permission Sought/Granted                  Emotional Assessment Appearance:: Appears younger than stated age, Well-Groomed Attitude/Demeanor/Rapport: Engaged Affect (typically observed): Accepting Orientation: : Oriented to Situation, Oriented to  Time, Oriented to Place, Oriented to Self Alcohol / Substance Use: Not Applicable Psych Involvement: No (comment)  Admission diagnosis:  Lower urinary tract infectious disease [N39.0] Patient Active Problem List   Diagnosis Date Noted  . UTI (urinary tract infection) 12/25/2018  . Recurrent UTI 12/25/2018  . CKD (chronic kidney disease), stage III ([place]) 10/03/2018  . Dyspnea 07/05/2014  . Chronic cough 06/11/2014  . Chronic sinusitis with recurrent bronchitis 06/11/2014  . Hypoxemia requiring supplemental oxygen 06/04/2014  . OSA on CPAP 06/04/2014  . Pneumonitis 06/04/2014  . Hypoxemia 06/13/2013  . Pneumonia, interstitial ([place]) 06/13/2013  . OSA (obstructive sleep apnea) 11/14/2011  . Nocturnal hypoxemia 08/18/2011  . SOB (shortness of breath) 07/06/2011  . DM2 (diabetes mellitus, type 2) ([place]) 07/06/2011  . HTN (hypertension) 07/06/2011  . 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

## 2018-12-27 NOTE — Discharge Summary (Signed)
Physician Discharge Summary  Donald [name] [id number] DOB: 1951-02-10 DOA: 12/25/2018  PCP: Prince Solian, MD  Admit date: 12/25/2018 Discharge date: 12/27/2018  Time spent: 45 minutes  Recommendations for Outpatient Follow-up:  Patient will be discharged to home.  Patient will need to follow up with primary care provider within one week of discharge. Follow up with urology. Patient should continue medications as prescribed.  Patient should follow a heart healthy/carb modified diet.   Discharge Diagnoses:  Recurrent UTI with abdominal pain OSA  Diabetes mellitus, type II Essential hypertension Hypomagnesemia/hypokalemia Inguinal hernia  Discharge Condition: stable  Diet recommendation: heart healthy/carb modified  Filed Weights   12/26/18 0030 12/26/18 2150  Weight: 85.3 kg 85.1 kg    History of present illness: on 12/25/2018 by Dr. Rondall Allegra a 67 y.o.malewith medical history significant ofDM2, OSA. Patient has been struggling with recurrent UTIs for about the past 1 year. Was really bad while he was on jardiance he says but has lessened in frequency recently though it continues to occur. He most recently finished ABx some 2 weeks ago or so. Today he developed LLQ abd pain which was a new symptom for him (usually gets dysuria). PCP sent him in to ED for CT scan. Patient also reports bubbles in urine recently. Does have h/o diverticulosis on colonoscopy but never had diverticulitis in past.  Hospital Course:  Recurrent UTI with abdominal pain -CT ab/pelvis: Gases on the mildly distended urinary bladder. -UA: Many bacteria, 6-10 WBC, small leukocytes, negative nitrites -Urine culture >100K Ecoli, pansensitive -was placed on ceftriaxone -most recently had course of Keflex -will discharge with Augmentin -Patient has outpatient appointment with Dr. Karsten Ro, urology  OSA  -Continue CPAP  Diabetes mellitus, type II -Hold metformin-  may resume on 8/22 -Hemoglobin A1c 9.2 -continue home regimen of insulin on discharge  Essential hypertension -Continue Cardizem, HCTZ, Avapro, metoprolol  Hypomagnesemia/hypokalemia -resolved with replacement  Inguinal hernia -noted on CT scan: L>R -should follow up with general surgery as an outpatient   Procedures: none  Consultations: none  Discharge Exam: Vitals:   12/27/18 0414 12/27/18 0917  BP: (!) 127/91 118/85  Pulse: 74 84  Resp: 18 18  Temp: 98.2 F (36.8 C) 98.5 F (36.9 C)  SpO2: 95% 94%     General: Well developed, well nourished, NAD, appears stated age  HEENT: NCAT, mucous membranes moist.  Cardiovascular: S1 S2 auscultated, RRR, no mrumur  Respiratory: Clear to auscultation bilaterally with equal chest rise  Abdomen: Soft, TTP LLQ,  nondistended, + bowel sounds  Extremities: warm dry without cyanosis clubbing or edema  Neuro: AAOx3, nonfocal  Psych: Normal affect and demeanor with intact judgement and insight  Discharge Instructions Discharge Instructions    Discharge instructions   Complete by: As directed    Patient will be discharged to home.  Patient will need to follow up with primary care provider within one week of discharge. Follow up with urology. Patient should continue medications as prescribed.  Patient should follow a heart healthy/carb modified diet.     Allergies as of 12/27/2018      Reactions   Ciprofloxacin    Shoulder cramping   Levofloxacin    cramping      Medication List    TAKE these medications   albuterol 108 (90 Base) MCG/ACT inhaler Commonly known as: VENTOLIN HFA Inhale 2 puffs into the lungs every 6 (six) hours as needed. For shortness of breath   amoxicillin-clavulanate 875-125 MG tablet Commonly known  as: Augmentin Take 1 tablet by mouth 2 (two) times daily for 5 days.   aspirin EC 81 MG tablet Take 81 mg by mouth every evening.   CRANBERRY PO Take 1 tablet by mouth daily.   diltiazem  240 MG 24 hr capsule Commonly known as: CARDIZEM CD Take 240 mg by mouth daily.   DULoxetine HCl 30 MG Csdr Take 30 mg by mouth daily.   FISH OIL PO Take 2 capsules by mouth daily.   ibuprofen 200 MG tablet Commonly known as: ADVIL Take 200 mg by mouth every 6 (six) hours as needed.   irbesartan-hydrochlorothiazide 300-12.5 MG tablet Commonly known as: AVALIDE Take 1 tablet by mouth every evening.   metFORMIN 1000 MG tablet Commonly known as: GLUCOPHAGE Take 1 tablet (1,000 mg total) by mouth 2 (two) times daily with a meal. Restart on 8/22 What changed: additional instructions   metoprolol succinate 50 MG 24 hr tablet Commonly known as: TOPROL-XL Take 50 mg by mouth every evening. Take with or immediately following a meal.   NovoLOG FlexPen 100 UNIT/ML FlexPen Generic drug: insulin aspart Inject 8 Units into the skin 3 (three) times daily before meals.   pantoprazole 40 MG tablet Commonly known as: PROTONIX Take 40 mg by mouth daily.   pravastatin 40 MG tablet Commonly known as: PRAVACHOL Take 40 mg by mouth every evening.   TRESIBA [place] Inject 50 Units into the skin daily.   VITAMIN B12 PO Take 1 tablet by mouth daily.   VITAMIN C PO Take 3 tablets by mouth daily.      Allergies  Allergen Reactions   Ciprofloxacin     Shoulder cramping   Levofloxacin     cramping   Follow-up Information    Avva, Ravisankar, MD. Schedule an appointment as soon as possible for a visit in 2 days.   Specialty: Internal Medicine Contact information: [place] [address] [address]   Specialty: Emergency Medicine Why: If symptoms worsen, As needed Contact information: [id number] [place]       Kathie Rhodes, MD. Go to.   Specialty: Urology Contact information: [place] [address] [phone]              The results of significant diagnostics from this hospitalization (including imaging, microbiology, ancillary and laboratory) are listed below for reference.    Significant Diagnostic Studies: Ct Abdomen Pelvis W Contrast  Result Date: 12/25/2018 CLINICAL DATA:  Two-day history of LEFT LOWER QUADRANT abdominal pain. EXAM: CT ABDOMEN AND PELVIS WITH CONTRAST TECHNIQUE: Multidetector CT imaging of the abdomen and pelvis was performed using the standard protocol following bolus administration of intravenous contrast. CONTRAST:  160mL OMNIPAQUE IOHEXOL 300 MG/ML IV. COMPARISON:  Unenhanced CT abdomen and pelvis 04/25/2007. FINDINGS: Lower chest: Heart moderately enlarged. Three-vessel coronary atherosclerosis. Visualized lung bases clear. Hepatobiliary: Severe diffuse hepatic steatosis with focal sparing surrounding the gallbladder. Approximate 9 mm low-attenuation lesion involving the LEFT lobe of the liver. No other focal hepatic parenchymal abnormalities. Gallbladder normal in appearance without calcified gallstones. No biliary ductal dilation. Pancreas: Normal in appearance without evidence of mass, ductal dilation, or inflammation. Spleen: Normal in size and appearance. Adrenals/Urinary Tract: Normal appearing adrenal glands. Benign cortical cysts involving both kidneys. Minimal scarring involving the mid RIGHT kidney. Parapelvic renal cysts on the RIGHT. No solid renal masses. No urinary tract calculi. Gas within  the urinary bladder. Stomach/Bowel: Normal-appearing decompressed stomach. Normal-appearing small bowel. Moderate colonic stool burden. Scattered diverticula throughout the colon without evidence of acute diverticulitis. Normal appendix in the RIGHT UPPER pelvis. Vascular/Lymphatic: Moderate aorto-iliofemoral atherosclerosis without evidence of aneurysm. Abdominal aorta tortuous. Normal-appearing portal venous and systemic venous systems. No pathologic lymphadenopathy. Reproductive: Marked  enlargement of the prostate gland which has mass effect upon the base of the urinary bladder. Normal-appearing seminal vesicles. Other: BILATERAL inguinal hernias containing fat, LEFT larger than RIGHT. Small umbilical hernia containing fat. Musculoskeletal: Degenerative disc disease and spondylosis at L4-5. Facet degenerative changes at L4-5 and L5-S1. No acute findings. IMPRESSION: 1. Gas within the mildly distended urinary bladder. If the patient has not had recent catheterization or other instrumentation, this may be be due to a gas-forming organism. 2. No acute abnormalities otherwise involving the abdomen or pelvis. 3. Diffuse colonic diverticulosis without evidence of acute diverticulitis. 4. Severe diffuse hepatic steatosis. Solitary 9 mm lesion in the LEFT lobe of the liver which is likely benign. 5. Marked prostate gland enlargement. 6. BILATERAL inguinal hernias containing fat, LEFT larger than RIGHT. 7.  Aortic Atherosclerosis (ICD10-170.0) Electronically Signed   By: Evangeline Dakin M.D.   On: 12/25/2018 21:41    Microbiology: Recent Results (from the past 240 hour(s))  Urine culture     Status: Abnormal   Collection Time: 12/25/18 10:06 PM   Specimen: Urine, Random  Result Value Ref Range Status   Specimen Description URINE, RANDOM  Final   Special Requests   Final    NONE Performed at [facility], [address], [place], [place] [address]    Culture >=100,000 COLONIES/mL ESCHERICHIA COLI (A)  Final   Report Status 12/27/2018 FINAL  Final   Organism ID, Bacteria ESCHERICHIA COLI (A)  Final      Susceptibility   Escherichia coli - MIC*    AMPICILLIN 4 SENSITIVE Sensitive     CEFAZOLIN <=4 SENSITIVE Sensitive     CEFTRIAXONE <=1 SENSITIVE Sensitive     CIPROFLOXACIN <=0.25 SENSITIVE Sensitive     GENTAMICIN <=1 SENSITIVE Sensitive     IMIPENEM <=0.25 SENSITIVE Sensitive     NITROFURANTOIN 32 SENSITIVE Sensitive     TRIMETH/SULFA <=20 SENSITIVE Sensitive      AMPICILLIN/SULBACTAM <=2 SENSITIVE Sensitive     PIP/TAZO <=4 SENSITIVE Sensitive     Extended ESBL NEGATIVE Sensitive     * >=100,000 COLONIES/mL ESCHERICHIA COLI  SARS Coronavirus 2 [facility] order, Performed in [place] [facility] lab)     Status: None   Collection Time: 12/25/18 11:30 PM  Result Value Ref Range Status   SARS Coronavirus 2 NEGATIVE NEGATIVE Final    Comment: (NOTE) If result is NEGATIVE SARS-CoV-2 target nucleic acids are NOT DETECTED. The SARS-CoV-2 RNA is generally detectable in upper and lower  respiratory specimens during the acute phase of infection. The lowest  concentration of SARS-CoV-2 viral copies this assay can detect is 250  copies / mL. A negative result does not preclude SARS-CoV-2 infection  and should not be used as the sole basis for treatment or other  patient management decisions.  A negative result may occur with  improper specimen collection / handling, submission of specimen other  than nasopharyngeal swab, presence of viral mutation(s) within the  areas targeted by this assay, and inadequate number of viral copies  (<250 copies / mL). A negative result must be combined with clinical  observations, patient history, and epidemiological information. If result is POSITIVE SARS-CoV-2 target  nucleic acids are DETECTED. The SARS-CoV-2 RNA is generally detectable in upper and lower  respiratory specimens dur ing the acute phase of infection.  Positive  results are indicative of active infection with SARS-CoV-2.  Clinical  correlation with patient history and other diagnostic information is  necessary to determine patient infection status.  Positive results do  not rule out bacterial infection or co-infection with other viruses. If result is PRESUMPTIVE POSTIVE SARS-CoV-2 nucleic acids MAY BE PRESENT.   A presumptive positive result was obtained on the submitted specimen  and confirmed on repeat testing.  While 2019 novel coronavirus  (SARS-CoV-2)  nucleic acids may be present in the submitted sample  additional confirmatory testing may be necessary for epidemiological  and / or clinical management purposes  to differentiate between  SARS-CoV-2 and other Sarbecovirus currently known to infect humans.  If clinically indicated additional testing with an alternate test  methodology [phone]) is advised. The SARS-CoV-2 RNA is generally  detectable in upper and lower respiratory sp ecimens during the acute  phase of infection. The expected result is Negative. Fact Sheet for Patients:  StrictlyIdeas.no Fact Sheet for Healthcare Providers: BankingDealers.co.za This test is not yet approved or cleared by the [place] FDA and has been authorized for detection and/or diagnosis of SARS-CoV-2 by FDA under an Emergency Use Authorization (EUA).  This EUA will remain in effect (meaning this test can be used) for the duration of the COVID-19 declaration under Section 564(b)(1) of the Act, 21 U.S.C. section 360bbb-3(b)(1), unless the authorization is terminated or revoked sooner. Performed at [facility], [place] [address] [place], [place] [address]   Culture, Urine     Status: Abnormal   Collection Time: 12/26/18  7:36 AM   Specimen: Urine, Random  Result Value Ref Range Status   Specimen Description URINE, RANDOM  Final   Special Requests   Final    NONE Performed at [facility], [place] [address] [place], [place] [address]    Culture MULTIPLE SPECIES PRESENT, SUGGEST RECOLLECTION (A)  Final   Report Status 12/27/2018 FINAL  Final     Labs: Basic Metabolic Panel: Recent Labs  Lab 12/25/18 1500 12/26/18 0048 12/27/18 0653  NA 139 139 139  K 3.4* 3.2* 3.9  CL 98 98 97*  CO2 26 27 31   GLUCOSE 185* 216* 181*  BUN 18 13 10   CREATININE 1.01 0.93 0.92  CALCIUM 9.1 8.5* 8.2*  MG  --  1.4* 2.1   Liver Function Tests: Recent Labs  Lab 12/25/18 1500  AST 65*  ALT 83*    ALKPHOS 44  BILITOT 0.6  PROT 6.6  ALBUMIN 3.7   Recent Labs  Lab 12/25/18 1500  LIPASE 28   No results for input(s): AMMONIA in the last 168 hours. CBC: Recent Labs  Lab 12/25/18 1500 12/26/18 0048  WBC 11.7* 10.8*  NEUTROABS 8.1*  --   HGB 15.6 15.2  HCT 47.5 44.8  MCV 89.5 87.3  PLT 299 279   Cardiac Enzymes: No results for input(s): CKTOTAL, CKMB, CKMBINDEX, TROPONINI in the last 168 hours. BNP: BNP (last 3 results) No results for input(s): BNP in the last 8760 hours.  ProBNP (last 3 results) No results for input(s): PROBNP in the last 8760 hours.  CBG: Recent Labs  Lab 12/26/18 0720 12/26/18 1206 12/26/18 1644 12/26/18 2147 12/27/18 0655  GLUCAP 188* 223* 208* 200* 178*       Signed:  Mariadejesus Cade  [facility] 12/27/2018, 10:22  AM

## 2018-12-27 NOTE — Progress Notes (Signed)
Donald [name] to be discharged home per MD order. Discussed prescriptions and follow up appointments with the patient. Prescriptions given to patient; medication list explained in detail. Patient verbalized understanding.  Skin clean, dry and intact without evidence of skin break down, no evidence of skin tears noted. IV catheter discontinued intact. Site without signs and symptoms of complications. Dressing and pressure applied. Pt denies pain at the site currently. No complaints noted.  Patient free of lines, drains, and wounds.   An After Visit Summary (AVS) was printed and given to the patient. Patient escorted via wheelchair, and discharged home via private auto.  Baldo Ash, RN

## 2018-12-27 NOTE — Progress Notes (Signed)
Inpatient Diabetes Program Recommendations  AACE/ADA: New Consensus Statement on Inpatient Glycemic Control (2015)  Target Ranges:  Prepandial:   less than 140 mg/dL      Peak postprandial:   less than 180 mg/dL (1-2 hours)      Critically ill patients:  140 - 180 mg/dL   Lab Results  Component Value Date   GLUCAP 178 (H) 12/27/2018   HGBA1C 9.2 (H) 12/26/2018    Review of Glycemic Control Results for LEANNE, BUCKERT (MRN [id number]) as of 12/27/2018 09:19  Ref. Range 12/26/2018 00:26 12/26/2018 07:20 12/26/2018 12:06 12/26/2018 16:44 12/26/2018 21:47 12/27/2018 06:55  Glucose-Capillary Latest Ref Range: 70 - 99 mg/dL 177 (H) 188 (H) 223 (H) 208 (H) 200 (H) 178 (H)   Diabetes history: DM 2 Outpatient Diabetes medications:  Tresiba 50 units daily, Metformin 1000 mg bid, Novolog 8 units tid with meals  Current orders for Inpatient glycemic control:  Novolog moderate tid with meals Novolog 4 units tid with meals  Lantus 40 units daily  Inpatient Diabetes Program Recommendations:   Please consider increasing Lantus to 45 units daily.   Also consider increasing Novolog to 6 units tid with meals.  Thanks,  Adah Perl, RN, BC-ADM Inpatient Diabetes Coordinator Pager [phone] (8a-5p)

## 2018-12-27 NOTE — Plan of Care (Signed)
  Problem: Education: Goal: Knowledge of General Education information will improve Description: Including pain rating scale, medication(s)/side effects and non-pharmacologic comfort measures Outcome: Adequate for Discharge   Problem: Health Behavior/Discharge Planning: Goal: Ability to manage health-related needs will improve Outcome: Adequate for Discharge   Problem: Clinical Measurements: Goal: Ability to maintain clinical measurements within normal limits will improve Outcome: Adequate for Discharge Goal: Will remain free from infection Outcome: Adequate for Discharge Goal: Diagnostic test results will improve Outcome: Adequate for Discharge Goal: Respiratory complications will improve Outcome: Adequate for Discharge Goal: Cardiovascular complication will be avoided Outcome: Adequate for Discharge   Problem: Activity: Goal: Risk for activity intolerance will decrease Outcome: Adequate for Discharge   Problem: Nutrition: Goal: Adequate nutrition will be maintained Outcome: Adequate for Discharge   Problem: Coping: Goal: Level of anxiety will decrease Outcome: Adequate for Discharge   Problem: Elimination: Goal: Will not experience complications related to bowel motility Outcome: Adequate for Discharge Goal: Will not experience complications related to urinary retention Outcome: Adequate for Discharge   Problem: Pain Managment: Goal: General experience of comfort will improve Outcome: Adequate for Discharge   Problem: Safety: Goal: Ability to remain free from injury will improve 12/27/2018 1139 by Baldo Ash, RN Outcome: Adequate for Discharge 12/27/2018 1055 by Baldo Ash, RN Outcome: Progressing   Problem: Skin Integrity: Goal: Risk for impaired skin integrity will decrease Outcome: Adequate for Discharge

## 2018-12-27 NOTE — Care Management Obs Status (Signed)
[place]   Patient Details  Name: Donald [name] MRN: [id number] Date of Birth: 1950-12-18   Medicare Observation Status Notification Given:  Yes    Bartholomew Crews, RN 12/27/2018, 10:08 AM

## 2018-12-27 NOTE — Plan of Care (Signed)
  Problem: Safety: Goal: Ability to remain free from injury will improve Outcome: Progressing   

## 2019-01-01 DIAGNOSIS — Z1159 Encounter for screening for other viral diseases: Secondary | ICD-10-CM | POA: Diagnosis not present

## 2019-01-03 DIAGNOSIS — K4090 Unilateral inguinal hernia, without obstruction or gangrene, not specified as recurrent: Secondary | ICD-10-CM | POA: Diagnosis not present

## 2019-01-03 DIAGNOSIS — G4733 Obstructive sleep apnea (adult) (pediatric): Secondary | ICD-10-CM | POA: Diagnosis not present

## 2019-01-03 DIAGNOSIS — R1032 Left lower quadrant pain: Secondary | ICD-10-CM | POA: Diagnosis not present

## 2019-01-03 DIAGNOSIS — N390 Urinary tract infection, site not specified: Secondary | ICD-10-CM | POA: Diagnosis not present

## 2019-01-03 DIAGNOSIS — E118 Type 2 diabetes mellitus with unspecified complications: Secondary | ICD-10-CM | POA: Diagnosis not present

## 2019-01-03 DIAGNOSIS — K219 Gastro-esophageal reflux disease without esophagitis: Secondary | ICD-10-CM | POA: Diagnosis not present

## 2019-01-03 DIAGNOSIS — Z86010 Personal history of colonic polyps: Secondary | ICD-10-CM | POA: Diagnosis not present

## 2019-01-03 DIAGNOSIS — I129 Hypertensive chronic kidney disease with stage 1 through stage 4 chronic kidney disease, or unspecified chronic kidney disease: Secondary | ICD-10-CM | POA: Diagnosis not present

## 2019-01-03 DIAGNOSIS — N183 Chronic kidney disease, stage 3 (moderate): Secondary | ICD-10-CM | POA: Diagnosis not present

## 2019-01-06 DIAGNOSIS — R05 Cough: Secondary | ICD-10-CM | POA: Diagnosis not present

## 2019-01-06 DIAGNOSIS — Z86010 Personal history of colonic polyps: Secondary | ICD-10-CM | POA: Diagnosis not present

## 2019-01-06 DIAGNOSIS — D123 Benign neoplasm of transverse colon: Secondary | ICD-10-CM | POA: Diagnosis not present

## 2019-01-06 DIAGNOSIS — K635 Polyp of colon: Secondary | ICD-10-CM | POA: Diagnosis not present

## 2019-01-06 DIAGNOSIS — K449 Diaphragmatic hernia without obstruction or gangrene: Secondary | ICD-10-CM | POA: Diagnosis not present

## 2019-01-06 DIAGNOSIS — K317 Polyp of stomach and duodenum: Secondary | ICD-10-CM | POA: Diagnosis not present

## 2019-01-09 DIAGNOSIS — D123 Benign neoplasm of transverse colon: Secondary | ICD-10-CM | POA: Diagnosis not present

## 2019-01-09 DIAGNOSIS — N3021 Other chronic cystitis with hematuria: Secondary | ICD-10-CM | POA: Diagnosis not present

## 2019-01-09 DIAGNOSIS — R8279 Other abnormal findings on microbiological examination of urine: Secondary | ICD-10-CM | POA: Diagnosis not present

## 2019-01-09 DIAGNOSIS — R310 Gross hematuria: Secondary | ICD-10-CM | POA: Diagnosis not present

## 2019-01-09 DIAGNOSIS — K317 Polyp of stomach and duodenum: Secondary | ICD-10-CM | POA: Diagnosis not present

## 2019-01-09 DIAGNOSIS — K635 Polyp of colon: Secondary | ICD-10-CM | POA: Diagnosis not present

## 2019-01-24 DIAGNOSIS — K219 Gastro-esophageal reflux disease without esophagitis: Secondary | ICD-10-CM | POA: Diagnosis not present

## 2019-01-24 DIAGNOSIS — N390 Urinary tract infection, site not specified: Secondary | ICD-10-CM | POA: Diagnosis not present

## 2019-01-24 DIAGNOSIS — I129 Hypertensive chronic kidney disease with stage 1 through stage 4 chronic kidney disease, or unspecified chronic kidney disease: Secondary | ICD-10-CM | POA: Diagnosis not present

## 2019-01-24 DIAGNOSIS — R748 Abnormal levels of other serum enzymes: Secondary | ICD-10-CM | POA: Diagnosis not present

## 2019-01-24 DIAGNOSIS — R1032 Left lower quadrant pain: Secondary | ICD-10-CM | POA: Diagnosis not present

## 2019-01-24 DIAGNOSIS — N183 Chronic kidney disease, stage 3 (moderate): Secondary | ICD-10-CM | POA: Diagnosis not present

## 2019-01-24 DIAGNOSIS — E118 Type 2 diabetes mellitus with unspecified complications: Secondary | ICD-10-CM | POA: Diagnosis not present

## 2019-01-24 DIAGNOSIS — Z86010 Personal history of colonic polyps: Secondary | ICD-10-CM | POA: Diagnosis not present

## 2019-01-24 DIAGNOSIS — K4090 Unilateral inguinal hernia, without obstruction or gangrene, not specified as recurrent: Secondary | ICD-10-CM | POA: Diagnosis not present

## 2019-02-14 IMAGING — CR DG CHEST 2V
2 series · 2 of 2 positions shown · non-contrast
Comparison: Radiographs July 03, 2014.

CLINICAL DATA: Cough, fever.

EXAM:
CHEST  2 VIEW

[w chest pa]
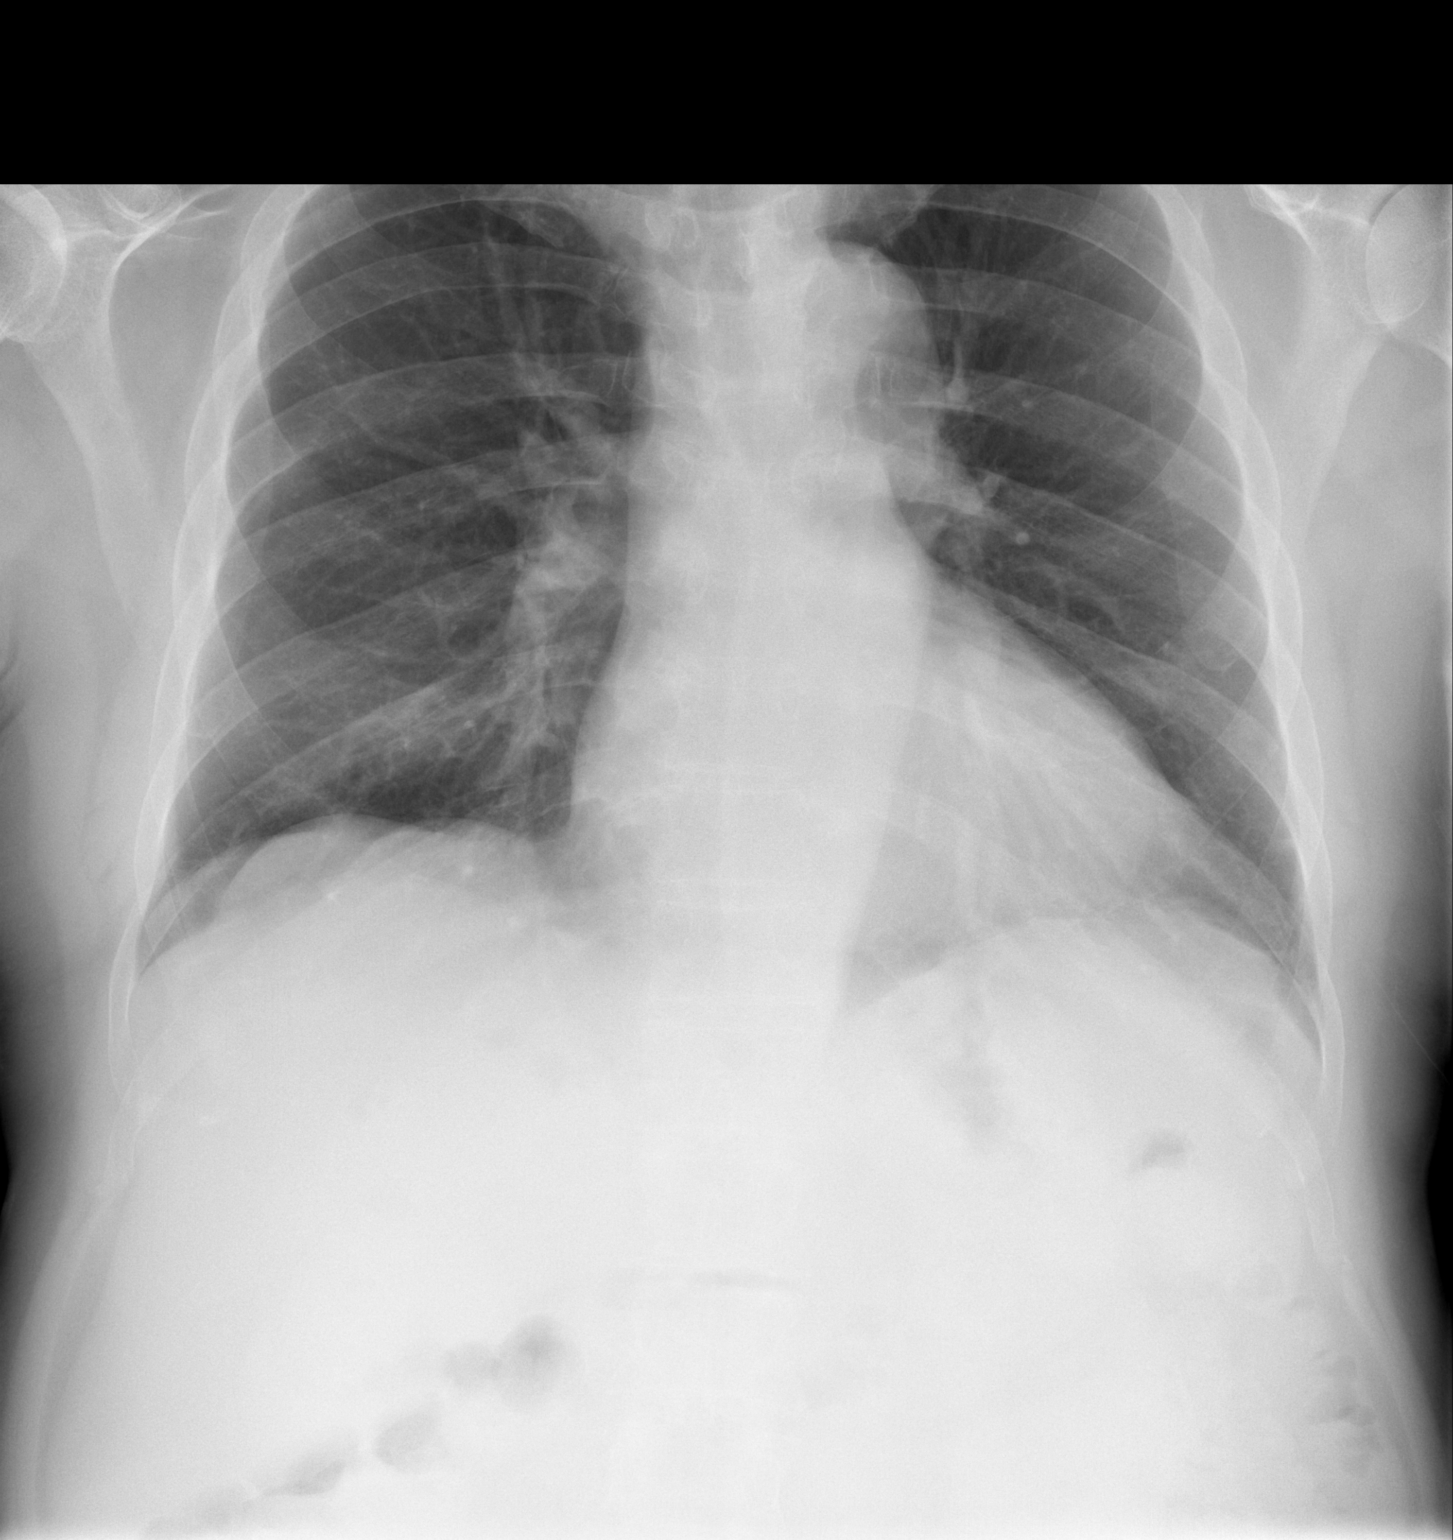

[w chest lat]
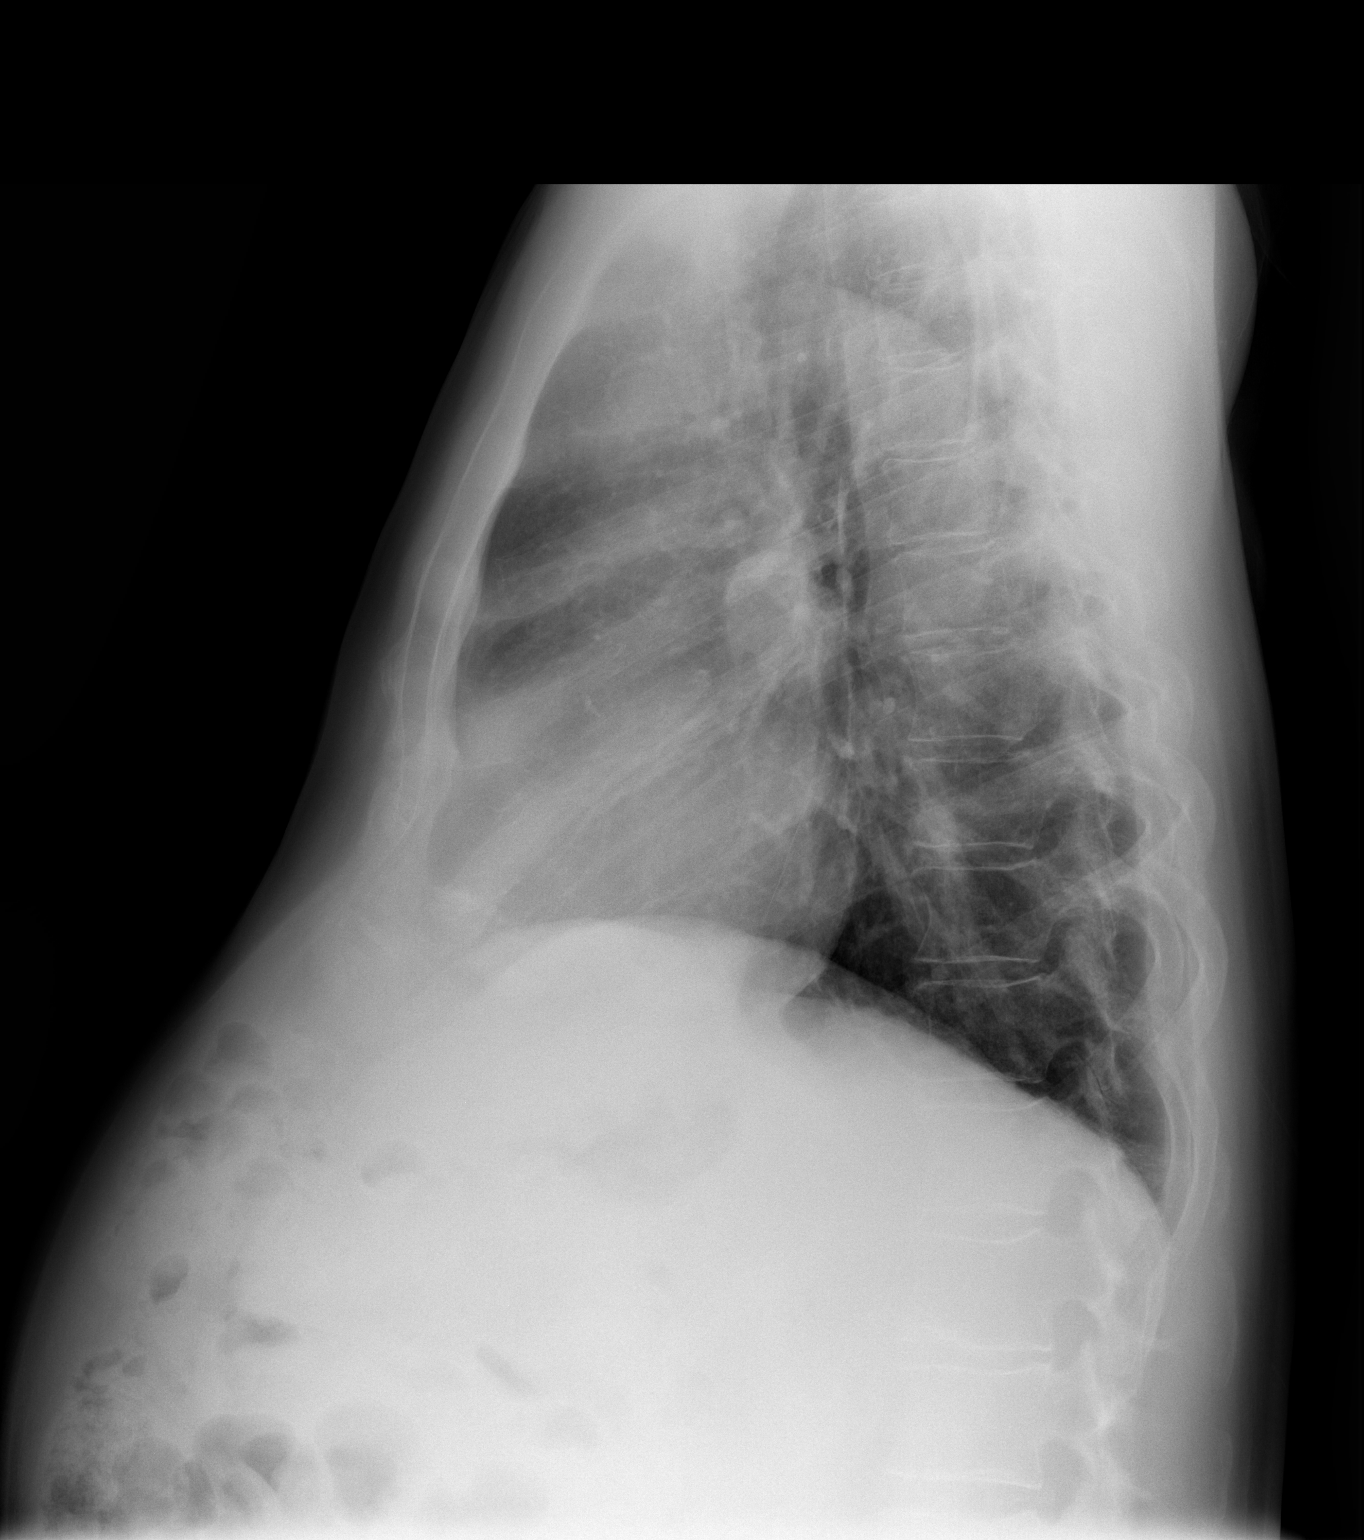

[2 of 2 positions shown; findings below may reference images not displayed]

FINDINGS: The heart size and mediastinal contours are within normal limits.
Both lungs are clear. No pneumothorax or pleural effusion is noted.
The visualized skeletal structures are unremarkable.
IMPRESSION: No active cardiopulmonary disease.

## 2019-02-18 DIAGNOSIS — N3021 Other chronic cystitis with hematuria: Secondary | ICD-10-CM | POA: Diagnosis not present

## 2019-02-18 DIAGNOSIS — R310 Gross hematuria: Secondary | ICD-10-CM | POA: Diagnosis not present

## 2019-03-06 DIAGNOSIS — E118 Type 2 diabetes mellitus with unspecified complications: Secondary | ICD-10-CM | POA: Diagnosis not present

## 2019-03-06 DIAGNOSIS — D72829 Elevated white blood cell count, unspecified: Secondary | ICD-10-CM | POA: Diagnosis not present

## 2019-03-06 DIAGNOSIS — N183 Chronic kidney disease, stage 3 unspecified: Secondary | ICD-10-CM | POA: Diagnosis not present

## 2019-03-06 DIAGNOSIS — Z794 Long term (current) use of insulin: Secondary | ICD-10-CM | POA: Diagnosis not present

## 2019-03-06 DIAGNOSIS — I129 Hypertensive chronic kidney disease with stage 1 through stage 4 chronic kidney disease, or unspecified chronic kidney disease: Secondary | ICD-10-CM | POA: Diagnosis not present

## 2019-03-06 DIAGNOSIS — Z23 Encounter for immunization: Secondary | ICD-10-CM | POA: Diagnosis not present

## 2019-04-02 ENCOUNTER — Other Ambulatory Visit: Payer: Self-pay

## 2019-04-02 ENCOUNTER — Encounter (HOSPITAL_BASED_OUTPATIENT_CLINIC_OR_DEPARTMENT_OTHER): Payer: Self-pay

## 2019-04-02 ENCOUNTER — Emergency Department (HOSPITAL_BASED_OUTPATIENT_CLINIC_OR_DEPARTMENT_OTHER): Payer: Medicare HMO

## 2019-04-02 ENCOUNTER — Emergency Department (HOSPITAL_BASED_OUTPATIENT_CLINIC_OR_DEPARTMENT_OTHER)
Admission: EM | Admit: 2019-04-02 | Discharge: 2019-04-03 | Disposition: A | Discharge status: Home / Self Care | Payer: Medicare HMO | Attending: Emergency Medicine | Admitting: Emergency Medicine

## 2019-04-02 DIAGNOSIS — Z7982 Long term (current) use of aspirin: Secondary | ICD-10-CM | POA: Diagnosis not present

## 2019-04-02 DIAGNOSIS — Z79899 Other long term (current) drug therapy: Secondary | ICD-10-CM | POA: Diagnosis not present

## 2019-04-02 DIAGNOSIS — J9801 Acute bronchospasm: Secondary | ICD-10-CM | POA: Diagnosis not present

## 2019-04-02 DIAGNOSIS — R05 Cough: Secondary | ICD-10-CM | POA: Diagnosis not present

## 2019-04-02 DIAGNOSIS — I129 Hypertensive chronic kidney disease with stage 1 through stage 4 chronic kidney disease, or unspecified chronic kidney disease: Secondary | ICD-10-CM | POA: Insufficient documentation

## 2019-04-02 DIAGNOSIS — Z794 Long term (current) use of insulin: Secondary | ICD-10-CM | POA: Insufficient documentation

## 2019-04-02 DIAGNOSIS — R0989 Other specified symptoms and signs involving the circulatory and respiratory systems: Secondary | ICD-10-CM | POA: Diagnosis not present

## 2019-04-02 DIAGNOSIS — E1122 Type 2 diabetes mellitus with diabetic chronic kidney disease: Secondary | ICD-10-CM | POA: Diagnosis not present

## 2019-04-02 DIAGNOSIS — N181 Chronic kidney disease, stage 1: Secondary | ICD-10-CM | POA: Insufficient documentation

## 2019-04-02 MED ORDER — FAMOTIDINE IN NACL 20-0.9 MG/50ML-% IV SOLN
20.0000 mg | Freq: Once | INTRAVENOUS | Status: AC
  Administered 2019-04-02: 20 mg via INTRAVENOUS
  Filled 2019-04-02: qty 50

## 2019-04-02 MED ORDER — ALBUTEROL SULFATE HFA 108 (90 BASE) MCG/ACT IN AERS
2.0000 | INHALATION_SPRAY | RESPIRATORY_TRACT | Status: DC | PRN
  Administered 2019-04-02: 2 via RESPIRATORY_TRACT
  Filled 2019-04-02: qty 6.7

## 2019-04-02 NOTE — ED Triage Notes (Addendum)
Pt states he aspirated acid reflux fluid~1 hour PTA-states since then he is having CP,coughing and SOB-denies recent fever/flu like sx-states he felt fine prior to event-NAD-slow gait-taken to tx area via w/c

## 2019-04-02 NOTE — ED Provider Notes (Signed)
[name] Provider Note: Donald Spurling, MD, FACEP  CSN: [id number] MRN: [id number] ARRIVAL: 04/02/19 at 2148 ROOM: [place]  [facility]   HISTORY OF PRESENT ILLNESS  04/02/19 11:31 PM Donald [name] is a 68 y.o. male with a history of asthma and GERD.  He is here complaining of aspirating what he describes as acid reflux fluid about an hour prior to arrival while lying on his bed petting his dog.  Since then he has been having throat pain, which she rates as a 5 out of 10, has been coughing and has had shortness of breath.  He feels tightness in his chest.  He does not have an albuterol inhaler.  He denies symptoms before this event.  He has not had a recent flulike symptoms.    Past Medical History:  Diagnosis Date  . Allergic rhinitis, seasonal   . Asthma   . Bronchitis   . Chronic kidney disease, stage I   . collar bone    dislocation left side 05/2014  . Degenerative joint disease of knee    bilateral  . Diabetes mellitus   . GERD (gastroesophageal reflux disease)   . Heart murmur    history of  . History of acute prostatitis   . History of hematuria   . Hyperlipidemia   . Hypertension   . IgA nephropathy   . OSA on CPAP   . Pneumonia   . Sinusitis     Past Surgical History:  Procedure Laterality Date  . CARDIAC CATHETERIZATION  09/29/2011   normal  . CERVICAL SPINE SURGERY    . deviated septum  1930s  . HERNIA REPAIR    . LEFT HEART CATHETERIZATION WITH CORONARY ANGIOGRAM N/A 09/29/2011   Procedure: LEFT HEART CATHETERIZATION WITH CORONARY ANGIOGRAM;  Surgeon: Sanda Klein, MD;  Location: [facility] CATH [facility];  Service: Cardiovascular;  Laterality: N/A;  . NM MYOVIEW LTD  07/08/2011   mild LV dilatation,mild septal hypokinesia  . TONSILLECTOMY    . US ECHOCARDIOGRAPHY  09/07/2011   EF 35-45%,mod.ant hypokinesis,boderline LA & RA enlargement,trace MR,TR & PI    Family History  Problem Relation Age of Onset  . Coronary artery  disease Father   . Diabetes type II Father   . Heart attack Father     Social History   Tobacco Use  . Smoking status: Never Smoker  . Smokeless tobacco: Never Used  . Tobacco comment: Second hand smoke for 26 years per pt  Substance Use Topics  . Alcohol use: No    Alcohol/week: 0.0 standard drinks  . Drug use: No    Prior to Admission medications   Medication Sig Start Date End Date Taking? Authorizing Provider  albuterol (PROVENTIL HFA;VENTOLIN HFA) 108 (90 BASE) MCG/ACT inhaler Inhale 2 puffs into the lungs every 6 (six) hours as needed. For shortness of breath    [provider]  Ascorbic Acid (VITAMIN C PO) Take 3 tablets by mouth daily.    [provider]  aspirin EC 81 MG tablet Take 81 mg by mouth every evening.     [provider]  CRANBERRY PO Take 1 tablet by mouth daily.    [provider]  Cyanocobalamin (VITAMIN B12 PO) Take 1 tablet by mouth daily.    [provider]  diltiazem (CARDIZEM CD) 240 MG 24 hr capsule Take 240 mg by mouth daily.    [provider]  DULoxetine HCl 30 MG CSDR Take 30 mg by  mouth daily.     [provider]  ibuprofen (ADVIL,MOTRIN) 200 MG tablet Take 200 mg by mouth every 6 (six) hours as needed.    [provider]  Insulin Degludec (TRESIBA [place]) Inject 50 Units into the skin daily.     [provider]  irbesartan-hydrochlorothiazide (AVALIDE) 300-12.5 MG tablet Take 1 tablet by mouth every evening.     [provider]  metFORMIN (GLUCOPHAGE) 1000 MG tablet Take 1 tablet (1,000 mg total) by mouth 2 (two) times daily with a meal. Restart on 8/22 12/27/18   [name], DO  metoprolol succinate (TOPROL-XL) 50 MG 24 hr tablet Take 50 mg by mouth every evening. Take with or immediately following a meal.     [provider]  NOVOLOG FLEXPEN 100 UNIT/ML FlexPen Inject 8 Units into the skin 3 (three) times daily before meals. 11/28/18   [provider]  Omega-3 Fatty Acids (FISH OIL PO) Take 2 capsules by mouth daily.    [provider]  pantoprazole (PROTONIX) 40 MG tablet Take 40 mg by mouth daily. 11/19/18   [provider]  pravastatin (PRAVACHOL) 40 MG tablet Take 40 mg by mouth every evening.     [provider]    Allergies Ciprofloxacin and Levofloxacin   REVIEW OF SYSTEMS  Negative except as noted here or in the History of Present Illness.   PHYSICAL EXAMINATION  Initial Vital Signs Blood pressure (!) 137/94, pulse 78, temperature 99 F (37.2 C), temperature source Oral, resp. rate 20, height 5\' 9"  (1.753 m), weight 81.6 kg, SpO2 96 %.  Examination General: Well-developed, well-nourished male in no acute distress; appearance consistent with age of record HENT: normocephalic; atraumatic; no pharyngeal erythema Eyes: pupils equal, round and reactive to light; extraocular muscles intact Neck: supple Heart: regular rate and rhythm Lungs: End inspiratory and expiratory wheezes Abdomen: soft; nondistended; nontender; reducible midline hernia; bowel sounds present Extremities: No deformity; full range of motion; pulses normal Neurologic: Awake, alert and oriented; motor function intact in all extremities and symmetric; no facial droop Skin: Warm and dry Psychiatric: Normal mood and affect   RESULTS  Summary of this visit's results, reviewed and interpreted by myself:   EKG Interpretation  Date/Time:    Ventricular Rate:    PR Interval:    QRS Duration:   QT Interval:    QTC Calculation:   R Axis:     Text Interpretation:        Laboratory Studies: No results found for this or any previous visit (from the past 24 hour(s)). Imaging Studies: Dg Chest Portable 1 View  Result Date: 04/02/2019 CLINICAL DATA:  Cough.  Dyspnea. EXAM: PORTABLE CHEST 1 VIEW COMPARISON:  06/11/2017 FINDINGS: The heart size is enlarged. There is no pneumothorax or large pleural effusion. There is  some atelectasis at the lung bases. No large focal infiltrate. Aortic calcifications are noted. IMPRESSION: No active disease. Electronically Signed   By: Constance Holster M.D.   On: 04/02/2019 22:45    ED COURSE and MDM  Nursing notes, initial and subsequent vitals signs, including pulse oximetry, reviewed and interpreted by myself.  Vitals:   04/02/19 2200 04/02/19 2241 04/02/19 2348  BP: (!) 137/94    Pulse: 78    Resp: 20    Temp: 99 F (37.2 C)    TempSrc: Oral    SpO2: (!) 88% 96% 96%  Weight: 81.6 kg    Height: 5\' 9"  (1.753 m)     Medications  albuterol (VENTOLIN HFA) 108 (90 Base) MCG/ACT inhaler 2 puff (2 puffs Inhalation Given 04/02/19 2345)  famotidine (PEPCID) IVPB 20 mg premix ( Intravenous Stopped 04/03/19 0017)    12:20 AM Wheezing resolved, air movement improved after albuterol inhaler treatment.  Prophylactic antibiotics have not been shown to be beneficial in the setting of normal chest x-ray.  Patient will be advised to have himself reevaluated should he develop fever or worsening shortness of breath.  Patient has home oxygen as needed especially when sleeping.  His oxygen saturation is ranging from 88% to 96%.  PROCEDURES  Procedures   ED DIAGNOSES     ICD-10-CM   1. Acute bronchospasm  J98.01   2. Choking episode  R09.89        Shanon Rosser, MD 04/03/19 0025

## 2019-05-27 DIAGNOSIS — E118 Type 2 diabetes mellitus with unspecified complications: Secondary | ICD-10-CM | POA: Diagnosis not present

## 2019-05-30 DIAGNOSIS — E118 Type 2 diabetes mellitus with unspecified complications: Secondary | ICD-10-CM | POA: Diagnosis not present

## 2019-05-30 DIAGNOSIS — G4733 Obstructive sleep apnea (adult) (pediatric): Secondary | ICD-10-CM | POA: Diagnosis not present

## 2019-05-30 DIAGNOSIS — M170 Bilateral primary osteoarthritis of knee: Secondary | ICD-10-CM | POA: Diagnosis not present

## 2019-05-30 DIAGNOSIS — N183 Chronic kidney disease, stage 3 unspecified: Secondary | ICD-10-CM | POA: Diagnosis not present

## 2019-05-30 DIAGNOSIS — R7401 Elevation of levels of liver transaminase levels: Secondary | ICD-10-CM | POA: Diagnosis not present

## 2019-05-30 DIAGNOSIS — F39 Unspecified mood [affective] disorder: Secondary | ICD-10-CM | POA: Diagnosis not present

## 2019-05-30 DIAGNOSIS — I129 Hypertensive chronic kidney disease with stage 1 through stage 4 chronic kidney disease, or unspecified chronic kidney disease: Secondary | ICD-10-CM | POA: Diagnosis not present

## 2019-05-30 DIAGNOSIS — K219 Gastro-esophageal reflux disease without esophagitis: Secondary | ICD-10-CM | POA: Diagnosis not present

## 2019-06-10 DIAGNOSIS — N183 Chronic kidney disease, stage 3 unspecified: Secondary | ICD-10-CM | POA: Diagnosis not present

## 2019-06-10 DIAGNOSIS — Z794 Long term (current) use of insulin: Secondary | ICD-10-CM | POA: Diagnosis not present

## 2019-06-10 DIAGNOSIS — E118 Type 2 diabetes mellitus with unspecified complications: Secondary | ICD-10-CM | POA: Diagnosis not present

## 2019-06-10 DIAGNOSIS — I129 Hypertensive chronic kidney disease with stage 1 through stage 4 chronic kidney disease, or unspecified chronic kidney disease: Secondary | ICD-10-CM | POA: Diagnosis not present

## 2019-06-10 DIAGNOSIS — F439 Reaction to severe stress, unspecified: Secondary | ICD-10-CM | POA: Diagnosis not present

## 2019-06-11 DIAGNOSIS — F39 Unspecified mood [affective] disorder: Secondary | ICD-10-CM | POA: Diagnosis not present

## 2019-06-11 DIAGNOSIS — E118 Type 2 diabetes mellitus with unspecified complications: Secondary | ICD-10-CM | POA: Diagnosis not present

## 2019-07-10 DIAGNOSIS — F39 Unspecified mood [affective] disorder: Secondary | ICD-10-CM | POA: Diagnosis not present

## 2019-07-29 DIAGNOSIS — N182 Chronic kidney disease, stage 2 (mild): Secondary | ICD-10-CM | POA: Diagnosis not present

## 2019-07-29 DIAGNOSIS — E118 Type 2 diabetes mellitus with unspecified complications: Secondary | ICD-10-CM | POA: Diagnosis not present

## 2019-07-29 DIAGNOSIS — Z794 Long term (current) use of insulin: Secondary | ICD-10-CM | POA: Diagnosis not present

## 2019-07-29 DIAGNOSIS — I129 Hypertensive chronic kidney disease with stage 1 through stage 4 chronic kidney disease, or unspecified chronic kidney disease: Secondary | ICD-10-CM | POA: Diagnosis not present

## 2019-08-07 DIAGNOSIS — F39 Unspecified mood [affective] disorder: Secondary | ICD-10-CM | POA: Diagnosis not present

## 2019-09-12 DIAGNOSIS — Z0000 Encounter for general adult medical examination without abnormal findings: Secondary | ICD-10-CM | POA: Diagnosis not present

## 2019-09-12 DIAGNOSIS — Z125 Encounter for screening for malignant neoplasm of prostate: Secondary | ICD-10-CM | POA: Diagnosis not present

## 2019-09-12 DIAGNOSIS — E118 Type 2 diabetes mellitus with unspecified complications: Secondary | ICD-10-CM | POA: Diagnosis not present

## 2019-09-12 DIAGNOSIS — E7849 Other hyperlipidemia: Secondary | ICD-10-CM | POA: Diagnosis not present

## 2019-09-12 DIAGNOSIS — F39 Unspecified mood [affective] disorder: Secondary | ICD-10-CM | POA: Diagnosis not present

## 2019-09-17 DIAGNOSIS — R0902 Hypoxemia: Secondary | ICD-10-CM | POA: Diagnosis not present

## 2019-09-17 DIAGNOSIS — N182 Chronic kidney disease, stage 2 (mild): Secondary | ICD-10-CM | POA: Diagnosis not present

## 2019-09-17 DIAGNOSIS — F39 Unspecified mood [affective] disorder: Secondary | ICD-10-CM | POA: Diagnosis not present

## 2019-09-17 DIAGNOSIS — Z1331 Encounter for screening for depression: Secondary | ICD-10-CM | POA: Diagnosis not present

## 2019-09-17 DIAGNOSIS — E118 Type 2 diabetes mellitus with unspecified complications: Secondary | ICD-10-CM | POA: Diagnosis not present

## 2019-09-17 DIAGNOSIS — J45909 Unspecified asthma, uncomplicated: Secondary | ICD-10-CM | POA: Diagnosis not present

## 2019-09-17 DIAGNOSIS — Z0000 Encounter for general adult medical examination without abnormal findings: Secondary | ICD-10-CM | POA: Diagnosis not present

## 2019-09-17 DIAGNOSIS — E785 Hyperlipidemia, unspecified: Secondary | ICD-10-CM | POA: Diagnosis not present

## 2019-09-17 DIAGNOSIS — I129 Hypertensive chronic kidney disease with stage 1 through stage 4 chronic kidney disease, or unspecified chronic kidney disease: Secondary | ICD-10-CM | POA: Diagnosis not present

## 2019-09-17 DIAGNOSIS — G4733 Obstructive sleep apnea (adult) (pediatric): Secondary | ICD-10-CM | POA: Diagnosis not present

## 2019-09-17 DIAGNOSIS — R82998 Other abnormal findings in urine: Secondary | ICD-10-CM | POA: Diagnosis not present

## 2019-09-18 DIAGNOSIS — Z1212 Encounter for screening for malignant neoplasm of rectum: Secondary | ICD-10-CM | POA: Diagnosis not present

## 2019-10-16 DIAGNOSIS — L814 Other melanin hyperpigmentation: Secondary | ICD-10-CM | POA: Diagnosis not present

## 2019-10-16 DIAGNOSIS — L918 Other hypertrophic disorders of the skin: Secondary | ICD-10-CM | POA: Diagnosis not present

## 2019-10-16 DIAGNOSIS — L820 Inflamed seborrheic keratosis: Secondary | ICD-10-CM | POA: Diagnosis not present

## 2019-10-16 DIAGNOSIS — L821 Other seborrheic keratosis: Secondary | ICD-10-CM | POA: Diagnosis not present

## 2019-10-16 DIAGNOSIS — D485 Neoplasm of uncertain behavior of skin: Secondary | ICD-10-CM | POA: Diagnosis not present

## 2019-10-16 DIAGNOSIS — L570 Actinic keratosis: Secondary | ICD-10-CM | POA: Diagnosis not present

## 2019-10-21 DIAGNOSIS — H9193 Unspecified hearing loss, bilateral: Secondary | ICD-10-CM | POA: Diagnosis not present

## 2019-10-28 DIAGNOSIS — R0781 Pleurodynia: Secondary | ICD-10-CM | POA: Diagnosis not present

## 2019-10-28 DIAGNOSIS — N059 Unspecified nephritic syndrome with unspecified morphologic changes: Secondary | ICD-10-CM | POA: Diagnosis not present

## 2019-11-12 DIAGNOSIS — E119 Type 2 diabetes mellitus without complications: Secondary | ICD-10-CM | POA: Diagnosis not present

## 2019-11-12 DIAGNOSIS — Z961 Presence of intraocular lens: Secondary | ICD-10-CM | POA: Diagnosis not present

## 2019-11-12 DIAGNOSIS — H33102 Unspecified retinoschisis, left eye: Secondary | ICD-10-CM | POA: Diagnosis not present

## 2019-11-12 DIAGNOSIS — H5213 Myopia, bilateral: Secondary | ICD-10-CM | POA: Diagnosis not present

## 2019-11-19 DIAGNOSIS — N182 Chronic kidney disease, stage 2 (mild): Secondary | ICD-10-CM | POA: Diagnosis not present

## 2019-11-19 DIAGNOSIS — I129 Hypertensive chronic kidney disease with stage 1 through stage 4 chronic kidney disease, or unspecified chronic kidney disease: Secondary | ICD-10-CM | POA: Diagnosis not present

## 2019-11-19 DIAGNOSIS — E118 Type 2 diabetes mellitus with unspecified complications: Secondary | ICD-10-CM | POA: Diagnosis not present

## 2019-11-19 DIAGNOSIS — Z794 Long term (current) use of insulin: Secondary | ICD-10-CM | POA: Diagnosis not present

## 2020-02-25 ENCOUNTER — Other Ambulatory Visit: Payer: Self-pay | Admitting: Internal Medicine

## 2020-02-25 DIAGNOSIS — M170 Bilateral primary osteoarthritis of knee: Secondary | ICD-10-CM | POA: Diagnosis not present

## 2020-02-25 DIAGNOSIS — R413 Other amnesia: Secondary | ICD-10-CM | POA: Diagnosis not present

## 2020-02-25 DIAGNOSIS — Z794 Long term (current) use of insulin: Secondary | ICD-10-CM | POA: Diagnosis not present

## 2020-02-25 DIAGNOSIS — E118 Type 2 diabetes mellitus with unspecified complications: Secondary | ICD-10-CM | POA: Diagnosis not present

## 2020-02-25 DIAGNOSIS — I129 Hypertensive chronic kidney disease with stage 1 through stage 4 chronic kidney disease, or unspecified chronic kidney disease: Secondary | ICD-10-CM | POA: Diagnosis not present

## 2020-02-25 DIAGNOSIS — F39 Unspecified mood [affective] disorder: Secondary | ICD-10-CM | POA: Diagnosis not present

## 2020-02-25 DIAGNOSIS — N059 Unspecified nephritic syndrome with unspecified morphologic changes: Secondary | ICD-10-CM | POA: Diagnosis not present

## 2020-02-25 DIAGNOSIS — E785 Hyperlipidemia, unspecified: Secondary | ICD-10-CM | POA: Diagnosis not present

## 2020-02-25 DIAGNOSIS — R748 Abnormal levels of other serum enzymes: Secondary | ICD-10-CM | POA: Diagnosis not present

## 2020-02-25 DIAGNOSIS — N182 Chronic kidney disease, stage 2 (mild): Secondary | ICD-10-CM | POA: Diagnosis not present

## 2020-03-16 ENCOUNTER — Other Ambulatory Visit: Payer: Self-pay

## 2020-03-16 ENCOUNTER — Ambulatory Visit
Admission: RE | Admit: 2020-03-16 | Discharge: 2020-03-16 | Disposition: A | Discharge status: Home / Self Care | Payer: Medicare HMO | Source: Ambulatory Visit | Attending: Internal Medicine | Admitting: Internal Medicine

## 2020-03-16 DIAGNOSIS — R413 Other amnesia: Secondary | ICD-10-CM | POA: Diagnosis not present

## 2020-03-24 DIAGNOSIS — F39 Unspecified mood [affective] disorder: Secondary | ICD-10-CM | POA: Diagnosis not present

## 2020-03-24 DIAGNOSIS — R413 Other amnesia: Secondary | ICD-10-CM | POA: Diagnosis not present

## 2020-03-24 DIAGNOSIS — I129 Hypertensive chronic kidney disease with stage 1 through stage 4 chronic kidney disease, or unspecified chronic kidney disease: Secondary | ICD-10-CM | POA: Diagnosis not present

## 2020-03-24 DIAGNOSIS — N182 Chronic kidney disease, stage 2 (mild): Secondary | ICD-10-CM | POA: Diagnosis not present

## 2020-03-24 DIAGNOSIS — R2681 Unsteadiness on feet: Secondary | ICD-10-CM | POA: Diagnosis not present

## 2020-03-29 ENCOUNTER — Encounter: Payer: Self-pay | Admitting: Neurology

## 2020-03-30 ENCOUNTER — Ambulatory Visit: Payer: Medicare HMO | Admitting: Neurology

## 2020-03-30 DIAGNOSIS — Z20822 Contact with and (suspected) exposure to covid-19: Secondary | ICD-10-CM | POA: Diagnosis not present

## 2020-04-07 DIAGNOSIS — I129 Hypertensive chronic kidney disease with stage 1 through stage 4 chronic kidney disease, or unspecified chronic kidney disease: Secondary | ICD-10-CM | POA: Diagnosis not present

## 2020-04-07 DIAGNOSIS — E118 Type 2 diabetes mellitus with unspecified complications: Secondary | ICD-10-CM | POA: Diagnosis not present

## 2020-04-07 DIAGNOSIS — N182 Chronic kidney disease, stage 2 (mild): Secondary | ICD-10-CM | POA: Diagnosis not present

## 2020-04-07 DIAGNOSIS — Z794 Long term (current) use of insulin: Secondary | ICD-10-CM | POA: Diagnosis not present

## 2020-04-15 ENCOUNTER — Ambulatory Visit: Payer: Medicare HMO | Admitting: Neurology

## 2020-04-15 ENCOUNTER — Encounter: Payer: Self-pay | Admitting: Neurology

## 2020-04-15 VITALS — BP 113/67 | HR 77 | Ht 69.0 in | Wt 197.0 lb

## 2020-04-15 DIAGNOSIS — Z794 Long term (current) use of insulin: Secondary | ICD-10-CM | POA: Diagnosis not present

## 2020-04-15 DIAGNOSIS — G63 Polyneuropathy in diseases classified elsewhere: Secondary | ICD-10-CM

## 2020-04-15 DIAGNOSIS — E1140 Type 2 diabetes mellitus with diabetic neuropathy, unspecified: Secondary | ICD-10-CM

## 2020-04-15 DIAGNOSIS — N028 Recurrent and persistent hematuria with other morphologic changes: Secondary | ICD-10-CM

## 2020-04-15 DIAGNOSIS — G3184 Mild cognitive impairment, so stated: Secondary | ICD-10-CM

## 2020-04-15 DIAGNOSIS — D472 Monoclonal gammopathy: Secondary | ICD-10-CM

## 2020-04-15 DIAGNOSIS — Z9989 Dependence on other enabling machines and devices: Secondary | ICD-10-CM | POA: Diagnosis not present

## 2020-04-15 DIAGNOSIS — G4733 Obstructive sleep apnea (adult) (pediatric): Secondary | ICD-10-CM | POA: Diagnosis not present

## 2020-04-15 MED ORDER — DONEPEZIL HCL 5 MG PO TABS: 5.0000 mg | ORAL_TABLET | Freq: Every day | ORAL | 5 refills | Status: DC

## 2020-04-15 NOTE — Progress Notes (Signed)
SLEEP MEDICINE CLINIC    Provider:  Larey Seat, MD  Primary Care Physician:  Prince Solian, [place] [address]     Referring Provider: Prince Solian, [address] [place],  [place] [id number]          Chief Complaint according to patient   Patient presents with:    . New Patient (Initial Visit)     New problem CONSULTATION for evalaution  of memory loss in established Sleep patient.  Confusional episodes, forgetting short term , forgetting numbers, executive failures.       HISTORY OF PRESENT ILLNESS:  Donald [name] is a 69 y.o. year old  Caucasian male patient seen here in a RV but with a NEW PROBLEM  on 04/15/2020.   04-15-2020: Donald [name] is a 69 y.o. male , seen here as in a referral from Donald [name] for a new evaluation of apnea. Patient is a highly compliant CPAP user since receiving a diagnosis of mild OSA and severe Hypoxemia in 2015. Here today not only to follow up for sleep, but with a concern about memory decline.  Donald [name] is here today with his spouse both voiced a concern about his short-term memory function, and he often has to look at instructions calendars for days again and again because he tends to forget them within minutes he reports.  His primary care physician Dr. Eartha Inch had already ordered MRI of the brain with CT clinical data of memory loss over the last 2 years.  The patient is status post cervical fusion procedure beginning at C3 and he had no acute findings on his MRI dated 11/22-2021.  Age-related volume loss was commented on mild chronic small vessel ischemic changes, tortuous and ectatic vessels were noted but no aneurysm or calcification or stenosis.  And left mastoid effusion was coincidentally also commented upon this MRI of the brain was done without contrast probably due to the patient's history of a chronic renal impairment.  It is his wife's impression that he mind is getting more and more foggy and he  unsure about driving, he has a continuous headache.       PAST VISIT :  Donald [name]. [name] is a meanwhile 69 year old right-handed Caucasian gentleman and avid motorcyclist.  He was first seen for a sleep evaluation in May 2013 by Dr. Danton Sewer.. This was followed by a sleep study dated 27 October 2011.  He was not happy with the results and care but was given an oxygen concentrator from [facility] he remembers that the oxygen was delivered already in March 2013 after an overnight pulse oximetry.  A new sleep evaluation was performed through me at [facility] in December 2014 and he underwent another sleep study here.  On 25 April 2013 his sleep study showed an overall AHI of only 5.7/h, and very mild degree of apnea but his oxygen nadir was 77% and he spent sustained time in low oxygen.  The total hypoxemia time at 89 and below was 2 hours and 45 minutes, the patient was therefore placed on oxygen supplement which he already owns as I understand.  He returned for a CPAP titration because I can only order oxygen as an adjunct therapy to obstructive sleep apnea or central sleep apnea therapy.  Oxygen strictly for hypoxemia with exercise and not sleep-related would need to be provided by a pulmonologist.   CPAP titration followed on 04 July 2013 his AHI was  reduced to 1.9 /h.  He reported significant difficulties to stay asleep.  His sleep latency was 65 minutes in the sleep lab.  I understand that the patient has continued to use his CPAP on a regular basis in spite of not having followed up with me.  The CPAP settings that he uses now at the same but he no longer has oxygen available.  He purchased I understand a pulse oximetry device and has used it for nights with CPAP in place and 1 night without CPAP use.  All nights had prolonged severely prolonged hypoxemia.  This only confirms what we saw in 2015 that his apnea is not the main driver of hypoxemia.  Sleep and medical  history: The patient carries a diagnosis of allergic rhinitis, seasonal asthma, IgA nephropathy with chronic kidney disease stage III, degenerative joint disease osteoarthritis, diabetes mellitus type 2, not insulin-dependent.  GERD, heart murmur, hematuria, hypercholesterolemia and hypertension, history of prostatitis, community-acquired pneumonia.  Coronary artery disease evaluation was negative with an EF of 55% in 2013.   Mr. Cradle struggles with sinus congestion often has headaches when sleeping on his left side feeling congested and developing a pressure behind the eyes, he has nocturia 2-3 times each night, he appears depressed may be frustrated, stating that he is not excessively daytime sleepy but he also does not feel refreshed or restored in the mornings.  Family medical and sleep history: His father died at age 55 of coronary artery disease he also had diabetes type 2 and degenerative joint joint disease.  His mother died also early at 79 years of age had a history of nephrolithiasis and coronary artery disease.  His sister who is 6 years younger than the patient has thyroid problems she has undergone bariatric surgery for morbid obesity..   Social history: The patient works as a Government social research officer, he enjoys riding his motorcycle, he denies any use of tobacco or alcohol but lost his job in 2016 and has been unemployed since he has a son age 51, he is married since 1980, he drinks coffee in the morning and crystallite with caffeine after 3 PM in the afternoon he has no history of shiftwork.   Sleep habits are as follows: The patient no longer has oxygen available but uses his CPAP at 9 cm water pressure which is now over 46 years old and he still using nasal pillows.  He describes a dinnertime at around 7 PM bedtime is usually around 9:30 PM always with CPAP, the bedroom is described as cool, quiet and dark he shares a bedroom with his wife.  He falls asleep usually within 15 minutes he has  begun trying to sleep supine which for an apnea patient is not a preferred sleep position but he uses 3 pillows under wedge to keep himself propped up.  He states that when he sleeps on one side sinus congestion sets in and his nose.  Up.  Usually worse when he sleeps on his left side.  He wakes up 2-3 times each night for nocturia and finally rises at 8 AM but only with alarm.  He does not feel refreshed and restored spontaneously and without alarm would be sleeping until noon.  He does not nap in daytime he reports.  He has sometimes been able to fall asleep in a recliner when he is reading or relaxed.  He has isolated occurrences of sleep paralysis.  As I described he has had multiple pulse oximetry's and perform these on himself  with and without CPAP in place and found that he is still significantly hypoxic at this time.  However he is without oxygen.      Donald [name]  has a past medical history of Allergic rhinitis, seasonal, Asthma, Bronchitis, Chronic kidney disease, stage I, collar bone, Degenerative joint disease of knee,  Diabetes mellitus on insulin- , GERD (gastroesophageal reflux disease), Heart murmur, History of acute prostatitis, History of hematuria, Hyperlipidemia, Hypertension, IgA nephropathy, CKD,  OSA on CPAP, Pneumonia, and Sinusitis.   He fell without bracing his fall- July 2021, fell on his head, forehead, he " bounced of the concrete " Frontal lobe injury ? No vomiting, no LOC reported.         Review of Systems: Out of a complete 14 system review, the patient complains of only the following symptoms, and all other reviewed systems are negative.:  Fatigue, headaches, irritability, impulse control failure, now angry, with personality changes.    How likely are you to doze in the following situations: 0 = not likely, 1 = slight chance, 2 = moderate chance, 3 = high chance   Sitting and Reading? Watching Television? Sitting inactive in a public place (theater or  meeting)? As a passenger in a car for an hour without a break? Lying down in the afternoon when circumstances permit? Sitting and talking to someone? Sitting quietly after lunch without alcohol? In a car, while stopped for a few minutes in traffic?   Total = 9/ 24 points   FSS endorsed at N/A/ 63 points.  GDS not answered.   Social History   Socioeconomic History  . Marital status: Married    Spouse name: Diane  . Number of children: 1  . Years of education: 10  . Highest education level: Not on file  Occupational History  . Occupation: Herbalist: Interior and spatial designer  Tobacco Use  . Smoking status: Never Smoker  . Smokeless tobacco: Never Used  . Tobacco comment: Second hand smoke for 26 years per pt  Vaping Use  . Vaping Use: Never used  Substance and Sexual Activity  . Alcohol use: No    Alcohol/week: 0.0 standard drinks  . Drug use: No  . Sexual activity: Not on file  Other Topics Concern  . Not on file  Social History Narrative   Patient is married (Diane) and lives at home with his wife.   Patient has one child.   Patient is working full-time.   Patient has a Geophysicist/field seismologist.   Patient is left-handed.   Patient drinks one glass of tea daily, one soda daily.   Social Determinants of Health   Financial Resource Strain: Not on file  Food Insecurity: Not on file  Transportation Needs: Not on file  Physical Activity: Not on file  Stress: Not on file  Social Connections: Not on file    Family History  Problem Relation Age of Onset  . Coronary artery disease Father   . Diabetes type II Father   . Heart attack Father     Past Medical History:  Diagnosis Date  . Allergic rhinitis, seasonal   . Asthma   . Bronchitis   . Chronic kidney disease, stage I   . collar bone    dislocation left side 05/2014  . Degenerative joint disease of knee    bilateral  . Diabetes mellitus   . GERD (gastroesophageal reflux disease)   . Heart murmur     history of  .  History of acute prostatitis   . History of hematuria   . Hyperlipidemia   . Hypertension   . IgA nephropathy   . OSA on CPAP   . Pneumonia   . Sinusitis     Past Surgical History:  Procedure Laterality Date  . CARDIAC CATHETERIZATION  09/29/2011   normal  . CERVICAL SPINE SURGERY    . deviated septum  1930s  . HERNIA REPAIR    . LEFT HEART CATHETERIZATION WITH CORONARY ANGIOGRAM N/A 09/29/2011   Procedure: LEFT HEART CATHETERIZATION WITH CORONARY ANGIOGRAM;  Surgeon: Sanda Klein, MD;  Location: [facility] CATH LAB;  Service: Cardiovascular;  Laterality: N/A;  . NM MYOVIEW LTD  07/08/2011   mild LV dilatation,mild septal hypokinesia  . TONSILLECTOMY    . US ECHOCARDIOGRAPHY  09/07/2011   EF 35-45%,mod.ant hypokinesis,boderline LA & RA enlargement,trace MR,TR & PI     Current Outpatient Medications on File Prior to Visit  Medication Sig Dispense Refill  . albuterol (PROVENTIL HFA;VENTOLIN HFA) 108 (90 BASE) MCG/ACT inhaler Inhale 2 puffs into the lungs every 6 (six) hours as needed. For shortness of breath    . Ascorbic Acid (VITAMIN C PO) Take 3 tablets by mouth daily.    Marland Kitchen aspirin EC 81 MG tablet Take 81 mg by mouth every evening.     Marland Kitchen CRANBERRY PO Take 1 tablet by mouth daily.    . Cyanocobalamin (VITAMIN B12 PO) Take 1 tablet by mouth daily.    Marland Kitchen diltiazem (CARDIZEM CD) 240 MG 24 hr capsule Take 240 mg by mouth daily.    . DULoxetine HCl 30 MG CSDR Take 30 mg by mouth daily.     Marland Kitchen ibuprofen (ADVIL,MOTRIN) 200 MG tablet Take 200 mg by mouth every 6 (six) hours as needed.    . Insulin Degludec (TRESIBA [place]) Inject 50 Units into the skin daily.     . irbesartan-hydrochlorothiazide (AVALIDE) 300-12.5 MG tablet Take 1 tablet by mouth every evening.     . metFORMIN (GLUCOPHAGE) 1000 MG tablet Take 1 tablet (1,000 mg total) by mouth 2 (two) times daily with a meal. Restart on 8/22    . metoprolol succinate (TOPROL-XL) 50 MG 24 hr tablet Take 50 mg by mouth every evening. Take  with or immediately following a meal.     . NOVOLOG FLEXPEN 100 UNIT/ML FlexPen Inject 8 Units into the skin 3 (three) times daily before meals.    . Omega-3 Fatty Acids (FISH OIL PO) Take 2 capsules by mouth daily.    . pantoprazole (PROTONIX) 40 MG tablet Take 40 mg by mouth daily.    . pravastatin (PRAVACHOL) 40 MG tablet Take 40 mg by mouth every evening.      No current facility-administered medications on file prior to visit.    Allergies  Allergen Reactions  . Ciprofloxacin     Shoulder cramping  . Levofloxacin     cramping    Physical exam:  There were no vitals filed for this visit. There is no height or weight on file to calculate BMI.   Wt Readings from Last 3 Encounters:  04/02/19 180 lb (81.6 kg)  12/26/18 187 lb 9.6 oz (85.1 kg)  06/11/17 175 lb (79.4 kg)     Ht Readings from Last 3 Encounters:  04/02/19 5\' 9"  (1.753 m)  12/26/18 5\' 8"  (1.727 m)  06/11/17 5\' 8"  (1.727 m)      General: The patient is awake, alert and appears not in acute distress. The patient is well  groomed. Head: Normocephalic, atraumatic. Cardiovascular:  Regular rate and cardiac rhythm by pulse,  without distended neck veins. Respiratory: Lungs are clear to auscultation.  Skin:  Without evidence of ankle edema, or rash. Trunk: The patient's posture is erect.   Neurologic exam : The patient is awake and alert, oriented to place and time.   Memory subjective described as impaired-  Montreal Cognitive Assessment  04/15/2020  Visuospatial/ Executive (0/5) 4  Naming (0/3) 3  Attention: Read list of digits (0/2) 2  Attention: Read list of letters (0/1) 1  Attention: Serial 7 subtraction starting at 100 (0/3) 3  Language: Repeat phrase (0/2) 2  Language : Fluency (0/1) 1  Abstraction (0/2) 2  Delayed Recall (0/5) 2  Orientation (0/6) 6  Total 26      Attention span & concentration ability appears restricted.  Speech is fluent,  without  dysarthria, dysphonia or aphasia.  Mood and  affect are here normal, but wife reports episode of inappropriate anger, blunt comments. .   Cranial nerves: no loss of smell or taste reported  Pupils are equal and briskly reactive to light. Funduscopic exam deferred.  Extraocular movements in vertical and horizontal planes were intact and without nystagmus.  No Diplopia. Visual fields by finger perimetry are intact. Hearing was intact to soft voice and finger rubbing.    Facial sensation intact to fine touch.  Facial motor strength is symmetric and tongue and uvula move midline.  Neck ROM : rotation, tilt and flexion extension were normal for age and ligament damage-shoulder shrug was symmetrical- but he reports shoulder pain, arthritis, having lost arm swing. .    Motor exam:  Symmetric bulk, tone and ROM.   Normal tone without cog- wheeling, symmetric grip strength .   Sensory:  Fine touch and vibration were lost in both feet /ankles.  Proprioception tested in the upper extremities was normal.   Coordination: Rapid alternating movements in the fingers/hands were slowed.  The Finger-to-nose maneuver was intact without evidence of ataxia, dysmetria or tremor.   Gait and station: Patient could rise unassisted from a seated position, walked without assistive device.  Stance is of normal width/ base and the patient turned with 4 steps.  He walked with reduced arm swing on the left versus right  Toe and heel walk were deferred.  Deep tendon reflexes: in the  upper and lower extremities are symmetric and trace.      After spending a total time of 45 minutes face to face and additional time for physical and neurologic examination, review of laboratory studies,  personal review of imaging studies, reports and results of other testing and review of referral information / records as far as provided in visit, I have established the following assessments:  1)  He has neuropathy and is at higher fall risk,also slowed in his reaction and  coordination movements. Fall risk increased.  2)  IMPULSE CONTROL LOSS- Unwise decisions, poor judgement , blunt comments and anger, Fontal lobe syndrome?  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 No nausea no photophobia.     My Plan is to proceed with:  1)  Cortical Volumetric MRI.   2)  I like to start Aricept - 5 mg po, will increase to 10 mg if tolerated next visit.  3)  Referral to neuropsychologist. 4)  encourage hydration, exercises, supportive shoes.   I would like to thank  Prince Solian, [address]  [place],  [place] [address] for allowing me to meet with and to take care of this pleasant patient.   In short, Donald [name] is presenting with slowly progressing memory loss.   I plan to follow up either personally or through our NP within 3 month.     Electronically signed by: Larey Seat, MD 04/15/2020 9:52 AM  [facility] Board certified by The [employer] of Sleep Medicine and Diplomate of the [employer] of Sleep Medicine. Board certified In Neurology through the [employer], Fellow of the [employer] of Neurology. Medical Director of [facility].

## 2020-04-15 NOTE — Patient Instructions (Signed)
Donepezil tablets What is this medicine? DONEPEZIL (doe NEP e zil) is used to treat mild to moderate dementia caused by Alzheimer's disease. This medicine may be used for other purposes; ask your health care provider or pharmacist if you have questions. COMMON BRAND NAME(S): Aricept What should I tell my health care provider before I take this medicine? They need to know if you have any of these conditions:  asthma or other lung disease  difficulty passing urine  head injury  heart disease  history of irregular heartbeat  liver disease  seizures (convulsions)  stomach or intestinal disease, ulcers or stomach bleeding  an unusual or allergic reaction to donepezil, other medicines, foods, dyes, or preservatives  pregnant or trying to get pregnant  breast-feeding How should I use this medicine? Take this medicine by mouth with a glass of water. Follow the directions on the prescription label. You may take this medicine with or without food. Take this medicine at regular intervals. This medicine is usually taken before bedtime. Do not take it more often than directed. Continue to take your medicine even if you feel better. Do not stop taking except on your doctor's advice. If you are taking the 23 mg donepezil tablet, swallow it whole; do not cut, crush, or chew it. Talk to your pediatrician regarding the use of this medicine in children. Special care may be needed. Overdosage: If you think you have taken too much of this medicine contact a poison control center or emergency room at once. NOTE: This medicine is only for you. Do not share this medicine with others. What if I miss a dose? If you miss a dose, take it as soon as you can. If it is almost time for your next dose, take only that dose, do not take double or extra doses. What may interact with this medicine? Do not take this medicine with any of the following medications:  certain medicines for fungal infections like  itraconazole, fluconazole, posaconazole, and voriconazole  cisapride  dextromethorphan; quinidine  dronedarone  pimozide  quinidine  thioridazine This medicine may also interact with the following medications:  antihistamines for allergy, cough and cold  atropine  bethanechol  carbamazepine  certain medicines for bladder problems like oxybutynin, tolterodine  certain medicines for Parkinson's disease like benztropine, trihexyphenidyl  certain medicines for stomach problems like dicyclomine, hyoscyamine  certain medicines for travel sickness like scopolamine  dexamethasone  dofetilide  ipratropium  NSAIDs, medicines for pain and inflammation, like ibuprofen or naproxen  other medicines for Alzheimer's disease  other medicines that prolong the QT interval (cause an abnormal heart rhythm)  phenobarbital  phenytoin  rifampin, rifabutin or rifapentine  ziprasidone This list may not describe all possible interactions. Give your health care provider a list of all the medicines, herbs, non-prescription drugs, or dietary supplements you use. Also tell them if you smoke, drink alcohol, or use illegal drugs. Some items may interact with your medicine. What should I watch for while using this medicine? Visit your doctor or health care professional for regular checks on your progress. Check with your doctor or health care professional if your symptoms do not get better or if they get worse. You may get drowsy or dizzy. Do not drive, use machinery, or do anything that needs mental alertness until you know how this drug affects you. What side effects may I notice from receiving this medicine? Side effects that you should report to your doctor or health care professional as soon as possible:    allergic reactions like skin rash, itching or hives, swelling of the face, lips, or tongue  feeling faint or lightheaded, falls  loss of bladder control  seizures  signs and  symptoms of a dangerous change in heartbeat or heart rhythm like chest pain; dizziness; fast or irregular heartbeat; palpitations; feeling faint or lightheaded, falls; breathing problems  signs and symptoms of infection like fever or chills; cough; sore throat; pain or trouble passing urine  signs and symptoms of liver injury like dark yellow or brown urine; general ill feeling or flu-like symptoms; light-colored stools; loss of appetite; nausea; right upper belly pain; unusually weak or tired; yellowing of the eyes or skin  slow heartbeat or palpitations  unusual bleeding or bruising  vomiting Side effects that usually do not require medical attention (report to your doctor or health care professional if they continue or are bothersome):  diarrhea, especially when starting treatment  headache  loss of appetite  muscle cramps  nausea  stomach upset This list may not describe all possible side effects. Call your doctor for medical advice about side effects. You may report side effects to FDA at 1-800-FDA-1088. Where should I keep my medicine? Keep out of reach of children. Store at room temperature between 15 and 30 degrees C (59 and 86 degrees F). Throw away any unused medicine after the expiration date. NOTE: This sheet is a summary. It may not cover all possible information. If you have questions about this medicine, talk to your doctor, pharmacist, or health care provider.  2020 Elsevier/Gold Standard (2018-04-15 10:33:41)     There are well-accepted and sensible ways to reduce risk for Alzheimers disease and other degenerative brain disorders .  Exercise Daily Walk A daily 20 minute walk should be part of your routine. Disease related apathy can be a significant roadblock to exercise and the only way to overcome this is to make it a daily routine and perhaps have a reward at the end (something your loved one loves to eat or drink perhaps) or a personal trainer coming to the  home can also be very useful. Most importantly, the patient is much more likely to exercise if the caregiver / spouse does it with him/her. In general a structured, repetitive schedule is best.  General Health: Any diseases which effect your body will effect your brain such as a pneumonia, urinary infection, blood clot, heart attack or stroke. Keep contact with your primary care doctor for regular follow ups.  Sleep. A good nights sleep is healthy for the brain. Seven hours is recommended. If you have insomnia or poor sleep habits we can give you some instructions. If you have sleep apnea wear your mask.  Diet: Eating a heart healthy diet is also a good idea; fish and poultry instead of red meat, nuts (mostly non-peanuts), vegetables, fruits, olive oil or canola oil (instead of butter), minimal salt (use other spices to flavor foods), whole grain rice, bread, cereal and pasta and wine in moderation.Research is now showing that the MIND diet, which is a combination of The Mediterranean diet and the DASH diet, is beneficial for cognitive processing and longevity. Information about this diet can be found in The MIND Diet, a book by Doyne Keel, MS, RDN, and online at [employer]bookDist[employer].si  Finances, Power of Attorney and Advance Directives: You should consider putting legal safeguards in place with regard to financial and medical decision making. While the spouse always has power of attorney for medical and financial issues in the absence  of any form, you should consider what you want in case the spouse / caregiver is no longer around or capable of making decisions.   The Alzheimers Association Position on Disease Prevention  Can Alzheimer's be prevented? It's a question that continues to intrigue researchers and fuel new investigations. There are no clear-cut answers yet -- partially due to the need for more large-scale studies in diverse populations -- but promising research is  under way. The Alzheimer's Association is leading the worldwide effort to find a treatment for Alzheimer's, delay its onset and prevent it from developing.   What causes Alzheimer's? Experts agree that in the vast majority of cases, Alzheimer's, like other common chronic conditions, probably develops as a result of complex interactions among multiple factors, including age, genetics, environment, lifestyle and coexisting medical conditions. Although some risk factors -- such as age or genes -- cannot be changed, other risk factors -- such as high blood pressure and lack of exercise -- usually can be changed to help reduce risk. Research in these areas may lead to new ways to detect those at highest risk.  Prevention studies A small percentage of people with Alzheimer's disease (less than 1 percent) have an early-onset type associated with genetic mutations. Individuals who have these genetic mutations are guaranteed to develop the disease. An ongoing clinical trial conducted by the [facility] ([facility]), is testing whether antibodies to beta-amyloid can reduce the accumulation of beta-amyloid plaque in the brains of people with such genetic mutations and thereby reduce, delay or prevent symptoms. Participants in the trial are receiving antibodies (or placebo) before they develop symptoms, and the development of beta-amyloid plaques is being monitored by brain scans and other tests.  Another clinical trial, known as the A4 trial (Anti-Amyloid Treatment in Asymptomatic Alzheimer's), is testing whether antibodies to beta-amyloid can reduce the risk of Alzheimer's disease in older people (ages 53 to 80) at high risk for the disease. The A4 trial is being conducted by the Alzheimer's Disease Cooperative Study.  Though research is still evolving, evidence is strong that people can reduce their risk by making key lifestyle changes, including participating in regular activity and  maintaining good heart health. Based on this research, the Alzheimer's Association offers 10 Ways to Love Your Brain -- a collection of tips that can reduce the risk of cognitive decline.  Heart-head connection  New research shows there are things we can do to reduce the risk of mild cognitive impairment and dementia.  Several conditions known to increase the risk of cardiovascular disease -- such as high blood pressure, diabetes and high cholesterol -- also increase the risk of developing Alzheimer's. Some autopsy studies show that as many as 40 percent of individuals with Alzheimer's disease also have cardiovascular disease.  A longstanding question is why some people develop hallmark Alzheimer's plaques and tangles but do not develop the symptoms of Alzheimer's. Vascular disease may help researchers eventually find an answer. Some autopsy studies suggest that plaques and tangles may be present in the brain without causing symptoms of cognitive decline unless the brain also shows evidence of vascular disease. More research is needed to better understand the link between vascular health and Alzheimer's.  Physical exercise and diet Regular physical exercise may be a beneficial strategy to lower the risk of Alzheimer's and vascular dementia. Exercise may directly benefit brain cells by increasing blood and oxygen flow in the brain. Because of its known cardiovascular benefits, a medically approved exercise program is a valuable  part of any overall wellness plan.  Current evidence suggests that heart-healthy eating may also help protect the brain. Heart-healthy eating includes limiting the intake of sugar and saturated fats and making sure to eat plenty of fruits, vegetables, and whole grains. No one diet is best. Two diets that have been studied and may be beneficial are the DASH (Dietary Approaches to Stop Hypertension) diet and the Mediterranean diet. The DASH diet emphasizes vegetables, fruits and  fat-free or low-fat dairy products; includes whole grains, fish, poultry, beans, seeds, nuts and vegetable oils; and limits sodium, sweets, sugary beverages and red meats. A Mediterranean diet includes relatively little red meat and emphasizes whole grains, fruits and vegetables, fish and shellfish, and nuts, olive oil and other healthy fats.  Social connections and intellectual activity A number of studies indicate that maintaining strong social connections and keeping mentally active as we age might lower the risk of cognitive decline and Alzheimer's. Experts are not certain about the reason for this association. It may be due to direct mechanisms through which social and mental stimulation strengthen connections between nerve cells in the brain.  Head trauma There appears to be a strong link between future risk of Alzheimer's and serious head trauma, especially when injury involves loss of consciousness. You can help reduce your risk of Alzheimer's by protecting your head. . Wear a seat belt . Use a helmet when participating in sports . "Fall-proof" your home .  What you can do now While research is not yet conclusive, certain lifestyle choices, such as physical activity and diet, may help support brain health and prevent Alzheimer's. Many of these lifestyle changes have been shown to lower the risk of other diseases, like heart disease and diabetes, which have been linked to Alzheimer's. With few drawbacks and plenty of known benefits, healthy lifestyle choices can improve your health and possibly protect your brain.  Learn more about brain health. You can help increase our knowledge by considering participation in a clinical study. Our free clinical trial matching services, TrialMatch, can help you find clinical trials in your area that are seeking volunteers.  Understanding prevention research Here are some things to keep in mind about the research underlying much of our current knowledge  about possible prevention: . Insights about potentially modifiable risk factors apply to large population groups, not to individuals. Studies can show that factor X is associated with outcome Y, but cannot guarantee that any specific person will have that outcome. As a result, you can "do everything right" and still have a serious health problem or "do everything wrong" and live to be 100. . Much of our current evidence comes from large epidemiological studies such as the [place] Aging Study, the Nurses' Health Study, the Adult Changes in Thought Study and the [employer] These studies explore pre-existing behaviors and use statistical methods to relate those behaviors to health outcomes. This type of study can show an "association" between a factor and an outcome but cannot "prove" cause and effect. This is why we describe evidence based on these studies with such language as "suggests," "may show," "might protect," and "is associated with." . The gold standard for showing cause and effect is a clinical trial in which participants are randomly assigned to a prevention or risk management strategy or a control group. Researchers follow the two groups over time to see if their outcomes differ significantly. . It is unlikely that some prevention or risk management strategies will ever be tested in randomized trials  for ethical or practical reasons. One example is exercise. Definitively testing the impact of exercise on Alzheimer's risk would require a huge trial enrolling thousands of people and following them for many years. The expense and logistics of such a trial would be prohibitive, and it would require some people to go without exercise, a known health benefit.

## 2020-04-16 ENCOUNTER — Other Ambulatory Visit: Payer: Self-pay | Admitting: Gastroenterology

## 2020-04-16 DIAGNOSIS — K317 Polyp of stomach and duodenum: Secondary | ICD-10-CM | POA: Diagnosis not present

## 2020-04-16 DIAGNOSIS — Z86010 Personal history of colonic polyps: Secondary | ICD-10-CM | POA: Diagnosis not present

## 2020-04-19 ENCOUNTER — Telehealth: Payer: Self-pay | Admitting: Neurology

## 2020-04-19 NOTE — Telephone Encounter (Signed)
Donald [name] Donald [name]: [id number] (exp. 04/19/20 to 05/19/20) patient is scheduled at GI for 05/16/19.

## 2020-04-19 NOTE — Telephone Encounter (Signed)
[employer] pending  

## 2020-05-04 ENCOUNTER — Other Ambulatory Visit: Payer: Self-pay

## 2020-05-13 ENCOUNTER — Other Ambulatory Visit (HOSPITAL_COMMUNITY)
Admission: RE | Admit: 2020-05-13 | Discharge: 2020-05-13 | Disposition: A | Discharge status: Home / Self Care | Payer: HMO | Source: Ambulatory Visit | Attending: Gastroenterology | Admitting: Gastroenterology

## 2020-05-13 DIAGNOSIS — Z01818 Encounter for other preprocedural examination: Secondary | ICD-10-CM | POA: Diagnosis not present

## 2020-05-13 DIAGNOSIS — Z20822 Contact with and (suspected) exposure to covid-19: Secondary | ICD-10-CM | POA: Insufficient documentation

## 2020-05-13 LAB — SARS CORONAVIRUS 2 (TAT 6-24 HRS): SARS Coronavirus 2: NEGATIVE

## 2020-05-15 ENCOUNTER — Other Ambulatory Visit: Payer: Self-pay

## 2020-05-15 ENCOUNTER — Ambulatory Visit
Admission: RE | Admit: 2020-05-15 | Discharge: 2020-05-15 | Disposition: A | Discharge status: Home / Self Care | Payer: Medicare HMO | Source: Ambulatory Visit | Attending: Neurology | Admitting: Neurology

## 2020-05-15 DIAGNOSIS — N028 Recurrent and persistent hematuria with other morphologic changes: Secondary | ICD-10-CM

## 2020-05-15 DIAGNOSIS — G3184 Mild cognitive impairment, so stated: Secondary | ICD-10-CM | POA: Diagnosis not present

## 2020-05-15 DIAGNOSIS — D472 Monoclonal gammopathy: Secondary | ICD-10-CM

## 2020-05-15 DIAGNOSIS — E1140 Type 2 diabetes mellitus with diabetic neuropathy, unspecified: Secondary | ICD-10-CM

## 2020-05-15 DIAGNOSIS — G4733 Obstructive sleep apnea (adult) (pediatric): Secondary | ICD-10-CM

## 2020-05-15 MED ORDER — GADOBENATE DIMEGLUMINE 529 MG/ML IV SOLN
18.0000 mL | Freq: Once | INTRAVENOUS | Status: AC | PRN
  Administered 2020-05-15: 18 mL via INTRAVENOUS

## 2020-05-17 ENCOUNTER — Ambulatory Visit (HOSPITAL_COMMUNITY)
Admission: RE | Admit: 2020-05-17 | Discharge: 2020-05-17 | Disposition: A | Discharge status: Home / Self Care | Payer: HMO | Attending: Gastroenterology | Admitting: Gastroenterology

## 2020-05-17 ENCOUNTER — Other Ambulatory Visit: Payer: Self-pay

## 2020-05-17 ENCOUNTER — Telehealth: Payer: Self-pay

## 2020-05-17 ENCOUNTER — Ambulatory Visit (HOSPITAL_COMMUNITY): Payer: HMO | Admitting: Anesthesiology

## 2020-05-17 ENCOUNTER — Encounter (HOSPITAL_COMMUNITY): Payer: Self-pay | Admitting: Gastroenterology

## 2020-05-17 ENCOUNTER — Encounter (HOSPITAL_COMMUNITY)
Admission: RE | Disposition: A | Discharge status: Home / Self Care | Payer: Self-pay | Source: Home / Self Care | Attending: Gastroenterology

## 2020-05-17 DIAGNOSIS — Z86010 Personal history of colonic polyps: Secondary | ICD-10-CM | POA: Insufficient documentation

## 2020-05-17 DIAGNOSIS — K449 Diaphragmatic hernia without obstruction or gangrene: Secondary | ICD-10-CM | POA: Diagnosis not present

## 2020-05-17 DIAGNOSIS — C180 Malignant neoplasm of cecum: Secondary | ICD-10-CM | POA: Insufficient documentation

## 2020-05-17 DIAGNOSIS — Z79899 Other long term (current) drug therapy: Secondary | ICD-10-CM | POA: Diagnosis not present

## 2020-05-17 DIAGNOSIS — K317 Polyp of stomach and duodenum: Secondary | ICD-10-CM | POA: Diagnosis not present

## 2020-05-17 DIAGNOSIS — D120 Benign neoplasm of cecum: Secondary | ICD-10-CM | POA: Diagnosis not present

## 2020-05-17 DIAGNOSIS — K635 Polyp of colon: Secondary | ICD-10-CM | POA: Diagnosis not present

## 2020-05-17 DIAGNOSIS — K5730 Diverticulosis of large intestine without perforation or abscess without bleeding: Secondary | ICD-10-CM | POA: Insufficient documentation

## 2020-05-17 DIAGNOSIS — K644 Residual hemorrhoidal skin tags: Secondary | ICD-10-CM | POA: Diagnosis not present

## 2020-05-17 HISTORY — PX: ESOPHAGOGASTRODUODENOSCOPY (EGD) WITH PROPOFOL: SHX5813

## 2020-05-17 HISTORY — PX: BIOPSY: SHX5522

## 2020-05-17 HISTORY — PX: POLYPECTOMY: SHX5525

## 2020-05-17 HISTORY — PX: HEMOSTASIS CLIP PLACEMENT: SHX6857

## 2020-05-17 HISTORY — PX: COLONOSCOPY WITH PROPOFOL: SHX5780

## 2020-05-17 LAB — GLUCOSE, CAPILLARY: Glucose-Capillary: 129 mg/dL — ABNORMAL HIGH (ref 70–99)

## 2020-05-17 SURGERY — ESOPHAGOGASTRODUODENOSCOPY (EGD) WITH PROPOFOL
Anesthesia: Monitor Anesthesia Care

## 2020-05-17 MED ORDER — PROPOFOL 500 MG/50ML IV EMUL
INTRAVENOUS | Status: DC | PRN
  Administered 2020-05-17: 125 ug/kg/min via INTRAVENOUS

## 2020-05-17 MED ORDER — PROPOFOL 10 MG/ML IV BOLUS
INTRAVENOUS | Status: DC | PRN
  Administered 2020-05-17 (×2): 20 mg via INTRAVENOUS

## 2020-05-17 MED ORDER — LACTATED RINGERS IV SOLN: INTRAVENOUS | Status: DC | PRN

## 2020-05-17 MED ORDER — LIDOCAINE 2% (20 MG/ML) 5 ML SYRINGE
INTRAMUSCULAR | Status: DC | PRN
  Administered 2020-05-17 (×2): 80 mg via INTRAVENOUS

## 2020-05-17 SURGICAL SUPPLY — 24 items

## 2020-05-17 NOTE — Op Note (Signed)
[facility] Patient Name: Donald [name] Procedure Date: 05/17/2020 MRN: [id number] Attending MD: Clarene Essex , MD Date of Birth: 1950-11-18 CSN: [id number] Age: 70 Admit Type: Outpatient Procedure:                Colonoscopy Indications:              High risk colon cancer surveillance: Personal                            history of colonic polyps, Last colonoscopy:                            September 2020 Providers:                Clarene Essex, MD, Cleda Daub, RN, Tyrone Apple, Technician, [facility], CRNA Referring MD:              Medicines:                Propofol total dose [id number] mg IV Complications:            No immediate complications. Estimated Blood Loss:     Estimated blood loss: none. Procedure:                Pre-Anesthesia Assessment:                           - Prior to the procedure, a History and Physical                            was performed, and patient medications and                            allergies were reviewed. The patient's tolerance of                            previous anesthesia was also reviewed. The risks                            and benefits of the procedure and the sedation                            options and risks were discussed with the patient.                            All questions were answered, and informed consent                            was obtained. Prior Anticoagulants: The patient has                            taken no previous anticoagulant or antiplatelet  agents except for aspirin. ASA Grade Assessment: II                            - A patient with mild systemic disease. After                            reviewing the risks and benefits, the patient was                            deemed in satisfactory condition to undergo the                            procedure.                           After obtaining informed consent, the  colonoscope                            was passed under direct vision. Throughout the                            procedure, the patient's blood pressure, pulse, and                            oxygen saturations were monitored continuously. The                            [id number] Olympus pediatric colonscope                            was introduced through the anus and advanced to the                            the cecum, identified by appendiceal orifice and                            ileocecal valve. The ileocecal valve, appendiceal                            orifice, and rectum were photographed. The                            colonoscopy was performed without difficulty. The                            patient tolerated the procedure well. The quality                            of the bowel preparation was adequate after a                            moderate amount of washing and suction. Scope In: 11:12:05 AM Scope Out: 11:34:41 AM Scope Withdrawal Time: 0 hours 16 minutes 20 seconds  Total Procedure Duration: 0 hours  22 minutes 36 seconds  Findings:      External hemorrhoids were found during retroflexion, during perianal       exam and during digital exam. The hemorrhoids were small.      Multiple small and large-mouthed diverticula were found in the sigmoid       colon.      Scattered small-mouthed diverticula were found in the descending colon       and distal transverse colon.      A small polyp was found in the ileocecal valve. The polyp was       semi-sessile. Biopsies were taken with a cold forceps for histology.      The exam was otherwise without abnormality with a good look at the       proximal transverse and no obvious residual polyp. Impression:               - External hemorrhoids.                           - Diverticulosis in the sigmoid colon.                           - Diverticulosis in the descending colon and in the                            distal  transverse colon.                           - One small polyp at the ileocecal valve. Biopsied.                           - The examination was otherwise normal. Moderate Sedation:      Not Applicable - Patient had care per Anesthesia. Recommendation:           - Patient has a contact number available for                            emergencies. The signs and symptoms of potential                            delayed complications were discussed with the                            patient. Return to normal activities tomorrow.                            Written discharge instructions were provided to the                            patient.                           - Soft diet today.                           - No aspirin, ibuprofen, naproxen, or other  non-steroidal anti-inflammatory drugs for 10 days                            after polyp removal.                           - Await pathology results.                           - Repeat colonoscopy in probably 3 years for                            surveillance based on pathology results and                            previous polypectomy results.                           - Return to GI office PRN.                           - Telephone GI clinic for pathology results in 1                            week.                           - Telephone GI clinic if symptomatic PRN. Procedure Code(s):        --- Professional ---                           [phone], Colonoscopy, flexible; with biopsy, single                            or multiple Diagnosis Code(s):        --- Professional ---                           Z86.010, Personal history of colonic polyps                           K63.5, Polyp of colon                           K57.30, Diverticulosis of large intestine without                            perforation or abscess without bleeding CPT copyright 2019 American Medical Association. All rights reserved. The codes  documented in this report are preliminary and upon coder review may  be revised to meet current compliance requirements. Clarene Essex, MD 05/17/2020 12:02:10 PM This report has been signed electronically. Number of Addenda: 0

## 2020-05-17 NOTE — Telephone Encounter (Signed)
-----   Message from Larey Seat, MD sent at 05/17/2020 11:15 AM EST ----- This MRI of the brain with and without contrast shows the following: 1.   Scattered T2/FLAIR hyperintense foci in the hemispheres and pons consistent with mild chronic microvascular ischemic change, unchanged compared to the 03/16/2020 MRI. 2.   Mild generalized cortical atrophy, unchanged compared to the previous MRI. 3.   Dolichoectasia of the vertebral, basilar, internal carotid and middle cerebral arteries, unchanged in appearance compared to the previous MRI. 4.   Minimal left mastoid effusion, improved compared to the previous MRI.  No acute findings, no progression of previous findings.  The brain has a normal enhancement pattern.

## 2020-05-17 NOTE — Telephone Encounter (Signed)
I called pt. I discussed his MRI results and recommendations. Pt will follow up as scheduled. Pt verbalized understanding of results. Pt had no questions at this time but was encouraged to call back if questions arise.

## 2020-05-17 NOTE — Anesthesia Procedure Notes (Signed)
Performed by: Brealynn Contino D, CRNA Oxygen Delivery Method: Simple face mask       

## 2020-05-17 NOTE — Op Note (Signed)
[facility] Patient Name: Donald [name] Procedure Date: 05/17/2020 MRN: [id number] Attending MD: Clarene Essex , MD Date of Birth: March 19, 1951 CSN: [id number] Age: 70 Admit Type: Outpatient Procedure:                Upper GI endoscopy Indications:              For therapy of gastric polyps Providers:                Clarene Essex, MD, Cleda Daub, RN, Tyrone Apple, Technician, [facility], CRNA Referring MD:              Medicines:                Propofol total dose 400 mg IV, 100 mg IV lidocaine Complications:            No immediate complications. Estimated blood loss:                            Minimal., Minor bleeding - stopped spontaneously,                            treated locally Estimated Blood Loss:     Estimated blood loss was minimal. Procedure:                Pre-Anesthesia Assessment:                           - Prior to the procedure, a History and Physical                            was performed, and patient medications and                            allergies were reviewed. The patient's tolerance of                            previous anesthesia was also reviewed. The risks                            and benefits of the procedure and the sedation                            options and risks were discussed with the patient.                            All questions were answered, and informed consent                            was obtained. Prior Anticoagulants: The patient has                            taken no previous anticoagulant or antiplatelet  agents except for aspirin. ASA Grade Assessment: II                            - A patient with mild systemic disease. After                            reviewing the risks and benefits, the patient was                            deemed in satisfactory condition to undergo the                            procedure.                            After obtaining informed consent, the endoscope was                            passed under direct vision. Throughout the                            procedure, the patient's blood pressure, pulse, and                            oxygen saturations were monitored continuously. The                            [id number]) was introduced through the                            mouth, and advanced to the third part of duodenum.                            The upper GI endoscopy was accomplished without                            difficulty. The patient tolerated the procedure                            well. Scope In: Scope Out: Findings:      The larynx was normal.      A small hiatal hernia was present.      Two small semi-sessile polyps with no bleeding and no stigmata of recent       bleeding were found in the gastric antrum. The polyp was removed with a       hot snare. Resection and retrieval were complete.      Three medium pedunculated and semi-sessile polyps with no bleeding and       no stigmata of recent bleeding were found in the gastric body. The polyp       was removed with a hot snare. Resection and retrieval were complete. For       hemostasis, two hemostatic clips were used for some post polypectomy       oozing of the first gastric body polypectomy site and one was  successfully placed (MR conditional). There was no bleeding at the end       of the procedure. We did remove the 3 largest polyp however we may have       only recovered two but there were multiple small in 1 or 2 other medium       large polyps remaining      The duodenal bulb, first portion of the duodenum, second portion of the       duodenum and third portion of the duodenum were normal.      The exam was otherwise without abnormality. Impression:               - Normal larynx.                           - Small hiatal hernia.                           - Two gastric polyps. Resected and retrieved.                            - Three gastric polyps. Resected and at least 2                            retrieved. 1 clips (MR conditional) was placed and                            the other fell off.                           - Normal duodenal bulb, first portion of the                            duodenum, second portion of the duodenum and third                            portion of the duodenum.                           - The examination was otherwise normal. Moderate Sedation:      Not Applicable - Patient had care per Anesthesia. Recommendation:           - Patient has a contact number available for                            emergencies. The signs and symptoms of potential                            delayed complications were discussed with the                            patient. Return to normal activities tomorrow.                            Written discharge instructions were provided to the  patient.                           - Soft diet today.                           - No aspirin, ibuprofen, naproxen, or other                            non-steroidal anti-inflammatory drugs for 10 days                            after polyp removal.                           - Await pathology results. Repeat endoscopy pending                            pathology                           - Return to GI clinic PRN.                           - Telephone GI clinic for pathology results in 1                            week.                           - Telephone GI clinic if symptomatic PRN.                           - Perform a colonoscopy today. Procedure Code(s):        --- Professional ---                           [phone], 59, Esophagogastroduodenoscopy, flexible,                            transoral; with control of bleeding, any method                           43251, Esophagogastroduodenoscopy, flexible,                            transoral; with removal of tumor(s),  polyp(s), or                            other lesion(s) by snare technique Diagnosis Code(s):        --- Professional ---                           K44.9, Diaphragmatic hernia without obstruction or                            gangrene  K31.7, Polyp of stomach and duodenum CPT copyright 2019 [employer]. All rights reserved. The codes documented in this report are preliminary and upon coder review may  be revised to meet current compliance requirements. Clarene Essex, MD 05/17/2020 11:55:00 AM This report has been signed electronically. Number of Addenda: 0

## 2020-05-17 NOTE — Progress Notes (Signed)
This MRI of the brain with and without contrast shows the following: 1.   Scattered T2/FLAIR hyperintense foci in the hemispheres and pons consistent with mild chronic microvascular ischemic change, unchanged compared to the 03/16/2020 MRI. 2.   Mild generalized cortical atrophy, unchanged compared to the previous MRI. 3.   Dolichoectasia of the vertebral, basilar, internal carotid and middle cerebral arteries, unchanged in appearance compared to the previous MRI. 4.   Minimal left mastoid effusion, improved compared to the previous MRI.  No acute findings, no progression of previous findings.  The brain has a normal enhancement pattern.

## 2020-05-17 NOTE — Progress Notes (Signed)
Donald [name] 10:04 AM  Subjective: Patient without any GI complaints and no problems since we saw him recently in the office and we rediscussed his procedure  Objective: Vital signs stable afebrile no acute distress exam please see preassessment evaluation  Assessment: Patient with both stomach and colon polyps difficult to remove  Plan: Okay to proceed with endoscopy and colonoscopy with anesthesia assistance  [facility]  office [phone] After 5PM or if no answer call [phone]

## 2020-05-17 NOTE — Transfer of Care (Signed)
Immediate Anesthesia Transfer of Care Note  Patient: Donald [name]  Procedure(s) Performed: ESOPHAGOGASTRODUODENOSCOPY (EGD) WITH PROPOFOL (N/A ) COLONOSCOPY WITH PROPOFOL (N/A ) POLYPECTOMY HEMOSTASIS CLIP PLACEMENT BIOPSY  Patient Location: PACU  Anesthesia Type:MAC  Level of Consciousness: awake, alert  and oriented  Airway & Oxygen Therapy: Patient Spontanous Breathing and Patient connected to face mask oxygen  Post-op Assessment: Report given to RN and Post -op Vital signs reviewed and stable  Post vital signs: Reviewed and stable  Last Vitals:  Vitals Value Taken Time  BP    Temp    Pulse    Resp    SpO2      Last Pain:  Vitals:   05/17/20 0932  TempSrc: Oral  PainSc: 0-No pain         Complications: No complications documented.

## 2020-05-17 NOTE — Anesthesia Preprocedure Evaluation (Signed)
Anesthesia Evaluation  Patient identified by MRN, date of birth, ID band Patient awake    Reviewed: NPO status , Patient's Chart, lab work & pertinent test results  Airway Mallampati: I  TM Distance: >3 FB Neck ROM: Full    Dental  (+) Teeth Intact   Pulmonary asthma , sleep apnea, Continuous Positive Airway Pressure Ventilation and Oxygen sleep apnea ,    Pulmonary exam normal        Cardiovascular hypertension, Pt. on medications and Pt. on home beta blockers  Rhythm:Regular Rate:Normal     Neuro/Psych negative neurological ROS  negative psych ROS   GI/Hepatic Neg liver ROS, GERD  Medicated and Controlled,Hx of colon polyps   Endo/Other  diabetes, Well Controlled, Type 2, Oral Hypoglycemic Agents, Insulin Dependent  Renal/GU   negative genitourinary   Musculoskeletal  (+) Arthritis ,   Abdominal (+)   Bowel sounds: normal.  Peds  Hematology negative hematology ROS (+)   Anesthesia Other Findings   Reproductive/Obstetrics                             Anesthesia Physical Anesthesia Plan  ASA: III  Anesthesia Plan: MAC   Post-op Pain Management:    Induction: Intravenous  PONV Risk Score and Plan: 1 and Propofol infusion  Airway Management Planned: Simple Face Mask, Natural Airway and Nasal Cannula  Additional Equipment: None  Intra-op Plan:   Post-operative Plan:   Informed Consent: I have reviewed the patients History and Physical, chart, labs and discussed the procedure including the risks, benefits and alternatives for the proposed anesthesia with the patient or authorized representative who has indicated his/her understanding and acceptance.     Dental advisory given  Plan Discussed with: CRNA  Anesthesia Plan Comments:         Anesthesia Quick Evaluation

## 2020-05-18 ENCOUNTER — Encounter (HOSPITAL_COMMUNITY): Payer: Self-pay | Admitting: Gastroenterology

## 2020-05-18 LAB — SURGICAL PATHOLOGY

## 2020-05-18 NOTE — Anesthesia Postprocedure Evaluation (Signed)
Anesthesia Post Note  Patient: Donald [name]  Procedure(s) Performed: ESOPHAGOGASTRODUODENOSCOPY (EGD) WITH PROPOFOL (N/A ) COLONOSCOPY WITH PROPOFOL (N/A ) POLYPECTOMY HEMOSTASIS CLIP PLACEMENT BIOPSY     Patient location during evaluation: PACU Anesthesia Type: MAC Level of consciousness: awake and alert Pain management: pain level controlled Vital Signs Assessment: post-procedure vital signs reviewed and stable Respiratory status: spontaneous breathing, nonlabored ventilation, respiratory function stable and patient connected to nasal cannula oxygen Cardiovascular status: stable and blood pressure returned to baseline Postop Assessment: no apparent nausea or vomiting Anesthetic complications: no   No complications documented.  Last Vitals:  Vitals:   05/17/20 1200 05/17/20 1210  BP: (!) 153/96 (!) 143/88  Pulse: 66 65  Resp: 14 (!) 21  Temp:    SpO2: 92% (!) 89%    Last Pain:  Vitals:   05/17/20 1210  TempSrc:   PainSc: 0-No pain                 Belenda Cruise P Bradin Mcadory

## 2020-06-29 DIAGNOSIS — K219 Gastro-esophageal reflux disease without esophagitis: Secondary | ICD-10-CM | POA: Diagnosis not present

## 2020-06-29 DIAGNOSIS — N059 Unspecified nephritic syndrome with unspecified morphologic changes: Secondary | ICD-10-CM | POA: Diagnosis not present

## 2020-06-29 DIAGNOSIS — Z794 Long term (current) use of insulin: Secondary | ICD-10-CM | POA: Diagnosis not present

## 2020-06-29 DIAGNOSIS — R413 Other amnesia: Secondary | ICD-10-CM | POA: Diagnosis not present

## 2020-06-29 DIAGNOSIS — M170 Bilateral primary osteoarthritis of knee: Secondary | ICD-10-CM | POA: Diagnosis not present

## 2020-06-29 DIAGNOSIS — E118 Type 2 diabetes mellitus with unspecified complications: Secondary | ICD-10-CM | POA: Diagnosis not present

## 2020-06-29 DIAGNOSIS — R748 Abnormal levels of other serum enzymes: Secondary | ICD-10-CM | POA: Diagnosis not present

## 2020-06-29 DIAGNOSIS — N182 Chronic kidney disease, stage 2 (mild): Secondary | ICD-10-CM | POA: Diagnosis not present

## 2020-06-29 DIAGNOSIS — I129 Hypertensive chronic kidney disease with stage 1 through stage 4 chronic kidney disease, or unspecified chronic kidney disease: Secondary | ICD-10-CM | POA: Diagnosis not present

## 2020-06-29 DIAGNOSIS — F39 Unspecified mood [affective] disorder: Secondary | ICD-10-CM | POA: Diagnosis not present

## 2020-06-29 DIAGNOSIS — E785 Hyperlipidemia, unspecified: Secondary | ICD-10-CM | POA: Diagnosis not present

## 2020-06-29 DIAGNOSIS — R2681 Unsteadiness on feet: Secondary | ICD-10-CM | POA: Diagnosis not present

## 2020-07-05 ENCOUNTER — Telehealth: Payer: Self-pay | Admitting: Neurology

## 2020-07-05 DIAGNOSIS — G3184 Mild cognitive impairment, so stated: Secondary | ICD-10-CM

## 2020-07-05 NOTE — Telephone Encounter (Signed)
Order has been re entered for the patient for referral for neuro psych eval.

## 2020-07-05 NOTE — Addendum Note (Signed)
Addended by: Darleen Crocker on: 07/05/2020 10:17 AM   Modules accepted: Orders

## 2020-07-05 NOTE — Telephone Encounter (Signed)
Noted thanks Myriam Jacobson . Referral sent .

## 2020-07-05 NOTE — Telephone Encounter (Signed)
[facility]   Hi Donald [name] can you put in a another for this patient when referral was put it . Went to [facility]    . I am going to call Donald [name] at Dr. Sima Matas to see if we can get patient a quick apt. [phone] - Opt . [phone]   Patient wife can be contacted for scheduling  Donald [name] - [place] his up coming apt with Dr. Brett Fairy  because he had not been sch. for Neuro PSY .   I hope that was ok .   Donald [name] spoke to patient's wife she is aware of details . I will let you know when he is scheduled . Thanks Hinton Dyer

## 2020-07-13 ENCOUNTER — Encounter: Payer: Self-pay | Admitting: Psychology

## 2020-07-15 ENCOUNTER — Ambulatory Visit: Payer: Medicare HMO | Admitting: Neurology

## 2020-07-17 DIAGNOSIS — R413 Other amnesia: Secondary | ICD-10-CM | POA: Insufficient documentation

## 2020-07-17 DIAGNOSIS — N3001 Acute cystitis with hematuria: Secondary | ICD-10-CM | POA: Diagnosis not present

## 2020-07-30 DIAGNOSIS — N390 Urinary tract infection, site not specified: Secondary | ICD-10-CM | POA: Diagnosis not present

## 2020-08-02 ENCOUNTER — Encounter: Payer: HMO | Attending: Psychology | Admitting: Psychology

## 2020-08-02 ENCOUNTER — Other Ambulatory Visit: Payer: Self-pay

## 2020-08-02 ENCOUNTER — Encounter: Payer: Self-pay | Admitting: Psychology

## 2020-08-02 DIAGNOSIS — G3184 Mild cognitive impairment, so stated: Secondary | ICD-10-CM | POA: Diagnosis not present

## 2020-08-02 NOTE — Progress Notes (Signed)
NEUROBEHAVIORAL STATUS EXAM   Name: Donald [name] Date of Birth: [birth year] Date of Interview: 08/02/2020  Reason for Referral:  Donald [name] is a 70 y.o. male who is referred for neuropsychological evaluation by Dr. Maureen Chatters at [facility] due to concerns about memory. This patient is accompanied in the office by his wife who supplements the history.  History of Presenting Problem:  Donald [name] is a 70 y.o.,Caucasian, married, male with 14 years of education who presents for neuropsychological consultation due to concern about memory decline in the context of mild OSA (compliant with CPAP) and severe Hypoxemia (2015). 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  GERD, heart murmur, hematuria, hypercholesterolemia and hypertension, history of prostatitis, community-acquired pneumonia.  Coronary artery disease evaluation was negative with an EF of 55% in 2013.  Review of Records: According to records, he had first sleep study on 10/27/11 and given an oxygen concentrator; oxygen first delivered 3 months prior after overnight pulse oximetry. He participated in another sleep evaluation (w/sleep study) with Dr. Brett Fairy at [facility] circa 04/2013. Sleep study showed an overall AHI of only 5.7/h, and very mild degree of apnea but his oxygen nadir was 77% and he spent sustained time in low oxygen.  Total hypoxemia time at 89 and below was 2 hours and 45 minutes. Oxygen for hypoxemia with exercise was ordered. CPAP titration followed in Feburary, 2015. AHI dropped to 1.9 /h. Sleep latency was 65 minutes in the sleep lab.  According to Dr. Brett Fairy, patient's sleep habits include the following: No longer has oxygen available but uses CPAP at 9 cm water pressure (55+ years old  and still using nasal pillows).  He describes a dinnertime at around 7 PM bedtime is usually around 9:30 PM always with CPAP, the bedroom is described as cool, quiet and dark he shares a bedroom with his wife.  He falls asleep usually within 15 minutes he has begun trying to sleep supine which for an apnea patient is not a preferred sleep position but he uses 3 pillows under wedge to keep himself propped up.   He wakes up 2-3 times each night for nocturia and finally rises at 8 AM but only with alarm.  He does not feel refreshed and restored spontaneously and without alarm would be sleeping until noon.  He has sometimes been able to fall asleep in a recliner when he is reading or relaxed.  He has isolated occurrences of sleep paralysis.    Patient's spouse is concerned about his short-term memory function and rapid forgetting. She describes his mind as more and more foggy and notices some confusion while driving. She also has noticed persistent headaches that typically occur after sleeping on left side with pressure behind eyes.   On 04/15/20, he scored a 26/30 on the MoCA; 2/5 on delayed recall. Neurological exam during this visit showed the following:    Attention: Attention span &concentration ability appears restricted.   Speech is fluent, without dysarthria, dysphonia or aphasia.   Mood and affectare here normal, but wife reports episode of inappropriate anger, blunt comments.   Cranial nerves:no loss of smell or taste reported  Pupils are equal and briskly reactive to light. Funduscopic examdeferred. Extraocular movements in vertical and horizontal planes were intact and  without nystagmus.  No Diplopia.Visual fields by finger perimetry are intact. Hearing  was intact to soft voice and finger rubbing. Facial sensation intact to fine touch. Facial  motor strength is symmetric and tongue and uvula move midline. Neck ROM: rotation, tilt and flexion extension were normal for age and ligament  d amage-shoulder shrug was symmetrical- but he reports shoulder pain, arthritis, having lost arm swing.   Motor exam:Symmetric bulk, tone and ROM. Normal tone without cog- wheeling, symmetric grip strength .  Sensory: Fine touch and vibration were lost in both feet /ankles. Proprioception tested in the upper extremities was normal.  Coordination: Rapid alternating movements in the fingers/hands were slowed. The Finger-to-nose maneuver was intact without evidence of ataxia,  dysmetria or tremor.  Gait and station:Patient could rise unassisted from a seated position, walked without assistive device. Stance is of normal width/base and the  patient turned with 4 steps.  He walked with reduced arm swing on the left versus right. Toe and heel walk were deferred.   Deep tendon reflexes:in the upper and lower extremities are symmetric and trace.  Clinical Interview: Reports primary difficulties with computation and math, "filtering what/how things are said", remembering dates, times, and recent conversations, "keeping track of a list", and brief periods of "feeling lost" Short term memory loss causes him to forget conversations and details from events of late.  He zones out and loses time.  He needs multiple compensatory strategies to remember things (e.g., calender to track appointments, turns pill bottles upside down after taking them). Depends on GPS for driving as he has trouble remembering even familiar routes (e.g., has to think more about destination).  He describes remote memory as good". Decreased attention and concentration were endorsed and he tends to lose his train of thought.  His speed of thinking is slow and he describes feeling foggy headed".  Analytical thinking, multitasking abilities and problem-solving skills are diminished, and organization and planning are problematic. Patient reports trouble "picturing concepts" in mind that interferes with his ability to program computers and perform  even well learned operations. He also mentions trouble with receptive language   Exercises with a personal trainer 2 x week that reportedly helps with muscle aches and leg/hip pain.   Stopped riding motorcycle   Onset/Course: Memory within the past year. Mood and behavioral change around 2 years ago. Falls and balance issues within the last 1-2 years.    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   Complex ADLs Driving: Able to perform with GPS. Trouble navigating within the last year.  Medication management: Able to perform independently with compensatory strategy  Management of finances: Depends on wife.  Appointments: Able to manage with calender  Cooking: Able to cook basic meals but wife does most of the cooking.   Medical/Physical complaints:  Any hx of stroke/TIA, MI, LOC/TBI, Sz? Denies  Hx falls? [place] on head/ forehead and "bounced on the concrete"; denies LOC or any concussion symptoms. Fell backwards 6 weeks later going down stairs.  Balance, probs walking? Endorsed. Feels more unstable on feet. New Balance shoes help some. Walking slower in the past 2-3 years. Pronates inward.  Sleep: Insomnia? OSA? CPAP? REM sleep beh sx? Wake up 2-3 times to urinate during the night. Taking several naps during the day but denies excessive daytime sleepiness. Denies feeling refreshed or restored in mornings. Visual illusions/hallucinations? Denies   Appetite/Nutrition/Weight changes: Denies  Adds crystal light to water and Splenda to unsweat tea. Drinks a variety of juices (e.g., apple, mango, cranberry, etc.). Eats banana  and peanut bbutter sandwich for lunch. Eats red meat most evenings and fish on occasion.   Current mood: Depressed  Behavioral disturbance/Personality change: More easily frustrated in the last year. Described as having poor judgement and making blunt comments without filter. He is increasingly irritable and agitated as well as  short tempered; this leads to verbal outbursts.  He has less patience when interacting with granddaughter and frequently interrupts her after a few minutes of talking. He gets angery over trivial matters and comes across as more stern than he typically would be. Wife describes this is a significant change as he " used to be "the more relaxed one" in their marriage.    Wife first noticed patient making inappropriate comments around 2 years ago. She is not aware of any vulgar (e.g., foul language) or sexually inappropriate comments or behavior. Describes patient as more sarcastic and being a "smart alec".   Suicidal Ideation/Intention: Denies   Psychiatric History: History of depression, anxiety, other MH disorder: History of MH treatment: Started on Wellbutrin around 2010 and then added Cymbalta circa 2014.  Attended around 4-5 therapy sessions within the past year. 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 Allergic rhinitis, seasonal   . Asthma   . Bronchitis   . Chronic kidney disease, stage I   . collar bone    dislocation left side 05/2014  . Degenerative joint disease of knee    bilateral  . Diabetes mellitus   . GERD (gastroesophageal reflux disease)   . Heart murmur    history of  . History of acute prostatitis   . History of hematuria   . Hyperlipidemia   . Hypertension   . IgA nephropathy   . OSA on CPAP   . Pneumonia   . Sinusitis    Imaging: No acute findings on MRI (03/16/20).  Age-related volume loss was commented on mild chronic small vessel ischemic changes,  tortuous and ectatic vessels were noted but no aneurysm or calcification or stenosis.  Left mastoid effusion was also mentioned.    MRI of the brain with and without contrast on 05/17/20 showed the following:  1.  Scattered T2/FLAIR hyperintense foci in the hemispheres and pons consistent with mild chronic microvascular ischemic change, unchanged compared to the 03/16/2020 MRI. 2.  Mild generalized cortical atrophy, unchanged compared to the previous MRI. 3.  Dolichoectasia of the vertebral, basilar, internal carotid and middle cerebral arteries, unchanged in appearance compared to the previous MRI. 4.  Minimal left mastoid effusion, improved compared to the previous MRI.  Current Medications:  Outpatient Encounter Medications as of 08/02/2020  Medication Sig  . albuterol (PROVENTIL HFA;VENTOLIN HFA) 108 (90 BASE) MCG/ACT inhaler Inhale 2 puffs into the lungs every 6 (six) hours as needed for wheezing or shortness of breath. For shortness of breath  . Ascorbic Acid (VITAMIN C PO) Take 1,000 mg by mouth daily.  Marland Kitchen aspirin EC 81 MG tablet Take 81 mg by mouth every evening.   . chlorpheniramine (CHLOR-TRIMETON) 4 MG tablet Take 4 mg by mouth at bedtime as needed (Sinus drainage).  . Cyanocobalamin (VITAMIN B12) 500 MCG TABS Take 500 mcg by mouth daily.  Marland Kitchen  diltiazem (CARDIZEM CD) 240 MG 24 hr capsule Take 240 mg by mouth daily.  Marland Kitchen donepezil (ARICEPT) 5 MG tablet Take 1 tablet (5 mg total) by mouth at bedtime.  . DULoxetine HCl 60 MG CSDR Take 60 mg by mouth daily.  Marland Kitchen ibuprofen (ADVIL,MOTRIN) 200 MG tablet Take 200 mg by mouth daily as needed for headache.  . Insulin Degludec (TRESIBA [place]) Inject 80 Units into the skin daily.  . irbesartan-hydrochlorothiazide (AVALIDE) 300-12.5 MG tablet Take 1 tablet by mouth every evening.   . metFORMIN (GLUCOPHAGE) 1000 MG tablet Take 1 tablet (1,000 mg total) by mouth 2 (two) times daily with a meal. Restart on 8/22 (Patient taking differently: Take 1,000 mg by mouth  2 (two) times daily with a meal.)  . metoprolol succinate (TOPROL-XL) 50 MG 24 hr tablet Take 50 mg by mouth 2 (two) times daily. Take with or immediately following a meal.  . NOVOLOG FLEXPEN 100 UNIT/ML FlexPen Inject 40-60 Units into the skin 3 (three) times daily before meals. Sliding scale  . Omega-3 Fatty Acids (FISH OIL) 1000 MG CPDR Take 2,000 Units by mouth daily.  . pantoprazole (PROTONIX) 40 MG tablet Take 40 mg by mouth daily.  . pravastatin (PRAVACHOL) 40 MG tablet Take 40 mg by mouth every evening.    No facility-administered encounter medications on file as of 08/02/2020.   Family History: His father died at age 60 of coronary artery disease and also had type II diabetes  and degenerative joint joint disease.  His mother died at 56. She had a history of nephrolithiasis and coronary artery disease.  His sister (6 years younger) has thyroid problems and underwent bariatric surgery for morbid obesity.  Behavioral Observations:   Appearance: Casually dressed and slightly unkempt.  Gait: Ambulated independently, no gross abnormalities observed.  Speech: Fluent; normal rate, rhythm and volume. Minimal word finding difficulty. Thought process: Linear, goal directed. Affect: Flattened Interpersonal: Polite and appropriate  60 minutes spent face-to-face with patient completing neurobehavioral status exam. 60 minutes spent integrating medical records/clinical data and completing this report. [id number] unit; [id number].  TESTING: There is medical necessity to proceed with neuropsychological assessment as the results will be used to aid in differential diagnosis and clinical decision-making and to inform specific treatment recommendations. Per the patient, his wife, and medical records reviewed, there has been a change in cognitive functioning and a reasonable suspicion of a neurocognitive disorder.  Clinical Decision Making: In considering the patient's current level of functioning, level of  presumed impairment, nature of symptoms, emotional and behavioral responses during the interview, level of literacy, and observed level of motivation, a battery of tests was selected for patient to complete during a separate 4-hour testing appointment.     PLAN: The patient will return to complete the above referenced full battery of neuropsychological testing with this provider. Education regarding testing procedures was provided to the patient.   The patient was provided an opportunity to ask questions and all were answered. The patient agreed with the plan and demonstrated adequate understanding of the purpose.  Subsequently, the patient will see this provider for a follow-up session at which time his test performances and my impressions and treatment recommendations will be reviewed in detail.   Evaluation ongoing; full report to follow.

## 2020-08-11 DIAGNOSIS — Z794 Long term (current) use of insulin: Secondary | ICD-10-CM | POA: Diagnosis not present

## 2020-08-11 DIAGNOSIS — N182 Chronic kidney disease, stage 2 (mild): Secondary | ICD-10-CM | POA: Diagnosis not present

## 2020-08-11 DIAGNOSIS — I129 Hypertensive chronic kidney disease with stage 1 through stage 4 chronic kidney disease, or unspecified chronic kidney disease: Secondary | ICD-10-CM | POA: Diagnosis not present

## 2020-08-11 DIAGNOSIS — E785 Hyperlipidemia, unspecified: Secondary | ICD-10-CM | POA: Diagnosis not present

## 2020-08-11 DIAGNOSIS — E118 Type 2 diabetes mellitus with unspecified complications: Secondary | ICD-10-CM | POA: Diagnosis not present

## 2020-08-16 ENCOUNTER — Telehealth: Payer: Self-pay | Admitting: Neurology

## 2020-08-16 DIAGNOSIS — R310 Gross hematuria: Secondary | ICD-10-CM | POA: Diagnosis not present

## 2020-08-16 NOTE — Telephone Encounter (Signed)
Pt's wife called stating that the pt has been more agitated than usual and his memory and depression has gotten worse. She would like to know if he is needing medication adjustment or what can be advised.

## 2020-08-17 NOTE — Telephone Encounter (Signed)
I returned the wife's call.  She stated the patient started antibiotics for UTI on 07/16/2020.  She is aware that a UTI could cause changes in cognitive function/worsen confusion as she has noticed this with her mother however she stated this was not the case for the patient.  He had to do 2 rounds of antibiotics and finished his last dose yesterday.  Just in this past week the wife and the patient have noticed that he has had more confusion, anxiety, is forgetting things more quickly, and has been on edge and angry (for example he has gotten frustrated with the dog and he doesn't normally raise his voice at the dog).  The other day he got in the car and tried to push in a clutch but he has not had a manual car for 2 years.  He could not remember his phone number the other day. He is aware of these changes and states he feels like he could get depressed.  He used to just struggle more-so with dates and times.  They had an initial consult with Dr. Darol Destine on 08/02/20 and the memory testing is scheduled for May which is next available, followed by review of results.  The wife will call to see if there is a sooner opening for testing.  She requested if there was a medication the patient could be put on to help with the anxiety.  She stated the patient is currently on duloxetine for her feet pain and in the past was on Wellbutrin.  After he retired, his stress level was better and he was able to come off the medications however he needed the Cymbalta restarted for feet pain and that does help some.  She also stated the patient is still taking Aricept 5 mg.  For a week he noticed improvement but then not no improvement was noted.  I let her know I would discuss with Dr. Brett Fairy and get back to her tomorrow.  She verbalized appreciation.

## 2020-08-18 MED ORDER — DONEPEZIL HCL 10 MG PO TABS: 10.0000 mg | ORAL_TABLET | Freq: Every day | ORAL | 3 refills | Status: DC

## 2020-08-18 NOTE — Telephone Encounter (Signed)
Spoke with Dr Brett Fairy and discussed wife's concerns and recent UTI. We do need the formal memory testing as planned. Dr Brett Fairy provided v.o. to increase Aricept to 10 mg daily for patient to try. The patient is already on Duloxetine which can help with anxiety/depression and Wellbutrin would not be encouraged due to potential stimulating/anxious side effects.

## 2020-08-18 NOTE — Telephone Encounter (Addendum)
Spoke with patient's wife and discussed Dr. Edwena Felty recommendations as noted below.  I let her know we will send in a new prescription for Aricept 10 mg and for the time being she can give the patient two 5 mg tablets until the new prescription is picked up. She verbalized understanding and appreciation for the call.   Donepezil 5 mg discontinued and donepezil 10 mg PO QHS #30 refills 3 ordered.

## 2020-08-18 NOTE — Addendum Note (Signed)
Addended by: Gildardo Griffes on: 08/18/2020 03:23 PM   Modules accepted: Orders

## 2020-09-03 ENCOUNTER — Ambulatory Visit: Payer: HMO | Admitting: Psychology

## 2020-09-10 ENCOUNTER — Ambulatory Visit: Payer: HMO

## 2020-09-13 ENCOUNTER — Telehealth: Payer: Self-pay | Admitting: Neurology

## 2020-09-13 ENCOUNTER — Other Ambulatory Visit: Payer: Self-pay

## 2020-09-13 ENCOUNTER — Encounter: Payer: HMO | Attending: Psychology

## 2020-09-13 ENCOUNTER — Ambulatory Visit: Payer: HMO | Admitting: Psychology

## 2020-09-13 DIAGNOSIS — Z9989 Dependence on other enabling machines and devices: Secondary | ICD-10-CM | POA: Diagnosis not present

## 2020-09-13 DIAGNOSIS — R0902 Hypoxemia: Secondary | ICD-10-CM | POA: Insufficient documentation

## 2020-09-13 DIAGNOSIS — G4733 Obstructive sleep apnea (adult) (pediatric): Secondary | ICD-10-CM | POA: Diagnosis not present

## 2020-09-13 DIAGNOSIS — G3184 Mild cognitive impairment, so stated: Secondary | ICD-10-CM | POA: Diagnosis not present

## 2020-09-13 DIAGNOSIS — N183 Chronic kidney disease, stage 3 unspecified: Secondary | ICD-10-CM | POA: Insufficient documentation

## 2020-09-13 NOTE — Telephone Encounter (Signed)
Pt's wife Lolly Mustache. on DPR called and LVM needing to speak to the RN regarding the side effects the pt is having on the donepezil (ARICEPT) 10 MG tablet Please advise.

## 2020-09-13 NOTE — Telephone Encounter (Signed)
Called patient's wife back.  Patient had the Aricept increased to 10 mg at bedtime on 08/19/2020.  Since this increase initially he handled it okay.  About a week into taking the medication he developed dizziness, nausea, and became extremely tired.  She states that he would stand up maybe 3 to 4 minutes and have to sit back down due to the nausea.  This feeling has been for the most part daily.  He attempted continuing despite feeling this way hoping the side effects would subside but Friday will be 4 weeks from the medication and 3 out of the 4 weeks he has continued with the same symptoms.  When the patient was on 5 mg once a day the only side effect was he started having moments where he was getting agitated therefore that is why they increased to 10 mg.  Advised the patient's wife that Dr. Brett Fairy is out of office until next week.  Instructed the wife to cut the tablets in half and provide the patient with 5 mg in the morning & 5 mg in the evening to see if he better tolerates the dosage broken half.  Informed if the symptoms still continue reduced to 5 mg.  Instructed the wife to call the office back with any concerns and an update early next week.  She was appreciative for the call back for the advice and will call us if there is any other concerns.

## 2020-09-13 NOTE — Progress Notes (Signed)
   Neuropsychology Note  LINELL SHAWN completed 240 minutes of neuropsychological testing with technician, Dina Rich, BS, under the supervision of Alfonso Ellis, PsyD., Clinical Neuropsychologist. The patient did not appear overtly distressed by the testing session, per behavioral observation or via self-report to the technician. Rest breaks were offered.   Clinical Decision Making: In considering the patient's current level of functioning, level of presumed impairment, nature of symptoms, emotional and behavioral responses during the interview, level of literacy, and observed level of motivation/effort, a battery of tests was selected and communicated to the technician.  Communication between the neuropsychologist and technician was ongoing throughout the testing session and changes were made as deemed necessary based on patient performance on testing, technician observations and additional pertinent factors such as those listed above.  Patient took an extended amount of time completing the core WAIS-IV/WMS-IV battery and did not leave time to complete additional test of language, motor and executive functioning. Patient agreed to return for another 2-hour appointment to complete these tests. This appointment is scheduled for 09/15/20.   Tests Administered: . Wechsler Adult Intelligence Scale, 4th Edition (WAIS-IV) . Wechsler Memory Scale, 4th Edition (WMS-IV); Older Adult Battery   Results: Will be included in final report   Feedback to Patient: HOLGER SOKOLOWSKI will return on for an interactive feedback session with Dr. Darol Destine on 09/23/20 at which time his test performances, clinical impressions and treatment recommendations will be reviewed in detail. The patient understands he can contact our office should he require our assistance before this time.  Full report to follow.  NOTE: Initial consultation progress note in EMR on 08/02/20

## 2020-09-15 ENCOUNTER — Other Ambulatory Visit: Payer: Self-pay

## 2020-09-15 ENCOUNTER — Encounter: Payer: HMO | Admitting: Psychology

## 2020-09-15 ENCOUNTER — Encounter: Payer: Self-pay | Admitting: Psychology

## 2020-09-15 DIAGNOSIS — G3184 Mild cognitive impairment, so stated: Secondary | ICD-10-CM

## 2020-09-15 NOTE — Progress Notes (Signed)
   Neuropsychology Note  TELLY JAWAD completed 120 minutes of neuropsychological testing with this provider. The patient did not appear overtly distressed by the testing session, per behavioral observation or via self-report. Rest breaks were offered.   Clinical Decision Making: In considering the patient's current level of functioning, level of presumed impairment, nature of symptoms, emotional and behavioral responses during the interview, level of literacy, and observed level of motivation/effort, a battery of tests was selected for patient to complete.  Changes were made as deemed necessary based on patient performance on testing, my observations, and additional pertinent factors such as those listed above.  Tests Administered: . [place] (BNT) . Controlled Oral Word Association Test (COWAT; FAS & Animals)  . [place] Executive Functioning System (D-KEFS), Select subtests . Finger Tapping Test (FTT) . Grooved Pegboard . Hand Dynamometer  . Trail Making Test (TMT; Part A & B) . [employer] Test [facility])  Results: Will be included in final report   Feedback to Patient: TREYVEON MOCHIZUKI will return on 09/23/20  for an interactive feedback session with this provider at which time his test performances, clinical impressions and treatment recommendations will be reviewed in detail. The patient understands he can contact our office should he require our assistance before this time.  Full report to follow.  NOTE: Initial consultation progress note in chart on 08/02/20. Progress note from 4-hour testing with technician in EMR on 09/13/20

## 2020-09-16 ENCOUNTER — Encounter (HOSPITAL_BASED_OUTPATIENT_CLINIC_OR_DEPARTMENT_OTHER): Payer: HMO | Admitting: Psychology

## 2020-09-16 DIAGNOSIS — G3184 Mild cognitive impairment, so stated: Secondary | ICD-10-CM

## 2020-09-16 DIAGNOSIS — G4733 Obstructive sleep apnea (adult) (pediatric): Secondary | ICD-10-CM

## 2020-09-16 DIAGNOSIS — N183 Chronic kidney disease, stage 3 unspecified: Secondary | ICD-10-CM

## 2020-09-16 DIAGNOSIS — F0670 Mild neurocognitive disorder due to known physiological condition without behavioral disturbance: Secondary | ICD-10-CM

## 2020-09-16 DIAGNOSIS — Z9989 Dependence on other enabling machines and devices: Secondary | ICD-10-CM | POA: Diagnosis not present

## 2020-09-16 DIAGNOSIS — R0902 Hypoxemia: Secondary | ICD-10-CM | POA: Diagnosis not present

## 2020-09-16 NOTE — Telephone Encounter (Signed)
pts wife called in to stated that the patient has noticed some improvement with breaking the dose in half. They will continue this course for now and update in a week or so.

## 2020-09-20 ENCOUNTER — Ambulatory Visit: Payer: HMO | Admitting: Psychology

## 2020-09-22 ENCOUNTER — Encounter: Payer: Self-pay | Admitting: Psychology

## 2020-09-22 NOTE — Progress Notes (Addendum)
NEUROPSYCHOLOGICAL EVALUATION   Name:    Donald [name]  Date of Birth:   Nov 01, 1950 Date of Interview:  08/02/2020 Date of Testing:  09/13/2020, 09/15/2020  Date of Feedback:  09/23/2020      Background Information:  Reason for Referral:  Donald [name] is a 70 y.o. male referred by Dr. Maureen Chatters at [facility] to assess his current level of cognitive functioning and assist in differential diagnosis. The current evaluation consisted of a review of available medical records, an interview with the patient and wife, and the completion of a neuropsychological testing battery. Informed consent was obtained.  History of Presenting Problem:  Donald [name] is a 70y.o., left handed, Caucasian, married, male with 14 years of education who presents for neuropsychological consultation due to concern about memory decline in the context of mild OSA (compliant with CPAP) and severe Hypoxemia (2015). 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 GERD, heart murmur, hematuria, hypercholesterolemia and hypertension, history of prostatitis, community-acquired pneumonia. Coronary artery disease evaluation was negative with an EF of 55% in 2013.  Review of Records: According to records, he had first sleep study on 10/27/11 and given an oxygen concentrator; oxygen first delivered 3 months prior after overnight pulse oximetry. He participated in another sleep evaluation (w/sleep study) with Dr. Brett Fairy at [facility] circa 04/2013. Sleep study showed an overall AHI of only 5.7/h, and very mild degree of apnea but his oxygen nadir was 77% and he spent sustained time in low oxygen. Total hypoxemia time at 89 and below was 2 hours and 45 minutes. Oxygen for hypoxemia with exercise was ordered.  CPAP titration followed in Feburary, 2015. AHI dropped to 1.9 /h. Sleep latency was 65 minutes in the sleep lab.  According to Dr. Brett Fairy, patient's sleep habits include the following: No longer has oxygen available but uses CPAP at 9 cm water pressure (43+ years old and still using nasal pillows).  He describes a dinnertime at around 7 PM bedtime is usually around 9:30 PM always with CPAP, the bedroom is described as cool, quiet and dark he shares a bedroom with his wife. He falls asleep usually within 15 minutes he has begun trying to sleep supine which for an apnea patient is not a preferred sleep position but he uses 3 pillows under wedge to keep himself propped up. He wakes up 2-3 times each night for nocturia and finally rises at 8 AM but only with alarm. He does not feel refreshed and restored spontaneously and without alarm would be sleeping until noon. He has sometimes been able to fall asleep in a recliner when he is reading or relaxed. He has isolated occurrences of sleep paralysis.   Patient's spouse is concerned about his short-term memory function and rapid forgetting. She describes his mind as more and more foggy and notices some confusion while driving. She also has noticed persistent headaches that typically occur after sleeping on left side with pressure behind eyes.   On 04/15/20, he scored a 26/30 on the MoCA; 2/5 on delayed recall. Neurological exam during this visit showed the following:  Attention: Attention span &concentration ability appearsrestricted. Speech is fluent, without dysarthria, dysphonia or aphasia.  Mood and affectare normal, but wife reports episode of inappropriateanger, blunt comments. Cranial nerves:no loss of smell or taste reportedPupils are equal and briskly reactive to light.  Funduscopic examdeferred.Extraocular movements in vertical and horizontal planes were intact and without nystagmus. No Diplopia. Visual fields by finger perimetry are  intact. Hearing was intact to soft voice and finger rubbing. Facial sensation intact to fine touch. Facial motor strength is symmetric and tongue and uvula move midline. Neck ROM: rotation, tilt and flexion extension were normal for age andligament damage-shoulder shrug was symmetrical- but he reports shoulder pain, arthritis, having lost arm swing. Motor exam:Symmetric bulk, tone and ROM. Normal tone without cog-wheeling, symmetric grip strength . Sensory: Fine touch and vibration werelost in both feet /ankles.Proprioception tested in the upper extremities was normal. Coordination: Rapid alternating movements in the fingers/handswere slowed.The Finger-to-nose maneuver was intact without evidence of ataxia, dysmetria or tremor.  Gait and station:Patient could rise unassisted from a seated position, walked without assistive device. Stance is of normal width/base and the patient turned with4steps. He walked with reduced arm swingon the left versus right. Toe and heel walk were deferred.  Deep tendon reflexes:in the upper and lower extremities are symmetric andtrace.  Clinical Interview: Reports primary difficulties with computation and math, "filtering what/how things are said", remembering dates, times, and recent conversations, "keeping track of a list", and brief periods of "feeling lost". Short-term memory loss causes him to forget conversations and details from events of late, he states.  He zones out and loses time.  He needs multiple compensatory strategies to remember things (e.g., calender to track appointments, turns pill bottles upside down after taking them). Depends on GPS for driving as he has trouble remembering even familiar routes (e.g., has to think more about destination).  He describes remote memory as "good". Decreased attention and concentration were endorsed and he tends to lose his train of thought.  His speed of thinking is slow and he describes feeling foggy  headed".  Analytical thinking, multitasking abilities and problem-solving skills are diminished, and organization and planning are problematic. Patient reports trouble "picturing concepts" in mind that interferes with his ability to program computers and perform even well learned operations. He also mentions trouble with receptive language.    Exercises with a personal trainer 2 x week that reportedly helps with muscle aches and leg/hip pain.   Stopped riding motorcycle    Onset/Course: Memory within the past year. Mood and behavioral change around 2 years ago. Falls and balance issues within the last 1-2 years.    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   Complex ADLs Driving: Able to perform with GPS. Trouble navigating within the last year.  Medication management: Able to perform independently with compensatory strategy  Management of finances: Depends on wife.  Appointments: Able to manage with calender  Cooking: Able to cook basic meals but wife does most of the cooking.   Medical/Physical complaints:  Any hx of stroke/TIA, MI, LOC/TBI, Sz? Denies. Does report "ice pick headaches" in the base and back of the skull that last for around 30-60 seconds.  Hx falls? [place] [address] on head/ forehead and "bounced on the concrete"; denies LOC or any concussion symptoms. Fell backwards 6 weeks later going down stairs.  Balance, probs walking? Endorsed. Feels more unstable on feet. New Balance shoes help some. Walking slower in the past 2-3 years. Pronates inward.  Sleep: Insomnia? OSA? CPAP? REM sleep beh sx? Wake up 2-3 times to urinate during the night. Taking several naps during the day but denies excessive daytime sleepiness. Denies feeling refreshed or restored in mornings. Visual illusions/hallucinations? Denies   Appetite/Nutrition/Weight  changes: Denies  Adds crystal light to water and Splenda to unsweat tea. Drinks a variety of juices (e.g.,  apple, mango, cranberry, etc.). Eats banana and peanut bbutter sandwich for lunch. Eats red meat most evenings and fish on occasion.   Current mood: Depressed  Behavioral disturbance/Personality change: More easily frustrated in the last year. Described as having poor judgement and making blunt comments without filter. He is increasingly irritable and agitated as well as short tempered; this leads to verbal outbursts.  He has less patience when interacting with granddaughter and frequently interrupts her after a few minutes of talking. He gets angery over trivial matters and comes across as more stern than he typically would be. Wife describes this is a significant change as he "used to be the more relaxed one" in their marriage.    Wife first noticed patient making inappropriate comments around 2 years ago. She is not aware of any vulgar (e.g., foul language) or sexually inappropriate comments or behavior. Describes patient as more sarcastic and being a "smart alec".   Suicidal Ideation/Intention: Denies   Psychiatric History: History of depression, anxiety, other MH disorder: History of MH treatment: Started on Wellbutrin around 2010 and then added Cymbalta circa 2014.  Attended around 4-5 therapy sessions within the past year. History of SI: Denies  History of substance dependence/treatment: Denies  Social History: Born/Raised:  [employer], [place] Education: Repeated 2nd grade due to visual problems (i.e., myopia) . 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 Allergic rhinitis, seasonal   . Asthma   . Bronchitis   . Chronic kidney disease, stage I   . collar bone    dislocation left  side 05/2014  . Degenerative joint disease of knee    bilateral  . Diabetes mellitus   . GERD (gastroesophageal reflux disease)   . Heart murmur    history of  . History of acute prostatitis   . History of hematuria   . Hyperlipidemia   . Hypertension   . IgA nephropathy   . OSA on CPAP   . Pneumonia   . Sinusitis    Imaging: No acute findings on MRI (03/16/20).  Age-related volume loss was commented on mild chronic small vessel ischemic changes, tortuous and ectatic vessels were noted but no aneurysm or calcification or stenosis. Left mastoid effusion was also mentioned.    MRI of the brain with and without contrast on 05/17/20 showed the following:  1.  Scattered T2/FLAIR hyperintense foci in the hemispheres and pons consistent with mild chronic microvascular ischemic change, unchanged compared to the 03/16/2020 MRI. 2.  Mild generalized cortical atrophy, unchanged compared to the previous MRI. 3. Dolichoectasia of the vertebral, basilar, internal carotid and middle cerebral arteries, unchanged in appearance compared to the previous MRI. 4. Minimal left mastoid effusion, improved compared to the previous MRI.  Current medications:  Outpatient Encounter Medications as of 09/16/2020  Medication Sig  . albuterol (PROVENTIL HFA;VENTOLIN HFA) 108 (90 BASE) MCG/ACT inhaler Inhale 2 puffs into the lungs every 6 (six) hours as needed for wheezing or shortness of breath. For shortness of breath  . Ascorbic Acid (VITAMIN C PO) Take 1,000 mg by mouth daily.  Marland Kitchen aspirin EC 81 MG tablet Take 81 mg  by mouth every evening.   . chlorpheniramine (CHLOR-TRIMETON) 4 MG tablet Take 4 mg by mouth at bedtime as needed (Sinus drainage).  . Cyanocobalamin (VITAMIN B12) 500 MCG TABS Take 500 mcg by mouth daily.  Marland Kitchen diltiazem (CARDIZEM CD) 240 MG 24 hr capsule Take 240 mg by mouth daily.  Marland Kitchen donepezil (ARICEPT) 10 MG tablet Take 1 tablet (10 mg total) by mouth at bedtime.  . DULoxetine HCl 60 MG CSDR Take 60  mg by mouth daily.  Marland Kitchen ibuprofen (ADVIL,MOTRIN) 200 MG tablet Take 200 mg by mouth daily as needed for headache.  . Insulin Degludec (TRESIBA [place]) Inject 80 Units into the skin daily.  . irbesartan-hydrochlorothiazide (AVALIDE) 300-12.5 MG tablet Take 1 tablet by mouth every evening.   . metFORMIN (GLUCOPHAGE) 1000 MG tablet Take 1 tablet (1,000 mg total) by mouth 2 (two) times daily with a meal. Restart on 8/22 (Patient taking differently: Take 1,000 mg by mouth 2 (two) times daily with a meal.)  . metoprolol succinate (TOPROL-XL) 50 MG 24 hr tablet Take 50 mg by mouth 2 (two) times daily. Take with or immediately following a meal.  . NOVOLOG FLEXPEN 100 UNIT/ML FlexPen Inject 40-60 Units into the skin 3 (three) times daily before meals. Sliding scale  . Omega-3 Fatty Acids (FISH OIL) 1000 MG CPDR Take 2,000 Units by mouth daily.  . pantoprazole (PROTONIX) 40 MG tablet Take 40 mg by mouth daily.  . pravastatin (PRAVACHOL) 40 MG tablet Take 40 mg by mouth every evening.    No facility-administered encounter medications on file as of 09/16/2020.   Family History: His father died at age 60 of coronary artery disease and also had type II diabetes  and degenerative joint joint disease. His mother died at 68. She had a history of nephrolithiasis and coronary artery disease. His sister (6 years younger) has thyroid problems and underwent bariatric surgery for morbid obesity.  Current Examination:  Behavioral Observations:  Donald [name] completed 240 minutes of neuropsychological testing with technician, Dina Rich, BS, under the supervision of this provider. He completed another 120 minutes of testing during a separate appointment as he took an extended amount of time completing the foundational battery (I.e., WAIS-IV & WMS-IV). He completed additional measures of of language, motor and executive functioning during this appointment. The patient did not appear overtly distressed by the testing  session, per behavioral observation or via self-report to the technician or this provider. Rest breaks were offered during both testing sessions.   Behavioral Observations:   Appearance: Casually dressed and slightly unkempt. Appeared his chronological age  Gait: Ambulated independently, no gross abnormalities observed.  Speech: Mostly fluent but slightly dysarthric at times; normal rate, rhythm and volume. Minimal word finding difficulty.  Thought process: Linear, goal directed, and logical Affect: Flattened Interpersonal: Polite and generally appropriate, but easily agitated. Orientation: Oriented to all spheres. Accurately named the current President and his predecessor.   Tests Administered:  [place] (BNT)  Controlled Oral Word Association Test (COWAT; FAS & Animals)   D[place]ter Executive Functioning System (D-KEFS), Select subtests  Finger Tapping Test (FTT)  Grooved Pegboard  Hand Dynamometer   Trail Making Test (TMT; Part A & B)  Wechsler Adult Intelligence Scale, 4th Edition (WAIS-IV)  Wechsler Memory Scale, 4th Edition (WMS-IV); Older Adult Battery   [employer] Test [facility])  Test Results: Note: Standardized scores are presented only for use by appropriately trained professionals and to allow for any future test-retest comparison. These scores  should not be interpreted without consideration of all the information that is contained in the rest of the report. The most recent standardization samples from the test publisher or other sources were used whenever possible to derive standard scores; scores were corrected for age, gender, ethnicity and education when available.   Test Scores:  Note: This summary of test scores accompanies the interpretive report and should not be considered in isolation without reference to the appropriate sections in the text. Descriptors are based on appropriate normative data and may be adjusted based on clinical  judgment. The terms "impaired" and "within normal limits (WNL)" are used when a more specific level of functioning cannot be determined.    Validity Testing:     Descriptor         WAIS-IV Reliable Digit Span: --- --- Within Expectation       Intellectual Functioning:               Wechsler Adult Intelligence Scale (WAIS-IV):  Standard Score/ Scaled Score Percentile    Full Scale IQ  108 70 Average  GAI 121 92 Well Above Average  Verbal Comprehension Index: 127 85 Well Above Average  Similarities  14 91 Above Average  Vocabulary 15 95 Well Above Average  Information  15 95 Well Above Average  Perceptual Reasoning Index:  109 73 Average  Block Design  12 75 Above Average  Matrix Reasoning  14 91 Above Average  Visual Puzzles 9 37 Average  Working Memory Index: 97 42 Average  Digit Span 10 50 Average  Arithmetic  9 37 Average  Processing Speed Index: 84 14 Below Average  Symbol Search  7 16 Below Average  Coding 7 16 Below Average         Wechsler Adult Intelligence Scale (WAIS-IV) Short Form*: Standard Score/ Scaled Score Percentile    Full Scale IQ 113 81 Above Average  Similarities 14 91 Above Average  Digit Span 10 50 Average  Vocabulary 15 95 Well Above Average  Arithmetic 9 37 Average  Information 15 95 Well Above Average  *From [employer] al. (2020)             Memory:               Wechsler Memory Scale (WMS-IV):  Adult Battery                    Raw Score (Scaled Score) Percentile    Logical Memory I 21/50 (8) 25 Average  Logical Memory II 14/50 (7) 16 Below Average  Logical Memory Recognition 23/30 26-50 Average         Verbal Paired Associates I 18 (7) 16 Below Average  Verbal Paired Associates II 7 (9) 36 Average  Verbal Paired Associates Recognition  36/40 26-50 Average       Visual Reproduction I 33/43 (10) 50 Average  Visual Reproduction II 12/43 (7) 16 Below Average  Visual Reproduction Recognition 5/7 26-50 Average         Attention/Executive  Function:               Trail Making Test (TMT): Raw Score  (T Score) Percentile    Part A 50 secs., 0 errors (38) 12 Below Average  Part B 130 secs., 0 errors (37) 9 Below Average           Scaled Score Percentile    WAIS-IV Coding: 7 16 Below Average  Scaled Score Percentile    WAIS-IV Digit Span: 10 50 Average  Forward 11 63 Average  Backward 10 50 Average  Sequencing 8 25 Average           Age-Scaled Score Percentile    Wechsler Memory Scale (WMS-IV) Symbol Span: 10 50 Average           Scaled Score Percentile    WAIS-IV Similarities: 14 91 Above Average         D-KEFS Color-Word Interference Test: Raw Score (Scaled Score) Percentile   Color Naming  40 secs. (7) 16 Below Average  Word Reading  29 secs. (8) 25 Average  Inhibition  86 secs. (7) 16 Below Average  Inhibition/Switching  73 secs. (11) 36 Average       [place] Card Sorting Test: Raw Score Percentile    Categories (trials) 6 (73) >16 Average  Total Errors 10 95 Well Above Average  % Perseverative Errors 7 92 Well Above Average  % Non-Perseverative Errors 7 84 Above Average  Failure to Maintain Set 0 >16 Average         Language:               Verbal Fluency Test: Raw Score  (T Score) Percentile    Phonemic Fluency (FAS) 36 (46) 34 Average  Animal Fluency 18 (48) 42 Average           Raw Score (T Score) Percentile    [place] Naming Test (BNT): 56/60 (51) 54 Average         Visuospatial/Visuoconstruction:          Raw Score Percentile    Clock Drawing: 10/10 --- WNL           Scaled Score Percentile    WAIS-IV Block Design: 12 75 Above Average  WAIS-IV Matrix Reasoning: 14 91 Above Average  WAIS-IV Visual Puzzles: 9 37 Average         Sensory-Motor:               [place] Grooved Pegboard Test: Raw Score  (T-Score) Percentile    Dominant Hand 99 secs., 1 drop (41) 18 Below Average  Non-Dominant Hand 91 secs., 0 drops  (44) 27 Average         Finger Tapping Test: Mean (T-Score)  Percentile    Dominant Hand 50 (47) 38 Average  Non-Dominant Hand 51 (59) 82 Above Average         Hand Dynamometer     Dominant Hand 25 Kg --- Below Average  Non-Dominant Hand 25 Kg --- Below Average        Description of Test Results:  Premorbid verbal intellectual abilities were estimated to have been above average based on a test of vocabulary ability. Current intellectual functioning was average and slightly below this estimate. His general ability index score was well above average and much closer to this original prediction. Psychomotor processing speed was below average and his most impacted domain. Auditory attention and working memory were average. Language abilities were intact overall. His verbal comprehension index score was well above average and stronger than perceptual reasoning index, which fell in the average range. Within this latter domain, he performed above average on tests of visual-spatial construction and abstract reasoning but average range on a measure of visual analysis and integration; this required him to mentally rotate and reconstruct components of a visual puzzle. Other language abilities appeared intact. Specifically, confrontation naming was average, and semantic verbal fluency was average.  With regard to verbal memory, encoding and acquisition of non-contextual information (i.e., paired-word list) was below average. After a 20-minute delay, free recall was average. Performance on a yes/no recognition task was average. On another verbal memory test, encoding and acquisition of contextual auditory information (i.e., short stories) was average. After a delay, free recall was below average. Performance on a yes/no recognition task was average. With regard to non-verbal memory, immediate recall of simple line drawings was average. After a 20 minute delay, free recall fell to below average. Recognition for these designs was average. Executive functioning was slightly mixed  but intact overall. Mental flexibility and set-shifting were below average on Trails B. Verbal fluency with phonemic search restrictions was average. Verbal abstract reasoning was above average. Non-verbal abstract reasoning was above average. Response inhibition was below average. When a switching component was added, performance improved to average. Color naming speed was below average. Word naming speed was average. The patient's ability to identify, maintain, and shift problem solving strategies in response to examiner feedback was excellent and above average. 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 However, there are a few areas of relative weakness suggestive of mild fronto-subcortical dysfunction (e.g., variable encoding abilities with intact consolidation; decreased information processing speed). 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 Based on his medical history including multiple chronic diseases (e.g, CKD stage III) and vascular risk factors (e.g., hypertension, diabetes type II, OSA, hypoxemia), and recent neuroimaging demonstrating chronic small vessel ischemic changes, a mixed etiology is suspected. It is possible that chronic kidney disease and contributions from OSA and hypoxemia are playing a role. According to records, his most recent sleep study (circa 2014) showed that he spent sustained time in low oxygen and recorded oxygen nadir 77% and overall AHI of 5.7/h, with a mild degree of apnea. Total hypoxemia time at 89 and below was 2 hours and 45 minutes. Total sleep latency was found to be 65 minutes in sleep lab  during CPAP titration in 06/2013. Oxygen for hypoxemia with exercise was ordered by Dr. Brett Fairy but patient reportedly no longer has this available and uses a 54+ year old CPAP at 9 cm water pressure with nasal pillows. He may benefit from adhering from Dr. Edwena Felty original recommendation and use oxygen concentrator. An updated sleep study should help clarify the current clinical picture and make targeted recommendations to improve quality of sleep and oxygen levels.   The patient has fallen twice in the last in the last year and reports feeling generally unsteady on his feet. He recently purchased "Newbalance"shoes that purportedly help. He did not present with any gross motor abnormalities including tremors but displayed mildly reduced arm swing. He ambulated independently. No hallucinations or delusions were reported. This pattern is not generally consistent with LBD, PD, or other cortically or subcortically mediated dementia processes but cannot be ruled out at this time. Mild chronic microvascular ischemic white matter changes (unchanged compared to 03/16/20 MRI) in the deep white matter and pons may be related to issues with balance and slowed processing speed. Functional neuroimaging (i.e., FDG PET/ SPECT) would help to clarify the current clinical picture. The patient and family members are encouraged to closely monitor the patient's  cognitive and independent functioning over time.   While he reported being much more irritable and less patient than in past, his mood appears generally stable and he is not indicating clinically significant depression or anxiety at this time. However, he may benefit from cognitive behavioral therapy to work on identifying triggers for irritable mood, anger, and depression as well as work towards developing healthy coping mechanisms and implementing lifestyle modifications that can help to support ongoing mental health. Therapy should focus on building coping strategies  and skills to improve distress tolerance and better regulate emotions. He may also benefit from learning compensatory strategies to aid in encoding and recall of information.   Recommendations: Based on the findings of the present evaluation, the following recommendations are offered:  1. Functional neuroimaging (i.e., FDG PET/ SPECT) may help to clarify the current clinical picture.  2. Optimal control of vascular risk factors is necessary to reduce the risk of further cognitive decline. 3. The patient is encouraged to attend to lifestyle factors for brain health (e.g., regular physical exercise, good nutrition habits, regular participation in cognitively-stimulating activities, and general stress management techniques), which are likely to have benefits for both emotional adjustment and cognition.  In fact, in addition to promoting general good health, regular exercise incorporating aerobic activities (e.g., walking, bicycling, etc.) has been demonstrated to be a very effective treatment for depression and stress, with similar efficacy rates to both antidepressant medication and psychotherapy. And for those with orthopedic issues, water aerobics may be particularly beneficial.  4. He may benefit from physical therapy and balance training to prevent future falls. 5. Consider updated sleep study to monitor latency and hypoxemia. He should comply with medical recommendations regarding supplemental oxygen benefit from CPAP with oxygen to target hypoxemia. 6. Based on his test performances, the patient will likely benefit from repetition of to-be-remembered information. 7. This individual appears to be experiencing reduced distress tolerance and more  depressive symptoms in recent years. As such, engaging in psychotherapy should be considered to better manage mood symptoms. He may benefit from cognitive behavioral therapy to work on identifying triggers for irritable mood, anger, and depression as well as  work towards developing healthy coping mechanisms and implementing lifestyle modifications that can help to support ongoing mental health. Therapy should focus on building coping strategies and skills to improve distress tolerance and better regulate emotions. He may also benefit from learning compensatory strategies to aid in encoding and recall of information.  8. Neuropsychological re-assessment in one year (or sooner if there is a change in cognition or behavior) is recommended in order to monitor cognitive status, track any progression of symptoms and further assist with treatment planning.  Feedback to Patient: Donald [name] returned for a feedback appointment on 09/23/2020 to review the results of his neuropsychological evaluation with this provider.    Thank you for your referral of Donald [name]. Please feel free to contact me if you have any questions or concerns regarding this report.

## 2020-09-23 ENCOUNTER — Other Ambulatory Visit: Payer: Self-pay

## 2020-09-23 ENCOUNTER — Ambulatory Visit: Payer: HMO | Admitting: Psychology

## 2020-09-23 ENCOUNTER — Encounter: Payer: HMO | Admitting: Psychology

## 2020-09-23 DIAGNOSIS — N183 Chronic kidney disease, stage 3 unspecified: Secondary | ICD-10-CM

## 2020-09-23 DIAGNOSIS — Z9989 Dependence on other enabling machines and devices: Secondary | ICD-10-CM

## 2020-09-23 DIAGNOSIS — G3184 Mild cognitive impairment, so stated: Secondary | ICD-10-CM

## 2020-09-23 DIAGNOSIS — G4733 Obstructive sleep apnea (adult) (pediatric): Secondary | ICD-10-CM | POA: Diagnosis not present

## 2020-09-23 DIAGNOSIS — N390 Urinary tract infection, site not specified: Secondary | ICD-10-CM | POA: Diagnosis not present

## 2020-09-23 DIAGNOSIS — R0902 Hypoxemia: Secondary | ICD-10-CM

## 2020-09-23 DIAGNOSIS — F0670 Mild neurocognitive disorder due to known physiological condition without behavioral disturbance: Secondary | ICD-10-CM

## 2020-09-23 MED ORDER — MEMANTINE HCL 28 X 5 MG & 21 X 10 MG PO TABS: ORAL_TABLET | ORAL | 0 refills | Status: DC

## 2020-09-23 NOTE — Telephone Encounter (Signed)
Called wife back.  Since we last spoke they have attempted to do 5 mg in the morning 5 mg in the evening.  He tried that for about a week and did not really notice a difference in how he felt.  They decided to decrease down to 5 mg a day.  Monday morning he took 5 mg.  When he woke up on Tuesday morning he felt the best he had felt in a long time.  Tuesday night is when he took the next 5 mg dose and when he woke up on Wednesday morning he did not feel good.  He did not take a dose at all on Wednesday and when he woke up this morning his wife offered for him to take the medication and at that time he declined.  He stated he wanted to get some chores done before taking the medication because as soon as he takes the medication his ability to focus decreases and he develops a headache and the nausea.  Overall they do not feel that this medication should be continued.  With that said the wife asked what the next steps be.  I informed that I would discuss with Dr. Brett Fairy to see what her thoughts were.  Informed that an alternative medication that is sometimes tried is called Namenda.  Patient has not tried this before.  Advised that I would discuss with Dr. Brett Fairy to see if she feels the patient should stay off medication altogether or if she thinks trying the Namenda would be worth a try.  Advised after I discussed with Dr. Brett Fairy I will contact the patient's wife to provide update.

## 2020-09-23 NOTE — Addendum Note (Signed)
Addended by: Darleen Crocker on: 09/23/2020 03:04 PM   Modules accepted: Orders

## 2020-09-23 NOTE — Telephone Encounter (Signed)
Noted I will take the aricept off of medication list and place the other medication on hold.

## 2020-09-23 NOTE — Telephone Encounter (Signed)
Pt's wife, Shauna Hugh called, want to discuss making an adjustment to his donepezil (ARICEPT) 10 MG tablet.

## 2020-09-23 NOTE — Addendum Note (Signed)
Addended by: Darleen Crocker on: 09/23/2020 01:11 PM   Modules accepted: Orders

## 2020-09-23 NOTE — Addendum Note (Signed)
Addended by: Darleen Crocker on: 09/23/2020 12:13 PM   Modules accepted: Orders

## 2020-09-23 NOTE — Telephone Encounter (Signed)
Pt is asking that Casey,RN be informed that they met with Dr Darol Destine and they would like to wait on changing pt's medications until pt has seen Dr Brett Fairy in June

## 2020-09-23 NOTE — Progress Notes (Signed)
Neuropsychology Feedback Appointment  Donald [name] and his wife returned for a feedback appointment today to review the results of his recent neuropsychological evaluation with this provider. 60 minutes face-to-face time was spent reviewing his test results, my impressions and my recommendations as detailed in his report. Education was provided about mild neurocognitive disorder. The patient and wife were given the opportunity to ask questions, and I did my best to answer these to their satisfaction. Patient was scheduled for a 49-month follow up appointment to monitor his symptoms and functioning over time and determine if re-testing is medically necessary.    Below is a copy of the description of test results, clinical impressions, and recommendations from the evaluation in EMR on 09/22/20. 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 Current intellectual functioning was average and slightly below this estimate. His general ability index score was well above average and much closer to this original prediction. Psychomotor processing speed was below average and his most impacted domain. Auditory attention and working memory were average. Language abilities were intact overall. His verbal comprehension index score was well above average and stronger than perceptual reasoning index, which fell in the average range. Within this latter domain, he performed above average on tests of visual-spatial construction and abstract reasoning but average range on a measure of visual analysis and integration; this required him to mentally rotate and reconstruct components of a visual puzzle. Other language abilities appeared intact. Specifically, confrontation naming was average, and semantic verbal fluency was average.   With regard to  verbal memory, encoding and acquisition of non-contextual information (i.e., paired-word list) was below average. After a 20-minute delay, free recall was average. Performance on a yes/no recognition task was average. On another verbal memory test, encoding and acquisition of contextual auditory information (i.e., short stories) was average. After a delay, free recall was below average. Performance on a yes/no recognition task was average. With regard to non-verbal memory, immediate recall of simple line drawings was average. After a 20 minute delay, free recall fell to below average. Recognition for these designs was average. Executive functioning was slightly mixed but intact overall. Mental flexibility and set-shifting were below average on Trails [place]. Verbal fluency with phonemic search restrictions was average. Verbal abstract reasoning was above average. Non-verbal abstract reasoning was above average. Response inhibition was below average. When a switching component was added, performance improved to average. Color naming speed was below average. Word naming speed was average. The patient's ability to identify, maintain, and shift problem solving strategies in response to examiner feedback was excellent and above average. 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 However, there are a few areas of relative weakness suggestive of mild fronto-subcortical dysfunction (e.g., variable encoding abilities with intact consolidation; decreased information processing speed). 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 Based on his medical history  including multiple chronic diseases (e.g, CKD stage III) and vascular risk factors (e.g., hypertension, diabetes type II, OSA, hypoxemia), and recent neuroimaging demonstrating chronic small vessel ischemic changes, a mixed etiology  is suspected. It is possible that chronic kidney disease and contributions from OSA and hypoxemia are playing a role. According to records, his most recent sleep study (circa 2014) showed that he spent sustained time in low oxygen and recorded oxygen nadir 77% and overall AHI of 5.7/h, with a mild degree of apnea. Total hypoxemia time at 89 and below was 2 hours and 45 minutes. Total sleep latency was found to be 65 minutes in sleep lab during CPAP titration in 06/2013. Oxygen for hypoxemia with exercise was ordered by Dr. Brett Fairy but patient reportedly no longer has this available and uses a 31+ year old CPAP at 9 cm water pressure with nasal pillows. He may benefit from adhering from Dr. Edwena Felty original recommendation and use oxygen concentrator. An updated sleep study should help clarify the current clinical picture and make targeted recommendations to improve quality of sleep and oxygen levels.   The patient has fallen twice in the last in the last year and reports feeling generally unsteady on his feet. He recently purchased "Newbalance"shoes that purportedly help. He did not present with any gross motor abnormalities including tremors but displayed mildly reduced arm swing. He ambulated independently. No hallucinations or delusions were reported. This pattern is not generally consistent with LBD, PD, or other cortically or subcortically mediated dementia processes but cannot be ruled out at this time. Mild chronic microvascular ischemic white matter changes (unchanged compared to 03/16/20 MRI) in the deep white matter and pons may be related to issues with balance and slowed processing speed. Functional neuroimaging (i.e., FDG PET/ SPECT) would help to clarify the current  clinical picture. The patient and family members are encouraged to closely monitor the patient's cognitive and independent functioning over time.   While he reported being much more irritable and less patient than in past, his mood appears generally stable and he is not indicating clinically significant depression or anxiety at this time. However, he may benefit from cognitive behavioral therapy to work on identifying triggers for irritable mood, anger, and depression as well as work towards developing healthy coping mechanisms and implementing lifestyle modifications that can help to support ongoing mental health. Therapy should focus on building coping strategies and skills to improve distress tolerance and better regulate emotions. He may also benefit from learning compensatory strategies to aid in encoding and recall of information.   Recommendations: Based on the findings of the present evaluation, the following recommendations are offered:  1. Functional neuroimaging (i.e., FDG PET/ SPECT) may help to clarify the current clinical picture.  2. Optimal control of vascular risk factors is necessary to reduce the risk of further cognitive decline. 3. The patient is encouraged to attend to lifestyle factors for brain health (e.g., regular physical exercise, good nutrition habits, regular participation in cognitively-stimulating activities, and general stress management techniques), which are likely to have benefits for both emotional adjustment and cognition.  In fact, in addition to promoting general good health, regular exercise incorporating aerobic activities (e.g., walking, bicycling, etc.) has been demonstrated to be a very effective treatment for depression and stress, with similar efficacy rates to both antidepressant medication and psychotherapy. And for those with orthopedic issues, water aerobics may be particularly beneficial.  4. He may benefit from physical therapy and balance training to  prevent future falls. 5. Consider updated sleep study to monitor latency and hypoxemia. He should comply with medical recommendations regarding supplemental oxygen benefit from CPAP with oxygen to target hypoxemia. 6. Based on his test performances, the patient  will likely benefit from repetition of to-be-remembered information. 7. This individual appears to be experiencing reduced distress tolerance and more  depressive symptoms in recent years. As such, engaging in psychotherapy should be considered to better manage mood symptoms. He may benefit from cognitive behavioral therapy to work on identifying triggers for irritable mood, anger, and depression as well as work towards developing healthy coping mechanisms and implementing lifestyle modifications that can help to support ongoing mental health. Therapy should focus on building coping strategies and skills to improve distress tolerance and better regulate emotions. He may also benefit from learning compensatory strategies to aid in encoding and recall of information.  8. Neuropsychological re-assessment in one year (or sooner if there is a change in cognition or behavior) is recommended in order to monitor cognitive status, track any progression of symptoms and further assist with treatment planning.

## 2020-09-23 NOTE — Telephone Encounter (Signed)
Spoke with Dr Brett Fairy and she agrees that given the side effects the patient has consistently had while taking the medication it would be worth a try to see if he better tolerates this medication.

## 2020-09-27 ENCOUNTER — Encounter: Payer: Self-pay | Admitting: Psychology

## 2020-09-28 ENCOUNTER — Ambulatory Visit: Payer: HMO | Admitting: Psychology

## 2020-10-01 DIAGNOSIS — N3000 Acute cystitis without hematuria: Secondary | ICD-10-CM | POA: Diagnosis not present

## 2020-10-01 DIAGNOSIS — R310 Gross hematuria: Secondary | ICD-10-CM | POA: Diagnosis not present

## 2020-10-04 DIAGNOSIS — K121 Other forms of stomatitis: Secondary | ICD-10-CM | POA: Diagnosis not present

## 2020-10-04 DIAGNOSIS — Z7984 Long term (current) use of oral hypoglycemic drugs: Secondary | ICD-10-CM | POA: Diagnosis not present

## 2020-10-04 DIAGNOSIS — Z794 Long term (current) use of insulin: Secondary | ICD-10-CM | POA: Diagnosis not present

## 2020-10-04 DIAGNOSIS — Z7952 Long term (current) use of systemic steroids: Secondary | ICD-10-CM | POA: Diagnosis not present

## 2020-10-04 DIAGNOSIS — Z87448 Personal history of other diseases of urinary system: Secondary | ICD-10-CM | POA: Diagnosis not present

## 2020-10-04 DIAGNOSIS — T370X5A Adverse effect of sulfonamides, initial encounter: Secondary | ICD-10-CM | POA: Diagnosis not present

## 2020-10-04 DIAGNOSIS — E1165 Type 2 diabetes mellitus with hyperglycemia: Secondary | ICD-10-CM | POA: Diagnosis not present

## 2020-10-04 DIAGNOSIS — B370 Candidal stomatitis: Secondary | ICD-10-CM | POA: Diagnosis not present

## 2020-10-07 DIAGNOSIS — Z125 Encounter for screening for malignant neoplasm of prostate: Secondary | ICD-10-CM | POA: Diagnosis not present

## 2020-10-07 DIAGNOSIS — Z0000 Encounter for general adult medical examination without abnormal findings: Secondary | ICD-10-CM | POA: Diagnosis not present

## 2020-10-07 DIAGNOSIS — E785 Hyperlipidemia, unspecified: Secondary | ICD-10-CM | POA: Diagnosis not present

## 2020-10-07 DIAGNOSIS — E118 Type 2 diabetes mellitus with unspecified complications: Secondary | ICD-10-CM | POA: Diagnosis not present

## 2020-10-14 DIAGNOSIS — R82998 Other abnormal findings in urine: Secondary | ICD-10-CM | POA: Diagnosis not present

## 2020-10-14 DIAGNOSIS — R748 Abnormal levels of other serum enzymes: Secondary | ICD-10-CM | POA: Diagnosis not present

## 2020-10-14 DIAGNOSIS — E118 Type 2 diabetes mellitus with unspecified complications: Secondary | ICD-10-CM | POA: Diagnosis not present

## 2020-10-14 DIAGNOSIS — Z794 Long term (current) use of insulin: Secondary | ICD-10-CM | POA: Diagnosis not present

## 2020-10-14 DIAGNOSIS — R413 Other amnesia: Secondary | ICD-10-CM | POA: Diagnosis not present

## 2020-10-14 DIAGNOSIS — N182 Chronic kidney disease, stage 2 (mild): Secondary | ICD-10-CM | POA: Diagnosis not present

## 2020-10-14 DIAGNOSIS — M170 Bilateral primary osteoarthritis of knee: Secondary | ICD-10-CM | POA: Diagnosis not present

## 2020-10-14 DIAGNOSIS — E785 Hyperlipidemia, unspecified: Secondary | ICD-10-CM | POA: Diagnosis not present

## 2020-10-14 DIAGNOSIS — N059 Unspecified nephritic syndrome with unspecified morphologic changes: Secondary | ICD-10-CM | POA: Diagnosis not present

## 2020-10-14 DIAGNOSIS — F39 Unspecified mood [affective] disorder: Secondary | ICD-10-CM | POA: Diagnosis not present

## 2020-10-14 DIAGNOSIS — Z1212 Encounter for screening for malignant neoplasm of rectum: Secondary | ICD-10-CM | POA: Diagnosis not present

## 2020-10-14 DIAGNOSIS — Z86010 Personal history of colonic polyps: Secondary | ICD-10-CM | POA: Diagnosis not present

## 2020-10-14 DIAGNOSIS — I129 Hypertensive chronic kidney disease with stage 1 through stage 4 chronic kidney disease, or unspecified chronic kidney disease: Secondary | ICD-10-CM | POA: Diagnosis not present

## 2020-10-14 DIAGNOSIS — Z0000 Encounter for general adult medical examination without abnormal findings: Secondary | ICD-10-CM | POA: Diagnosis not present

## 2020-10-18 ENCOUNTER — Other Ambulatory Visit: Payer: Self-pay

## 2020-10-18 ENCOUNTER — Encounter: Payer: Self-pay | Admitting: Neurology

## 2020-10-18 ENCOUNTER — Ambulatory Visit: Payer: HMO | Admitting: Neurology

## 2020-10-18 ENCOUNTER — Telehealth: Payer: Self-pay | Admitting: Neurology

## 2020-10-18 VITALS — BP 124/78 | HR 77 | Ht 69.0 in | Wt 203.0 lb

## 2020-10-18 DIAGNOSIS — G4734 Idiopathic sleep related nonobstructive alveolar hypoventilation: Secondary | ICD-10-CM

## 2020-10-18 DIAGNOSIS — G4733 Obstructive sleep apnea (adult) (pediatric): Secondary | ICD-10-CM

## 2020-10-18 DIAGNOSIS — E119 Type 2 diabetes mellitus without complications: Secondary | ICD-10-CM

## 2020-10-18 DIAGNOSIS — Z9989 Dependence on other enabling machines and devices: Secondary | ICD-10-CM

## 2020-10-18 DIAGNOSIS — E1169 Type 2 diabetes mellitus with other specified complication: Secondary | ICD-10-CM | POA: Diagnosis not present

## 2020-10-18 DIAGNOSIS — G3184 Mild cognitive impairment, so stated: Secondary | ICD-10-CM

## 2020-10-18 DIAGNOSIS — G309 Alzheimer's disease, unspecified: Secondary | ICD-10-CM

## 2020-10-18 DIAGNOSIS — Z8679 Personal history of other diseases of the circulatory system: Secondary | ICD-10-CM

## 2020-10-18 MED ORDER — MEMANTINE HCL 5 MG PO TABS: 5.0000 mg | ORAL_TABLET | Freq: Two times a day (BID) | ORAL | 0 refills | Status: DC

## 2020-10-18 NOTE — Patient Instructions (Signed)
SPECT Brain Scan A SPECT scan, or single-photon emission computed tomography, is a test that is used to study the brain. For the test, a small amount of radioactive material is injected into a vein. This material travels through your bloodstream to the brain, and a special camera (nuclear medicine camera) then takes pictures of your brain. These pictures help your health careprovider see how blood flows to parts of the brain. The test may be done to: Evaluate memory loss. Evaluate a brain injury. Diagnose certain conditions, including: Dementia, such as Alzheimer's disease. Strokes. Epilepsy. Tell a health care provider about: Any allergies you have. All medicines you are taking, including vitamins, herbs, eye drops, creams, and over-the-counter medicines. Any blood disorders you have. Any surgeries you have had. Any medical conditions you have. If you are afraid of cramped spaces (claustrophobic). If claustrophobia is a problem, you may be given a medicine to help you relax (sedative) or a medicine to treat anxiety. If you have trouble staying still for long periods of time. Whether you are pregnant, may be pregnant, or are breastfeeding. What are the risks? Generally, this is a safe procedure. However, problems may occur, including: Bleeding, pain, or swelling at the injection site. An allergic reaction to the radioactive material. This is rare. Exposure to a small amount of radiation. What happens before the procedure? Follow instructions from your health care provider about eating or drinking restrictions. Ask your health care provider about: Changing or stopping your regular medicines. This is especially important if you are taking diabetes medicines or blood thinners. Taking medicines such as aspirin and ibuprofen. These medicines can thin your blood. Do not take these medicines unless your health care provider tells you to take them. Taking over-the-counter medicines, vitamins,  herbs, and supplements. Do not wear jewelry. Wear loose, comfortable clothing. You may be asked to wear a hospital gown for the procedure. If you have diabetes, ask your health care provider for diet guidelines to control your blood sugar (glucose) levels on the day of the test. What happens during the procedure?  An IV will be inserted into one of your veins. A small amount of radioactive material will be injected through the IV. You will wait several minutes after the injection. This allows the radioactive material to travel to your brain. You will lie on your back on a cushioned table. The test will begin when the material has reached your brain. A nuclear medicine camera will take pictures of your brain. You will need to stay still during this time. The length of time needed for the scan will vary depending on the reason you need the scan. The camera will record the amount of radioactive material in the brain. Different amounts of radioactive material will show up as different colors on a computer, indicating how much of the material flowed to different parts of the brain. The procedure may vary among health care providers and hospitals. What can I expect after procedure?  Ask your health care provider, or the department that is doing the test: When will my results be ready? How will I get my results? What are my treatment options? What other tests do I need? What are my next steps? You may return to your normal diet and activities. Drink 6-8 glasses of water after the test to flush the radioactive material out of your body. Drink enough fluid to keep your urine pale yellow. Summary A SPECT scan, or single-photon emission computed tomography, is a test used to study  the brain. It can help evaluate a brain injury and aid in the diagnosis of Alzheimer's disease, dementia, stroke, and epilepsy. You may return to your normal diet and activities following a SPECT scan. This information is  not intended to replace advice given to you by your health care provider. Make sure you discuss any questions you have with your healthcare provider. Document Revised: 09/08/2019 Document Reviewed: 09/08/2019 Elsevier Patient Education  [place].

## 2020-10-18 NOTE — Telephone Encounter (Signed)
Katie from [facility] [employer] called needing clarification on the escribe they received on the pts memantine (NAMENDA) 5 MG tablet

## 2020-10-18 NOTE — Telephone Encounter (Signed)
Pet brain amyloid scan: sent to GI for scheduling, they obtain auth

## 2020-10-18 NOTE — Telephone Encounter (Signed)
Called [facility] back. Pt was never started on the Namenda starter pack. The script that was sent today only had 1 tablet as quantity. Advised the pharmacy for now to dc that script and just fill the starter pack and as long as the pt tolerates the dosage on the medication we will resend the corrected script once he is where should be on dosage.

## 2020-10-18 NOTE — Progress Notes (Signed)
SLEEP MEDICINE CLINIC    Provider:  Larey Seat, MD  Primary Care Physician:  Prince Solian, [place] [address]     Referring Provider: Prince Solian, [address] [place],  [place] [address]          Chief Complaint according to patient   Patient presents with:     New Patient (Initial Visit)     Follow -up evaluation of memory loss in established Sleep patient.  Confusional episodes, forgetting short term , forgetting numbers, executive failures.  Pt had stopped the aricept due to nausea and increased headaches. Since stopping the med, the nausea is better but he still waking up with headaches.DME [facility]) .  they have held -off starting the namenda.       HISTORY OF PRESENT ILLNESS:  Donald [name] is a 70 y.o. year old  Caucasian male patient seen here in a RV on 10/18/2020.  Donald [name] has had to stop the Aricept due to nausea and increased headaches.  After he stopped the Aricept nausea Better he still has headaches but normal 6 months.  Headaches have been responding to ibuprofen but he was warned not to take ibuprofen due to kidney disease he feels that alternating with Tylenol has not had the desired effect,.  For a urinary tract infection at the end of May he was treated with antibiotics that he did not tolerate.  He was given a prednisone Dosepak which made his otherwise more likely inflammatory symptoms disappear he felt much better, less in pain and also mentally clear and more relaxed.  Since this steroid Dosepak is now completed as he has seen a repeat return of some of his aches and pains.  Neuropsychology reports for this patients, encourage exercise, retesting need for oxygen ( he is on 02 )  in a sleep study and SPECT scan order. Impulse control is lower than expected, pretty much every cognitive domain was average or even above average except over 20 minute recall and color naming.  He is not seen by  pulmonology for oxygen need- small vessel disease of the brain may be related.  BRAIN MRI was unchanged -05-15-2020 Dr. Felecia Shelling, small vessel disease.   Donald [name] has been a compliant CPAP user at 97%, these are data from November through December 2021 however I am not quite sure what happened to the more recent data. Residual AHI is 2.5 how can I link my epic -integrated dragon to my NUANCE PRO handheld microphone? Donald [name], M.D. 95th percentile air leak is 7.7 L, 95th percentile pressure is 9.5 cmH2O.  All remaining or residual apneas were obstructive.  And with a minimum pressure of 7 maximum pressure of 10 and 2 cm EPR -there needs to be no reset.   04-15-2020: Donald [name] is a 70 y.o. male , seen here as in a referral from Dr. Dagmar [name] for a new evaluation of apnea. Patient is a highly compliant CPAP user since receiving a diagnosis of mild OSA and severe Hypoxemia in 2015. Here today not only to follow up for sleep, but with a concern about memory decline.  Donald [name] is here today with his spouse both voiced a concern about his short-term memory function, and he often has to look at instructions calendars for days again and again because he tends to forget them within minutes he reports.  His primary care physician Dr. Dagmar [name] had already ordered MRI  of the brain with CT clinical data of memory loss over the last 2 years.  The patient is status post cervical fusion procedure beginning at C3 and he had no acute findings on his MRI dated 11/22-2021.   Age-related volume loss was commented on mild chronic small vessel ischemic changes, tortuous and ectatic vessels were noted but no aneurysm or calcification or stenosis.  And left mastoid effusion was coincidentally also commented upon this MRI of the brain was done without contrast probably due to the patient's history of a chronic renal impairment. It is his wife's impression that he mind is getting more and more foggy and he unsure about  driving, he has a continuous headache.     PAST VISIT :  Donald [name] is a meanwhile 70 year old right-handed Caucasian gentleman and avid motorcyclist.  He was first seen for a sleep evaluation in May 2013 by Dr. Danton Sewer.. This was followed by a sleep study dated 27 October 2011.  He was not happy with the results and care but was given an oxygen concentrator from [place] he remembers that the oxygen was delivered already in March 2013 after an overnight pulse oximetry.  A new sleep evaluation was performed through me at [facility] in December 2014 and he underwent another sleep study here.  On 25 April 2013 his sleep study showed an overall AHI of only 5.7/h, and very mild degree of apnea but his oxygen nadir was 77% and he spent sustained time in low oxygen.  The total hypoxemia time at 89 and below was 2 hours and 45 minutes, the patient was therefore placed on oxygen supplement which he already owns as I understand.  He returned for a CPAP titration because I can only order oxygen as an adjunct therapy to obstructive sleep apnea or central sleep apnea therapy.  Oxygen strictly for hypoxemia with exercise and not sleep-related would need to be provided by a pulmonologist.   CPAP titration followed on 04 July 2013 his AHI was reduced to 1.9 /h.  He reported significant difficulties to stay asleep.  His sleep latency was 65 minutes in the sleep lab.  I understand that the patient has continued to use his CPAP on a regular basis in spite of not having followed up with me.  The CPAP settings that he uses now at the same but he no longer has oxygen available.  He purchased I understand a pulse oximetry device and has used it for nights with CPAP in place and 1 night without CPAP use.  All nights had prolonged severely prolonged hypoxemia.  This only confirms what we saw in 2015 that his apnea is not the main driver of hypoxemia.  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  GERD, heart murmur, hematuria, hypercholesterolemia and hypertension, history of prostatitis, community-acquired pneumonia.  Coronary artery disease evaluation was negative with an EF of 55% in 2013.   Donald [name] struggles with sinus congestion often has headaches when sleeping on his left side feeling congested and developing a pressure behind the eyes, he has nocturia 2-3 times each night, he appears depressed may be frustrated, stating that he is not excessively daytime sleepy but he also does not feel refreshed or restored in the mornings.  Family medical and sleep history: His father died at age 53 of coronary artery  disease he also had diabetes type 2 and degenerative joint joint disease.  His mother died also early at 59 years of age had a history of nephrolithiasis and coronary artery disease.  His sister who is 6 years younger than the patient has thyroid problems she has undergone bariatric surgery for morbid obesity..   Social history: The patient works as a Government social research officer, he enjoys riding his motorcycle, he denies any use of tobacco or alcohol but lost his job in 2016 and has been unemployed since he has a son age 1, he is married since 1980, he drinks coffee in the morning and crystallite with caffeine after 3 PM in the afternoon he has no history of shiftwork.   Sleep habits are as follows: The patient no longer has oxygen available but uses his CPAP at 9 cm water pressure which is now over 81 years old and he still using nasal pillows.  He describes a dinnertime at around 7 PM bedtime is usually around 9:30 PM always with CPAP, the bedroom is described as cool, quiet and dark he shares a bedroom with his wife.  He falls asleep usually within 15 minutes he has begun trying to sleep  supine which for an apnea patient is not a preferred sleep position but he uses 3 pillows under wedge to keep himself propped up.  He states that when he sleeps on one side sinus congestion sets in and his nose.  Up.  Usually worse when he sleeps on his left side.  He wakes up 2-3 times each night for nocturia and finally rises at 8 AM but only with alarm.  He does not feel refreshed and restored spontaneously and without alarm would be sleeping until noon.  He does not nap in daytime he reports.  He has sometimes been able to fall asleep in a recliner when he is reading or relaxed.  He has isolated occurrences of sleep paralysis.  As I described he has had multiple pulse oximetry's and perform these on himself with and without CPAP in place and found that he is still significantly hypoxic at this time.  However he is without oxygen.      Donald [name]  has a past medical history of Allergic rhinitis, seasonal, Asthma, Bronchitis, Chronic kidney disease, stage I, collar bone, Degenerative joint disease of knee,  Diabetes mellitus on insulin- , GERD (gastroesophageal reflux disease), Heart murmur, History of acute prostatitis, History of hematuria, Hyperlipidemia, Hypertension, IgA nephropathy, CKD,  OSA on CPAP, Pneumonia, and Sinusitis.   He fell without bracing his fall- July 2021, fell on his head, forehead, he " bounced of the concrete " Frontal lobe injury ? No vomiting, no LOC reported.         Review of Systems:  Memory loss  Montreal Cognitive Assessment  04/15/2020  Visuospatial/ Executive (0/5) 4  Naming (0/3) 3  Attention: Read list of digits (0/2) 2  Attention: Read list of letters (0/1) 1  Attention: Serial 7 subtraction starting at 100 (0/3) 3  Language: Repeat phrase (0/2) 2  Language : Fluency (0/1) 1  Abstraction (0/2) 2  Delayed Recall (0/5) 2  Orientation (0/6) 6  Total 26   MMSE - Mini Mental State Exam 10/18/2020 04/15/2020  Orientation to time 4 5  Orientation  to Place 5 5  Registration 3 3  Attention/ Calculation 5 5  Recall 2 3  Language- name 2 objects 2 2  Language- repeat 1 1  Language- follow 3 step  command 3 3  Language- read & follow direction 1 -  Write a sentence 1 -  Copy design 1 -  Total score 28 -     Out of a complete 14 system review, the patient complains of only the following symptoms, and all other reviewed systems are negative.:  Fatigue, headaches, irritability, impulse control failure, now angry, with personality changes.    How likely are you to doze in the following situations: 0 = not likely, 1 = slight chance, 2 = moderate chance, 3 = high chance   Sitting and Reading? Watching Television? Sitting inactive in a public place (theater or meeting)? As a passenger in a car for an hour without a break? Lying down in the afternoon when circumstances permit? Sitting and talking to someone? Sitting quietly after lunch without alcohol? In a car, while stopped for a few minutes in traffic?   Total = 9/ 24 points   FSS endorsed at N/A/ 63 points.  GDS not answered.   Social History   Socioeconomic History   Marital status: Married    Spouse name: Diane   Number of children: 1   Years of education: 14   Highest education level: Not on file  Occupational History   Occupation: Herbalist: Interior and spatial designer  Tobacco Use   Smoking status: Never   Smokeless tobacco: Never   Tobacco comments:    Second hand smoke for 26 years per pt  Vaping Use   Vaping Use: Never used  Substance and Sexual Activity   Alcohol use: No    Alcohol/week: 0.0 standard drinks   Drug use: No   Sexual activity: Not on file  Other Topics Concern   Not on file  Social History Narrative   Patient is married (Diane) and lives at home with his wife.   Patient has one child.   Patient is working full-time.   Patient has a Geophysicist/field seismologist.   Patient is left-handed.   Patient drinks one glass of tea daily, one soda  daily.   Social Determinants of Health   Financial Resource Strain: Not on file  Food Insecurity: Not on file  Transportation Needs: Not on file  Physical Activity: Not on file  Stress: Not on file  Social Connections: Not on file    Family History  Problem Relation Age of Onset   Coronary artery disease Father    Diabetes type II Father    Heart attack Father     Past Medical History:  Diagnosis Date   Allergic rhinitis, seasonal    Asthma    Bronchitis    Chronic kidney disease, stage I    collar bone    dislocation left side 05/2014   Degenerative joint disease of knee    bilateral   Diabetes mellitus    GERD (gastroesophageal reflux disease)    Heart murmur    history of   History of acute prostatitis    History of hematuria    Hyperlipidemia    Hypertension    IgA nephropathy    OSA on CPAP    Pneumonia    Sinusitis     Past Surgical History:  Procedure Laterality Date   BIOPSY  05/17/2020   Procedure: BIOPSY;  Surgeon: Clarene Essex, MD;  Location: [facility];  Service: Endoscopy;;   CARDIAC CATHETERIZATION  09/29/2011   normal   CERVICAL SPINE SURGERY     COLONOSCOPY WITH PROPOFOL N/A 05/17/2020   Procedure: COLONOSCOPY WITH  PROPOFOL;  Surgeon: Clarene Essex, MD;  Location: [facility];  Service: Endoscopy;  Laterality: N/A;   deviated septum  1930s   ESOPHAGOGASTRODUODENOSCOPY (EGD) WITH PROPOFOL N/A 05/17/2020   Procedure: ESOPHAGOGASTRODUODENOSCOPY (EGD) WITH PROPOFOL;  Surgeon: Clarene Essex, MD;  Location: [facility];  Service: Endoscopy;  Laterality: N/A;   HEMOSTASIS CLIP PLACEMENT  05/17/2020   Procedure: HEMOSTASIS CLIP PLACEMENT;  Surgeon: Clarene Essex, MD;  Location: [facility];  Service: Endoscopy;;   HERNIA REPAIR     LEFT HEART CATHETERIZATION WITH CORONARY ANGIOGRAM N/A 09/29/2011   Procedure: LEFT HEART CATHETERIZATION WITH CORONARY ANGIOGRAM;  Surgeon: Sanda Klein, MD;  Location: [facility];  Service: Cardiovascular;  Laterality: N/A;    NM MYOVIEW LTD  07/08/2011   mild LV dilatation,mild septal hypokinesia   POLYPECTOMY  05/17/2020   Procedure: POLYPECTOMY;  Surgeon: Clarene Essex, MD;  Location: [facility];  Service: Endoscopy;;   TONSILLECTOMY     US ECHOCARDIOGRAPHY  09/07/2011   EF 35-45%,mod.ant hypokinesis,boderline LA & RA enlargement,trace MR,TR & PI     Current Outpatient Medications on File Prior to Visit  Medication Sig Dispense Refill   albuterol (PROVENTIL HFA;VENTOLIN HFA) 108 (90 BASE) MCG/ACT inhaler Inhale 2 puffs into the lungs every 6 (six) hours as needed for wheezing or shortness of breath. For shortness of breath     Ascorbic Acid (VITAMIN C PO) Take 1,000 mg by mouth daily.     aspirin EC 81 MG tablet Take 81 mg by mouth every evening.      chlorpheniramine (CHLOR-TRIMETON) 4 MG tablet Take 4 mg by mouth at bedtime as needed (Sinus drainage).     Cyanocobalamin (VITAMIN B12) 500 MCG TABS Take 500 mcg by mouth daily.     diltiazem (CARDIZEM CD) 240 MG 24 hr capsule Take 240 mg by mouth daily.     DULoxetine HCl 60 MG CSDR Take 60 mg by mouth daily.     ibuprofen (ADVIL,MOTRIN) 200 MG tablet Take 200 mg by mouth daily as needed for headache.     Insulin Degludec (TRESIBA [place]) Inject 80 Units into the skin daily.     irbesartan-hydrochlorothiazide (AVALIDE) 300-12.5 MG tablet Take 1 tablet by mouth every evening.      metFORMIN (GLUCOPHAGE) 1000 MG tablet Take 1 tablet (1,000 mg total) by mouth 2 (two) times daily with a meal. Restart on 8/22 (Patient taking differently: Take 1,000 mg by mouth 2 (two) times daily with a meal.)     metoprolol succinate (TOPROL-XL) 50 MG 24 hr tablet Take 50 mg by mouth 2 (two) times daily. Take with or immediately following a meal.     NOVOLOG FLEXPEN 100 UNIT/ML FlexPen Inject 40-60 Units into the skin 3 (three) times daily before meals. Sliding scale     Omega-3 Fatty Acids (FISH OIL) 1000 MG CPDR Take 2,000 Units by mouth daily.     pantoprazole (PROTONIX) 40 MG tablet Take  40 mg by mouth daily.     pravastatin (PRAVACHOL) 40 MG tablet Take 40 mg by mouth every evening.      memantine (NAMENDA TITRATION PAK) tablet pack 5 mg/day for =1 week; 5 mg twice daily for =1 week; 15 mg/day given in 5 mg and 10 mg separated doses for =1 week; then 10 mg twice daily (Patient not taking: Reported on 10/18/2020) 49 tablet 0   No current facility-administered medications on file prior to visit.    Allergies  Allergen Reactions   Aricept [Donepezil Hcl] Nausea Only  Bactrim [Sulfamethoxazole-Trimethoprim] Hives   Ciprofloxacin     Tendons problem in shoulder   Levofloxacin Other (See Comments)    Muscle tighten up in left and right calf    Physical exam:  Today's Vitals   10/18/20 0917  BP: 124/78  Pulse: 77  Weight: 203 lb (92.1 kg)  Height: 5\' 9"  (1.753 m)   Body mass index is 29.98 kg/m.   Wt Readings from Last 3 Encounters:  10/18/20 203 lb (92.1 kg)  05/04/20 195 lb (88.5 kg)  04/15/20 197 lb (89.4 kg)     Ht Readings from Last 3 Encounters:  10/18/20 5\' 9"  (1.753 m)  05/04/20 5\' 9"  (1.753 m)  04/15/20 5\' 9"  (1.753 m)      General: The patient is awake, alert and appears not in acute distress. The patient is well groomed. Head: Normocephalic, atraumatic. Cardiovascular:  Regular rate and cardiac rhythm by pulse,  without distended neck veins. Respiratory: Lungs are clear to auscultation.  Skin:  Without evidence of ankle edema, or rash. Trunk: The patient's posture is erect.   Neurologic exam : The patient is awake and alert, oriented to place and time.   Memory subjective described as impaired-  Montreal Cognitive Assessment  04/15/2020  Visuospatial/ Executive (0/5) 4  Naming (0/3) 3  Attention: Read list of digits (0/2) 2  Attention: Read list of letters (0/1) 1  Attention: Serial 7 subtraction starting at 100 (0/3) 3  Language: Repeat phrase (0/2) 2  Language : Fluency (0/1) 1  Abstraction (0/2) 2  Delayed Recall (0/5) 2  Orientation  (0/6) 6  Total 26      Attention span & concentration ability appears restricted.  Speech is fluent,  without  dysarthria, dysphonia or aphasia.  Mood and affect are here normal, but wife reports episode of inappropriate anger, blunt comments. .   Cranial nerves: no loss of smell or taste reported  Pupils are equal and briskly reactive to light. Funduscopic exam deferred.  Extraocular movements in vertical and horizontal planes were intact and without nystagmus.  No Diplopia. Visual fields by finger perimetry are intact. Hearing was intact to soft voice and finger rubbing.    Facial sensation intact to fine touch.  Facial motor strength is symmetric and tongue and uvula move midline.  Neck ROM : rotation, tilt and flexion extension were normal for age and ligament damage-shoulder shrug was symmetrical- but he reports shoulder pain, arthritis, having lost arm swing. .    Motor exam:  Symmetric bulk, tone and ROM.   Normal tone without cog- wheeling, symmetric grip strength .   Sensory:  Fine touch and vibration were lost in both feet /ankles.  Proprioception tested in the upper extremities was normal.   Coordination: Rapid alternating movements in the fingers/hands were slowed.  The Finger-to-nose maneuver was intact without evidence of ataxia, dysmetria or tremor.   Gait and station: Patient could rise unassisted from a seated position, walked without assistive device.  Stance is of normal width/ base and the patient turned with 4 steps.  He walked with reduced arm swing on the left versus right  Toe and heel walk were deferred.  Deep tendon reflexes: in the  upper and lower extremities are symmetric and trace.      After spending a total time of 45 minutes face to face and additional time for physical and neurologic examination, review of laboratory studies,  personal review of imaging studies, reports and results of other testing and review of  referral information / records as  far as provided in visit, I have established the following assessments:  1)  He has neuropathy and is at higher fall risk,also slowed in his reaction and coordination movements. Fall risk increased.  2)  IMPULSE CONTROL LOSS-  Neuropsychology reports for this patients, encourage exercise, retesting need for oxygen ( he is on 02 )  in a sleep study and SPECT scan order.  Impulse control is lower than expected, pretty much every cognitive domain was average or even above average except over 20 minute recall and color naming. He is not seen by pulmonology for oxygen need- small vessel disease of the brain may be related.  BRAIN MRI was unchanged -05-15-2020 Dr. Felecia Shelling, small vessel disease. Ordered PET scan, metabolic MRI-  3)  Headaches started now 6 month - No nausea no photophobia. Daily over the counter medication. Helped by   Takes medication OTC every day for almost 6 months now !   My Plan is to proceed with:  1)  Cortical Volumetric FDG PET/ SPECTI.   2)  He did not tolerate Aricept - 5 mg po, will try namenda next. He has already received the Namenda dose pack, will maintain on 10 mg bid.  3)  Referral to CB therapy. 4)  Encourage hydration, exercises, supportive shoes. Water exercise ?  5. STEROIDS helped all aches and pains and headaches.  Check CR-protein and sedimentation- rate.   I would like to thank  Prince Solian, [place],  [place] [address] for allowing me to meet with and to take care of this pleasant patient.   In short, Donald [name] is presenting with slowly progressing memory loss.   I plan to follow up either personally or through our NP within 6 month. MOCA.     Electronically signed by: Larey Seat, MD 10/18/2020 9:28 AM  [facility] Board certified by The [employer] of Sleep Medicine and Diplomate of the [employer] of Sleep Medicine. Board certified In Neurology through the [place], Fellow of  the [employer] of Neurology. Medical Director of [facility].

## 2020-10-19 NOTE — Telephone Encounter (Signed)
Insurance will not pay for PET scan amyloid. Per GI, this can not be done there and this can not be done at any [facility]. [facility] advised that this possibly be sent to [facility] or [place], but it will not be covered by insurance. Do you want [place] to proceed with scheduling at [facility] or [place]?

## 2020-10-20 ENCOUNTER — Telehealth: Payer: Self-pay | Admitting: Neurology

## 2020-10-20 DIAGNOSIS — G309 Alzheimer's disease, unspecified: Secondary | ICD-10-CM

## 2020-10-20 NOTE — Telephone Encounter (Signed)
Happy with either place , who can serve him first.

## 2020-10-20 NOTE — Telephone Encounter (Signed)
I was told by my office staff that the BRAIN PET scan would not be offered in [place], I will reach out to the radiologist who worked on these before to see if there is an alternative or if this is a misunderstanding. Cc Gerline Legacy , RN

## 2020-10-20 NOTE — Progress Notes (Signed)
high HbA1c, high RDW, high ALT, normal C reactive protein and sed rate. Can have PET scan.

## 2020-10-21 ENCOUNTER — Other Ambulatory Visit: Payer: Self-pay | Admitting: Neurology

## 2020-10-21 ENCOUNTER — Telehealth: Payer: Self-pay | Admitting: Neurology

## 2020-10-21 DIAGNOSIS — G309 Alzheimer's disease, unspecified: Secondary | ICD-10-CM

## 2020-10-21 DIAGNOSIS — G3183 Dementia with Lewy bodies: Secondary | ICD-10-CM

## 2020-10-21 NOTE — Telephone Encounter (Signed)
Called the wife and reviewed the lab results with her.  She verbalized understanding.  Advised there are a couple that are still pending and if they are abnormal I will be in contact. Informed her the PET scan that was originally ordered by Dr. Brett Fairy was not covered through his insurance however Dr. Brett Fairy did agree to order the one we normally would do and that 1 should not have problems getting covered.  Advised that once we get insurance authorization someone will call to schedule him for that.  She verbalized understanding and had no further questions at this time.

## 2020-10-21 NOTE — Telephone Encounter (Signed)
Ordered the metabolic PET scan. This will be covered through the patient's insurance and will be completed at [facility]. Order is in.

## 2020-10-21 NOTE — Telephone Encounter (Signed)
-----   Message from Larey Seat, MD sent at 10/20/2020  5:53 PM EDT -----  high HbA1c, high RDW, high ALT, normal C reactive protein and sed rate. Can have PET scan.

## 2020-10-21 NOTE — Addendum Note (Signed)
Addended by: Darleen Crocker on: 10/21/2020 09:50 AM   Modules accepted: Orders

## 2020-10-22 LAB — HOMOCYSTEINE: Homocysteine: 9.3 umol/L (ref 0.0–17.2)

## 2020-10-22 LAB — SJOGREN'S SYNDROME ANTIBODS(SSA + SSB)
ENA SSA (RO) Ab: <0.2 AI (ref 0.0–0.9)
ENA SSB (LA) Ab: <0.2 AI (ref 0.0–0.9)

## 2020-10-22 LAB — MULTIPLE MYELOMA PANEL, SERUM
Albumin SerPl Elph-Mcnc: 3.6 g/dL (ref 2.9–4.4)
Albumin/Glob SerPl: 1.3 (ref 0.7–1.7)
Alpha 1: 0.2 g/dL (ref 0.0–0.4)
Alpha2 Glob SerPl Elph-Mcnc: 1 g/dL (ref 0.4–1.0)
B-Globulin SerPl Elph-Mcnc: 1.1 g/dL (ref 0.7–1.3)
Gamma Glob SerPl Elph-Mcnc: 0.7 g/dL (ref 0.4–1.8)
Globulin, Total: 3 g/dL (ref 2.2–3.9)
IgA/Immunoglobulin A, Serum: 310 mg/dL (ref 61–437)
IgG (Immunoglobin G), Serum: 796 mg/dL (ref 603–1613)
IgM (Immunoglobulin M), Srm: 68 mg/dL (ref 20–172)

## 2020-10-22 LAB — CBC WITH DIFFERENTIAL/PLATELET
Basophils Absolute: 0.1 10*3/uL (ref 0.0–0.2)
Basos: 1 %
EOS (ABSOLUTE): 0.4 10*3/uL (ref 0.0–0.4)
Eos: 5 %
Hematocrit: 42.5 % (ref 37.5–51.0)
Hemoglobin: 13.4 g/dL (ref 13.0–17.7)
Immature Grans (Abs): 0 10*3/uL (ref 0.0–0.1)
Immature Granulocytes: 1 %
Lymphocytes Absolute: 1.9 10*3/uL (ref 0.7–3.1)
Lymphs: 24 %
MCH: 25.9 pg — ABNORMAL LOW (ref 26.6–33.0)
MCHC: 31.5 g/dL (ref 31.5–35.7)
MCV: 82 fL (ref 79–97)
Monocytes Absolute: 0.7 10*3/uL (ref 0.1–0.9)
Monocytes: 9 %
Neutrophils Absolute: 4.9 10*3/uL (ref 1.4–7.0)
Neutrophils: 60 %
Platelets: 309 10*3/uL (ref 150–450)
RBC: 5.18 x10E6/uL (ref 4.14–5.80)
RDW: 16.7 % — ABNORMAL HIGH (ref 11.6–15.4)
WBC: 8.1 10*3/uL (ref 3.4–10.8)

## 2020-10-22 LAB — COMPREHENSIVE METABOLIC PANEL
ALT: 60 IU/L — ABNORMAL HIGH (ref 0–44)
AST: 24 IU/L (ref 0–40)
Albumin/Globulin Ratio: 1.5 (ref 1.2–2.2)
Albumin: 4 g/dL (ref 3.8–4.8)
Alkaline Phosphatase: 50 IU/L (ref 44–121)
BUN/Creatinine Ratio: 25 — ABNORMAL HIGH (ref 10–24)
BUN: 26 mg/dL (ref 8–27)
Bilirubin Total: <0.2 mg/dL (ref 0.0–1.2)
CO2: 26 mmol/L (ref 20–29)
Calcium: 9.7 mg/dL (ref 8.6–10.2)
Chloride: 98 mmol/L (ref 96–106)
Creatinine, Ser: 1.06 mg/dL (ref 0.76–1.27)
Globulin, Total: 2.6 g/dL (ref 1.5–4.5)
Glucose: 127 mg/dL — ABNORMAL HIGH (ref 65–99)
Potassium: 4.1 mmol/L (ref 3.5–5.2)
Sodium: 142 mmol/L (ref 134–144)
Total Protein: 6.6 g/dL (ref 6.0–8.5)
eGFR: 76 mL/min/{1.73_m2} (ref >59)

## 2020-10-22 LAB — METHYLMALONIC ACID, SERUM: Methylmalonic Acid: 253 nmol/L (ref 0–378)

## 2020-10-22 LAB — HEMOGLOBIN A1C
Est. average glucose Bld gHb Est-mCnc: 192 mg/dL
Hgb A1c MFr Bld: 8.3 % — ABNORMAL HIGH (ref 4.8–5.6)

## 2020-10-22 LAB — C-REACTIVE PROTEIN: CRP: 11 mg/L — ABNORMAL HIGH (ref 0–10)

## 2020-10-22 LAB — SEDIMENTATION RATE: Sed Rate: 25 mm/hr (ref 0–30)

## 2020-10-22 LAB — TSH: TSH: 1.64 u[IU]/mL (ref 0.450–4.500)

## 2020-10-22 NOTE — Progress Notes (Signed)
Negative SSA/ test for [employer]

## 2020-10-25 LAB — ANA W/REFLEX: ANA Titer 1: NEGATIVE

## 2020-10-25 NOTE — Telephone Encounter (Signed)
PET scan ready to be scheduled. No auth required per [employer]. Sent to [facility].

## 2020-11-01 ENCOUNTER — Ambulatory Visit (INDEPENDENT_AMBULATORY_CARE_PROVIDER_SITE_OTHER): Payer: HMO | Admitting: Psychology

## 2020-11-01 DIAGNOSIS — F331 Major depressive disorder, recurrent, moderate: Secondary | ICD-10-CM

## 2020-11-04 ENCOUNTER — Other Ambulatory Visit: Payer: Self-pay

## 2020-11-04 ENCOUNTER — Ambulatory Visit (HOSPITAL_COMMUNITY)
Admission: RE | Admit: 2020-11-04 | Discharge: 2020-11-04 | Disposition: A | Discharge status: Home / Self Care | Payer: HMO | Source: Ambulatory Visit | Attending: Neurology | Admitting: Neurology

## 2020-11-04 DIAGNOSIS — G309 Alzheimer's disease, unspecified: Secondary | ICD-10-CM | POA: Diagnosis not present

## 2020-11-04 DIAGNOSIS — F0390 Unspecified dementia without behavioral disturbance: Secondary | ICD-10-CM | POA: Diagnosis not present

## 2020-11-04 DIAGNOSIS — G3183 Dementia with Lewy bodies: Secondary | ICD-10-CM | POA: Diagnosis not present

## 2020-11-04 MED ORDER — FLUDEOXYGLUCOSE F - 18 (FDG) INJECTION
10.0000 | Freq: Once | INTRAVENOUS | Status: AC | PRN
  Administered 2020-11-04: 07:00:00 10 via INTRAVENOUS

## 2020-11-05 DIAGNOSIS — R310 Gross hematuria: Secondary | ICD-10-CM | POA: Diagnosis not present

## 2020-11-05 DIAGNOSIS — R8279 Other abnormal findings on microbiological examination of urine: Secondary | ICD-10-CM | POA: Diagnosis not present

## 2020-11-05 NOTE — Progress Notes (Signed)
Good result for not having a region of abnormal cortical metabolism. No finding suggestive of a dementia of frontotemporal, alzheimer or Lewy body type.

## 2020-11-10 ENCOUNTER — Telehealth: Payer: Self-pay | Admitting: Neurology

## 2020-11-10 NOTE — Telephone Encounter (Signed)
Called the patient's wife and was able to review the PET scan results with her. Pt is scheduled in November for a follow up apt. Advised we will continue to screen memory at upcoming visits. Pt's wife verbalized understanding and had no further questions.

## 2020-11-10 NOTE — Telephone Encounter (Signed)
-----   Message from Larey Seat, MD sent at 11/05/2020 11:16 AM EDT ----- Good result for not having a region of abnormal cortical metabolism. No finding suggestive of a dementia of frontotemporal, alzheimer or Lewy body type.

## 2020-11-10 NOTE — Telephone Encounter (Signed)
Pt's wife Diane on [place] wanted to see if dr could call back with the results of PET scan. Please advise.

## 2020-11-17 ENCOUNTER — Ambulatory Visit (INDEPENDENT_AMBULATORY_CARE_PROVIDER_SITE_OTHER): Payer: HMO | Admitting: Psychology

## 2020-11-17 ENCOUNTER — Other Ambulatory Visit: Payer: Self-pay | Admitting: *Deleted

## 2020-11-17 ENCOUNTER — Telehealth: Payer: Self-pay | Admitting: Neurology

## 2020-11-17 DIAGNOSIS — F321 Major depressive disorder, single episode, moderate: Secondary | ICD-10-CM | POA: Diagnosis not present

## 2020-11-17 MED ORDER — MEMANTINE HCL 10 MG PO TABS: 10.0000 mg | ORAL_TABLET | Freq: Two times a day (BID) | ORAL | 11 refills | Status: DC

## 2020-11-17 NOTE — Telephone Encounter (Signed)
E-scribed refill as requested. 

## 2020-11-17 NOTE — Telephone Encounter (Signed)
Pt's wife called stating that the pt will be finishing his memantine (NAMENDA TITRATION PAK) tablet pack in two days and they are needing a RX sent in to the [facility] on [address]

## 2020-11-19 DIAGNOSIS — K4090 Unilateral inguinal hernia, without obstruction or gangrene, not specified as recurrent: Secondary | ICD-10-CM | POA: Diagnosis not present

## 2020-11-19 DIAGNOSIS — K5730 Diverticulosis of large intestine without perforation or abscess without bleeding: Secondary | ICD-10-CM | POA: Diagnosis not present

## 2020-11-19 DIAGNOSIS — R310 Gross hematuria: Secondary | ICD-10-CM | POA: Diagnosis not present

## 2020-11-19 DIAGNOSIS — N281 Cyst of kidney, acquired: Secondary | ICD-10-CM | POA: Diagnosis not present

## 2020-11-19 DIAGNOSIS — N3289 Other specified disorders of bladder: Secondary | ICD-10-CM | POA: Diagnosis not present

## 2020-11-23 DIAGNOSIS — H33102 Unspecified retinoschisis, left eye: Secondary | ICD-10-CM | POA: Diagnosis not present

## 2020-11-23 DIAGNOSIS — E119 Type 2 diabetes mellitus without complications: Secondary | ICD-10-CM | POA: Diagnosis not present

## 2020-11-23 DIAGNOSIS — Z961 Presence of intraocular lens: Secondary | ICD-10-CM | POA: Diagnosis not present

## 2020-11-23 DIAGNOSIS — H5213 Myopia, bilateral: Secondary | ICD-10-CM | POA: Diagnosis not present

## 2020-11-25 DIAGNOSIS — N182 Chronic kidney disease, stage 2 (mild): Secondary | ICD-10-CM | POA: Diagnosis not present

## 2020-11-25 DIAGNOSIS — E785 Hyperlipidemia, unspecified: Secondary | ICD-10-CM | POA: Diagnosis not present

## 2020-11-25 DIAGNOSIS — I129 Hypertensive chronic kidney disease with stage 1 through stage 4 chronic kidney disease, or unspecified chronic kidney disease: Secondary | ICD-10-CM | POA: Diagnosis not present

## 2020-11-25 DIAGNOSIS — Z794 Long term (current) use of insulin: Secondary | ICD-10-CM | POA: Diagnosis not present

## 2020-11-25 DIAGNOSIS — E118 Type 2 diabetes mellitus with unspecified complications: Secondary | ICD-10-CM | POA: Diagnosis not present

## 2020-12-01 ENCOUNTER — Ambulatory Visit (INDEPENDENT_AMBULATORY_CARE_PROVIDER_SITE_OTHER): Payer: HMO | Admitting: Psychology

## 2020-12-01 DIAGNOSIS — F331 Major depressive disorder, recurrent, moderate: Secondary | ICD-10-CM

## 2020-12-02 DIAGNOSIS — R3912 Poor urinary stream: Secondary | ICD-10-CM | POA: Diagnosis not present

## 2020-12-02 DIAGNOSIS — R310 Gross hematuria: Secondary | ICD-10-CM | POA: Diagnosis not present

## 2020-12-02 DIAGNOSIS — N390 Urinary tract infection, site not specified: Secondary | ICD-10-CM | POA: Diagnosis not present

## 2020-12-02 DIAGNOSIS — N401 Enlarged prostate with lower urinary tract symptoms: Secondary | ICD-10-CM | POA: Diagnosis not present

## 2020-12-22 ENCOUNTER — Ambulatory Visit (INDEPENDENT_AMBULATORY_CARE_PROVIDER_SITE_OTHER): Payer: HMO | Admitting: Psychology

## 2020-12-22 DIAGNOSIS — K219 Gastro-esophageal reflux disease without esophagitis: Secondary | ICD-10-CM | POA: Diagnosis not present

## 2020-12-22 DIAGNOSIS — E118 Type 2 diabetes mellitus with unspecified complications: Secondary | ICD-10-CM | POA: Diagnosis not present

## 2020-12-22 DIAGNOSIS — R051 Acute cough: Secondary | ICD-10-CM | POA: Diagnosis not present

## 2020-12-22 DIAGNOSIS — F331 Major depressive disorder, recurrent, moderate: Secondary | ICD-10-CM

## 2020-12-22 DIAGNOSIS — G4733 Obstructive sleep apnea (adult) (pediatric): Secondary | ICD-10-CM | POA: Diagnosis not present

## 2020-12-22 DIAGNOSIS — J302 Other seasonal allergic rhinitis: Secondary | ICD-10-CM | POA: Diagnosis not present

## 2020-12-22 DIAGNOSIS — Z1152 Encounter for screening for COVID-19: Secondary | ICD-10-CM | POA: Diagnosis not present

## 2020-12-22 DIAGNOSIS — J45909 Unspecified asthma, uncomplicated: Secondary | ICD-10-CM | POA: Diagnosis not present

## 2021-01-05 ENCOUNTER — Ambulatory Visit (INDEPENDENT_AMBULATORY_CARE_PROVIDER_SITE_OTHER): Payer: HMO | Admitting: Psychology

## 2021-01-05 DIAGNOSIS — F331 Major depressive disorder, recurrent, moderate: Secondary | ICD-10-CM | POA: Diagnosis not present

## 2021-01-21 ENCOUNTER — Ambulatory Visit (INDEPENDENT_AMBULATORY_CARE_PROVIDER_SITE_OTHER): Payer: HMO | Admitting: Psychology

## 2021-01-21 DIAGNOSIS — F331 Major depressive disorder, recurrent, moderate: Secondary | ICD-10-CM

## 2021-01-26 NOTE — Progress Notes (Signed)
Synopsis: Referred for acute cough by Prince Solian, MD  Subjective:   PATIENT ID: Donald [name] GENDER: male DOB: 12-14-1950, MRN: [id number]  Chief Complaint  Patient presents with   Consult    Referred for cough, feels something stuck in throat   69yM with history of UACS vs asthma followed in our clinic last seen 2016, GERD, sleep-disordered breathing on CPAP and O2, referred for acute cough.  Saw ENT at [facility] 2016, they felt cough/arytenoid edema caused by GERD, LPR.  He has had cough which he thinks is related to something in his throat. He has had neck surgery in past for herniated disks - 10 years ago. He also has elevated left collarbone related to prior injury and healing and still wonders if it's related to his cough. It is not productive. There is positional component to his cough where if he flexes his neck it's worse. He has no trouble swallowing. No real trouble with choking/aspirating on food/liquids.   He does think a prednisone course helped a little bit.  He is still taking protonix 40 mg once daily in AM right before breakfast.   Very frequent sensation of PND. Doesn't feel sinonasal congestion. Had deviated septum surgery in 1980s/90s.  He has mild DOE which he attributes to asthma - has not worsened since visit in 2016. Takes albuterol intermittently.   Otherwise pertinent review of systems is negative.  No family history of lung disease  He has secondhand smoke exposure.   He was a Dance movement psychotherapist, worked with Veterinary surgeon. 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 Had frequent dust/particulate exposure without mask back then (last in 1981). Has beagle at home. No pet birds. He has no hot tub. Lived in [place] for some time, [place].    Past Medical History:  Diagnosis Date   Allergic rhinitis, seasonal    Asthma    Bronchitis    Chronic kidney disease, stage I    collar bone    dislocation left side 05/2014   Degenerative joint  disease of knee    bilateral   Diabetes mellitus    GERD (gastroesophageal reflux disease)    Heart murmur    history of   History of acute prostatitis    History of hematuria    Hyperlipidemia    Hypertension    IgA nephropathy    OSA on CPAP    Pneumonia    Sinusitis      Family History  Problem Relation Age of Onset   Coronary artery disease Father    Diabetes type II Father    Heart attack Father      Past Surgical History:  Procedure Laterality Date   BIOPSY  05/17/2020   Procedure: BIOPSY;  Surgeon: Clarene Essex, MD;  Location: [facility];  Service: Endoscopy;;   CARDIAC CATHETERIZATION  09/29/2011   normal   CERVICAL SPINE SURGERY     COLONOSCOPY WITH PROPOFOL N/A 05/17/2020   Procedure: COLONOSCOPY WITH PROPOFOL;  Surgeon: Clarene Essex, MD;  Location: [facility];  Service: Endoscopy;  Laterality: N/A;   deviated septum  1930s   ESOPHAGOGASTRODUODENOSCOPY (EGD) WITH PROPOFOL N/A 05/17/2020   Procedure: ESOPHAGOGASTRODUODENOSCOPY (EGD) WITH PROPOFOL;  Surgeon: Clarene Essex, MD;  Location: [facility];  Service: Endoscopy;  Laterality: N/A;   HEMOSTASIS CLIP PLACEMENT  05/17/2020   Procedure: HEMOSTASIS CLIP PLACEMENT;  Surgeon: Clarene Essex, MD;  Location: [facility];  Service: Endoscopy;;   HERNIA REPAIR     LEFT HEART CATHETERIZATION  WITH CORONARY ANGIOGRAM N/A 09/29/2011   Procedure: LEFT HEART CATHETERIZATION WITH CORONARY ANGIOGRAM;  Surgeon: Sanda Klein, MD;  Location: [facility];  Service: Cardiovascular;  Laterality: N/A;   NM MYOVIEW LTD  07/08/2011   mild LV dilatation,mild septal hypokinesia   POLYPECTOMY  05/17/2020   Procedure: POLYPECTOMY;  Surgeon: Clarene Essex, MD;  Location: [facility];  Service: Endoscopy;;   TONSILLECTOMY     US ECHOCARDIOGRAPHY  09/07/2011   EF 35-45%,mod.ant hypokinesis,boderline LA & RA enlargement,trace MR,TR & PI    Social History   Socioeconomic History   Marital status: Married    Spouse name: Diane   Number of  children: 1   Years of education: 14   Highest education level: Not on file  Occupational History   Occupation: Herbalist: Interior and spatial designer  Tobacco Use   Smoking status: Never   Smokeless tobacco: Never   Tobacco comments:    Second hand smoke for 26 years per pt  Vaping Use   Vaping Use: Never used  Substance and Sexual Activity   Alcohol use: No    Alcohol/week: 0.0 standard drinks   Drug use: No   Sexual activity: Not on file  Other Topics Concern   Not on file  Social History Narrative   Patient is married (Diane) and lives at home with his wife.   Patient has one child.   Patient is working full-time.   Patient has a Geophysicist/field seismologist.   Patient is left-handed.   Patient drinks one glass of tea daily, one soda daily.   Social Determinants of Health   Financial Resource Strain: Not on file  Food Insecurity: Not on file  Transportation Needs: Not on file  Physical Activity: Not on file  Stress: Not on file  Social Connections: Not on file  Intimate Partner Violence: Not on file     Allergies  Allergen Reactions   Aricept [Donepezil Hcl] Nausea Only   Bactrim [Sulfamethoxazole-Trimethoprim] Hives   Ciprofloxacin     Tendons problem in shoulder   Levofloxacin Other (See Comments)    Muscle tighten up in left and right calf     Outpatient Medications Prior to Visit  Medication Sig Dispense Refill   albuterol (PROVENTIL HFA;VENTOLIN HFA) 108 (90 BASE) MCG/ACT inhaler Inhale 2 puffs into the lungs every 6 (six) hours as needed for wheezing or shortness of breath. For shortness of breath     Ascorbic Acid (VITAMIN C PO) Take 1,000 mg by mouth daily.     aspirin EC 81 MG tablet Take 81 mg by mouth every evening.     Cyanocobalamin (VITAMIN B12) 500 MCG TABS Take 500 mcg by mouth daily.     diltiazem (CARDIZEM CD) 240 MG 24 hr capsule Take 240 mg by mouth daily.     DULoxetine HCl 60 MG CSDR Take 60 mg by mouth daily.     HYDROcodone-acetaminophen  (NORCO/VICODIN) 5-325 MG tablet Take one tablet by mouth every 6 hours for severe pain Dx: R07.51     Insulin Degludec (TRESIBA Foster) Inject 80 Units into the skin daily.     irbesartan-hydrochlorothiazide (AVALIDE) 300-12.5 MG tablet Take 1 tablet by mouth every evening.      memantine (NAMENDA TITRATION PAK) tablet pack 5 mg/day for =1 week; 5 mg twice daily for =1 week; 15 mg/day given in 5 mg and 10 mg separated doses for =1 week; then 10 mg twice daily 49 tablet 0   memantine (NAMENDA) 10  MG tablet Take 1 tablet (10 mg total) by mouth 2 (two) times daily. 60 tablet 11   metFORMIN (GLUCOPHAGE) 1000 MG tablet Take 1 tablet (1,000 mg total) by mouth 2 (two) times daily with a meal. Restart on 8/22 (Patient taking differently: Take 1,000 mg by mouth 2 (two) times daily with a meal.)     metoprolol succinate (TOPROL-XL) 50 MG 24 hr tablet Take 50 mg by mouth 2 (two) times daily. Take with or immediately following a meal.     Omega-3 Fatty Acids (FISH OIL) 1000 MG CPDR Take 2,000 Units by mouth daily.     pantoprazole (PROTONIX) 40 MG tablet Take 40 mg by mouth daily.     ibuprofen (ADVIL,MOTRIN) 200 MG tablet Take 200 mg by mouth daily as needed for headache. (Patient not taking: Reported on 01/27/2021)     chlorpheniramine (CHLOR-TRIMETON) 4 MG tablet Take 4 mg by mouth at bedtime as needed (Sinus drainage). (Patient not taking: Reported on 01/27/2021)     NOVOLOG FLEXPEN 100 UNIT/ML FlexPen Inject 40-60 Units into the skin 3 (three) times daily before meals. Sliding scale (Patient not taking: Reported on 01/27/2021)     pravastatin (PRAVACHOL) 40 MG tablet Take 40 mg by mouth every evening.  (Patient not taking: Reported on 01/27/2021)     No facility-administered medications prior to visit.       Objective:   Physical Exam:  General appearance: 70 y.o., male, NAD, conversant  Eyes: anicteric sclerae, moist conjunctivae; no lid-lag; PERRL, tracking appropriately HENT: NCAT; oropharynx, MMM, no  mucosal ulcerations; normal hard and soft palate Neck: Trachea midline; no lymphadenopathy, no JVD Lungs: CTAB, no crackles, no wheeze, with normal respiratory effort CV: RRR, no MRGs  Abdomen: Soft, non-tender; non-distended, BS present  Extremities: No peripheral edema, radial and DP pulses present bilaterally  Skin: Normal temperature, turgor and texture; no rash Psych: Appropriate affect Neuro: Alert and oriented to person and place, no focal deficit    Vitals:   01/27/21 1123  BP: 138/74  Pulse: 80  Temp: 98.2 F (36.8 C)  TempSrc: Oral  SpO2: 92%  Weight: 209 lb 6.4 oz (95 kg)  Height: $Remove'5\' 9"'PUKzMEr$  (1.753 m)   92% on RA BMI Readings from Last 3 Encounters:  01/27/21 30.92 kg/m  10/18/20 29.98 kg/m  05/04/20 28.80 kg/m   Wt Readings from Last 3 Encounters:  01/27/21 209 lb 6.4 oz (95 kg)  10/18/20 203 lb (92.1 kg)  05/04/20 195 lb (88.5 kg)     CBC    Component Value Date/Time   WBC 8.1 10/18/2020 1033   WBC 10.8 (H) 12/26/2018 0048   RBC 5.18 10/18/2020 1033   RBC 5.13 12/26/2018 0048   HGB 13.4 10/18/2020 1033   HCT 42.5 10/18/2020 1033   PLT 309 10/18/2020 1033   MCV 82 10/18/2020 1033   MCH 25.9 (L) 10/18/2020 1033   MCH 29.6 12/26/2018 0048   MCHC 31.5 10/18/2020 1033   MCHC 33.9 12/26/2018 0048   RDW 16.7 (H) 10/18/2020 1033   LYMPHSABS 1.9 10/18/2020 1033   MONOABS 0.7 12/25/2018 1500   EOSABS 0.4 10/18/2020 1033   BASOSABS 0.1 10/18/2020 1033    Low level eosinophilia historically Broad autoimmune panel, HP panel negative in 2016  Chest Imaging:   CXR 2020 reviewed by me and unremarkable  CT A/P 2020 reviewed by me, lung bases unremarkable  CT Chest 2016 reviewed by me and unremarkable  Pulmonary Functions Testing Results: PFT Results Latest Ref Rng &  Units 06/17/2014  FVC-Pre L 3.30  FVC-Predicted Pre % 75  FVC-Post L 3.49  FVC-Predicted Post % 79  Pre FEV1/FVC % % 73  Post FEV1/FCV % % 77  FEV1-Pre L 2.42  FEV1-Predicted Pre % 73   FEV1-Post L 2.69  DLCO uncorrected ml/min/mmHg 21.18  DLCO UNC% % 71  DLVA Predicted % 94  TLC L 5.18  TLC % Predicted % 78  RV % Predicted % 83   Mixed mild obstruction and restriction without air trapping and with borderline bronchodilator response. Mildly reduced dlco.  CPET 07/2014: Stopped due to dyspnea and leg fatigue MVV 88 (67% pred) pre exercise Peak vo2 just under normal for ibw (84% ibw pred) HRR 7 bpm  Peak HR 151 Peak RR 38 Peak V: 69.5 BR of 18.5 Ve/MVV: 79%   Echocardiogram:   TTE 09/2011 with EF 35-45%      Assessment & Plan:   # Acute on chronic cough: # Mixed obstructive and restrictive ventilatory defect, mildly reduced diffusing capacity: # GERD/LPR Etiology of cough may be multifactorial. Consider UACS/PND which sounds prominent currently, LPR/GERD, longstanding asthma or COPD related to secondhand smoke exposure. Does also appear volume overloaded. And he's pretty insistent that his cough is positional where he really feels a lot of impingement from his left clavicle with flexion with worse cough -this indeed seems pretty crowded on exam especially with flexion.  # Bilateral lower extremity edema # History of reduced LV systolic function Extravascularly volume overloaded on exam with 2+ BLE edema.  # Chronic hypoxic respiratory failure: On 2L with sleep. Has been attributed to sleep disordered breathing but wonder if volume overload/CHF contributory.  Plan: - PFTs (breathing tests) - request copy of CXR from Dr. Danna Hefty office this June - we will schedule CT neck - start flonase everyday each nostril after clearing nose following shower or neti pot saline packet irrigation - keep taking protonix everyday in morning 30 minutes before eating - BNP, TTE (echocardiogram for swelling)      Maryjane Hurter, MD [facility] 01/27/2021 11:30 AM

## 2021-01-27 ENCOUNTER — Encounter: Payer: Self-pay | Admitting: Student

## 2021-01-27 ENCOUNTER — Other Ambulatory Visit: Payer: Self-pay

## 2021-01-27 ENCOUNTER — Ambulatory Visit: Payer: HMO | Admitting: Student

## 2021-01-27 VITALS — BP 138/74 | HR 80 | Temp 98.2°F | Ht 69.0 in | Wt 209.4 lb

## 2021-01-27 DIAGNOSIS — J849 Interstitial pulmonary disease, unspecified: Secondary | ICD-10-CM

## 2021-01-27 DIAGNOSIS — R053 Chronic cough: Secondary | ICD-10-CM | POA: Diagnosis not present

## 2021-01-27 DIAGNOSIS — I5022 Chronic systolic (congestive) heart failure: Secondary | ICD-10-CM | POA: Diagnosis not present

## 2021-01-27 DIAGNOSIS — R942 Abnormal results of pulmonary function studies: Secondary | ICD-10-CM

## 2021-01-27 DIAGNOSIS — R600 Localized edema: Secondary | ICD-10-CM

## 2021-01-27 LAB — BRAIN NATRIURETIC PEPTIDE: Pro B Natriuretic peptide (BNP): 13 pg/mL (ref 0.0–100.0)

## 2021-01-27 NOTE — Patient Instructions (Addendum)
-   PFTs (breathing tests) - request copy of CXR from Dr. Danna Hefty office this June - we will schedule CT neck - start flonase everyday each nostril after clearing nose following shower or neti pot saline packet irrigation - keep taking protonix everyday in morning 30 minutes before eating - TTE (echocardiogram for swelling) - see you in 4 months

## 2021-01-28 DIAGNOSIS — N401 Enlarged prostate with lower urinary tract symptoms: Secondary | ICD-10-CM | POA: Diagnosis not present

## 2021-01-28 DIAGNOSIS — N411 Chronic prostatitis: Secondary | ICD-10-CM | POA: Diagnosis not present

## 2021-01-28 DIAGNOSIS — R3915 Urgency of urination: Secondary | ICD-10-CM | POA: Diagnosis not present

## 2021-01-28 DIAGNOSIS — R8279 Other abnormal findings on microbiological examination of urine: Secondary | ICD-10-CM | POA: Diagnosis not present

## 2021-01-31 ENCOUNTER — Ambulatory Visit: Payer: HMO | Admitting: Psychology

## 2021-02-03 ENCOUNTER — Telehealth: Payer: Self-pay | Admitting: Student

## 2021-02-03 NOTE — Telephone Encounter (Signed)
Rec'd a CD in the mail from [facility].  Patient was seen on 9/22 by Dr. Verlee Monte and has a PFT scheduled for 10/12.  Put CD in Dr. Glenetta Hew box.

## 2021-02-07 ENCOUNTER — Telehealth: Payer: Self-pay | Admitting: Neurology

## 2021-02-07 ENCOUNTER — Ambulatory Visit (INDEPENDENT_AMBULATORY_CARE_PROVIDER_SITE_OTHER): Payer: HMO | Admitting: Psychology

## 2021-02-07 ENCOUNTER — Other Ambulatory Visit: Payer: Self-pay | Admitting: *Deleted

## 2021-02-07 DIAGNOSIS — G4733 Obstructive sleep apnea (adult) (pediatric): Secondary | ICD-10-CM

## 2021-02-07 DIAGNOSIS — F331 Major depressive disorder, recurrent, moderate: Secondary | ICD-10-CM | POA: Diagnosis not present

## 2021-02-07 DIAGNOSIS — Z9989 Dependence on other enabling machines and devices: Secondary | ICD-10-CM

## 2021-02-07 NOTE — Telephone Encounter (Signed)
FYI- Pt's wife called back. Would like to use the [facility] in [facility]

## 2021-02-07 NOTE — Telephone Encounter (Signed)
Called wife back. They do not have current DME. Advised we will send order for him to get set up for new machine to [facility]. Phone: [phone]. They will contact them in the next couple weeks. If they do not hear, they should call them. Scheduled initial cpap f/u for new machine on 05/10/20 at 3:30p w/ Dr. Brett Fairy. Asked they make sure to bring new machine/cord w/ them to this appt. Wife wanted to combine memory f/u with cpap f/u. Cx appt on 03/09/21 since he will be coming 05/10/20 now.

## 2021-02-07 NOTE — Telephone Encounter (Signed)
Noted. We sent order to [facility].

## 2021-02-07 NOTE — Telephone Encounter (Signed)
Pt's wife Diane called states her husbands CPAP machine is giving a message saying Motor is at end of life. Wife states needs a new prescription for a new machine.

## 2021-02-08 ENCOUNTER — Other Ambulatory Visit: Payer: Self-pay | Admitting: Neurology

## 2021-02-09 LAB — SARS CORONAVIRUS 2 (TAT 6-24 HRS): SARS Coronavirus 2: NEGATIVE

## 2021-02-09 NOTE — Progress Notes (Signed)
Negative corona PCR test

## 2021-02-11 ENCOUNTER — Other Ambulatory Visit: Payer: Self-pay

## 2021-02-11 ENCOUNTER — Ambulatory Visit (INDEPENDENT_AMBULATORY_CARE_PROVIDER_SITE_OTHER): Payer: HMO | Admitting: Student

## 2021-02-11 ENCOUNTER — Telehealth: Payer: Self-pay | Admitting: Student

## 2021-02-11 DIAGNOSIS — R942 Abnormal results of pulmonary function studies: Secondary | ICD-10-CM

## 2021-02-11 DIAGNOSIS — R053 Chronic cough: Secondary | ICD-10-CM

## 2021-02-11 LAB — PULMONARY FUNCTION TEST
DL/VA % pred: 100 %
DL/VA: 4.12 ml/min/mmHg/L
DLCO cor % pred: 80 %
DLCO cor: 19.8 ml/min/mmHg
DLCO unc % pred: 80 %
DLCO unc: 19.8 ml/min/mmHg
FEF 25-75 Post: 2.39 L/sec
FEF 25-75 Pre: 1.58 L/sec
FEF2575-%Change-Post: 51 %
FEF2575-%Pred-Post: 99 %
FEF2575-%Pred-Pre: 66 %
FEV1-%Change-Post: 12 %
FEV1-%Pred-Post: 75 %
FEV1-%Pred-Pre: 66 %
FEV1-Post: 2.32 L
FEV1-Pre: 2.05 L
FEV1FVC-%Change-Post: 4 %
FEV1FVC-%Pred-Pre: 98 %
FEV6-%Change-Post: 8 %
FEV6-%Pred-Post: 77 %
FEV6-%Pred-Pre: 71 %
FEV6-Post: 3.04 L
FEV6-Pre: 2.81 L
FEV6FVC-%Pred-Post: 105 %
FEV6FVC-%Pred-Pre: 105 %
FVC-%Change-Post: 8 %
FVC-%Pred-Post: 73 %
FVC-%Pred-Pre: 67 %
FVC-Post: 3.04 L
FVC-Pre: 2.81 L
Post FEV1/FVC ratio: 76 %
Post FEV6/FVC ratio: 100 %
Pre FEV1/FVC ratio: 73 %
Pre FEV6/FVC Ratio: 100 %
RV % pred: 78 %
RV: 1.81 L
TLC % pred: 68 %
TLC: 4.54 L

## 2021-02-11 NOTE — Telephone Encounter (Signed)
I'm ok with it as long as they think it will do just as good a job addressing the question of whether his clavicle impinges on his trachea when neck is flexed.

## 2021-02-11 NOTE — Patient Instructions (Signed)
Full PFT performed today. °

## 2021-02-11 NOTE — Progress Notes (Signed)
Full PFT performed today. °

## 2021-02-11 NOTE — Telephone Encounter (Signed)
NM please advise if ok to order the xray that the radiologist recs instead of the CT neck.  I have placed the order but wanted to check before it was sent.  thanks

## 2021-02-14 ENCOUNTER — Other Ambulatory Visit: Payer: HMO

## 2021-02-14 ENCOUNTER — Ambulatory Visit
Admission: RE | Admit: 2021-02-14 | Discharge: 2021-02-14 | Disposition: A | Discharge status: Home / Self Care | Payer: HMO | Source: Ambulatory Visit | Attending: Student | Admitting: Student

## 2021-02-14 ENCOUNTER — Inpatient Hospital Stay: Admission: RE | Admit: 2021-02-14 | Payer: HMO | Source: Ambulatory Visit

## 2021-02-14 DIAGNOSIS — R053 Chronic cough: Secondary | ICD-10-CM

## 2021-02-14 DIAGNOSIS — I700 Atherosclerosis of aorta: Secondary | ICD-10-CM | POA: Diagnosis not present

## 2021-02-14 DIAGNOSIS — J849 Interstitial pulmonary disease, unspecified: Secondary | ICD-10-CM

## 2021-02-14 DIAGNOSIS — R059 Cough, unspecified: Secondary | ICD-10-CM | POA: Diagnosis not present

## 2021-02-14 DIAGNOSIS — Z981 Arthrodesis status: Secondary | ICD-10-CM | POA: Diagnosis not present

## 2021-02-23 DIAGNOSIS — E118 Type 2 diabetes mellitus with unspecified complications: Secondary | ICD-10-CM | POA: Diagnosis not present

## 2021-02-23 DIAGNOSIS — E785 Hyperlipidemia, unspecified: Secondary | ICD-10-CM | POA: Diagnosis not present

## 2021-02-23 DIAGNOSIS — M170 Bilateral primary osteoarthritis of knee: Secondary | ICD-10-CM | POA: Diagnosis not present

## 2021-02-23 DIAGNOSIS — Z23 Encounter for immunization: Secondary | ICD-10-CM | POA: Diagnosis not present

## 2021-02-23 DIAGNOSIS — J45909 Unspecified asthma, uncomplicated: Secondary | ICD-10-CM | POA: Diagnosis not present

## 2021-02-23 DIAGNOSIS — G8929 Other chronic pain: Secondary | ICD-10-CM | POA: Diagnosis not present

## 2021-02-23 DIAGNOSIS — R413 Other amnesia: Secondary | ICD-10-CM | POA: Diagnosis not present

## 2021-02-23 DIAGNOSIS — N182 Chronic kidney disease, stage 2 (mild): Secondary | ICD-10-CM | POA: Diagnosis not present

## 2021-02-23 DIAGNOSIS — R2681 Unsteadiness on feet: Secondary | ICD-10-CM | POA: Diagnosis not present

## 2021-02-23 DIAGNOSIS — Z7189 Other specified counseling: Secondary | ICD-10-CM | POA: Diagnosis not present

## 2021-02-23 DIAGNOSIS — I129 Hypertensive chronic kidney disease with stage 1 through stage 4 chronic kidney disease, or unspecified chronic kidney disease: Secondary | ICD-10-CM | POA: Diagnosis not present

## 2021-02-23 DIAGNOSIS — F39 Unspecified mood [affective] disorder: Secondary | ICD-10-CM | POA: Diagnosis not present

## 2021-02-23 DIAGNOSIS — R0902 Hypoxemia: Secondary | ICD-10-CM | POA: Diagnosis not present

## 2021-02-25 ENCOUNTER — Ambulatory Visit (INDEPENDENT_AMBULATORY_CARE_PROVIDER_SITE_OTHER): Payer: HMO | Admitting: Psychology

## 2021-02-25 DIAGNOSIS — F331 Major depressive disorder, recurrent, moderate: Secondary | ICD-10-CM

## 2021-03-09 ENCOUNTER — Ambulatory Visit: Payer: HMO | Admitting: Neurology

## 2021-03-10 ENCOUNTER — Ambulatory Visit (INDEPENDENT_AMBULATORY_CARE_PROVIDER_SITE_OTHER): Payer: HMO | Admitting: Psychology

## 2021-03-10 DIAGNOSIS — F331 Major depressive disorder, recurrent, moderate: Secondary | ICD-10-CM | POA: Diagnosis not present

## 2021-03-22 ENCOUNTER — Other Ambulatory Visit: Payer: Self-pay

## 2021-03-22 ENCOUNTER — Ambulatory Visit (HOSPITAL_COMMUNITY)
Admission: RE | Admit: 2021-03-22 | Discharge: 2021-03-22 | Disposition: A | Discharge status: Home / Self Care | Payer: HMO | Source: Ambulatory Visit | Attending: Student | Admitting: Student

## 2021-03-22 ENCOUNTER — Telehealth: Payer: Self-pay | Admitting: Student

## 2021-03-22 DIAGNOSIS — I5022 Chronic systolic (congestive) heart failure: Secondary | ICD-10-CM | POA: Insufficient documentation

## 2021-03-22 DIAGNOSIS — R0602 Shortness of breath: Secondary | ICD-10-CM | POA: Diagnosis not present

## 2021-03-22 DIAGNOSIS — N189 Chronic kidney disease, unspecified: Secondary | ICD-10-CM | POA: Insufficient documentation

## 2021-03-22 DIAGNOSIS — E1122 Type 2 diabetes mellitus with diabetic chronic kidney disease: Secondary | ICD-10-CM | POA: Diagnosis not present

## 2021-03-22 DIAGNOSIS — I5023 Acute on chronic systolic (congestive) heart failure: Secondary | ICD-10-CM

## 2021-03-22 LAB — ECHOCARDIOGRAM COMPLETE
Area-P 1/2: 4.06 cm2
S' Lateral: 5.1 cm
Single Plane A4C EF: 35.7 %

## 2021-03-22 NOTE — Telephone Encounter (Signed)
I called the patient and was unable to leave a message for the patient .

## 2021-03-22 NOTE — Telephone Encounter (Signed)
Attempted to call the pt but unable to reach them x 2

## 2021-03-22 NOTE — Progress Notes (Signed)
  Echocardiogram 2D Echocardiogram has been performed.  Donald [name] 03/22/2021, 1:56 PM

## 2021-03-22 NOTE — Telephone Encounter (Signed)
Per pt spouse Diane, pt's pulse ox reads 85% on room air(resting) when he uses oxygen (resting) 95%+. Wanting to let NM know o2 is low. Pt does not have portable units to take with somewhere if this is needed all the time. Please advise. [phone] or [phone])

## 2021-03-22 NOTE — Telephone Encounter (Signed)
Called and spoke to pt's wife. Per pt's spouse the pt has had borderline spo2 on RA at rest (88-92%) but when active his spo2 is in the mid 90s or higher. Pt's spouse states the last few days the pt's spo2 has been mid 77s at rest (still above 70 when active). Pt has been wearing 2lpm while at rest and spo2 will rise to mid 90s. Pt denies SOB, CP/tightness, dizziness, f/c/s, wheezing. Per spouse the pt has intermittent lower extremity edema but this is baseline for pt. The pt also sleeps with a wedge to sleep at incline due to cough. Pts chronic cough is also unchanged. Pt has echo scheduled for today 03/22/21 at 1300. Pt has OV with Dr. Verlee Monte in 06/01/21 for scheduled 4 month ROV. Pt is aware to maintain spo2 above 90% and to go to ED if any new or worsening s/s. Dr. Verlee Monte is unavailable, will send to provider on call to see if any recs are needed at this time due to hypoxia or just continue current therapy.      Dr. Chase Caller, please advise. Thanks.

## 2021-03-22 NOTE — Telephone Encounter (Signed)
The echo report is still pending.  So I have not been able to visualize it.  If the hypoxemia is worse they should go to the ER.  Otherwise if they can wait they should be seen by nurse practitioner in our office this week

## 2021-03-23 MED ORDER — FUROSEMIDE 40 MG PO TABS
40.0000 mg | ORAL_TABLET | Freq: Every day | ORAL | 0 refills | Status: DC
Start: 2021-03-23 — End: 2021-05-10

## 2021-03-23 NOTE — Telephone Encounter (Signed)
Called and spoke with patient. He was returning Leigh's call from last night. I advised him that at the time the message was sent to MR yesterday, the echo report was not available. It is now available and he is aware that I will send a message over to Dr. Verlee Monte this morning.   I asked him if he was still feeling SOB. He stated that he feels fine and is not in any distress. He has a pulse ox at home and will occasionally check his O2. It will sometimes drop down into the mid 80s at rest.   He does have O2 at home since he has 2L of O2 bled into his cpap that was ordered by Dr. Brett Fairy at [facility]. When I asked if he was already established with a DME company, he stated that he received his cpap from [facility] and he bought his O2 Therapist, music. He has not received any supplies from [facility].   I attempted to get him scheduled for an OV but he wanted Dr. Glenetta Hew recommendations.

## 2021-03-23 NOTE — Telephone Encounter (Signed)
I have called the pts wife and scheduled follow up for pt next wed  11/23 at 12 with TP.    Pts wife stated that the pt was seen by cardiology--Dr. Sallyanne Kuster in the past and would like to be referred to him again.

## 2021-03-23 NOTE — Telephone Encounter (Signed)
Called and discussed with his wife. I recommended that they go to ED  for likely admission for IV diuresis for acute on chronic systolic heart failure. They declined but are aware of the risk of deterioration as we work this up while he remains outside of the hospital. I have called in lasix 40 mg daily. He needs BMP, BNP, CXR at clinic visit as soon as we can get one (ideally within a week or so). I'm fine with him seeing any provider - APP or MD. I also referred him to cardiology.  [place]

## 2021-03-27 ENCOUNTER — Encounter (HOSPITAL_COMMUNITY): Payer: Self-pay | Admitting: Emergency Medicine

## 2021-03-27 ENCOUNTER — Emergency Department (HOSPITAL_COMMUNITY): Payer: HMO

## 2021-03-27 ENCOUNTER — Other Ambulatory Visit: Payer: Self-pay

## 2021-03-27 ENCOUNTER — Emergency Department (HOSPITAL_COMMUNITY)
Admission: EM | Admit: 2021-03-27 | Discharge: 2021-03-28 | Disposition: A | Discharge status: Home / Self Care | Payer: HMO | Attending: Emergency Medicine | Admitting: Emergency Medicine

## 2021-03-27 DIAGNOSIS — R079 Chest pain, unspecified: Secondary | ICD-10-CM

## 2021-03-27 DIAGNOSIS — R0600 Dyspnea, unspecified: Secondary | ICD-10-CM | POA: Diagnosis not present

## 2021-03-27 DIAGNOSIS — Z794 Long term (current) use of insulin: Secondary | ICD-10-CM | POA: Insufficient documentation

## 2021-03-27 DIAGNOSIS — I129 Hypertensive chronic kidney disease with stage 1 through stage 4 chronic kidney disease, or unspecified chronic kidney disease: Secondary | ICD-10-CM | POA: Diagnosis not present

## 2021-03-27 DIAGNOSIS — Z7982 Long term (current) use of aspirin: Secondary | ICD-10-CM | POA: Insufficient documentation

## 2021-03-27 DIAGNOSIS — R002 Palpitations: Secondary | ICD-10-CM | POA: Diagnosis not present

## 2021-03-27 DIAGNOSIS — E1122 Type 2 diabetes mellitus with diabetic chronic kidney disease: Secondary | ICD-10-CM | POA: Diagnosis not present

## 2021-03-27 DIAGNOSIS — J45909 Unspecified asthma, uncomplicated: Secondary | ICD-10-CM | POA: Insufficient documentation

## 2021-03-27 DIAGNOSIS — R2242 Localized swelling, mass and lump, left lower limb: Secondary | ICD-10-CM | POA: Diagnosis not present

## 2021-03-27 DIAGNOSIS — R0602 Shortness of breath: Secondary | ICD-10-CM | POA: Diagnosis not present

## 2021-03-27 DIAGNOSIS — Z7984 Long term (current) use of oral hypoglycemic drugs: Secondary | ICD-10-CM | POA: Diagnosis not present

## 2021-03-27 DIAGNOSIS — N183 Chronic kidney disease, stage 3 unspecified: Secondary | ICD-10-CM | POA: Insufficient documentation

## 2021-03-27 DIAGNOSIS — Z79899 Other long term (current) drug therapy: Secondary | ICD-10-CM | POA: Diagnosis not present

## 2021-03-27 DIAGNOSIS — R0789 Other chest pain: Secondary | ICD-10-CM | POA: Insufficient documentation

## 2021-03-27 DIAGNOSIS — R059 Cough, unspecified: Secondary | ICD-10-CM | POA: Diagnosis not present

## 2021-03-27 LAB — CBC
HCT: 42.4 % (ref 39.0–52.0)
Hemoglobin: 13.1 g/dL (ref 13.0–17.0)
MCH: 24.8 pg — ABNORMAL LOW (ref 26.0–34.0)
MCHC: 30.9 g/dL (ref 30.0–36.0)
MCV: 80.3 fL (ref 80.0–100.0)
Platelets: 290 10*3/uL (ref 150–400)
RBC: 5.28 MIL/uL (ref 4.22–5.81)
RDW: 17.2 % — ABNORMAL HIGH (ref 11.5–15.5)
WBC: 10.5 10*3/uL (ref 4.0–10.5)
nRBC: 0 % (ref 0.0–0.2)

## 2021-03-27 MED ORDER — ASPIRIN 81 MG PO CHEW
324.0000 mg | CHEWABLE_TABLET | Freq: Once | ORAL | Status: AC
  Administered 2021-03-27: 324 mg via ORAL
  Filled 2021-03-27: qty 4

## 2021-03-27 NOTE — ED Provider Notes (Signed)
[name] Provider Note   CSN: [id number] Arrival date & time: 03/27/21  2140     History Chief Complaint  Patient presents with   Chest Pain    [name] is a 70 y.o. male with a hx of hypertension, hyperlipidemia, asthma, DM, OSA on CPAP and recent diagnosis of heart failure who presents to the ED with complaints of chest pain that began @ 1300. Patient reports pain is an aching pain/tightness to the left chest, non-radiating, having associated palpitations with this. Discomfort lasts a few minutes to 30 minutes at a time, seems to occur more when he is at rest. No other specific triggers or alleviating/aggravating factors. Has been having dyspnea with activity for a couple of months now with persistent cough and some LE swelling (waxing/waning) which prompted his pulmonologist to refer him for an echocardiogram which showed findings of heart failure. He was placed on 40 mg of Lasix daily which he started 3 days prior, no acute change in his dyspnea or leg swelling over the past few days- specifically no change today with chest pain. He denies fever, nausea, vomiting, diaphoresis, syncope, or leg pain.  HPI     Past Medical History:  Diagnosis Date   Allergic rhinitis, seasonal    Asthma    Bronchitis    Chronic kidney disease, stage I    collar bone    dislocation left side 05/2014   Degenerative joint disease of knee    bilateral   Diabetes mellitus    GERD (gastroesophageal reflux disease)    Heart murmur    history of   History of acute prostatitis    History of hematuria    Hyperlipidemia    Hypertension    IgA nephropathy    OSA on CPAP    Pneumonia    Sinusitis     Patient Active Problem List   Diagnosis Date Noted   UTI (urinary tract infection) 12/25/2018   Recurrent UTI 12/25/2018   CKD (chronic kidney disease), stage III ([place]) 10/03/2018   Dyspnea 07/05/2014   Chronic cough 06/11/2014   Chronic sinusitis  with recurrent bronchitis 06/11/2014   Hypoxemia requiring supplemental oxygen 06/04/2014   OSA on CPAP 06/04/2014   Pneumonitis 06/04/2014   Hypoxemia 06/13/2013   Pneumonia, interstitial ([place]) 06/13/2013   OSA (obstructive sleep apnea) 11/14/2011   Nocturnal hypoxemia 08/18/2011   SOB (shortness of breath) 07/06/2011   DM2 (diabetes mellitus, type 2) ([place]) 07/06/2011   HTN (hypertension) 07/06/2011   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    Past Surgical History:  Procedure Laterality Date   BIOPSY  05/17/2020   Procedure: BIOPSY;  Surgeon: Clarene Essex, MD;  Location: [facility];  Service: Endoscopy;;   CARDIAC CATHETERIZATION  09/29/2011   normal   CERVICAL SPINE SURGERY     COLONOSCOPY WITH PROPOFOL N/A 05/17/2020   Procedure: COLONOSCOPY WITH PROPOFOL;  Surgeon: Clarene Essex, MD;  Location: [facility];  Service: Endoscopy;  Laterality: N/A;   deviated septum  1930s   ESOPHAGOGASTRODUODENOSCOPY (EGD) WITH PROPOFOL N/A 05/17/2020   Procedure: ESOPHAGOGASTRODUODENOSCOPY (EGD) WITH PROPOFOL;  Surgeon: Clarene Essex, MD;  Location: [facility];  Service: Endoscopy;  Laterality: N/A;   HEMOSTASIS CLIP PLACEMENT  05/17/2020   Procedure: HEMOSTASIS CLIP PLACEMENT;  Surgeon: Clarene Essex, MD;  Location: [facility];  Service: Endoscopy;;   HERNIA REPAIR     LEFT HEART CATHETERIZATION WITH CORONARY ANGIOGRAM N/A 09/29/2011   Procedure: LEFT HEART CATHETERIZATION WITH CORONARY ANGIOGRAM;  Surgeon: Sanda Klein, MD;  Location[facility] CATH LAB;  Service: Cardiovascular;  Laterality: N/A;   NM MYOVIEW LTD  07/08/2011   mild LV dilatation,mild septal hypokinesia   POLYPECTOMY  05/17/2020   Procedure: POLYPECTOMY;  Surgeon: Clarene Essex, MD;  Location: [facility];  Service: Endoscopy;;   TONSILLECTOMY     US ECHOCARDIOGRAPHY  09/07/2011   EF 35-45%,mod.ant hypokinesis,boderline LA & RA enlargement,trace MR,TR & PI       Family History  Problem Relation Age of Onset   Coronary artery disease  Father    Diabetes type II Father    Heart attack Father     Social History   Tobacco Use   Smoking status: Never   Smokeless tobacco: Never   Tobacco comments:    Second hand smoke for 26 years per pt  Vaping Use   Vaping Use: Never used  Substance Use Topics   Alcohol use: No    Alcohol/week: 0.0 standard drinks   Drug use: No    Home Medications Prior to Admission medications   Medication Sig Start Date End Date Taking? Authorizing Provider  albuterol (PROVENTIL HFA;VENTOLIN HFA) 108 (90 BASE) MCG/ACT inhaler Inhale 2 puffs into the lungs every 6 (six) hours as needed for wheezing or shortness of breath. For shortness of breath    [provider]  Ascorbic Acid (VITAMIN C PO) Take 1,000 mg by mouth daily.    [provider]  aspirin EC 81 MG tablet Take 81 mg by mouth every evening.    [provider]  Cyanocobalamin (VITAMIN B12) 500 MCG TABS Take 500 mcg by mouth daily.    [provider]  diltiazem (CARDIZEM CD) 240 MG 24 hr capsule Take 240 mg by mouth daily.    [provider]  DULoxetine HCl 60 MG CSDR Take 60 mg by mouth daily.    [provider]  furosemide (LASIX) 40 MG tablet Take 1 tablet (40 mg total) by mouth daily. 03/23/21   Maryjane Hurter, MD  HYDROcodone-acetaminophen (NORCO/VICODIN) 5-325 MG tablet Take one tablet by mouth every 6 hours for severe pain Dx: R07.51 01/04/21   [provider]  ibuprofen (ADVIL,MOTRIN) 200 MG tablet Take 200 mg by mouth daily as needed for headache. Patient not taking: Reported on 01/27/2021    [provider]  Insulin Degludec (TRESIBA [place]) Inject 80 Units into the skin daily.    [provider]  irbesartan-hydrochlorothiazide (AVALIDE) 300-12.5 MG tablet Take 1 tablet by mouth every evening.     [provider]  memantine (NAMENDA TITRATION PAK) tablet pack 5 mg/day for =1 week; 5 mg twice daily for =1 week; 15 mg/day given in 5 mg and 10  mg separated doses for =1 week; then 10 mg twice daily 09/23/20   Dohmeier, Asencion Partridge, MD  memantine (NAMENDA) 10 MG tablet Take 1 tablet (10 mg total) by mouth 2 (two) times daily. 11/17/20   Dohmeier, Asencion Partridge, MD  metFORMIN (GLUCOPHAGE) 1000 MG tablet Take 1 tablet (1,000 mg total) by mouth 2 (two) times daily with a meal. Restart on 8/22 Patient taking differently: Take 1,000 mg by mouth 2 (two) times daily with a meal. 12/27/18   Mikhail, Velta Addison, DO  metoprolol succinate (TOPROL-XL) 50 MG 24 hr tablet Take 50 mg by mouth 2 (two) times daily. Take with or immediately following a meal.    [provider]  Omega-3 Fatty Acids (FISH OIL) 1000 MG CPDR Take 2,000 Units by mouth daily.  [provider]  pantoprazole (PROTONIX) 40 MG tablet Take 40 mg by mouth daily. 11/19/18   [provider]    Allergies    Aricept Reather Littler hcl], Bactrim [sulfamethoxazole-trimethoprim], Ciprofloxacin, and Levofloxacin  Review of Systems   Review of Systems  Constitutional:  Negative for chills and fever.  Respiratory:  Positive for cough and shortness of breath.   Cardiovascular:  Positive for chest pain, palpitations and leg swelling.  Gastrointestinal:  Negative for abdominal pain, nausea and vomiting.  Neurological:  Negative for dizziness, syncope and light-headedness.  All other systems reviewed and are negative.  Physical Exam Updated Vital Signs BP 134/88 (BP Location: Right Arm)   Pulse 72   Temp 97.6 F (36.4 C) (Oral)   Resp 15   Ht 5\' 9"  (1.753 m)   Wt 94 kg   SpO2 94%   BMI 30.60 kg/m   Physical Exam Vitals and nursing note reviewed.  Constitutional:      General: He is not in acute distress.    Appearance: He is well-developed. He is not toxic-appearing.  HENT:     Head: Normocephalic and atraumatic.  Eyes:     General:        Right eye: No discharge.        Left eye: No discharge.     Conjunctiva/sclera: Conjunctivae normal.  Cardiovascular:     Rate  and Rhythm: Normal rate and regular rhythm.     Pulses:          Dorsalis pedis pulses are 2+ on the right side and 2+ on the left side.  Pulmonary:     Effort: Pulmonary effort is normal. No respiratory distress.     Breath sounds: Normal breath sounds. No wheezing, rhonchi or rales.  Abdominal:     General: There is no distension.     Palpations: Abdomen is soft.     Tenderness: There is no abdominal tenderness.  Musculoskeletal:     Cervical back: Neck supple.     Comments: Symmetric trace edema to bilateral lower legs. No calf tenderness.   Skin:    General: Skin is warm and dry.     Findings: No rash.  Neurological:     Mental Status: He is alert.     Comments: Clear speech.   Psychiatric:        Behavior: Behavior normal.    ED Results / Procedures / Treatments   Labs (all labs ordered are listed, but only abnormal results are displayed) Labs Reviewed  BASIC METABOLIC PANEL  CBC  TROPONIN I (HIGH SENSITIVITY)    EKG EKG Interpretation  Date/Time:  Sunday March 27 2021 21:49:01 EST Ventricular Rate:  77 PR Interval:  144 QRS Duration: 98 QT Interval:  378 QTC Calculation: 427 R Axis:   32 Text Interpretation: Sinus rhythm with occasional Premature ventricular complexes Nonspecific ST and T wave abnormality Abnormal ECG No acute changes Confirmed by Addison Lank [phone]) on 03/27/2021 11:45:09 PM  Radiology DG Chest 2 View  Result Date: 03/27/2021 CLINICAL DATA:  Chest pain and shortness of breath EXAM: CHEST - 2 VIEW COMPARISON:  04/02/2019 FINDINGS: The heart size and mediastinal contours are within normal limits. Both lungs are clear. The visualized skeletal structures are unremarkable. IMPRESSION: No active cardiopulmonary disease. Electronically Signed   By: Ulyses Jarred M.D.   On: 03/27/2021 22:26    Procedures Procedures   Medications Ordered in ED Medications - No data to display  ED Course  I have  reviewed the triage vital signs and the  nursing notes.  Pertinent labs & imaging results that were available during my care of the patient were reviewed by me and considered in my medical decision making (see chart for details).    MDM Rules/Calculators/A&P                           Patient presents to the ED with complaints of chest pain.  Nontoxic, vitals WNL.   Additional history obtained:  Additional history obtained from chart review & nursing note review.  Recent echocardiogram 03/22/21- EF 35-40%, LV with global hypokineses, diastolic parameter consistent with grade I diastolic dysfunction.   Patient reports he had a stress test and cardiac catheterization about 10 years ago that looked okay. He is planning to see his prior cardiologist Dr. Sallyanne Kuster in December for his new diagnosis of HF.   EKG: Sinus rhythm with occasional Premature ventricular complexes Nonspecific ST and T wave abnormality Abnormal ECG No acute changes  Lab Tests:  I Ordered, reviewed, and interpreted labs, which included:  CBC: fairly unremarkable.  CMP: mild hypokalemia, BUN is elevated.  Troponin: Mild elevation @ 23 on initial, delta flat BNP: WNL  Imaging Studies ordered:  I ordered imaging studies which included CXR, I independently reviewed, formal radiology impression shows: No active cardiopulmonary disease  ED Course:  Patient with chest pain today.  Moderate heart pathway score, pain is not exertiona, EKG without acute changes, troponins are flat, low suspicion for acute ACS at this time. Low risk wells- doubt PE. CXR w/o pneumothorax or pneumonia. He does have a recent new diagnoses of CHF with EF 35-40%, recently started on lasix, he has minimal LE edema, CXR w/o acute pulmonary edema or pleural effusions, BNP WNL, oxygenating appropriately on RA when awake, does desaturate some with bradypnea when sleeping but is on oxyyen/CPAP at night for his OSA which I suspect is the underlying cause of this. He has close follow up scheduled  with his pulmonologist this week and is scheduled to see cardiology next month, given reassuring ED work-up he appears reasonable for discharge with strict return precautions. Mild hypokalemia on labs today- will give oral potassium and a few days of potassium supplement to go home with until able to have labs rechecked at pulmonology. We discussed results, treatment plan, need for follow-up, and return precautions with the patient and his wife. Provided opportunity for questions, patient and his wife confirmed understanding and are in agreement with plan.    This is a shared visit with supervising physician Dr. Leonette Monarch who has independently evaluated patient & provided guidance in evaluation/management/disposition, in agreement with care   Portions of this note were generated with Dragon dictation software. Dictation errors may occur despite best attempts at proofreading.  Final Clinical Impression(s) / ED Diagnoses Final diagnoses:  Chest pain, unspecified type    Rx / DC Orders ED Discharge Orders          Ordered    potassium chloride SA (KLOR-CON) 20 MEQ tablet  Daily        03/28/21 0355             Amaryllis Dyke, PA-C 03/28/21 0501    Fatima Blank, MD 03/28/21 [phone]

## 2021-03-27 NOTE — ED Triage Notes (Signed)
Patient reports left chest " tightness" this morning with SOB , no emesis or diaphoresis , recently diagnosed with CHF .

## 2021-03-28 LAB — COMPREHENSIVE METABOLIC PANEL
ALT: 37 U/L (ref 0–44)
AST: 28 U/L (ref 15–41)
Albumin: 3.8 g/dL (ref 3.5–5.0)
Alkaline Phosphatase: 42 U/L (ref 38–126)
Anion gap: 15 (ref 5–15)
BUN: 43 mg/dL — ABNORMAL HIGH (ref 8–23)
CO2: 27 mmol/L (ref 22–32)
Calcium: 9.4 mg/dL (ref 8.9–10.3)
Chloride: 99 mmol/L (ref 98–111)
Creatinine, Ser: 1.22 mg/dL (ref 0.61–1.24)
GFR, Estimated: >60 mL/min (ref >60)
Glucose, Bld: 160 mg/dL — ABNORMAL HIGH (ref 70–99)
Potassium: 3.2 mmol/L — ABNORMAL LOW (ref 3.5–5.1)
Sodium: 141 mmol/L (ref 135–145)
Total Bilirubin: 0.6 mg/dL (ref 0.3–1.2)
Total Protein: 7.1 g/dL (ref 6.5–8.1)

## 2021-03-28 LAB — TROPONIN I (HIGH SENSITIVITY)
Troponin I (High Sensitivity): 23 ng/L — ABNORMAL HIGH (ref <18)
Troponin I (High Sensitivity): 23 ng/L — ABNORMAL HIGH (ref <18)

## 2021-03-28 LAB — BRAIN NATRIURETIC PEPTIDE: B Natriuretic Peptide: 9.5 pg/mL (ref 0.0–100.0)

## 2021-03-28 MED ORDER — POTASSIUM CHLORIDE CRYS ER 20 MEQ PO TBCR: 20.0000 meq | EXTENDED_RELEASE_TABLET | Freq: Every day | ORAL | 0 refills | Status: DC

## 2021-03-28 MED ORDER — POTASSIUM CHLORIDE CRYS ER 20 MEQ PO TBCR
40.0000 meq | EXTENDED_RELEASE_TABLET | Freq: Once | ORAL | Status: AC
  Administered 2021-03-28: 40 meq via ORAL
  Filled 2021-03-28: qty 2

## 2021-03-28 NOTE — Discharge Instructions (Signed)
You were seen in the emergency department today for chest pain. Your work-up in the emergency department has been overall reassuring. Your labs have been fairly normal and or similar to previous blood work you have had done- your blood sugar was elevated at 160 and your potassium was mildly low at 3.2- we are sending you home with a few days of a potassium supplement to take until you are able to have this rechecked at your pulmonology office. Your EKG and the enzyme we use to check your heart were overall reassuring.  Your chest x-ray was normal- no fluid in the lungs.   We would like you to follow up closely with your pulmonologist and cardiologist as scheduled. Return to the ER immediately should you experience any new or worsening symptoms including but not limited to new or worsening pain, chest pain with exertion, vomiting, increased shortness of breath, dizziness, lightheadedness, passing out, or any other concerns that you may have.

## 2021-03-30 ENCOUNTER — Other Ambulatory Visit: Payer: Self-pay

## 2021-03-30 ENCOUNTER — Ambulatory Visit: Payer: HMO | Admitting: Adult Health

## 2021-03-30 ENCOUNTER — Encounter: Payer: Self-pay | Admitting: Adult Health

## 2021-03-30 DIAGNOSIS — I5023 Acute on chronic systolic (congestive) heart failure: Secondary | ICD-10-CM

## 2021-03-30 DIAGNOSIS — Z9989 Dependence on other enabling machines and devices: Secondary | ICD-10-CM

## 2021-03-30 DIAGNOSIS — G4733 Obstructive sleep apnea (adult) (pediatric): Secondary | ICD-10-CM | POA: Diagnosis not present

## 2021-03-30 DIAGNOSIS — I5022 Chronic systolic (congestive) heart failure: Secondary | ICD-10-CM | POA: Insufficient documentation

## 2021-03-30 DIAGNOSIS — G4734 Idiopathic sleep related nonobstructive alveolar hypoventilation: Secondary | ICD-10-CM

## 2021-03-30 DIAGNOSIS — I5041 Acute combined systolic (congestive) and diastolic (congestive) heart failure: Secondary | ICD-10-CM

## 2021-03-30 DIAGNOSIS — R0609 Other forms of dyspnea: Secondary | ICD-10-CM | POA: Diagnosis not present

## 2021-03-30 DIAGNOSIS — I509 Heart failure, unspecified: Secondary | ICD-10-CM | POA: Insufficient documentation

## 2021-03-30 LAB — BASIC METABOLIC PANEL
BUN: 55 mg/dL — ABNORMAL HIGH (ref 6–23)
CO2: 32 mEq/L (ref 19–32)
Calcium: 10 mg/dL (ref 8.4–10.5)
Chloride: 95 mEq/L — ABNORMAL LOW (ref 96–112)
Creatinine, Ser: 1.58 mg/dL — ABNORMAL HIGH (ref 0.40–1.50)
GFR: 44.54 mL/min — ABNORMAL LOW (ref >60.00)
Glucose, Bld: 146 mg/dL — ABNORMAL HIGH (ref 70–99)
Potassium: 3.8 mEq/L (ref 3.5–5.1)
Sodium: 139 mEq/L (ref 135–145)

## 2021-03-30 LAB — BRAIN NATRIURETIC PEPTIDE: Pro B Natriuretic peptide (BNP): 7 pg/mL (ref 0.0–100.0)

## 2021-03-30 LAB — TROPONIN I (HIGH SENSITIVITY): High Sens Troponin I: 22 ng/L (ref 2–17)

## 2021-03-30 NOTE — Patient Instructions (Addendum)
Decrease Lasix 20mg  daily. , may take extra 1/2 Lasix if needed for swelling .  Low salt diet.  Continue on CPAP At bedtime  with Oxygen 3l/m  Follow up with Cardiology in 2 weeks as planned and As needed   Follow up with Dr. Verlee Monte in 4-6 weeks and As needed   Please contact office for sooner follow up if symptoms do not improve or worsen or seek emergency care

## 2021-03-30 NOTE — Assessment & Plan Note (Signed)
Continue on CPAP at bedtime managed by neurology

## 2021-03-30 NOTE — Assessment & Plan Note (Signed)
Shortness of breath suspect is related to new onset congestive heart failure.  Would continue on Lasix but may decrease down to 20 mg daily.  High-resolution CT chest showed no evidence of interstitial lung disease. PFT shows some moderate restriction which may be evident with some underlying asthma.  However he has no significant cough or wheezing.  He does have albuterol inhaler to use as needed.  Hold on maintenance inhaler at this time.  We will reevaluate on return visit.  Plan Patient Instructions  Decrease Lasix 20mg  daily. , may take extra 1/2 Lasix if needed for swelling .  Low salt diet.  Continue on CPAP At bedtime  with Oxygen 3l/m  Follow up with Cardiology in 2 weeks as planned and As needed   Follow up with Dr. Verlee Monte in 4-6 weeks and As needed   Please contact office for sooner follow up if symptoms do not improve or worsen or seek emergency care

## 2021-03-30 NOTE — Progress Notes (Signed)
@Patient  ID: Donald [name], male    DOB: 12-25-1950, 70 y.o.   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 However,  pulmonary function test 08/25/2011 was normal   Walking desta test - 185 feet x 3 laps: No desats (at hom ehe has nocturnal and siting desats to 70s but none during exertion per him)  Autoimmune panel negative  CT sinus negative  Cough work up :  He was a Dance movement psychotherapist, worked with Veterinary surgeon. 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 Had frequent dust/particulate exposure without mask back then (last in 1981). Has beagle at home. No pet birds. He has no hot tub. Lived in [place] for some time, [place].    03/30/2021 Follow up : Cough  ,CHF , dyspnea  Patient returns for a 68-month follow-up.  Patient had previously been seen in the past for chronic cough.  He had last been seen in the office in February 2016.  He reestablished in September 2022 for shortness of breath and cough. He was set up for High-resolution CT chest February 14, 2021 showed no findings to suggest interstitial lung disease, mild air trapping.  Incidental findings were atherosclerosis and possible hepatic cyst. 2D echo March 22, 2021 showed EF at 35 to 01%, grade 1 diastolic dysfunction, normal pulmonary artery systolic pressure 2D echo March 2013 showed EF at 50 to 55% and normal diastolic function. Went to ER was  given potassium replacement .  Lab work from the emergency room showed minimally elevated high-sensitivity troponin at 23.  BNP normal.  Recommended to follow up with Cardiology , has ov 04/22/21.  Says overall breathing is doing about the same.  He denies any significant shortness of breath.  Cough is slightly decreased.  Patient has no significant lower extremity swelling.  He remains on Lasix 40 mg daily. Wears CPAP At bedtime with Oxygen 3l/m  . Never goes without it. Followed by Neurology .  Does notice at home that his O2 saturations drop if he is sitting still or recline slightly.  Today in the office O2 saturations dropped with finger pulse oximeter, he was placed on the pulse oximeter forehead probe with no desaturations O2 saturations at rest at 97% on room air.  And no significant desaturations with ambulation ranging 90% on room air walking. PFTs do show moderate restriction and reversibility.  FEV1 75%, ratio 76, FVC 73, positive bronchodilator response, DLCO 80%. He does have an albuterol inhaler.  He has no significant cough or wheezing.        Allergies  Allergen Reactions   Aricept [Donepezil Hcl] Nausea Only   Bactrim [Sulfamethoxazole-Trimethoprim] Hives   Ciprofloxacin     Tendons problem in shoulder   Levofloxacin Other (See Comments)    Muscle tighten up in left and right calf    Immunization History  Administered Date(s) Administered   Influenza Split 02/05/2014   Influenza, Quadrivalent, Recombinant, Inj, Pf 02/23/2021  Pneumococcal Conjugate-13 04/06/2014, 08/29/2017   Pneumococcal Polysaccharide-23 05/08/2006   Pneumococcal-Unspecified 03/08/2014   Tdap 07/10/2008   Zoster, Live 01/30/2014    Past Medical History:  Diagnosis Date   Allergic rhinitis, seasonal    Asthma    Bronchitis    Chronic kidney disease, stage I    collar bone    dislocation left side 05/2014   Degenerative joint disease of knee    bilateral   Diabetes mellitus    GERD  (gastroesophageal reflux disease)    Heart murmur    history of   History of acute prostatitis    History of hematuria    Hyperlipidemia    Hypertension    IgA nephropathy    OSA on CPAP    Pneumonia    Sinusitis     Tobacco History: Social History   Tobacco Use  Smoking Status Never  Smokeless Tobacco Never  Tobacco Comments   Second hand smoke for 26 years per pt   Counseling given: Not Answered Tobacco comments: Second hand smoke for 26 years per pt   Outpatient Medications Prior to Visit  Medication Sig Dispense Refill   Ascorbic Acid (VITAMIN C PO) Take 1,000 mg by mouth daily.     Continuous Blood Gluc Sensor (FREESTYLE LIBRE 14 DAY SENSOR) MISC      Cyanocobalamin (VITAMIN B12) 500 MCG TABS Take 500 mcg by mouth daily.     diltiazem (CARDIZEM CD) 240 MG 24 hr capsule Take 240 mg by mouth daily.     DULoxetine HCl 60 MG CSDR Take 60 mg by mouth daily.     furosemide (LASIX) 40 MG tablet Take 1 tablet (40 mg total) by mouth daily. 30 tablet 0   HYDROcodone-acetaminophen (NORCO/VICODIN) 5-325 MG tablet Take one tablet by mouth every 6 hours for severe pain Dx: R07.51     ibuprofen (ADVIL,MOTRIN) 200 MG tablet Take 200 mg by mouth daily as needed for headache.     Insulin Degludec (TRESIBA [place]) Inject 80 Units into the skin daily.     irbesartan-hydrochlorothiazide (AVALIDE) 300-12.5 MG tablet Take 1 tablet by mouth every evening.      memantine (NAMENDA TITRATION PAK) tablet pack 5 mg/day for =1 week; 5 mg twice daily for =1 week; 15 mg/day given in 5 mg and 10 mg separated doses for =1 week; then 10 mg twice daily 49 tablet 0   memantine (NAMENDA) 10 MG tablet Take 1 tablet (10 mg total) by mouth 2 (two) times daily. 60 tablet 11   metFORMIN (GLUCOPHAGE) 1000 MG tablet Take 1 tablet (1,000 mg total) by mouth 2 (two) times daily with a meal. Restart on 8/22 (Patient taking differently: Take 1,000 mg by mouth 2 (two) times daily with a meal.)     metoprolol succinate  (TOPROL-XL) 50 MG 24 hr tablet Take 50 mg by mouth 2 (two) times daily. Take with or immediately following a meal.     Omega-3 Fatty Acids (FISH OIL) 1000 MG CPDR Take 2,000 Units by mouth daily.     pantoprazole (PROTONIX) 40 MG tablet Take 40 mg by mouth daily.     potassium chloride SA (KLOR-CON) 20 MEQ tablet Take 1 tablet (20 mEq total) by mouth daily. 4 tablet 0   albuterol (PROVENTIL HFA;VENTOLIN HFA) 108 (90 BASE) MCG/ACT inhaler Inhale 2 puffs into the lungs every 6 (six) hours as needed for wheezing or shortness of breath. For shortness of breath (Patient not taking: Reported on 03/30/2021)  aspirin EC 81 MG tablet Take 81 mg by mouth every evening. (Patient not taking: Reported on 03/30/2021)     No facility-administered medications prior to visit.     Review of Systems:   Constitutional:   No  weight loss, night sweats,  Fevers, chills,  +fatigue, or  lassitude.  HEENT:   No headaches,  Difficulty swallowing,  Tooth/dental problems, or  Sore throat,                No sneezing, itching, ear ache, nasal congestion, post nasal drip,   CV:  No chest pain,  Orthopnea, PND, swelling in lower extremities, anasarca, dizziness, palpitations, syncope.   GI  No heartburn, indigestion, abdominal pain, nausea, vomiting, diarrhea, change in bowel habits, loss of appetite, bloody stools.   Resp:   No wheezing.  No chest wall deformity  Skin: no rash or lesions.  GU: no dysuria, change in color of urine, no urgency or frequency.  No flank pain, no hematuria   MS:  No joint pain or swelling.  No decreased range of motion.  No back pain.    Physical Exam  BP 136/68 (BP Location: Left Arm, Patient Position: Sitting, Cuff Size: Normal)   Pulse 80   Temp 97.8 F (36.6 C) (Oral)   Ht 5\' 9"  (1.753 m)   Wt 196 lb 9.6 oz (89.2 kg)   SpO2 96%   BMI 29.03 kg/m   GEN: A/Ox3; pleasant , NAD, well nourished    HEENT:  [place]/AT, NOSE-clear, THROAT-clear, no lesions, no postnasal drip or  exudate noted.   NECK:  Supple w/ fair ROM; no JVD; normal carotid impulses w/o bruits; no thyromegaly or nodules palpated; no lymphadenopathy.    RESP  Clear  P & A; w/o, wheezes/ rales/ or rhonchi. no accessory muscle use, no dullness to percussion  CARD:  RRR, no m/r/g, no peripheral edema, pulses intact, no cyanosis or clubbing.  GI:   Soft & nt; nml bowel sounds; no organomegaly or masses detected.   Musco: Warm bil, no deformities or joint swelling noted.   Neuro: alert, no focal deficits noted.    Skin: Warm, no lesions or rashes    Lab Results:  CBC   BMET   BNP   Imaging: DG Chest 2 View  Result Date: 03/27/2021 CLINICAL DATA:  Chest pain and shortness of breath EXAM: CHEST - 2 VIEW COMPARISON:  04/02/2019 FINDINGS: The heart size and mediastinal contours are within normal limits. Both lungs are clear. The visualized skeletal structures are unremarkable. IMPRESSION: No active cardiopulmonary disease. Electronically Signed   By: Ulyses Jarred M.D.   On: 03/27/2021 22:26   ECHOCARDIOGRAM COMPLETE  Result Date: 03/22/2021    ECHOCARDIOGRAM REPORT   Patient Name:   TYUS KALLAM Date of Exam: 03/22/2021 Medical Rec #:  [id number]         Height:       69.0 in Accession #:    [id number]        Weight:       209.4 lb Date of Birth:  06/07/1950        BSA:          2.107 m Patient Age:    70 years          BP:           134/76 mmHg Patient Gender: M                 HR:  77 bpm. Exam Location:  Outpatient Procedure: 2D Echo Indications:    congestive heart failure  History:        Patient has prior history of Echocardiogram examinations, most                 recent 09/06/2011. Chronic kidney disease,                 Signs/Symptoms:Shortness of Breath; Risk Factors:Diabetes and                 Sleep Apnea.  Sonographer:    [place] Referring Phys: [id number] Hortencia Conradi MEIER IMPRESSIONS  1. Left ventricular ejection fraction, by estimation, is 35 to 40%.  The left ventricle has moderately decreased function. The left ventricle demonstrates global hypokinesis. Left ventricular diastolic parameters are consistent with Grade I diastolic dysfunction (impaired relaxation).  2. Right ventricular systolic function is low normal. The right ventricular size is normal. There is normal pulmonary artery systolic pressure. The estimated right ventricular systolic pressure is 99.8 mmHg.  3. The mitral valve is grossly normal. Trivial mitral valve regurgitation.  4. The aortic valve is tricuspid. Aortic valve regurgitation is not visualized.  5. The inferior vena cava is normal in size with greater than 50% respiratory variability, suggesting right atrial pressure of 3 mmHg. Comparison(s): Prior images unable to be directly viewed, comparison made by report only. Changes from prior study are noted. 09/06/2011: LVEF 35-45%. FINDINGS  Left Ventricle: Left ventricular ejection fraction, by estimation, is 35 to 40%. The left ventricle has moderately decreased function. The left ventricle demonstrates global hypokinesis. The left ventricular internal cavity size was normal in size. There is no left ventricular hypertrophy. Left ventricular diastolic parameters are consistent with Grade I diastolic dysfunction (impaired relaxation). Indeterminate filling pressures. Right Ventricle: The right ventricular size is normal. No increase in right ventricular wall thickness. Right ventricular systolic function is low normal. There is normal pulmonary artery systolic pressure. The tricuspid regurgitant velocity is 2.54 m/s,  and with an assumed right atrial pressure of 3 mmHg, the estimated right ventricular systolic pressure is 33.8 mmHg. Left Atrium: Left atrial size was normal in size. Right Atrium: Right atrial size was normal in size. Pericardium: There is no evidence of pericardial effusion. Mitral Valve: The mitral valve is grossly normal. Trivial mitral valve regurgitation. Tricuspid Valve:  The tricuspid valve is grossly normal. Tricuspid valve regurgitation is trivial. Aortic Valve: The aortic valve is tricuspid. Aortic valve regurgitation is not visualized. Pulmonic Valve: The pulmonic valve was normal in structure. Pulmonic valve regurgitation is not visualized. Aorta: The aortic root and ascending aorta are structurally normal, with no evidence of dilitation. Venous: The inferior vena cava is normal in size with greater than 50% respiratory variability, suggesting right atrial pressure of 3 mmHg. IAS/Shunts: No atrial level shunt detected by color flow Doppler.  LEFT VENTRICLE PLAX 2D LVIDd:         6.00 cm      Diastology LVIDs:         5.10 cm      LV e' medial:    4.57 cm/s LV PW:         1.20 cm      LV E/e' medial:  16.3 LV IVS:        0.80 cm      LV e' lateral:   8.16 cm/s LVOT diam:     2.40 cm      LV E/e' lateral: 9.1 LV  SV:         73 LV SV Index:   35 LVOT Area:     4.52 cm  LV Volumes (MOD) LV vol d, MOD A4C: 111.0 ml LV vol s, MOD A4C: 71.4 ml LV SV MOD A4C:     111.0 ml RIGHT VENTRICLE             IVC RV S prime:     15.60 cm/s  IVC diam: 1.80 cm TAPSE (M-mode): 2.8 cm LEFT ATRIUM             Index        RIGHT ATRIUM           Index LA diam:        4.50 cm 2.14 cm/m   RA Area:     18.50 cm LA Vol (A2C):   69.9 ml 33.18 ml/m  RA Volume:   45.40 ml  21.55 ml/m LA Vol (A4C):   61.5 ml 29.19 ml/m LA Biplane Vol: 68.4 ml 32.47 ml/m  AORTIC VALVE LVOT Vmax:   82.70 cm/s LVOT Vmean:  55.300 cm/s LVOT VTI:    0.162 m  AORTA Ao Root diam: 3.40 cm Ao Asc diam:  3.10 cm MITRAL VALVE                TRICUSPID VALVE MV Area (PHT): 4.06 cm     TR Peak grad:   25.8 mmHg MV Decel Time: 187 msec     TR Vmax:        254.00 cm/s MV E velocity: 74.60 cm/s MV A velocity: 100.00 cm/s  SHUNTS MV E/A ratio:  0.75         Systemic VTI:  0.16 m                             Systemic Diam: 2.40 cm Lyman Bishop MD Electronically signed by Lyman Bishop MD Signature Date/Time: 03/22/2021/11:04:25 PM     Final       PFT Results Latest Ref Rng & Units 02/11/2021 06/17/2014  FVC-Pre L 2.81 3.30  FVC-Predicted Pre % 67 75  FVC-Post L 3.04 3.49  FVC-Predicted Post % 73 79  Pre FEV1/FVC % % 73 73  Post FEV1/FCV % % 76 77  FEV1-Pre L 2.05 2.42  FEV1-Predicted Pre % 66 73  FEV1-Post L 2.32 2.69  DLCO uncorrected ml/min/mmHg 19.80 21.18  DLCO UNC% % 80 71  DLCO corrected ml/min/mmHg 19.80 -  DLCO COR %Predicted % 80 -  DLVA Predicted % 100 94  TLC L 4.54 5.18  TLC % Predicted % 68 78  RV % Predicted % 78 83    No results found for: NITRICOXIDE      Assessment & Plan:   CHF (congestive heart failure) (HCC) New onset congestive heart failure with notable decreased EF on echo at 35 to 40% along with grade 1 diastolic dysfunction.  Questionable etiology.  Patient has no significant volume overload on exam.  Appears euvolemic.  He has been on Lasix 40 mg for the last week.  Will decrease down to 20 mg.  He did have some mild hypokalemia and was replaced in the emergency room this week.  He has a referral with cardiology in 2 weeks Patient advised if symptoms worsen he develops chest pain, worsening dyspnea or and or leg swelling he is to seek emergency room care.  Otherwise continue on current regimen.  Plan  Patient Instructions  Decrease Lasix 20mg  daily. , may take extra 1/2 Lasix if needed for swelling .  Low salt diet.  Continue on CPAP At bedtime  with Oxygen 3l/m  Follow up with Cardiology in 2 weeks as planned and As needed   Follow up with Dr. Verlee Monte in 4-6 weeks and As needed   Please contact office for sooner follow up if symptoms do not improve or worsen or seek emergency care        OSA on CPAP Continue on CPAP at bedtime managed by neurology  Nocturnal hypoxemia Continue on oxygen with CPAP.  No hypoxemia/desaturations at rest or with exertion.  Dyspnea Shortness of breath suspect is related to new onset congestive heart failure.  Would continue on Lasix but may  decrease down to 20 mg daily.  High-resolution CT chest showed no evidence of interstitial lung disease. PFT shows some moderate restriction which may be evident with some underlying asthma.  However he has no significant cough or wheezing.  He does have albuterol inhaler to use as needed.  Hold on maintenance inhaler at this time.  We will reevaluate on return visit.  Plan Patient Instructions  Decrease Lasix 20mg  daily. , may take extra 1/2 Lasix if needed for swelling .  Low salt diet.  Continue on CPAP At bedtime  with Oxygen 3l/m  Follow up with Cardiology in 2 weeks as planned and As needed   Follow up with Dr. Verlee Monte in 4-6 weeks and As needed   Please contact office for sooner follow up if symptoms do not improve or worsen or seek emergency care         I spent   41 minutes dedicated to the care of this patient on the date of this encounter to include pre-visit review of records, face-to-face time with the patient discussing conditions above, post visit ordering of testing, clinical documentation with the electronic health record, making appropriate referrals as documented, and communicating necessary findings to members of the patients care team.   Rexene Edison, NP 03/30/2021

## 2021-03-30 NOTE — Assessment & Plan Note (Signed)
New onset congestive heart failure with notable decreased EF on echo at 35 to 40% along with grade 1 diastolic dysfunction.  Questionable etiology.  Patient has no significant volume overload on exam.  Appears euvolemic.  He has been on Lasix 40 mg for the last week.  Will decrease down to 20 mg.  He did have some mild hypokalemia and was replaced in the emergency room this week.  He has a referral with cardiology in 2 weeks Patient advised if symptoms worsen he develops chest pain, worsening dyspnea or and or leg swelling he is to seek emergency room care.  Otherwise continue on current regimen.  Plan  Patient Instructions  Decrease Lasix 20mg  daily. , may take extra 1/2 Lasix if needed for swelling .  Low salt diet.  Continue on CPAP At bedtime  with Oxygen 3l/m  Follow up with Cardiology in 2 weeks as planned and As needed   Follow up with Dr. Verlee Monte in 4-6 weeks and As needed   Please contact office for sooner follow up if symptoms do not improve or worsen or seek emergency care

## 2021-03-30 NOTE — Assessment & Plan Note (Signed)
Continue on oxygen with CPAP.  No hypoxemia/desaturations at rest or with exertion.

## 2021-03-30 NOTE — Addendum Note (Signed)
Addended by: Vivia Ewing on: 03/30/2021 11:46 AM   Modules accepted: Orders

## 2021-04-01 ENCOUNTER — Telehealth: Payer: Self-pay | Admitting: Student

## 2021-04-01 NOTE — Telephone Encounter (Signed)
0 Result Notes Component Ref Range & Units 2 d ago   High Sens Troponin I 2 - 17 ng/L 22 High Panic    Resulting Agency  [facility]       Narrative Performed by: Zannie Kehr Critical result called to Dr. Lamonte Sakai on 03/30/2021 12:53 PM by Deborra Medina. Results were read back to caller.         Routing to Dr. Verlee Monte as an Juluis Rainier.

## 2021-04-05 DIAGNOSIS — Z794 Long term (current) use of insulin: Secondary | ICD-10-CM | POA: Diagnosis not present

## 2021-04-05 DIAGNOSIS — E785 Hyperlipidemia, unspecified: Secondary | ICD-10-CM | POA: Diagnosis not present

## 2021-04-05 DIAGNOSIS — E118 Type 2 diabetes mellitus with unspecified complications: Secondary | ICD-10-CM | POA: Diagnosis not present

## 2021-04-05 DIAGNOSIS — I129 Hypertensive chronic kidney disease with stage 1 through stage 4 chronic kidney disease, or unspecified chronic kidney disease: Secondary | ICD-10-CM | POA: Diagnosis not present

## 2021-04-05 DIAGNOSIS — N182 Chronic kidney disease, stage 2 (mild): Secondary | ICD-10-CM | POA: Diagnosis not present

## 2021-04-18 DIAGNOSIS — R3914 Feeling of incomplete bladder emptying: Secondary | ICD-10-CM | POA: Diagnosis not present

## 2021-04-18 DIAGNOSIS — R8279 Other abnormal findings on microbiological examination of urine: Secondary | ICD-10-CM | POA: Diagnosis not present

## 2021-04-18 DIAGNOSIS — N401 Enlarged prostate with lower urinary tract symptoms: Secondary | ICD-10-CM | POA: Diagnosis not present

## 2021-04-22 ENCOUNTER — Other Ambulatory Visit: Payer: Self-pay

## 2021-04-22 ENCOUNTER — Encounter: Payer: Self-pay | Admitting: Cardiovascular Disease

## 2021-04-22 ENCOUNTER — Ambulatory Visit: Payer: HMO | Admitting: Cardiovascular Disease

## 2021-04-22 VITALS — BP 110/72 | HR 78 | Ht 69.0 in | Wt 198.0 lb

## 2021-04-22 DIAGNOSIS — G4733 Obstructive sleep apnea (adult) (pediatric): Secondary | ICD-10-CM | POA: Diagnosis not present

## 2021-04-22 DIAGNOSIS — E119 Type 2 diabetes mellitus without complications: Secondary | ICD-10-CM

## 2021-04-22 DIAGNOSIS — I5022 Chronic systolic (congestive) heart failure: Secondary | ICD-10-CM | POA: Diagnosis not present

## 2021-04-22 DIAGNOSIS — Z794 Long term (current) use of insulin: Secondary | ICD-10-CM | POA: Diagnosis not present

## 2021-04-22 DIAGNOSIS — I10 Essential (primary) hypertension: Secondary | ICD-10-CM

## 2021-04-22 DIAGNOSIS — E782 Mixed hyperlipidemia: Secondary | ICD-10-CM

## 2021-04-22 DIAGNOSIS — R4189 Other symptoms and signs involving cognitive functions and awareness: Secondary | ICD-10-CM

## 2021-04-22 DIAGNOSIS — R072 Precordial pain: Secondary | ICD-10-CM | POA: Diagnosis not present

## 2021-04-22 MED ORDER — ENTRESTO 49-51 MG PO TABS: 1.0000 | ORAL_TABLET | Freq: Two times a day (BID) | ORAL | 11 refills | Status: DC

## 2021-04-22 MED ORDER — SPIRONOLACTONE 25 MG PO TABS: 25.0000 mg | ORAL_TABLET | Freq: Every day | ORAL | 3 refills | Status: DC

## 2021-04-22 NOTE — Progress Notes (Signed)
Cardiology Office Note:    Date:  04/26/2021   ID:  Donald [name], DOB 04-25-1951, MRN [id number]  PCP:  Prince Solian, MD   [facility] Providers Cardiologist:  Sanda Klein, MD     Referring MD: Maryjane Hurter, MD   No chief complaint on file. Donald [name] is a 70 y.o. male who is being seen today for the evaluation of CHF at the request of Maryjane Hurter, MD.   History of Present Illness:    Donald [name] is a 69 y.o. male with a hx of nonischemic cardiomyopathy dating back to at least 2013 when he had coronary angiography that showed no evidence of significant obstructive disease.  Additional medical problems include treated hypertension, type 2 diabetes mellitus, hypercholesterolemia OSA on CPAP.  This is his first evaluation in the cardiology office in about 10 years.  A recent chest CT for possible cessation of lung disease showed no evidence of this, but showed evidence of aortic atherosclerosis and three-vessel coronary atherosclerotic calcifications.  In 2013, nuclear stress that showed a left ventricular ejection fraction of 50%, with a normal perfusion pattern and mild left ventricular dilatation.  His echocardiogram also showed an EF of 50-55% with mild diffuse left ventricular hypokinesis and no significant valvular abnormalities.  In 2016, his pulmonary specialist Dr. Chase Caller ordered a cardiopulmonary excise stress test that showed mild-mod reduced functional capacity without any clear evidence of cardiovascular limitations.  Chronotropic response was normal.  The peak VO2 was mildly reduced at 23.5 mL/kilogram/minute (76% of predicted).  VE/VCO2 slope was normal.  Recent echocardiogram performed 03/22/2021 shows further reduction in LVEF which is now down to 35-40%.  Diastolic parameters suggest normal filling pressures.  The systolic PA pressure is normal at 29 mmHg.Once again there are no significant valvular abnormalities.  He had some  worsening shortness of breath with activity and mild ankle edema which improved after he was prescribed furosemide.  He was seen in the emergency room on 03/27/2021.  At that time he reportedly has some complaints of chest discomfort, although he does not clearly recall his symptoms associated with those events.  His ECG shows some PVCs but no ischemic repolarization abnormalities and his labs were normal (troponin was nominally elevated at 23, but flat, BNP was normal at 9.5). Overall he has NYHA functional class II exertional dyspnea but does not have orthopnea, PND or lower extremity edema.  He denies chest pain.  He has not had palpitations, dizziness or syncope.  Medication or any focal neurological complaints.  He has been going to the gym and exercising with a trainer, but often has to tell them to slow down because he feels exhausted.  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  He also has some early cognitive deficits especially short-term memory problems and is due to see Dr. Brett Fairy in early January he monitors his CPAP for OSA).  He is taking memantine with some benefit.  He uses CPAP regularly and denies daytime hypersomnolence.  His blood pressure is excellent 110/70 usually, and his medications include metoprolol succinate and irbesartan, but also diltiazem and hydrochlorothiazide.  He has recently started on furosemide with some improvement in shortness of breath.  Glycemic control is excellent with a hemoglobin A1c that was recently 6.8%.  On the other hand his lipid profile is unfavorable with an LDL cholesterol 156 and triglycerides of 190, HDL 49.  His electrocardiogram does not show ischemic changes.  He has minor nonspecific ST-T changes in lead I and aVL.  He is in sinus rhythm.  Past Medical History:  Diagnosis Date   Allergic rhinitis, seasonal    Asthma    Bronchitis     Chronic kidney disease, stage I    collar bone    dislocation left side 05/2014   Degenerative joint disease of knee    bilateral   Diabetes mellitus    GERD (gastroesophageal reflux disease)    Heart murmur    history of   History of acute prostatitis    History of hematuria    Hyperlipidemia    Hypertension    IgA nephropathy    OSA on CPAP    Pneumonia    Sinusitis     Past Surgical History:  Procedure Laterality Date   BIOPSY  05/17/2020   Procedure: BIOPSY;  Surgeon: Clarene Essex, MD;  Location: [facility];  Service: Endoscopy;;   CARDIAC CATHETERIZATION  09/29/2011   normal   CERVICAL SPINE SURGERY     COLONOSCOPY WITH PROPOFOL N/A 05/17/2020   Procedure: COLONOSCOPY WITH PROPOFOL;  Surgeon: Clarene Essex, MD;  Location: [facility];  Service: Endoscopy;  Laterality: N/A;   deviated septum  1930s   ESOPHAGOGASTRODUODENOSCOPY (EGD) WITH PROPOFOL N/A 05/17/2020   Procedure: ESOPHAGOGASTRODUODENOSCOPY (EGD) WITH PROPOFOL;  Surgeon: Clarene Essex, MD;  Location: [facility];  Service: Endoscopy;  Laterality: N/A;   HEMOSTASIS CLIP PLACEMENT  05/17/2020   Procedure: HEMOSTASIS CLIP PLACEMENT;  Surgeon: Clarene Essex, MD;  Location: [facility];  Service: Endoscopy;;   HERNIA REPAIR     LEFT HEART CATHETERIZATION WITH CORONARY ANGIOGRAM N/A 09/29/2011   Procedure: LEFT HEART CATHETERIZATION WITH CORONARY ANGIOGRAM;  Surgeon: Sanda Klein, MD;  Location: [facility];  Service: Cardiovascular;  Laterality: N/A;   NM MYOVIEW LTD  07/08/2011   mild LV dilatation,mild septal hypokinesia   POLYPECTOMY  05/17/2020   Procedure: POLYPECTOMY;  Surgeon: Clarene Essex, MD;  Location: [facility];  Service: Endoscopy;;   TONSILLECTOMY     US ECHOCARDIOGRAPHY  09/07/2011   EF 35-45%,mod.ant hypokinesis,boderline LA & RA enlargement,trace MR,TR & PI    Current Medications: Current Meds  Medication Sig   Ascorbic Acid (VITAMIN C PO) Take 1,000 mg by mouth daily.   Continuous Blood Gluc Sensor  (FREESTYLE LIBRE 14 DAY SENSOR) MISC    Cyanocobalamin (VITAMIN B12) 500 MCG TABS Take 500 mcg by mouth daily.   DULoxetine HCl 60 MG CSDR Take 60 mg by mouth daily.   finasteride (PROSCAR) 5 MG tablet Take 5 mg by mouth daily.   furosemide (LASIX) 20 MG tablet Take 20 mg by mouth daily in the afternoon.   HYDROcodone-acetaminophen (NORCO/VICODIN) 5-325 MG tablet Take one tablet by mouth every 6 hours for severe pain Dx: R07.51   Insulin Aspart FlexPen (NOVOLOG) 100 UNIT/ML Inject 40-60 Units into the skin as needed.   insulin degludec (TRESIBA FLEXTOUCH) 200 UNIT/ML FlexTouch Pen Inject 90 Units into the skin daily in the afternoon.   Insulin Pen Needle (UNIFINE PENTIPS PLUS) 32G X 4 MM MISC USE WITH TRESIBA DAILY   memantine (NAMENDA) 10 MG tablet Take 1 tablet (10 mg total) by mouth 2 (two) times daily.   metFORMIN (GLUCOPHAGE) 1000 MG tablet Take 1 tablet (1,000 mg total) by mouth 2 (two) times daily with a meal. Restart on 8/22 (Patient taking differently: Take 1,000 mg by mouth 2 (two) times daily with a meal.)  metoprolol succinate (TOPROL-XL) 50 MG 24 hr tablet Take 50 mg by mouth 2 (two) times daily. Take with or immediately following a meal.   Omega-3 Fatty Acids (FISH OIL) 1000 MG CPDR Take 2,000 Units by mouth daily.   pantoprazole (PROTONIX) 40 MG tablet Take 40 mg by mouth daily.   sacubitril-valsartan (ENTRESTO) 49-51 MG Take 1 tablet by mouth 2 (two) times daily.   spironolactone (ALDACTONE) 25 MG tablet Take 1 tablet (25 mg total) by mouth daily.   [DISCONTINUED] diltiazem (CARDIZEM CD) 240 MG 24 hr capsule Take 240 mg by mouth daily.   [DISCONTINUED] irbesartan-hydrochlorothiazide (AVALIDE) 300-12.5 MG tablet Take 1 tablet by mouth every evening.      Allergies:   Aricept [donepezil hcl], Bactrim [sulfamethoxazole-trimethoprim], Ciprofloxacin, and Levofloxacin   Social History   Socioeconomic History   Marital status: Married    Spouse name: Diane   Number of children: 1    Years of education: 14   Highest education level: Not on file  Occupational History   Occupation: Herbalist: Interior and spatial designer  Tobacco Use   Smoking status: Never   Smokeless tobacco: Never   Tobacco comments:    Second hand smoke for 26 years per pt  Vaping Use   Vaping Use: Never used  Substance and Sexual Activity   Alcohol use: No    Alcohol/week: 0.0 standard drinks   Drug use: No   Sexual activity: Not on file  Other Topics Concern   Not on file  Social History Narrative   Patient is married (Diane) and lives at home with his wife.   Patient has one child.   Patient is working full-time.   Patient has a Geophysicist/field seismologist.   Patient is left-handed.   Patient drinks one glass of tea daily, one soda daily.   Social Determinants of Health   Financial Resource Strain: Not on file  Food Insecurity: Not on file  Transportation Needs: Not on file  Physical Activity: Not on file  Stress: Not on file  Social Connections: Not on file     Family History: The patient's family history includes Coronary artery disease in his father; Diabetes type II in his father; Heart attack in his father.  ROS:   Please see the history of present illness.     All other systems reviewed and are negative.  EKGs/Labs/Other Studies Reviewed:    The following studies were reviewed today: Notes/labs/imaging studies from ED visit 03/27/2021  Echocardiogram 03/22/2021  EKG:  EKG is ordered today.  The ekg ordered today demonstrates normal sinus rhythm, nonspecific ST-T changes in 1/aVL, QTC 449 ms  Recent Labs: 10/18/2020: TSH 1.640 03/27/2021: ALT 37; B Natriuretic Peptide 9.5; Hemoglobin 13.1; Platelets 290 03/30/2021: BUN 55; Creatinine, Ser 1.58; Potassium 3.8; Pro B Natriuretic peptide (BNP) 7.0; Sodium 139  Recent Lipid Panel No results found for: CHOL, TRIG, HDL, CHOLHDL, VLDL, LDLCALC, LDLDIRECT   Risk Assessment/Calculations:           Physical Exam:     VS:  BP 110/72 (BP Location: Left Arm, Patient Position: Sitting, Cuff Size: Large)    Pulse 78    Ht 5\' 9"  (1.753 m)    Wt 198 lb (89.8 kg)    SpO2 95%    BMI 29.24 kg/m     Wt Readings from Last 3 Encounters:  04/22/21 198 lb (89.8 kg)  03/30/21 196 lb 9.6 oz (89.2 kg)  03/27/21 207 lb 3.7 oz (94 kg)  GEN:  Well nourished, well developed in no acute distress HEENT: Normal NECK: No JVD; No carotid bruits LYMPHATICS: No lymphadenopathy CARDIAC: RRR, no murmurs, rubs, gallops RESPIRATORY:  Clear to auscultation without rales, wheezing or rhonchi  ABDOMEN: Soft, non-tender, non-distended MUSCULOSKELETAL:  No edema; No deformity  SKIN: Warm and dry NEUROLOGIC:  Alert and oriented x 3 PSYCHIATRIC:  Normal affect   ASSESSMENT:    1. Chronic systolic heart failure ([place])   2. Precordial pain   3. Type 2 diabetes mellitus without complication, with long-term current use of insulin ([place])   4. Essential hypertension   5. Mixed hyperlipidemia   6. OSA (obstructive sleep apnea)   7. Cognitive deficits    PLAN:    In order of problems listed above:  CHF: Mr. Marvin appears to have slowly progressive and very well compensated systolic heart failure with an EF that is now down to 35-40%.  He had normal coronaries by angiography in 2013, but he has had a recent episode of atypical chest discomfort.  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  Need to exclude superimposed CAD.  Stop diltiazem.  Replace irbesartan-hydrochlorothiazide with Entresto.  Add spironolactone.  Bring back fairly quickly to see if we can increase the Entresto to maximum dose.  We will probably also benefit from titrating up his dose of metoprolol succinate if blood pressure allows.  Reviewed the importance of sodium restriction, daily weight monitoring, signs and symptoms of heart no exacerbation.  He should continue exercising, but should avoid training  programs that involves straining/anaerobic range exercise.  Note that he is 10 pounds lighter now than he was at the time of his emergency room visit when he had edema and worsening shortness of breath.  Suggesting his "dry weight" is less than 200 pounds. Chest pain: Atypical, without ECG or troponin abnormalities.  We will check coronary CT angiography to make sure he has not developed superimposed coronary disease, since he does have multiple coronary risk factors (most of these are well addressed with exception of hypercholesterolemia). DM: Well-controlled with most recent hemoglobin A1c of 6.8%.  With depressed EF he would seem to be an excellent candidate for SGLT2 inhibitors, but he has recurrent problems with urinary tract infection and urethral obstruction.  We will avoid using these agents at least for the time being. HTN: Target BP less than 130/80, preferentially using maximum tolerated doses of Entresto, metoprolol succinate or carvedilol and spironolactone.  Avoid central acting calcium channel blocker such as diltiazem. HLP: Since he has diabetes mellitus and evidence of coronary atherosclerosis on CT, would benefit from lipid-lowering therapy to bring his LDL cholesterol less than 70 (or at least less than 100.  We will refine that indication based on his coronary calcium score and the presence/absence of coronary stenosis on his CT. OSA: Compliant with CPAP.  Denies daytime hypersomnolence.  Monitored by Dr. Brett Fairy. Cognitive deficits: Also followed by Dr. Brett Fairy in the neurology clinic.  Note absence of specific abnormalities on PET scan of the brain and MRI of the brain (although he does have some mild chronic microvascular ischemic changes in the hemispheres and pons).        Medication Adjustments/Labs and Tests Ordered: Current medicines are reviewed at length with the patient today.  Concerns regarding medicines are outlined above.  Orders Placed This Encounter  Procedures    CT CORONARY MORPH W/CTA COR W/SCORE W/CA W/CM &/OR WO/CM   Basic metabolic  panel   Brain natriuretic peptide   Magnesium   Meds ordered this encounter  Medications   sacubitril-valsartan (ENTRESTO) 49-51 MG    Sig: Take 1 tablet by mouth 2 (two) times daily.    Dispense:  60 tablet    Refill:  11   spironolactone (ALDACTONE) 25 MG tablet    Sig: Take 1 tablet (25 mg total) by mouth daily.    Dispense:  90 tablet    Refill:  3    Patient Instructions  Medication Instructions:  STOP the Irbesartan-Hydrochlorothiazide STOP the Diltiazem  START Entresto 49-51 mg twice daily START Spironolactone 25 mg once daily   *If you need a refill on your cardiac medications before your next appointment, please call your pharmacy*   Lab Work: Your provider would like for you to return in 3-4 weeks at your pharmd appointment to have the following labs drawn: BMET. BNP  and Magensium. You do not need an appointment for the lab. Once in our office lobby there is a podium where you can sign in and ring the doorbell to alert [place] that you are here. The lab is open from 8:00 am to 4:30 pm; closed for lunch from 12:45pm-1:45pm.  If you have labs (blood work) drawn today and your tests are completely normal, you will receive your results only by: [place] (if you have MyChart) OR A paper copy in the mail If you have any lab test that is abnormal or we need to change your treatment, we will call you to review the results.  Follow-Up: At [facility], you and your health needs are our priority.  As part of our continuing mission to provide you with exceptional heart care, we have created designated Provider Care Teams.  These Care Teams include your primary Cardiologist (physician) and Advanced Practice Providers (APPs -  Physician Assistants and Nurse Practitioners) who all work together to provide you with the care you need, when you need it.  We recommend signing up for the patient portal  called "MyChart".  Sign up information is provided on this After Visit Summary.  MyChart is used to connect with patients for Virtual Visits (Telemedicine).  Patients are able to view lab/test results, encounter notes, upcoming appointments, etc.  Non-urgent messages can be sent to your provider as well.   To learn more about what you can do with MyChart, go to [email or web address].    Your next appointment:   Follow up in 3-4 weeks with pharmd for medication titration Follow up in 3-4 months with Dr. Sallyanne Kuster   Other Instructions   Your cardiac CT will be scheduled at one of the below locations:   [facility] [address] [place], [place] [address] [phone]  [place] [address] [place], [place] [address] [phone]  If scheduled at [facility], please arrive at the [facility] main entrance (entrance A) of [facility] 30 minutes prior to test start time. You can use the FREE valet parking offered at the main entrance (encouraged to control the heart rate for the test) Proceed to the [facility] Radiology Department (first floor) to check-in and test prep.  If scheduled at [facility], please arrive 15 mins early for check-in and test prep.  Please follow these instructions carefully (unless otherwise directed):  Hold all erectile dysfunction medications at least 3 days (72 hrs) prior to test.  On the Night Before the Test:  Be sure to Drink plenty of water. Do not consume any caffeinated/decaffeinated beverages or chocolate 12 hours prior to your test. Do not take any antihistamines 12 hours prior to your test.  On the Day of the Test: Drink plenty of water until 1 hour prior to the test. Do not eat any food 4 hours prior to the test. You may take your regular medications prior to the test.  Take an extra metoprolol (Lopressor) 50 mg two hours prior to  test. HOLD Furosemide/Spironolactone morning of the test.       After the Test: Drink plenty of water. After receiving IV contrast, you may experience a mild flushed feeling. This is normal. On occasion, you may experience a mild rash up to 24 hours after the test. This is not dangerous. If this occurs, you can take Benadryl 25 mg and increase your fluid intake. If you experience trouble breathing, this can be serious. If it is severe call 911 IMMEDIATELY. If it is mild, please call our office. If you take any of these medications: Glipizide/Metformin, Avandament, Glucavance, please do not take 48 hours after completing test unless otherwise instructed.  Please allow 2-4 weeks for scheduling of routine cardiac CTs. Some insurance companies require a pre-authorization which may delay scheduling of this test.   For non-scheduling related questions, please contact the cardiac imaging nurse navigator should you have any questions/concerns: Marchia Bond, Cardiac Imaging Nurse Navigator Gordy Clement, Cardiac Imaging Nurse Navigator [facility] Direct Office Dial: [phone]   For scheduling needs, including cancellations and rescheduling, please call [place], [phone].     Signed, Sanda Klein, MD  04/26/2021 8:51 AM    [facility]

## 2021-04-22 NOTE — Patient Instructions (Signed)
Medication Instructions:  STOP the Irbesartan-Hydrochlorothiazide STOP the Diltiazem  START Entresto 49-51 mg twice daily START Spironolactone 25 mg once daily   *If you need a refill on your cardiac medications before your next appointment, please call your pharmacy*   Lab Work: Your provider would like for you to return in 3-4 weeks at your pharmd appointment to have the following labs drawn: BMET. BNP  and Magensium. You do not need an appointment for the lab. Once in our office lobby there is a podium where you can sign in and ring the doorbell to alert [place] that you are here. The lab is open from 8:00 am to 4:30 pm; closed for lunch from 12:45pm-1:45pm.  If you have labs (blood work) drawn today and your tests are completely normal, you will receive your results only by: [place] (if you have MyChart) OR A paper copy in the mail If you have any lab test that is abnormal or we need to change your treatment, we will call you to review the results.  Follow-Up: At [facility], you and your health needs are our priority.  As part of our continuing mission to provide you with exceptional heart care, we have created designated Provider Care Teams.  These Care Teams include your primary Cardiologist (physician) and Advanced Practice Providers (APPs -  Physician Assistants and Nurse Practitioners) who all work together to provide you with the care you need, when you need it.  We recommend signing up for the patient portal called "MyChart".  Sign up information is provided on this After Visit Summary.  MyChart is used to connect with patients for Virtual Visits (Telemedicine).  Patients are able to view lab/test results, encounter notes, upcoming appointments, etc.  Non-urgent messages can be sent to your provider as well.   To learn more about what you can do with MyChart, go to [email or web address].    Your next appointment:   Follow up in 3-4 weeks with pharmd for medication  titration Follow up in 3-4 months with Dr. Sallyanne Kuster   Other Instructions   Your cardiac CT will be scheduled at one of the below locations:   [facility] [address] [place], [place] [address] [phone]  [place] [address] [place], [place] [address] [phone]  If scheduled at [facility], please arrive at the [facility] main entrance (entrance A) of [facility] 30 minutes prior to test start time. You can use the FREE valet parking offered at the main entrance (encouraged to control the heart rate for the test) Proceed to the [facility] Radiology Department (first floor) to check-in and test prep.  If scheduled at [facility], please arrive 15 mins early for check-in and test prep.  Please follow these instructions carefully (unless otherwise directed):  Hold all erectile dysfunction medications at least 3 days (72 hrs) prior to test.  On the Night Before the Test: Be sure to Drink plenty of water. Do not consume any caffeinated/decaffeinated beverages or chocolate 12 hours prior to your test. Do not take any antihistamines 12 hours prior to your test.  On the Day of the Test: Drink plenty of water until 1 hour prior to the test. Do not eat any food 4 hours prior to the test. You may take your regular medications prior to the test.  Take an extra metoprolol (Lopressor) 50 mg two hours prior to test. HOLD Furosemide/Spironolactone morning of  the test.       After the Test: Drink plenty of water. After receiving IV contrast, you may experience a mild flushed feeling. This is normal. On occasion, you may experience a mild rash up to 24 hours after the test. This is not dangerous. If this occurs, you can take Benadryl 25 mg and increase your fluid intake. If you experience trouble breathing, this can be serious. If it is severe call 911 IMMEDIATELY. If  it is mild, please call our office. If you take any of these medications: Glipizide/Metformin, Avandament, Glucavance, please do not take 48 hours after completing test unless otherwise instructed.  Please allow 2-4 weeks for scheduling of routine cardiac CTs. Some insurance companies require a pre-authorization which may delay scheduling of this test.   For non-scheduling related questions, please contact the cardiac imaging nurse navigator should you have any questions/concerns: Marchia Bond, Cardiac Imaging Nurse Navigator Gordy Clement, Cardiac Imaging Nurse Navigator [facility] Direct Office Dial: [phone]   For scheduling needs, including cancellations and rescheduling, please call [place], [phone].

## 2021-04-25 ENCOUNTER — Telehealth: Payer: Self-pay | Admitting: Cardiovascular Disease

## 2021-04-25 NOTE — Telephone Encounter (Signed)
Spoke with pt regarding clearance to continue with his personal trainer. Pt would like for the letter to be sent via [employer]. Pt states letter just needs to state that he is cleared with no restrictions per Dr. Sallyanne Kuster. Will forward to Dr. Sallyanne Kuster for ok to clear.

## 2021-04-25 NOTE — Telephone Encounter (Signed)
Pt is needing clearance to continue personal training... please advise.

## 2021-04-27 ENCOUNTER — Encounter: Payer: Self-pay | Admitting: Cardiovascular Disease

## 2021-04-27 NOTE — Telephone Encounter (Signed)
Patient has been made aware that we cannot do a letter that states no restrictions for exercise. He stated that he needs a letter for his trainer that gives restrictions and basic guidelines for him from a cardiac standpoint. It can be sent to MyChart for him.

## 2021-05-04 ENCOUNTER — Other Ambulatory Visit (INDEPENDENT_AMBULATORY_CARE_PROVIDER_SITE_OTHER): Payer: HMO

## 2021-05-04 DIAGNOSIS — I5022 Chronic systolic (congestive) heart failure: Secondary | ICD-10-CM

## 2021-05-04 DIAGNOSIS — I10 Essential (primary) hypertension: Secondary | ICD-10-CM

## 2021-05-04 DIAGNOSIS — R072 Precordial pain: Secondary | ICD-10-CM

## 2021-05-04 DIAGNOSIS — G4733 Obstructive sleep apnea (adult) (pediatric): Secondary | ICD-10-CM | POA: Diagnosis not present

## 2021-05-06 ENCOUNTER — Telehealth (HOSPITAL_COMMUNITY): Payer: Self-pay | Admitting: Emergency Medicine

## 2021-05-06 DIAGNOSIS — I5022 Chronic systolic (congestive) heart failure: Secondary | ICD-10-CM | POA: Diagnosis not present

## 2021-05-06 NOTE — Telephone Encounter (Signed)
Reaching out to patient to offer assistance regarding upcoming cardiac imaging study; pt verbalizes understanding of appt date/time, parking situation and where to check in, pre-test NPO status and medications ordered, and verified current allergies; name and call back number provided for further questions should they arise Marchia Bond RN Navigator [facility] [phone] office [phone] cell  Spoke to patients wife, reminded her that patient needs labs prior to CT (avoiding Monday d/t holiday) Taking extra dose of toprol 2 hr prior to scan Denies iv issues Arrival 11:00

## 2021-05-07 LAB — BASIC METABOLIC PANEL
BUN/Creatinine Ratio: 32 — ABNORMAL HIGH (ref 10–24)
BUN: 34 mg/dL — ABNORMAL HIGH (ref 8–27)
CO2: 23 mmol/L (ref 20–29)
Calcium: 9.8 mg/dL (ref 8.6–10.2)
Chloride: 102 mmol/L (ref 96–106)
Creatinine, Ser: 1.07 mg/dL (ref 0.76–1.27)
Glucose: 116 mg/dL — ABNORMAL HIGH (ref 70–99)
Potassium: 4.4 mmol/L (ref 3.5–5.2)
Sodium: 142 mmol/L (ref 134–144)
eGFR: 75 mL/min/{1.73_m2} (ref >59)

## 2021-05-07 LAB — MAGNESIUM: Magnesium: 1.8 mg/dL (ref 1.6–2.3)

## 2021-05-07 LAB — BRAIN NATRIURETIC PEPTIDE: BNP: 6.6 pg/mL (ref 0.0–100.0)

## 2021-05-10 ENCOUNTER — Ambulatory Visit: Payer: HMO | Admitting: Neurology

## 2021-05-10 ENCOUNTER — Encounter: Payer: Self-pay | Admitting: Neurology

## 2021-05-10 VITALS — BP 119/74 | HR 94 | Ht 69.0 in | Wt 193.5 lb

## 2021-05-10 DIAGNOSIS — I429 Cardiomyopathy, unspecified: Secondary | ICD-10-CM | POA: Insufficient documentation

## 2021-05-10 DIAGNOSIS — G4730 Sleep apnea, unspecified: Secondary | ICD-10-CM | POA: Diagnosis not present

## 2021-05-10 DIAGNOSIS — I5020 Unspecified systolic (congestive) heart failure: Secondary | ICD-10-CM

## 2021-05-10 DIAGNOSIS — G3184 Mild cognitive impairment, so stated: Secondary | ICD-10-CM

## 2021-05-10 DIAGNOSIS — G4710 Hypersomnia, unspecified: Secondary | ICD-10-CM

## 2021-05-10 DIAGNOSIS — G4733 Obstructive sleep apnea (adult) (pediatric): Secondary | ICD-10-CM | POA: Diagnosis not present

## 2021-05-10 DIAGNOSIS — N183 Chronic kidney disease, stage 3 unspecified: Secondary | ICD-10-CM

## 2021-05-10 DIAGNOSIS — Z9989 Dependence on other enabling machines and devices: Secondary | ICD-10-CM

## 2021-05-10 NOTE — Patient Instructions (Signed)
Sleep Apnea Sleep apnea is a condition in which breathing pauses or becomes shallow during sleep. People with sleep apnea usually snore loudly. They may have times when they gasp and stop breathing for 10 seconds or more during sleep. This may happen many times during the night. Sleep apnea disrupts your sleep and keeps your body from getting the rest that it needs. This condition can increase your risk of certain health problems, including: Heart attack. Stroke. Obesity. Type 2 diabetes. Heart failure. Irregular heartbeat. High blood pressure. The goal of treatment is to help you breathe normally again. What are the causes? The most common cause of sleep apnea is a collapsed or blocked airway. There are three kinds of sleep apnea: Obstructive sleep apnea. This kind is caused by a blocked or collapsed airway. Central sleep apnea. This kind happens when the part of the brain that controls breathing does not send the correct signals to the muscles that control breathing. Mixed sleep apnea. This is a combination of obstructive and central sleep apnea. What increases the risk? You are more likely to develop this condition if you: Are overweight. Smoke. Have a smaller than normal airway. Are older. Are male. Drink alcohol. Take sedatives or tranquilizers. Have a family history of sleep apnea. Have a tongue or tonsils that are larger than normal. What are the signs or symptoms? Symptoms of this condition include: Trouble staying asleep. Loud snoring. Morning headaches. Waking up gasping. Dry mouth or sore throat in the morning. Daytime sleepiness and tiredness. If you have daytime fatigue because of sleep apnea, you may be more likely to have: Trouble concentrating. Forgetfulness. Irritability or mood swings. Personality changes. Feelings of depression. Sexual dysfunction. This may include loss of interest if you are male, or erectile dysfunction if you are male. How is this  diagnosed? This condition may be diagnosed with: A medical history. A physical exam. A series of tests that are done while you are sleeping (sleep study). These tests are usually done in a sleep lab, but they may also be done at home. How is this treated? Treatment for this condition aims to restore normal breathing and to ease symptoms during sleep. It may involve managing health issues that can affect breathing, such as high blood pressure or obesity. Treatment may include: Sleeping on your side. Using a decongestant if you have nasal congestion. Avoiding the use of depressants, including alcohol, sedatives, and narcotics. Losing weight if you are overweight. Making changes to your diet. Quitting smoking. Using a device to open your airway while you sleep, such as: An oral appliance. This is a custom-made mouthpiece that shifts your lower jaw forward. A continuous positive airway pressure (CPAP) device. This device blows air through a mask when you breathe out (exhale). A nasal expiratory positive airway pressure (EPAP) device. This device has valves that you put into each nostril. A bi-level positive airway pressure (BIPAP) device. This device blows air through a mask when you breathe in (inhale) and breathe out (exhale). Having surgery if other treatments do not work. During surgery, excess tissue is removed to create a wider airway. Follow these instructions at home: Lifestyle Make any lifestyle changes that your health care provider recommends. Eat a healthy, well-balanced diet. Take steps to lose weight if you are overweight. Avoid using depressants, including alcohol, sedatives, and narcotics. Do not use any products that contain nicotine or tobacco. These products include cigarettes, chewing tobacco, and vaping devices, such as e-cigarettes. If you need help quitting, ask your  health care provider. General instructions Take over-the-counter and prescription medicines only as told  by your health care provider. If you were given a device to open your airway while you sleep, use it only as told by your health care provider. If you are having surgery, make sure to tell your health care provider you have sleep apnea. You may need to bring your device with you. Keep all follow-up visits. This is important. Contact a health care provider if: The device that you received to open your airway during sleep is uncomfortable or does not seem to be working. Your symptoms do not improve. Your symptoms get worse. Get help right away if: You develop: Chest pain. Shortness of breath. Discomfort in your back, arms, or stomach. You have: Trouble speaking. Weakness on one side of your body. Drooping in your face. These symptoms may represent a serious problem that is an emergency. Do not wait to see if the symptoms will go away. Get medical help right away. Call your local emergency services (911 in the [place] Do not drive yourself to the hospital. Summary Sleep apnea is a condition in which breathing pauses or becomes shallow during sleep. The most common cause is a collapsed or blocked airway. The goal of treatment is to restore normal breathing and to ease symptoms during sleep. This information is not intended to replace advice given to you by your health care provider. Make sure you discuss any questions you have with your health care provider. Document Revised: 12/01/2020 Document Reviewed: 04/02/2020 Elsevier Patient Education  2022 [employer]. Hypersomnia Hypersomnia is a condition in which a person feels very tired during the day even though he or she gets plenty of sleep at night. A person with this condition may take naps during the day and may find it very difficult to wake up from sleep. Hypersomnia may affect a person's ability to think, concentrate, drive, or remember things. What are the causes? The cause of this condition may not be known. Possible causes  include: Certain medicines. Sleep disorders, such as narcolepsy and sleep apnea. Injury to the head, brain, or spinal cord. Drug or alcohol use. Gastroesophageal reflux disease (GERD). Tumors. Certain medical conditions, such as depression, diabetes, or an underactive thyroid gland (hypothyroidism). What are the signs or symptoms? The main symptoms of hypersomnia include: Feeling very tired throughout the day, regardless of how much sleep you got the night before. Having trouble waking up. Others may find it difficult to wake you up when you are sleeping. Sleeping for longer and longer periods at a time. Taking naps throughout the day. Other symptoms may include: Feeling restless, anxious, or annoyed. Lacking energy. Having trouble with: Remembering. Speaking. Thinking. Loss of appetite. Seeing, hearing, tasting, smelling, or feeling things that are not real (hallucinations). How is this diagnosed? This condition may be diagnosed based on: Your symptoms and medical history. Your sleeping habits. Your health care provider may ask you to write down your sleeping habits in a daily sleep log, along with any symptoms you have. A series of tests that are done while you sleep (sleep study or polysomnogram). A test that measures how quickly you can fall asleep during the day (daytime nap study or multiple sleep latency test). How is this treated? Treatment can help you manage your condition. Treatment may include: Following a regular sleep routine. Lifestyle changes, such as changing your eating habits, getting regular exercise, and avoiding alcohol or caffeinated beverages. Taking medicines to make you more alert (stimulants)  during the day. Treating any underlying medical causes of hypersomnia. Follow these instructions at home: Sleep routine  Schedule the same bedtime and wake-up time each day. Practice a relaxing bedtime routine. This may include reading, meditation, deep  breathing, or taking a warm bath before going to sleep. Get regular exercise each day. Avoid strenuous exercise in the evening hours. Keep your sleep environment at a cooler temperature, darkened, and quiet. Sleep with pillows and a mattress that are comfortable and supportive. Schedule short 20-minute naps for when you feel sleepiest during the day. Talk with your employer or teachers about your hypersomnia. If possible, adjust your schedule so that: You have a regular daytime work schedule. You can take a scheduled nap during the day. You do not have to work or be active at night. Do not eat a heavy meal for a few hours before bedtime. Eat your meals at about the same times every day. Avoid drinking alcohol or caffeinated beverages. Safety  Do not drive or use heavy machinery if you are sleepy. Ask your health care provider if it is safe for you to drive. Wear a life jacket when swimming or spending time near water. General instructions Take supplements and over-the-counter and prescription medicines only as told by your health care provider. Keep a sleep log that will help your doctor manage your condition. This may include information about: What time you go to bed each night. How often you wake up at night. How many hours you sleep at night. How often and for how long you nap during the day. Any observations from others, such as leg movements during sleep, sleep walking, or snoring. Keep all follow-up visits as told by your health care provider. This is important. Contact a health care provider if: You have new symptoms. Your symptoms get worse. Get help right away if: You have serious thoughts about hurting yourself or someone else. If you ever feel like you may hurt yourself or others, or have thoughts about taking your own life, get help right away. You can go to your nearest emergency department or call: Your local emergency services (911 in the [place] A suicide crisis  helpline, such as the [facility] at (973)094-9956 or 988 in the [place]. This is open 24 hours a day. Summary Hypersomnia refers to a condition in which you feel very tired during the day even though you get plenty of sleep at night. A person with this condition may take naps during the day and may find it very difficult to wake up from sleep. Hypersomnia may affect a person's ability to think, concentrate, drive, or remember things. Treatment, such as following a regular sleep routine and making some lifestyle changes, can help you manage your condition. This information is not intended to replace advice given to you by your health care provider. Make sure you discuss any questions you have with your health care provider. Document Revised: 11/17/2020 Document Reviewed: 03/04/2020 Elsevier Patient Education  2022 [employer].

## 2021-05-10 NOTE — Progress Notes (Addendum)
SLEEP MEDICINE CLINIC    Provider:  Larey Seat, MD  Primary Care Physician:  Prince Solian, [place] [address]     Referring Provider: Prince Solian, [address],  [place] [address]          Chief Complaint according to patient   Patient presents with:     New Patient (Initial Visit)     Follow -up evaluation of memory loss in established Sleep patient.  Confusional episodes, forgetting short term , forgetting numbers, executive failures.  Pt had stopped the aricept due to nausea and increased headaches. Since stopping the med, the nausea is better but he still waking up with headaches.DME [facility] ([facility]) .  they have held -off starting the namenda.  . Here to f/u for memory. Pt reports memory has been stable, no changes. Pt has been tolerating Memantine with no SE. Pt started CPAP last week and feels like pressure is a little to high. MOCA 26      HISTORY OF PRESENT ILLNESS:  Donald [name] is a 71 y.o. year old Caucasian male patient seen here in a RV on 05/10/2021.  Last seen 10-18-2020.   05-10-2021: Still reports headaches in cycles. New diagnoses of CHF, swelling to the legs , abdominal distention. Dr Benn Moulder stated left ventricular dysfunction. No more alcohol. Fatty liver. Renal dysfunction, IGA nephropathy. Pulmonologist ordered a test for cardiac function, echo-   Here to f/u for memory. Pt reports memory has been stable, no changes. Pt has been tolerating Memantine with no SE.  Pt started new CPAP just last week and feels like pressure is a little to high. MOCA score 27/ 30 points. Still reports headaches in cycles. History of hypoxia and sleep apnea, AHI 37/ h by HST on 10-03-2018.   Echo report and neurocognitive repot reviewed and MRI/PET scan reviewed.   IMPRESSIONS Left ventricular ejection fraction, by estimation, is 35 to 40%. The left ventricle has moderately decreased function. The left ventricle  demonstrates global hypokinesis. Left ventricular diastolic parameters are consistent with Grade I diastolic dysfunction (impaired relaxation). 1. Right ventricular systolic function is low normal. The right ventricular size is normal. There is normal pulmonary artery systolic pressure. The estimated right ventricular systolic pressure is 47.6 mmHg. 2. 3. The mitral valve is grossly normal. Trivial mitral valve regurgitation. 4. The aortic valve is tricuspid. Aortic valve regurgitation is not visualized. The inferior vena cava is normal in size with greater than 50% respiratory variability, suggesting right atrial pressure of 3 mmHg. 5. Comparison(s): Prior images unable to be directly viewed, comparison made by report only. Changes from prior study are noted. 09/06/2011: LVEF 35-45%. FINDINGS Left Ventricle: Left ventricular ejection fraction, by estimation, is 35 to 40%. The left ventricle has moderately decreased function. The left ventricle demonstrates global hypokinesis. The left ventricular internal cavity size was normal in size. There is no left ventricular hypertrophy. Left ventricular diastolic parameters are consistent with Grade I diastolic dysfunction (impaired relaxation). Indeterminate filling pressures.  06/ 13/ 2022.   Donald [name] has had to stop the Aricept due to nausea and increased headaches.  After he stopped the Aricept nausea Better he still has headaches but normal 6 months.  Headaches have been responding to ibuprofen but he was warned not to take ibuprofen due to kidney disease he feels that alternating with Tylenol has not had the desired effect,.  For a urinary tract infection at the end of May he  was treated with antibiotics that he did not tolerate.  He was given a prednisone Dosepak which made his otherwise more likely inflammatory symptoms disappear and he felt much better, less in pain and also mentally clear and more relaxed.   Since this steroid Dosepak is  now completed as he has seen a repeat return of some of his aches and pains.  Neuropsychology reports for this patients, encourage exercise, retesting need for oxygen ( he is on 02 )  in a sleep study and SPECT scan order. Impulse control is lower than expected, pretty much every cognitive domain was average or even above average except over 20 minute recall and color naming.  He is not seen by pulmonology for oxygen need- small vessel disease of the brain may be related.  BRAIN MRI was unchanged -05-15-2020 Dr. Felecia Shelling, small vessel disease.   Donald [name] has been a compliant CPAP user at 97%, these are data from November through December 2021 however I am not quite sure what happened to the more recent data. Residual AHI is 2.5 how can I link my epic -integrated dragon to my NUANCE PRO handheld microphone? Asencion Partridge Tamieka Rancourt, M.D. 95th percentile air leak is 7.7 L, 95th percentile pressure is 9.5 cmH2O.  All remaining or residual apneas were obstructive.  And with a minimum pressure of 7 maximum pressure of 10 and 2 cm EPR -there needs to be no reset.   04-15-2020: Donald [name] is a 71 y.o. male , seen here as in a referral from [name] for a new evaluation of apnea. Patient is a highly compliant CPAP user since receiving a diagnosis of mild OSA and severe Hypoxemia in 2015. Here today not only to follow up for sleep, but with a concern about memory decline.  Donald [name] is here today with his spouse both voiced a concern about his short-term memory function, and he often has to look at instructions calendars for days again and again because he tends to forget them within minutes he reports.  His primary care physician [name] had already ordered MRI of the brain with CT clinical data of memory loss over the last 2 years.  The patient is status post cervical fusion procedure beginning at C3 and he had no acute findings on his MRI dated 11/22-2021.   Age-related volume loss was commented on mild  chronic small vessel ischemic changes, tortuous and ectatic vessels were noted but no aneurysm or calcification or stenosis.  And left mastoid effusion was coincidentally also commented upon this MRI of the brain was done without contrast probably due to the patient's history of a chronic renal impairment. It is his wife's impression that he mind is getting more and more foggy and he unsure about driving, he has a continuous headache.     PAST VISIT :  Donald [name]. Sofia is a meanwhile 71 year old right-handed Caucasian gentleman and avid motorcyclist.  He was first seen for a sleep evaluation in May 2013 by Dr. Danton Sewer.. This was followed by a sleep study dated 27 October 2011.  He was not happy with the results and care but was given an oxygen concentrator from [place] he remembers that the oxygen was delivered already in March 2013 after an overnight pulse oximetry.  A new sleep evaluation was performed through me at [facility] in December 2014 and he underwent another sleep study here.  On 25 April 2013 his sleep study showed an overall AHI  of only 5.7/h, and very mild degree of apnea but his oxygen nadir was 77% and he spent sustained time in low oxygen.  The total hypoxemia time at 89 and below was 2 hours and 45 minutes, the patient was therefore placed on oxygen supplement which he already owns as I understand.  He returned for a CPAP titration because I can only order oxygen as an adjunct therapy to obstructive sleep apnea or central sleep apnea therapy.  Oxygen strictly for hypoxemia with exercise and not sleep-related would need to be provided by a pulmonologist.   CPAP titration followed on 04 July 2013 his AHI was reduced to 1.9 /h.  He reported significant difficulties to stay asleep.  His sleep latency was 65 minutes in the sleep lab.  I understand that the patient has continued to use his CPAP on a regular basis in spite of not having followed up with me.   The CPAP settings that he uses now at the same but he no longer has oxygen available.  He purchased I understand a pulse oximetry device and has used it for nights with CPAP in place and 1 night without CPAP use.  All nights had prolonged severely prolonged hypoxemia.  This only confirms what we saw in 2015 that his apnea is not the main driver of hypoxemia.  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  GERD, heart murmur, hematuria, hypercholesterolemia and hypertension, history of prostatitis, community-acquired pneumonia.  Coronary artery disease evaluation was negative with an EF of 55% in 2013.   Donald [name] struggles with sinus congestion often has headaches when sleeping on his left side feeling congested and developing a pressure behind the eyes, he has nocturia 2-3 times each night, he appears depressed may be frustrated, stating that he is not excessively daytime sleepy but he also does not feel refreshed or restored in the mornings.  Family medical and sleep history: His father died at age 68 of coronary artery disease he also had diabetes type 2 and degenerative joint joint disease.  His mother died also early at 61 years of age had a history of nephrolithiasis and coronary artery disease.  His sister who is 6 years younger than the patient has thyroid problems she has undergone bariatric surgery for morbid obesity..   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  he has a son age 60, he is married since 1980,  he drinks coffee in the morning and  crystallite with caffeine after 3 PM in the afternoon he has no history of shiftwork. No more alcohol. Fatty liver. Renal dysfunction, IGA nephropathy.    Sleep habits are as follows: The patient no longer has oxygen available but uses his CPAP at 9 cm water pressure which is now over 65 years old and he still using nasal pillows.  He describes a dinnertime at around 7 PM bedtime is usually around 9:30 PM always with CPAP, the bedroom is described as cool, quiet and dark he shares a bedroom with his wife.  He falls asleep usually within 15 minutes he has begun  trying to sleep supine which for an apnea patient is not a preferred sleep position but he uses 3 pillows under wedge to keep himself propped up.  He states that when he sleeps on one side sinus congestion sets in and his nose.  Up.  Usually worse when he sleeps on his left side.  He wakes up 2-3 times each night for nocturia and finally rises at 8 AM but only with alarm.  He does not feel refreshed and restored spontaneously and without alarm would be sleeping until noon.  He does not nap in daytime he reports.  He has sometimes been able to fall asleep in a recliner when he is reading or relaxed.  He has isolated occurrences of sleep paralysis.  As I described he has had multiple pulse oximetry's and perform these on himself with and without CPAP in place and found that he is still significantly hypoxic at this time.  However he is without oxygen. Memory los:    Montreal Cognitive Assessment  05/10/2021 04/15/2020  Visuospatial/ Executive (0/5) 5 4  Naming (0/3) 3 3  Attention: Read list of digits (0/2) 2 2  Attention: Read list of letters (0/1) 1 1  Attention: Serial 7 subtraction starting at 100 (0/3) 2 3  Language: Repeat phrase (0/2) 2 2  Language : Fluency (0/1) 1 1  Abstraction (0/2) 2 2  Delayed Recall (0/5) 2 out of 5 2  Orientation (0/6) 6 6  Total 27 26   MMSE - Mini Mental State Exam 10/18/2020 04/15/2020  Orientation to time 4 5   Orientation to Place 5 5  Registration 3 3  Attention/ Calculation 5 5  Recall 2 3  Language- name 2 objects 2 2  Language- repeat 1 1  Language- follow 3 step command 3 3  Language- read & follow direction 1 -  Write a sentence 1 -  Copy design 1 -  Total score 28 -     Out of a complete 14 system review, the patient complains of only the following symptoms, and all other reviewed systems are negative.:  Fatigue, headaches, irritability, impulse control failure, now angry, with personality changes.    How likely are you to doze in the following situations: 0 = not likely, 1 = slight chance, 2 = moderate chance, 3 = high chance   Sitting and Reading? Watching Television? Sitting inactive in a public place (theater or meeting)? As a passenger in a car for an hour without a break? Lying down in the afternoon when circumstances permit? Sitting and talking to someone? Sitting quietly after lunch without alcohol? In a car, while stopped for a few minutes in traffic?   Total = 8/ 24 points   FSS endorsed at N/A/ 63 points.  GDS not answered.   MOCA 26/ 30 points.  REsMed mask, LUNA G 3 machine.   Cognitive ; ankle edema , coughing.    Social History   Socioeconomic History   Marital status: Married    Spouse name: Diane   Number of children: 1   Years of education: 14   Highest education level: Not on file  Occupational History   Occupation: Herbalist: Interior and spatial designer  Tobacco Use   Smoking status: Never   Smokeless tobacco: Never   Tobacco comments:    Second hand smoke for 26 years per pt  Vaping Use   Vaping Use: Never used  Substance and Sexual Activity   Alcohol use: No  Alcohol/week: 0.0 standard drinks   Drug use: No   Sexual activity: Not on file  Other Topics Concern   Not on file  Social History Narrative   Patient is married (Diane) and lives at home with his wife.   Patient has one child.   Patient is working full-time.    Patient has a Geophysicist/field seismologist.   Patient is left-handed.   Patient drinks one glass of tea daily, one soda daily.   Social Determinants of Health   Financial Resource Strain: Not on file  Food Insecurity: Not on file  Transportation Needs: Not on file  Physical Activity: Not on file  Stress: Not on file  Social Connections: Not on file    Family History  Problem Relation Age of Onset   Coronary artery disease Father    Diabetes type II Father    Heart attack Father     Past Medical History:  Diagnosis Date   Allergic rhinitis, seasonal    Asthma    Bronchitis    Chronic kidney disease, stage I    collar bone    dislocation left side 05/2014   Degenerative joint disease of knee    bilateral   Diabetes mellitus    GERD (gastroesophageal reflux disease)    Heart murmur    history of   History of acute prostatitis    History of hematuria    Hyperlipidemia    Hypertension    IgA nephropathy    OSA on CPAP    Pneumonia    Sinusitis     Past Surgical History:  Procedure Laterality Date   BIOPSY  05/17/2020   Procedure: BIOPSY;  Surgeon: Clarene Essex, MD;  Location: [facility];  Service: Endoscopy;;   CARDIAC CATHETERIZATION  09/29/2011   normal   CERVICAL SPINE SURGERY     COLONOSCOPY WITH PROPOFOL N/A 05/17/2020   Procedure: COLONOSCOPY WITH PROPOFOL;  Surgeon: Clarene Essex, MD;  Location: [facility];  Service: Endoscopy;  Laterality: N/A;   deviated septum  1930s   ESOPHAGOGASTRODUODENOSCOPY (EGD) WITH PROPOFOL N/A 05/17/2020   Procedure: ESOPHAGOGASTRODUODENOSCOPY (EGD) WITH PROPOFOL;  Surgeon: Clarene Essex, MD;  Location: [facility];  Service: Endoscopy;  Laterality: N/A;   HEMOSTASIS CLIP PLACEMENT  05/17/2020   Procedure: HEMOSTASIS CLIP PLACEMENT;  Surgeon: Clarene Essex, MD;  Location: [facility];  Service: Endoscopy;;   HERNIA REPAIR     LEFT HEART CATHETERIZATION WITH CORONARY ANGIOGRAM N/A 09/29/2011   Procedure: LEFT HEART CATHETERIZATION WITH CORONARY  ANGIOGRAM;  Surgeon: Sanda Klein, MD;  Location: [facility];  Service: Cardiovascular;  Laterality: N/A;   NM MYOVIEW LTD  07/08/2011   mild LV dilatation,mild septal hypokinesia   POLYPECTOMY  05/17/2020   Procedure: POLYPECTOMY;  Surgeon: Clarene Essex, MD;  Location: [facility];  Service: Endoscopy;;   TONSILLECTOMY     US ECHOCARDIOGRAPHY  09/07/2011   EF 35-45%,mod.ant hypokinesis,boderline LA & RA enlargement,trace MR,TR & PI     Current Outpatient Medications on File Prior to Visit  Medication Sig Dispense Refill   albuterol (PROVENTIL HFA;VENTOLIN HFA) 108 (90 BASE) MCG/ACT inhaler Inhale 2 puffs into the lungs every 6 (six) hours as needed for wheezing or shortness of breath. For shortness of breath     Ascorbic Acid (VITAMIN C PO) Take 1,000 mg by mouth daily.     Continuous Blood Gluc Sensor (FREESTYLE LIBRE 14 DAY SENSOR) MISC      Cyanocobalamin (VITAMIN B12) 500 MCG TABS Take 500 mcg by mouth daily.  DULoxetine HCl 60 MG CSDR Take 60 mg by mouth daily.     finasteride (PROSCAR) 5 MG tablet Take 5 mg by mouth daily.     furosemide (LASIX) 20 MG tablet Take 20 mg by mouth daily in the afternoon.     HYDROcodone-acetaminophen (NORCO/VICODIN) 5-325 MG tablet Take one tablet by mouth every 6 hours for severe pain Dx: R07.51     Insulin Aspart FlexPen (NOVOLOG) 100 UNIT/ML Inject 40-60 Units into the skin as needed.     insulin degludec (TRESIBA FLEXTOUCH) 200 UNIT/ML FlexTouch Pen Inject 90 Units into the skin daily in the afternoon.     Insulin Pen Needle (UNIFINE PENTIPS PLUS) 32G X 4 MM MISC USE WITH TRESIBA DAILY     memantine (NAMENDA) 10 MG tablet Take 1 tablet (10 mg total) by mouth 2 (two) times daily. 60 tablet 11   metFORMIN (GLUCOPHAGE) 1000 MG tablet Take 1 tablet (1,000 mg total) by mouth 2 (two) times daily with a meal. Restart on 8/22     metoprolol succinate (TOPROL-XL) 50 MG 24 hr tablet Take 50 mg by mouth 2 (two) times daily. Take with or immediately following a  meal.     Omega-3 Fatty Acids (FISH OIL) 1000 MG CPDR Take 2,000 Units by mouth daily.     pantoprazole (PROTONIX) 40 MG tablet Take 40 mg by mouth daily.     sacubitril-valsartan (ENTRESTO) 49-51 MG Take 1 tablet by mouth 2 (two) times daily. 60 tablet 11   silodosin (RAPAFLO) 8 MG CAPS capsule Take 8 mg by mouth daily.     spironolactone (ALDACTONE) 25 MG tablet Take 1 tablet (25 mg total) by mouth daily. 90 tablet 3   No current facility-administered medications on file prior to visit.    Allergies  Allergen Reactions   Aricept [Donepezil Hcl] Nausea Only   Bactrim [Sulfamethoxazole-Trimethoprim] Hives   Ciprofloxacin     Tendons problem in shoulder   Levofloxacin Other ([name] Comments)    Muscle tighten up in left and right calf    Physical exam:  Today's Vitals   05/10/21 1533  BP: 119/74  Pulse: 94  Weight: 193 lb 8 oz (87.8 kg)  Height: _0  (1.753 m)   Body mass index is 28.57 kg/m.   Wt Readings from Last 3 Encounters:  05/10/21 193 lb 8 oz (87.8 kg)  04/22/21 198 lb (89.8 kg)  03/30/21 196 lb 9.6 oz (89.2 kg)     Ht Readings from Last 3 Encounters:  05/10/21 _1  (1.753 m)  04/22/21 _2  (1.753 m)  03/30/21 _3  (1.753 m)      General: The patient is awake, alert and appears not in acute distress. The patient is well groomed. Head: Normocephalic, atraumatic. Cardiovascular:  Regular rate and cardiac rhythm by pulse,  without distended neck veins. Respiratory: Lungs are clear to auscultation.  Skin:  Without evidence of ankle edema, or rash. Trunk: The patient's posture is erect.   Neurologic exam : The patient is awake and alert, oriented to place and time.   Memory subjective described as impaired-  Montreal Cognitive Assessment  05/10/2021 04/15/2020  Visuospatial/ Executive (0/5) 5 4  Naming (0/3) 3 3  Attention: Read list of digits (0/2) 2 2  Attention: Read list of letters (0/1) 1 1  Attention: Serial 7 subtraction starting at 100 (0/3) 2 3   Language: Repeat phrase (0/2) 2 2  Language : Fluency (0/1) 1 1  Abstraction (0/2) 2 2  Delayed  Recall (0/5) 2 2  Orientation (0/6) 6 6  Total 26 26      Attention span & concentration ability appears restricted.  Speech is fluent,  without  dysarthria, dysphonia or aphasia.  Mood and affect are here normal, but wife reports episode of inappropriate anger, blunt comments. .   Cranial nerves: no loss of smell or taste reported  Pupils are equal and briskly reactive to light. Funduscopic exam deferred.  Extraocular movements in vertical and horizontal planes were intact and without nystagmus.  No Diplopia. Visual fields by finger perimetry are intact. Hearing was intact to soft voice and finger rubbing.    Facial sensation intact to fine touch.  Facial motor strength is symmetric and tongue and uvula move midline.  Neck ROM : rotation, tilt and flexion extension were normal for age and ligament damage-shoulder shrug was symmetrical- but he reports shoulder pain, arthritis, having lost arm swing. .    Motor exam:  Symmetric bulk, tone and ROM.   Normal tone without cog- wheeling, symmetric grip strength .   Sensory:  Fine touch and vibration were lost in both feet /ankles.  Proprioception tested in the upper extremities was normal.   Coordination: Rapid alternating movements in the fingers/hands were slowed.  The Finger-to-nose maneuver was intact without evidence of ataxia, dysmetria or tremor.   Gait and station: Patient could rise unassisted from a seated position, walked without assistive device.  Stance is of normal width/ base and the patient turned with 4 steps.  He walked with reduced arm swing on the left versus right  Toe and heel walk were deferred.  Deep tendon reflexes: in the  upper and lower extremities are symmetric and trace.      After spending a total time of 25 minutes face to face and additional time for physical and neurologic examination, review of  laboratory studies,  personal review of imaging studies, reports and results of other testing and review of referral information / records as far as provided in visit, I have established the following assessments:  1)  OSA- severe and well controlled on new auto CPAP, this is a replacement machine.  He uses nasal pillows and pressures between 7-12 cm water.  Total sleep latency was found to be 65 minutes in sleep lab during CPAP titration in 06/2013. Oxygen for hypoxemia no longer needed?   New CPAP just delivered last days of Dec 2022. with nasal pillows.  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 However, there are a few areas of relative weakness suggestive of mild fronto-subcortical dysfunction (e.g., variable encoding abilities with intact consolidation; decreased information processing speed). 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  3)  high degree of sleepiness, long sleep time and high fatigue.  CHF systolic 97-94 % EF.  Also Renal dysfunction. Could he have liver failure or dysfunction as well. He was never an alcohol abuser and not a smoker. .   My Plan is to proceed with:  1)   Namenda , will maintain on 10 mg bid. MOCA was improved. Hymera  30 stabile.  2) I like to ask Dr Dagmar Hait to consider a hepatic MRI and /or [place]. Patient reports bloating, and extension under rib cage. This may contribute to fatigue, hypersomnia. Hepatic congestion, fatty liver/ Pancreatic insufficiency?  3)   neurocognitive testing was abnormal, mild neurocognitive disorder.   4)  Encourage hydration, exercises, supportive shoes. Water exercise.   I would like to thank  Prince Solian, [address] [place],  [place] [address] for allowing me to meet with and to take care of this pleasant patient.   In short, Donald [name] is presenting with slowly progressing memory loss.   I plan to follow up either personally or through our NP within 3 month and bring new CPAP with you!!!!. MOCA.    Electronically signed by: Larey Seat, MD 05/10/2021 4:04 PM  [facility] Board certified by The [employer] of Sleep Medicine and Diplomate of the [employer] of Sleep Medicine. Board certified In Neurology through the [place], Fellow of the [employer] of Neurology. Medical Director of [employer].

## 2021-05-11 ENCOUNTER — Ambulatory Visit (HOSPITAL_COMMUNITY)
Admission: RE | Admit: 2021-05-11 | Discharge: 2021-05-11 | Disposition: A | Discharge status: Home / Self Care | Payer: HMO | Source: Ambulatory Visit | Attending: Cardiovascular Disease | Admitting: Cardiovascular Disease

## 2021-05-11 ENCOUNTER — Other Ambulatory Visit: Payer: Self-pay

## 2021-05-11 ENCOUNTER — Other Ambulatory Visit: Payer: Self-pay | Admitting: Cardiology

## 2021-05-11 ENCOUNTER — Ambulatory Visit (HOSPITAL_COMMUNITY)
Admission: RE | Admit: 2021-05-11 | Discharge: 2021-05-11 | Disposition: A | Discharge status: Home / Self Care | Payer: HMO | Source: Ambulatory Visit | Attending: Cardiology | Admitting: Cardiology

## 2021-05-11 DIAGNOSIS — I25119 Atherosclerotic heart disease of native coronary artery with unspecified angina pectoris: Secondary | ICD-10-CM | POA: Insufficient documentation

## 2021-05-11 DIAGNOSIS — I2510 Atherosclerotic heart disease of native coronary artery without angina pectoris: Secondary | ICD-10-CM | POA: Diagnosis not present

## 2021-05-11 DIAGNOSIS — R072 Precordial pain: Secondary | ICD-10-CM

## 2021-05-11 DIAGNOSIS — R931 Abnormal findings on diagnostic imaging of heart and coronary circulation: Secondary | ICD-10-CM | POA: Diagnosis not present

## 2021-05-11 DIAGNOSIS — I5022 Chronic systolic (congestive) heart failure: Secondary | ICD-10-CM | POA: Insufficient documentation

## 2021-05-11 MED ORDER — METOPROLOL TARTRATE 5 MG/5ML IV SOLN
INTRAVENOUS | Status: AC
  Filled 2021-05-11: qty 10

## 2021-05-11 MED ORDER — NITROGLYCERIN 0.4 MG SL SUBL
0.8000 mg | SUBLINGUAL_TABLET | Freq: Once | SUBLINGUAL | Status: AC
  Administered 2021-05-11: 0.8 mg via SUBLINGUAL

## 2021-05-11 MED ORDER — IOHEXOL 350 MG/ML SOLN
95.0000 mL | Freq: Once | INTRAVENOUS | Status: AC | PRN
  Administered 2021-05-11: 95 mL via INTRAVENOUS

## 2021-05-11 MED ORDER — NITROGLYCERIN 0.4 MG SL SUBL
SUBLINGUAL_TABLET | SUBLINGUAL | Status: AC
  Filled 2021-05-11: qty 2

## 2021-05-11 MED ORDER — DILTIAZEM HCL 25 MG/5ML IV SOLN: 5.0000 mg | INTRAVENOUS | Status: DC | PRN

## 2021-05-11 MED ORDER — METOPROLOL TARTRATE 5 MG/5ML IV SOLN
5.0000 mg | INTRAVENOUS | Status: DC | PRN
  Administered 2021-05-11: 10 mg via INTRAVENOUS

## 2021-05-12 ENCOUNTER — Telehealth: Payer: Self-pay | Admitting: Cardiology

## 2021-05-12 DIAGNOSIS — E782 Mixed hyperlipidemia: Secondary | ICD-10-CM

## 2021-05-12 NOTE — Telephone Encounter (Signed)
Patient's wife Shauna Hugh is returning Jenna's call for his CT results.

## 2021-05-12 NOTE — Telephone Encounter (Signed)
Returned call to patients wife (okay per DPR) advised her of Dr. Newman Nickels review and recommendations of patients CCTA. Patients wife states that patient has been on a cholesterol medication for 20 years but is unsure of the name. Advised her that this was not seen on patients chart. Patient does have appointment tomorrow with PharmD and will bring in medications to compare to list on file to ensure the list is most up to date. Patient's wife would like blood work ordered- this has been placed and patient will have done tomorrow. Advised patient's wife to call back with any issues or concerns and she verbalized understanding of all instructions.    Donald Heinz, MD  05/12/2021  6:55 AM EST     Significantly elevated calcium score with moderate coronary stenosis.  CT FFR suggests no significant blockages.  Would plan to start statin.  Recommend checking fasting lipid panel and will plan to start statin based on results

## 2021-05-13 ENCOUNTER — Ambulatory Visit: Payer: HMO | Admitting: Pharmacist

## 2021-05-13 ENCOUNTER — Other Ambulatory Visit: Payer: Self-pay

## 2021-05-13 VITALS — BP 117/77 | HR 74 | Resp 17 | Ht 69.0 in | Wt 197.0 lb

## 2021-05-13 DIAGNOSIS — I5041 Acute combined systolic (congestive) and diastolic (congestive) heart failure: Secondary | ICD-10-CM

## 2021-05-13 DIAGNOSIS — E782 Mixed hyperlipidemia: Secondary | ICD-10-CM | POA: Diagnosis not present

## 2021-05-13 LAB — LIPID PANEL
Chol/HDL Ratio: 5.8 ratio — ABNORMAL HIGH (ref 0.0–5.0)
Cholesterol, Total: 233 mg/dL — ABNORMAL HIGH (ref 100–199)
HDL: 40 mg/dL (ref >39)
LDL Chol Calc (NIH): 124 mg/dL — ABNORMAL HIGH (ref 0–99)
Triglycerides: 386 mg/dL — ABNORMAL HIGH (ref 0–149)
VLDL Cholesterol Cal: 69 mg/dL — ABNORMAL HIGH (ref 5–40)

## 2021-05-13 NOTE — Patient Instructions (Addendum)
It was nice meeting you today  We would like your blood pressure to stay less than 130/80  Continue your:  Entresto 49-51mg  BID Spironolactone 25mg  daily Furosemide 20mg   Metoprolol 50mg  daily  Continue to weigh yourself daily.  Begin checking your blood pressure at home, about 2 hours after medications  Try to limit the amount of salt you are eating in your diet  We will see your back in 2-3 weeks  Karren Cobble, PharmD, [facility], [place], [place], [place], [address] Phone: [phone], Fax: [phone]

## 2021-05-13 NOTE — Telephone Encounter (Signed)
Saw patient this morning.  He and wife reported he is currently taking pravastatin 40mg  once daily

## 2021-05-13 NOTE — Progress Notes (Signed)
Patient ID: Donald [name]                 DOB: [birth year]                      MRN: [id number]     HPI: Donald [name] is a 71 y.o. male referred by Dr. Sallyanne Kuster to pharmacy clinic for HF medication management. PMH is significant for CHF, CP, DM, HTN, HLD, CKD, and memory loss. Most recent LVEF 35-40% on 03/22/21.  Patient presents today with wife in good spirits. Wife handles patient appointments and medications.  At last visit with Dr Sallyanne Kuster patient was started on spironolactone and Entresto.  Has lost >10# since his hospitalization but he does not believe he has lost any weight. Shows no signs/symptoms of swelling or edema.  Symptomatically, he reports occasional dizziness, lightheadedness and fatigue. He denies chest pain but reports heartburn.  Patient LDL elevated. Reports he has been on pravastatin 40mg  for many years (not on med list).  Current CHF meds:   Entresto 49-51mg  BID Spironolactone 25mg  daily Furosemide 20mg   Metoprolol 50mg  daily  BP goal: <130/80  Wt Readings from Last 3 Encounters:  05/10/21 193 lb 8 oz (87.8 kg)  04/22/21 198 lb (89.8 kg)  03/30/21 196 lb 9.6 oz (89.2 kg)   BP Readings from Last 3 Encounters:  05/11/21 109/68  05/10/21 119/74  04/22/21 110/72   Pulse Readings from Last 3 Encounters:  05/11/21 72  05/10/21 94  04/22/21 78    Renal function: Estimated Creatinine Clearance: 71.4 mL/min (by C-G formula based on SCr of 1.07 mg/dL).  Past Medical History:  Diagnosis Date   Allergic rhinitis, seasonal    Asthma    Bronchitis    Chronic kidney disease, stage I    collar bone    dislocation left side 05/2014   Degenerative joint disease of knee    bilateral   Diabetes mellitus    GERD (gastroesophageal reflux disease)    Heart murmur    history of   History of acute prostatitis    History of hematuria    Hyperlipidemia    Hypertension    IgA nephropathy    OSA on CPAP    Pneumonia    Sinusitis     Current Outpatient  Medications on File Prior to Visit  Medication Sig Dispense Refill   albuterol (PROVENTIL HFA;VENTOLIN HFA) 108 (90 BASE) MCG/ACT inhaler Inhale 2 puffs into the lungs every 6 (six) hours as needed for wheezing or shortness of breath. For shortness of breath     Ascorbic Acid (VITAMIN C PO) Take 1,000 mg by mouth daily.     Continuous Blood Gluc Sensor (FREESTYLE LIBRE 14 DAY SENSOR) MISC      Cyanocobalamin (VITAMIN B12) 500 MCG TABS Take 500 mcg by mouth daily.     DULoxetine (CYMBALTA) 60 MG capsule Take 60 mg by mouth daily.     finasteride (PROSCAR) 5 MG tablet Take 5 mg by mouth daily.     furosemide (LASIX) 20 MG tablet Take 20 mg by mouth daily in the afternoon.     HYDROcodone-acetaminophen (NORCO/VICODIN) 5-325 MG tablet Take one tablet by mouth every 6 hours for severe pain Dx: R07.51     Insulin Aspart FlexPen (NOVOLOG) 100 UNIT/ML Inject 40-60 Units into the skin as needed.     insulin degludec (TRESIBA FLEXTOUCH) 200 UNIT/ML FlexTouch Pen Inject 90 Units into the skin daily in the afternoon.  Insulin Pen Needle (UNIFINE PENTIPS PLUS) 32G X 4 MM MISC USE WITH TRESIBA DAILY     memantine (NAMENDA) 10 MG tablet Take 1 tablet (10 mg total) by mouth 2 (two) times daily. 60 tablet 11   metFORMIN (GLUCOPHAGE) 1000 MG tablet Take 1 tablet (1,000 mg total) by mouth 2 (two) times daily with a meal. Restart on 8/22     metoprolol succinate (TOPROL-XL) 50 MG 24 hr tablet Take 50 mg by mouth 2 (two) times daily. Take with or immediately following a meal.     Omega-3 Fatty Acids (FISH OIL) 1000 MG CPDR Take 2,000 Units by mouth daily.     pantoprazole (PROTONIX) 40 MG tablet Take 40 mg by mouth daily.     sacubitril-valsartan (ENTRESTO) 49-51 MG Take 1 tablet by mouth 2 (two) times daily. 60 tablet 11   silodosin (RAPAFLO) 8 MG CAPS capsule Take 8 mg by mouth daily.     spironolactone (ALDACTONE) 25 MG tablet Take 1 tablet (25 mg total) by mouth daily. 90 tablet 3   No current  facility-administered medications on file prior to visit.    Allergies  Allergen Reactions   Aricept [Donepezil Hcl] Nausea Only   Bactrim [Sulfamethoxazole-Trimethoprim] Hives   Ciprofloxacin     Tendons problem in shoulder   Levofloxacin Other (See Comments)    Muscle tighten up in left and right calf     Assessment/Plan:  1. CHF -  Patient BP today 117/77 which is at goal of <130/80.  Patient is tolerating medications well with no adverse effects.    Plan to titrate medications as tolerated while watching blood pressure and heart rate. Has been on Entresto for 2 weeks.  Will bring pack patient in 2 weeks for further titration. Consider starting Farxiga/Jardiance also. Has Jardiance "allergy" listed as anaphylaxis however comment says dehydration.  Check on in 2 weeks.  Recommended he being checking blood pressure and weight at home more frequently.  Blood sugar remains well controlled however lipids very elevated.  Will message Dr Gardiner Rhyme that patient is currently on pravastatin 40mg . Will likely need change to high intensity statin with consideration of PCSK9i.  Would also benefit from GLP-1a for cardiac protection.  May be able to reduce insulin dose at that time.    Continue: Entresto 49-51mg  BID Spironolactone 25mg  daily Furosemide 20mg   Metoprolol 50mg  daily Recheck in 2-3 weeks  Karren Cobble, PharmD, [place], [place], [place], [place], [place], [address] Phone: [phone], Fax: [phone]

## 2021-05-16 ENCOUNTER — Other Ambulatory Visit: Payer: Self-pay | Admitting: Student

## 2021-05-18 ENCOUNTER — Other Ambulatory Visit: Payer: Self-pay | Admitting: *Deleted

## 2021-05-18 MED ORDER — ROSUVASTATIN CALCIUM 20 MG PO TABS: 20.0000 mg | ORAL_TABLET | Freq: Every day | ORAL | 3 refills | Status: DC

## 2021-05-19 ENCOUNTER — Other Ambulatory Visit: Payer: Self-pay | Admitting: Student

## 2021-05-19 NOTE — Telephone Encounter (Signed)
Please see Lipid results. Will close this encounter.

## 2021-05-24 ENCOUNTER — Telehealth: Payer: Self-pay | Admitting: Cardiovascular Disease

## 2021-05-24 ENCOUNTER — Other Ambulatory Visit: Payer: Self-pay

## 2021-05-24 NOTE — Telephone Encounter (Signed)
°*  STAT* If patient is at the pharmacy, call can be transferred to refill team.   1. Which medications need to be refilled? (please list name of each medication and dose if known)  furosemide (LASIX) 20 MG tablet  2. Which pharmacy/location (including street and city if local pharmacy) is medication to be sent to? [facility] # [place], [place]  3. Do they need a 30 day or 90 day supply? 90 with refills   Patient is out of medication  Wife of patient said Dr. Verlee Monte (Pulmonology) used to write this rx. She wants to know if DR. C wants the patient to continue taking the meds if he would write a refill

## 2021-05-25 ENCOUNTER — Other Ambulatory Visit: Payer: Self-pay

## 2021-05-25 MED ORDER — FUROSEMIDE 20 MG PO TABS: 20.0000 mg | ORAL_TABLET | Freq: Every day | ORAL | 3 refills | Status: DC

## 2021-05-25 NOTE — Telephone Encounter (Signed)
Patient's wife has been made aware that Furosemide has been sent into [facility] per request.

## 2021-05-25 NOTE — Addendum Note (Signed)
Addended by: Ricci Barker on: 05/25/2021 10:49 AM   Modules accepted: Orders

## 2021-05-30 NOTE — Progress Notes (Signed)
Synopsis: Referred for acute cough by Prince Solian, MD  Subjective:   PATIENT ID: [name] GENDER: male DOB: 05-29-1950, MRN: [id number]  Chief Complaint  Patient presents with   Follow-up    Breathing is about the same since the last visit. He was coughing in the night last night- non prod.    71yM with history of UACS vs asthma followed in our clinic last seen 2016, GERD, sleep-disordered breathing on CPAP and O2, referred for acute cough.  Saw ENT at [facility] 2016, they felt cough/arytenoid edema caused by GERD, LPR.  He has had cough which he thinks is related to something in his throat. He has had neck surgery in past for herniated disks - 10 years ago. He also has elevated left collarbone related to prior injury and healing and still wonders if it's related to his cough. It is not productive. There is positional component to his cough where if he flexes his neck it's worse. He has no trouble swallowing. No real trouble with choking/aspirating on food/liquids.   He does think a prednisone course helped a little bit.  He is still taking protonix 40 mg once daily in AM right before breakfast.   Very frequent sensation of PND. Doesn't feel sinonasal congestion. Had deviated septum surgery in 1980s/90s.  He has mild DOE which he attributes to asthma - has not worsened since visit in 2016. Takes albuterol intermittently.   No family history of lung disease  He has secondhand smoke exposure.   He was a Dance movement psychotherapist, worked with Veterinary surgeon. 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 Had frequent dust/particulate exposure without mask back then (last in 1981). Has beagle at home. No pet birds. He has no hot tub. Lived in [place] for [place] time, [place].  Interval HPI: Doing better overall at this visit, less DOE. Cough improved, less prominent postnasal drainage currently.   Otherwise pertinent review of systems is negative.  Past Medical History:  Diagnosis  Date   Allergic rhinitis, seasonal    Asthma    Bronchitis    Chronic kidney disease, stage I    collar bone    dislocation left side 05/2014   Degenerative joint disease of knee    bilateral   Diabetes mellitus    GERD (gastroesophageal reflux disease)    Heart murmur    history of   History of acute prostatitis    History of hematuria    Hyperlipidemia    Hypertension    IgA nephropathy    OSA on CPAP    Pneumonia    Sinusitis      Family History  Problem Relation Age of Onset   Coronary artery disease Father    Diabetes type II Father    Heart attack Father      Past Surgical History:  Procedure Laterality Date   BIOPSY  05/17/2020   Procedure: BIOPSY;  Surgeon: Clarene Essex, MD;  Location: [facility];  Service: Endoscopy;;   CARDIAC CATHETERIZATION  09/29/2011   normal   CERVICAL SPINE SURGERY     COLONOSCOPY WITH PROPOFOL N/A 05/17/2020   Procedure: COLONOSCOPY WITH PROPOFOL;  Surgeon: Clarene Essex, MD;  Location: [facility];  Service: Endoscopy;  Laterality: N/A;   deviated septum  1930s   ESOPHAGOGASTRODUODENOSCOPY (EGD) WITH PROPOFOL N/A 05/17/2020   Procedure: ESOPHAGOGASTRODUODENOSCOPY (EGD) WITH PROPOFOL;  Surgeon: Clarene Essex, MD;  Location: [facility];  Service: Endoscopy;  Laterality: N/A;   HEMOSTASIS CLIP PLACEMENT  05/17/2020  Procedure: HEMOSTASIS CLIP PLACEMENT;  Surgeon: Vida Rigger, MD;  Location: [facility];  Service: Endoscopy;;   HERNIA REPAIR     LEFT HEART CATHETERIZATION WITH CORONARY ANGIOGRAM N/A 09/29/2011   Procedure: LEFT HEART CATHETERIZATION WITH CORONARY ANGIOGRAM;  Surgeon: Thurmon Fair, MD;  Location: [facility];  Service: Cardiovascular;  Laterality: N/A;   NM MYOVIEW LTD  07/08/2011   mild LV dilatation,mild septal hypokinesia   POLYPECTOMY  05/17/2020   Procedure: POLYPECTOMY;  Surgeon: Vida Rigger, MD;  Location: [facility];  Service: Endoscopy;;   TONSILLECTOMY     US ECHOCARDIOGRAPHY  09/07/2011   EF 35-45%,mod.ant  hypokinesis,boderline LA & RA enlargement,trace MR,TR & PI    Social History   Socioeconomic History   Marital status: Married    Spouse name: Diane   Number of children: 1   Years of education: 14   Highest education level: Not on file  Occupational History   Occupation: Teaching laboratory technician: Nurse, children's  Tobacco Use   Smoking status: Never   Smokeless tobacco: Never   Tobacco comments:    Second hand smoke for 26 years per pt  Vaping Use   Vaping Use: Never used  Substance and Sexual Activity   Alcohol use: No    Alcohol/week: 0.0 standard drinks   Drug use: No   Sexual activity: Not on file  Other Topics Concern   Not on file  Social History Narrative   Patient is married (Diane) and lives at home with his wife.   Patient has one child.   Patient is working full-time.   Patient has a Scientist, research (physical sciences).   Patient is left-handed.   Patient drinks one glass of tea daily, one soda daily.   Social Determinants of Health   Financial Resource Strain: Not on file  Food Insecurity: Not on file  Transportation Needs: Not on file  Physical Activity: Not on file  Stress: Not on file  Social Connections: Not on file  Intimate Partner Violence: Not on file     Allergies  Allergen Reactions   Ciprofloxacin     Tendons problem in shoulder Other reaction(s): Arthralgias (intolerance), Myalgias (intolerance)   Empagliflozin Anaphylaxis    Dehydration   Levofloxacin     Other reaction(s): Arthralgias (intolerance), Myalgias (intolerance)   Aricept [Donepezil Hcl] Nausea Only   Bactrim [Sulfamethoxazole-Trimethoprim] Hives   Levofloxacin Other (See Comments)    Muscle tighten up in left and right calf   Misc. Sulfonamide Containing Compounds Rash     Outpatient Medications Prior to Visit  Medication Sig Dispense Refill   albuterol (PROVENTIL HFA;VENTOLIN HFA) 108 (90 BASE) MCG/ACT inhaler Inhale 2 puffs into the lungs every 6 (six) hours as needed for  wheezing or shortness of breath. For shortness of breath     Ascorbic Acid (VITAMIN C PO) Take 1,000 mg by mouth daily.     Continuous Blood Gluc Sensor (FREESTYLE LIBRE 14 DAY SENSOR) MISC      Cyanocobalamin (VITAMIN B12) 500 MCG TABS Take 500 mcg by mouth daily.     DULoxetine (CYMBALTA) 60 MG capsule Take 60 mg by mouth daily.     finasteride (PROSCAR) 5 MG tablet Take 5 mg by mouth daily.     furosemide (LASIX) 20 MG tablet Take 1 tablet (20 mg total) by mouth daily in the afternoon. 90 tablet 3   HYDROcodone-acetaminophen (NORCO/VICODIN) 5-325 MG tablet Take one tablet by mouth every 6 hours for severe pain Dx: Y45.39  Insulin Aspart FlexPen (NOVOLOG) 100 UNIT/ML Inject 40-60 Units into the skin as needed.     insulin degludec (TRESIBA FLEXTOUCH) 200 UNIT/ML FlexTouch Pen Inject 90 Units into the skin daily in the afternoon.     Insulin Pen Needle (UNIFINE PENTIPS PLUS) 32G X 4 MM MISC USE WITH TRESIBA DAILY     memantine (NAMENDA) 10 MG tablet Take 1 tablet (10 mg total) by mouth 2 (two) times daily. 60 tablet 11   metFORMIN (GLUCOPHAGE) 1000 MG tablet Take 1 tablet (1,000 mg total) by mouth 2 (two) times daily with a meal. Restart on 8/22     metoprolol succinate (TOPROL-XL) 50 MG 24 hr tablet Take 50 mg by mouth 2 (two) times daily. Take with or immediately following a meal.     Omega-3 Fatty Acids (FISH OIL) 1000 MG CPDR Take 2,000 Units by mouth daily.     pantoprazole (PROTONIX) 40 MG tablet Take 40 mg by mouth daily.     rosuvastatin (CRESTOR) 20 MG tablet Take 1 tablet (20 mg total) by mouth daily. 90 tablet 3   sacubitril-valsartan (ENTRESTO) 49-51 MG Take 1 tablet by mouth 2 (two) times daily. 60 tablet 11   silodosin (RAPAFLO) 8 MG CAPS capsule Take 8 mg by mouth daily.     spironolactone (ALDACTONE) 25 MG tablet Take 1 tablet (25 mg total) by mouth daily. 90 tablet 3   No facility-administered medications prior to visit.       Objective:   Physical Exam:  General  appearance: 71 y.o., male, NAD, conversant  Eyes: anicteric sclerae; PERRL, tracking appropriately HENT: NCAT; MMM Neck: Trachea midline; no lymphadenopathy, no JVD Lungs: CTAB, no crackles, no wheeze, with normal respiratory effort CV: RRR, no murmur  Abdomen: Soft, non-tender; non-distended, BS present  Extremities: No peripheral edema, warm Skin: Normal turgor and texture; no rash Psych: Appropriate affect Neuro: Alert and oriented to person and place, no focal deficit     Vitals:   06/01/21 0914  BP: 100/62  Pulse: 71  Temp: 98 F (36.7 C)  TempSrc: Oral  SpO2: 96%  Weight: 199 lb (90.3 kg)  Height: $Remove'5\' 9"'dEbIozH$  (1.753 m)    96% on RA BMI Readings from Last 3 Encounters:  06/01/21 29.39 kg/m  05/13/21 29.09 kg/m  05/10/21 28.57 kg/m   Wt Readings from Last 3 Encounters:  06/01/21 199 lb (90.3 kg)  05/13/21 197 lb (89.4 kg)  05/10/21 193 lb 8 oz (87.8 kg)     CBC    Component Value Date/Time   WBC 10.5 03/27/2021 2320   RBC 5.28 03/27/2021 2320   HGB 13.1 03/27/2021 2320   HGB 13.4 10/18/2020 1033   HCT 42.4 03/27/2021 2320   HCT 42.5 10/18/2020 1033   PLT 290 03/27/2021 2320   PLT 309 10/18/2020 1033   MCV 80.3 03/27/2021 2320   MCV 82 10/18/2020 1033   MCH 24.8 (L) 03/27/2021 2320   MCHC 30.9 03/27/2021 2320   RDW 17.2 (H) 03/27/2021 2320   RDW 16.7 (H) 10/18/2020 1033   LYMPHSABS 1.9 10/18/2020 1033   MONOABS 0.7 12/25/2018 1500   EOSABS 0.4 10/18/2020 1033   BASOSABS 0.1 10/18/2020 1033    Low level eosinophilia historically Broad autoimmune panel, HP panel negative in 2016  Chest Imaging:   CXR 2020 reviewed by me and unremarkable  CT A/P 2020 reviewed by me, lung bases unremarkable  CT Chest 2016 reviewed by me and unremarkable  HRCT 02/14/21 reviewed by me with subtle air  trapping  Pulmonary Functions Testing Results: PFT Results Latest Ref Rng & Units 02/11/2021 06/17/2014  FVC-Pre L 2.81 3.30  FVC-Predicted Pre % 67 75  FVC-Post L  3.04 3.49  FVC-Predicted Post % 73 79  Pre FEV1/FVC % % 73 73  Post FEV1/FCV % % 76 77  FEV1-Pre L 2.05 2.42  FEV1-Predicted Pre % 66 73  FEV1-Post L 2.32 2.69  DLCO uncorrected ml/min/mmHg 19.80 21.18  DLCO UNC% % 80 71  DLCO corrected ml/min/mmHg 19.80 -  DLCO COR %Predicted % 80 -  DLVA Predicted % 100 94  TLC L 4.54 5.18  TLC % Predicted % 68 78  RV % Predicted % 78 83   Mild restriction without air trapping and with borderline bronchodilator response  CPET 07/2014: Stopped due to dyspnea and leg fatigue MVV 88 (67% pred) pre exercise Peak vo2 just under normal for ibw (84% ibw pred) HRR 7 bpm  Peak HR 151 Peak RR 38 Peak V: 69.5 BR of 18.5 Ve/MVV: 79%   Echocardiogram:   TTE 09/2011 with EF 35-45%      Assessment & Plan:   # Acute on chronic cough: # Mild restrictive ventilatory defect, borderline BD response: # GERD/LPR Etiology of cough may be multifactorial - regardless he's doing better at this visit. Consider UACS/PND which sounds prominent currently, LPR/GERD, less likely longstanding asthma (does have borderline BD response). Restriction may be due to combination of habitus and volume overload at time of PFT.  Plan: - flonase for at least a few weeks when has postnasal drainage - keep taking protonix everyday in morning 30 minutes before eating or 30 min before dinner in evening - happy to see him again if bothered by cough despite these measures for consideration of therapeutic trial of ICS/LABA given less likely possibility of asthma (essentially +BD response on PFT)    RTC prn  Maryjane Hurter, MD [facility] 06/01/2021 9:29 AM

## 2021-06-01 ENCOUNTER — Other Ambulatory Visit: Payer: Self-pay

## 2021-06-01 ENCOUNTER — Encounter: Payer: Self-pay | Admitting: Student

## 2021-06-01 ENCOUNTER — Ambulatory Visit: Payer: HMO | Admitting: Student

## 2021-06-01 VITALS — BP 100/62 | HR 71 | Temp 98.0°F | Ht 69.0 in | Wt 199.0 lb

## 2021-06-01 DIAGNOSIS — R0609 Other forms of dyspnea: Secondary | ICD-10-CM | POA: Diagnosis not present

## 2021-06-01 DIAGNOSIS — R053 Chronic cough: Secondary | ICD-10-CM | POA: Diagnosis not present

## 2021-06-01 NOTE — Patient Instructions (Signed)
-   Would make sure to take protonix 30 minutes before either breakfast or dinner - keep up the good work trying to lose weight - flonase for a few weeks whenever you have a bout of increased post-nasal drainage - if significantly bothered by cough then I'm happy to see if we can find you an affordable controller inhaler to try (ICS/LABA - inhaled corticosteroid/long acting beta agonist)

## 2021-06-03 ENCOUNTER — Other Ambulatory Visit: Payer: Self-pay

## 2021-06-03 ENCOUNTER — Ambulatory Visit: Payer: HMO | Admitting: Pharmacist

## 2021-06-03 VITALS — BP 121/78 | HR 82 | Wt 202.8 lb

## 2021-06-03 DIAGNOSIS — I5020 Unspecified systolic (congestive) heart failure: Secondary | ICD-10-CM

## 2021-06-03 DIAGNOSIS — I429 Cardiomyopathy, unspecified: Secondary | ICD-10-CM | POA: Diagnosis not present

## 2021-06-03 NOTE — Progress Notes (Signed)
Patient ID: Donald [name]                 DOB: 14-Jan-1951                      MRN: [id number]     HPI: Donald [name] is a 71 y.o. male referred by Dr. Sallyanne Kuster to pharmacy clinic for HF medication management. PMH is significant for CHF, T2DM, HLD, HTN, and memory defects.  Wife typically handles medications and appointments. Most recent LVEF 35-40% on 03/22/21.  Today he returns to pharmacy clinic for further medication titration with his wife.  Has begun checking BP at home occasionally and readings are consistently <130/80.  He reports however that his blood sugar has been elevated since starting Entresto.  Wears a blood glucose sensor on his leg (not his arm because he says it falls off) and his readings have been higher since Thanksgiving.  Symptomatically, he is feeling the same as last visit, however patient is a poor historian.  Denies dizziness or lightheadedness.  Has daytime fatigue and typically takes a nap around 2pm in the afternoon but wife believes this is because he does not do anything during the day.  Reports he does not feel any different since starting Entresto.  Denies chest pain or palpitations.  Feels SOB when walking short distances.  Exercises on Tuesdays and Thursdays and does weight lifting. Knows he is not supposed to lift weights strenuously that would make him strain or hold his breath however it is unclear if he is doing this.    Has not been checking weight at home but reports no swelling or edema.  Went three days without furosemide and did not notice any change.    Patient has tried Jardiance in the past but it was discontinued due to frequent urinary tract infections. Reports that his bladder is colonized with bacteria so he is prone to UTIs  BP readings at home: 1/24: 110/75, 89 1/24: 114/72, 77 1/21: 118/79, 71 1/18: 127/76, 68 1/17: 106/72, 86   Current CHF meds: Furosemide 20mg  daily  Entresto 49-51mg  BID Spironolactone 25mg   daily Metoprolol 50mg  daily   BP goal: <130/80  Wt Readings from Last 3 Encounters:  06/01/21 199 lb (90.3 kg)  05/13/21 197 lb (89.4 kg)  05/10/21 193 lb 8 oz (87.8 kg)   BP Readings from Last 3 Encounters:  06/01/21 100/62  05/13/21 117/77  05/11/21 109/68   Pulse Readings from Last 3 Encounters:  06/01/21 71  05/13/21 74  05/11/21 72    Renal function: CrCl cannot be calculated (Patient's most recent lab result is older than the maximum 21 days allowed.).  Past Medical History:  Diagnosis Date   Allergic rhinitis, seasonal    Asthma    Bronchitis    Chronic kidney disease, stage I    collar bone    dislocation left side 05/2014   Degenerative joint disease of knee    bilateral   Diabetes mellitus    GERD (gastroesophageal reflux disease)    Heart murmur    history of   History of acute prostatitis    History of hematuria    Hyperlipidemia    Hypertension    IgA nephropathy    OSA on CPAP    Pneumonia    Sinusitis     Current Outpatient Medications on File Prior to Visit  Medication Sig Dispense Refill   albuterol (PROVENTIL HFA;VENTOLIN HFA) 108 (90 BASE) MCG/ACT inhaler Inhale  2 puffs into the lungs every 6 (six) hours as needed for wheezing or shortness of breath. For shortness of breath     Ascorbic Acid (VITAMIN C PO) Take 1,000 mg by mouth daily.     Continuous Blood Gluc Sensor (FREESTYLE LIBRE 14 DAY SENSOR) MISC      Cyanocobalamin (VITAMIN B12) 500 MCG TABS Take 500 mcg by mouth daily.     DULoxetine (CYMBALTA) 60 MG capsule Take 60 mg by mouth daily.     finasteride (PROSCAR) 5 MG tablet Take 5 mg by mouth daily.     furosemide (LASIX) 20 MG tablet Take 1 tablet (20 mg total) by mouth daily in the afternoon. 90 tablet 3   HYDROcodone-acetaminophen (NORCO/VICODIN) 5-325 MG tablet Take one tablet by mouth every 6 hours for severe pain Dx: R07.51     Insulin Aspart FlexPen (NOVOLOG) 100 UNIT/ML Inject 40-60 Units into the skin as needed.     insulin  degludec (TRESIBA FLEXTOUCH) 200 UNIT/ML FlexTouch Pen Inject 90 Units into the skin daily in the afternoon.     Insulin Pen Needle (UNIFINE PENTIPS PLUS) 32G X 4 MM MISC USE WITH TRESIBA DAILY     memantine (NAMENDA) 10 MG tablet Take 1 tablet (10 mg total) by mouth 2 (two) times daily. 60 tablet 11   metFORMIN (GLUCOPHAGE) 1000 MG tablet Take 1 tablet (1,000 mg total) by mouth 2 (two) times daily with a meal. Restart on 8/22     metoprolol succinate (TOPROL-XL) 50 MG 24 hr tablet Take 50 mg by mouth 2 (two) times daily. Take with or immediately following a meal.     Omega-3 Fatty Acids (FISH OIL) 1000 MG CPDR Take 2,000 Units by mouth daily.     pantoprazole (PROTONIX) 40 MG tablet Take 40 mg by mouth daily.     rosuvastatin (CRESTOR) 20 MG tablet Take 1 tablet (20 mg total) by mouth daily. 90 tablet 3   sacubitril-valsartan (ENTRESTO) 49-51 MG Take 1 tablet by mouth 2 (two) times daily. 60 tablet 11   silodosin (RAPAFLO) 8 MG CAPS capsule Take 8 mg by mouth daily.     spironolactone (ALDACTONE) 25 MG tablet Take 1 tablet (25 mg total) by mouth daily. 90 tablet 3   No current facility-administered medications on file prior to visit.    Allergies  Allergen Reactions   Ciprofloxacin     Tendons problem in shoulder Other reaction(s): Arthralgias (intolerance), Myalgias (intolerance)   Empagliflozin Anaphylaxis    Dehydration   Levofloxacin     Other reaction(s): Arthralgias (intolerance), Myalgias (intolerance)   Aricept [Donepezil Hcl] Nausea Only   Bactrim [Sulfamethoxazole-Trimethoprim] Hives   Levofloxacin Other (See Comments)    Muscle tighten up in left and right calf   Misc. Sulfonamide Containing Compounds Rash     Assessment/Plan:  1. CHF -  Patient BP in room today 121/78 which is at goal of <130/80.  Patient home readings are at goal also.  Does not report he feels any different since starting Entresto and spironolactone. He demonstrates a poor memory however and  disorganized in thought and conversation.  Patient is not a great candidate for Jardiance/Farxiga at this time due to current hyperglycemia and history of UTIs.    Since patient has begun checking BP at home, will increase Entresto to max dose.  Reports he recently refilled his 49-51mg  strength tablets. Instructed patient to begin taking 2 tablets BID and continue to monitor BP and how he feels.  Wife concerned regarding  possible hypotension and explained will reduce back to 49-51mg  if he has symptomatic hypotension.  Patient and wife voiced understanding.  Advised that since patient's weight has been steady and is not showing any signs/symptoms of edema, can change furosemide to PRN due to recent elevated BUN and Scr.   Continue: Spironolactone 25mg  daily Metoprolol 50mg  daily Change furosemide to 20mg  PRN Increase Entresto to 49-51mg  (2 tabs) BID  Recheck in 4 weeks  Karren Cobble, PharmD, [place], [place], [place], [place], [place], [address] Phone: [phone], Fax: [phone]

## 2021-06-03 NOTE — Patient Instructions (Addendum)
It was nice seeing you again  We would like to keep your blood pressure less than 130/80  Continue your Metoprolol 50mg  once a day And Spironolactone 25mg  daily  Increase your Entresto at this point to 2 tablets twice a day  Only take your furosemide as needed based on fluid build up or weight gain  Continue your Tuesday and Thursday and exercise routine  Keep watching your diet, blood pressure and weight  Karren Cobble, PharmD, BCACP, [place], [place], [place], [place], [address] Phone: [phone], Fax: [phone]

## 2021-06-04 DIAGNOSIS — G4733 Obstructive sleep apnea (adult) (pediatric): Secondary | ICD-10-CM | POA: Diagnosis not present

## 2021-06-10 DIAGNOSIS — G4733 Obstructive sleep apnea (adult) (pediatric): Secondary | ICD-10-CM | POA: Diagnosis not present

## 2021-06-13 ENCOUNTER — Telehealth: Payer: Self-pay

## 2021-06-13 ENCOUNTER — Telehealth: Payer: Self-pay | Admitting: Cardiovascular Disease

## 2021-06-13 DIAGNOSIS — I10 Essential (primary) hypertension: Secondary | ICD-10-CM

## 2021-06-13 DIAGNOSIS — I509 Heart failure, unspecified: Secondary | ICD-10-CM

## 2021-06-13 DIAGNOSIS — I5020 Unspecified systolic (congestive) heart failure: Secondary | ICD-10-CM

## 2021-06-13 NOTE — Telephone Encounter (Signed)
Lmom for pt to complete labs

## 2021-06-13 NOTE — Telephone Encounter (Signed)
-----   Message from Rollen Sox, [address] [facility] sent at 06/03/2021 12:14 PM EST ----- Please call patient or wife and ask them to come in for BMP since increasing Entresto

## 2021-06-13 NOTE — Telephone Encounter (Signed)
°*  STAT* If patient is at the pharmacy, call can be transferred to refill team.   1. Which medications need to be refilled? (please list name of each medication and dose if known)  sacubitril-valsartan (ENTRESTO) 49-51 MG  2. Which pharmacy/location (including street and city if local pharmacy) is medication to be sent to?  [facility] # [place], [place]  3. Do they need a 30 day or 90 day supply?  90 day supply   Patient ran out of medication today, due to Karren Cobble increasing medication to two tablets twice daily. Requesting new prescription with increased dosage be written and sent to the pharmacy.

## 2021-06-14 MED ORDER — ENTRESTO 97-103 MG PO TABS: 1.0000 | ORAL_TABLET | Freq: Two times a day (BID) | ORAL | 1 refills | Status: DC

## 2021-06-17 DIAGNOSIS — R413 Other amnesia: Secondary | ICD-10-CM | POA: Diagnosis not present

## 2021-06-17 DIAGNOSIS — E118 Type 2 diabetes mellitus with unspecified complications: Secondary | ICD-10-CM | POA: Diagnosis not present

## 2021-06-17 DIAGNOSIS — N182 Chronic kidney disease, stage 2 (mild): Secondary | ICD-10-CM | POA: Diagnosis not present

## 2021-06-17 DIAGNOSIS — F39 Unspecified mood [affective] disorder: Secondary | ICD-10-CM | POA: Diagnosis not present

## 2021-06-17 DIAGNOSIS — I5042 Chronic combined systolic (congestive) and diastolic (congestive) heart failure: Secondary | ICD-10-CM | POA: Diagnosis not present

## 2021-06-17 DIAGNOSIS — E785 Hyperlipidemia, unspecified: Secondary | ICD-10-CM | POA: Diagnosis not present

## 2021-06-17 DIAGNOSIS — R0902 Hypoxemia: Secondary | ICD-10-CM | POA: Diagnosis not present

## 2021-06-17 DIAGNOSIS — G8929 Other chronic pain: Secondary | ICD-10-CM | POA: Diagnosis not present

## 2021-06-17 DIAGNOSIS — I129 Hypertensive chronic kidney disease with stage 1 through stage 4 chronic kidney disease, or unspecified chronic kidney disease: Secondary | ICD-10-CM | POA: Diagnosis not present

## 2021-06-17 DIAGNOSIS — R1011 Right upper quadrant pain: Secondary | ICD-10-CM | POA: Diagnosis not present

## 2021-06-17 DIAGNOSIS — Z7189 Other specified counseling: Secondary | ICD-10-CM | POA: Diagnosis not present

## 2021-06-17 DIAGNOSIS — M170 Bilateral primary osteoarthritis of knee: Secondary | ICD-10-CM | POA: Diagnosis not present

## 2021-06-21 ENCOUNTER — Other Ambulatory Visit: Payer: Self-pay | Admitting: Internal Medicine

## 2021-06-21 DIAGNOSIS — R1011 Right upper quadrant pain: Secondary | ICD-10-CM

## 2021-06-24 ENCOUNTER — Ambulatory Visit
Admission: RE | Admit: 2021-06-24 | Discharge: 2021-06-24 | Disposition: A | Discharge status: Home / Self Care | Payer: HMO | Source: Ambulatory Visit | Attending: Internal Medicine | Admitting: Internal Medicine

## 2021-06-24 ENCOUNTER — Other Ambulatory Visit: Payer: Self-pay

## 2021-06-24 DIAGNOSIS — R1011 Right upper quadrant pain: Secondary | ICD-10-CM | POA: Diagnosis not present

## 2021-06-24 DIAGNOSIS — K760 Fatty (change of) liver, not elsewhere classified: Secondary | ICD-10-CM | POA: Diagnosis not present

## 2021-07-05 DIAGNOSIS — G4733 Obstructive sleep apnea (adult) (pediatric): Secondary | ICD-10-CM | POA: Diagnosis not present

## 2021-07-06 DIAGNOSIS — I509 Heart failure, unspecified: Secondary | ICD-10-CM | POA: Diagnosis not present

## 2021-07-06 DIAGNOSIS — I10 Essential (primary) hypertension: Secondary | ICD-10-CM | POA: Diagnosis not present

## 2021-07-07 LAB — BASIC METABOLIC PANEL
BUN/Creatinine Ratio: 35 — ABNORMAL HIGH (ref 10–24)
BUN: 45 mg/dL — ABNORMAL HIGH (ref 8–27)
CO2: 25 mmol/L (ref 20–29)
Calcium: 10.1 mg/dL (ref 8.6–10.2)
Chloride: 99 mmol/L (ref 96–106)
Creatinine, Ser: 1.28 mg/dL — ABNORMAL HIGH (ref 0.76–1.27)
Glucose: 89 mg/dL (ref 70–99)
Potassium: 4.7 mmol/L (ref 3.5–5.2)
Sodium: 138 mmol/L (ref 134–144)
eGFR: 61 mL/min/{1.73_m2} (ref >59)

## 2021-07-08 ENCOUNTER — Encounter: Payer: Self-pay | Admitting: Pharmacist

## 2021-07-08 ENCOUNTER — Other Ambulatory Visit: Payer: Self-pay

## 2021-07-08 ENCOUNTER — Ambulatory Visit: Payer: HMO | Admitting: Pharmacist

## 2021-07-08 VITALS — BP 106/70 | HR 65 | Wt 198.2 lb

## 2021-07-08 DIAGNOSIS — I5041 Acute combined systolic (congestive) and diastolic (congestive) heart failure: Secondary | ICD-10-CM

## 2021-07-08 DIAGNOSIS — D131 Benign neoplasm of stomach: Secondary | ICD-10-CM | POA: Insufficient documentation

## 2021-07-08 DIAGNOSIS — K5730 Diverticulosis of large intestine without perforation or abscess without bleeding: Secondary | ICD-10-CM | POA: Insufficient documentation

## 2021-07-08 DIAGNOSIS — K317 Polyp of stomach and duodenum: Secondary | ICD-10-CM | POA: Insufficient documentation

## 2021-07-08 DIAGNOSIS — Z860100 Personal history of colon polyps, unspecified: Secondary | ICD-10-CM | POA: Insufficient documentation

## 2021-07-08 NOTE — Patient Instructions (Addendum)
It was good seeing you two again ? ?We would like your blood pressure to stay less than 130/80 ? ?Continue your: ? ?Entresto 97-103 twice a day ?Spironolactone 25mg  daily ?Metoprolol 50mg  daily ? ?Reduce your furosemide to 10mg  ? ?Continue to check your blood pressure, weight, and blood pressure at home ? ?Please call with any questions ? ?Recheck with Dr Sallyanne Kuster in 1 month ? ?Karren Cobble, PharmD, BCACP, [place], CPP ?[address], [address] ?[place], [place], [address] ?Phone: [phone], Fax: [phone]  ? ?

## 2021-07-08 NOTE — Progress Notes (Signed)
Patient ID: Donald [name]                 DOB: Sep 22, 1950                      MRN: [id number] ? ? ? ? ?HPI: ?Donald [name] is a 71 y.o. male referred by Dr. Sallyanne Kuster to pharmacy clinic for HF medication management. PMH is significant for CHF, T2DM, HLD, HTN, memory defects.. Most recent LVEF 35-40% on 03/22/21.  At last visit Entresto was titrated to max dose and it was recommended to change furosemide to PRN due to renal function. ? ?Patient and wife today present in good spirits. Patient reports he feels well. Is tolerating increased dose of entresto without issues.  Noticed he had a few pound weight gain in the past month so he restarted furosemide 20mg  daily. Did not see any swelling however . ? ?Home BP readings (does not check frequently) ? ?3/2: 124/74, 68 ?3/2: 130/75, 72 ? ?2/16: 102/72, 77 ?2/16: 118/76, 77 ? ?1/31: 102/68, 85 ?1/31: 101/65, 72 ? ?1/30: 115/69, 88 ?1/30: 113/78, 87 ?1/30: 128/80, 78 ? ?Showed blood sugar readings from  past month and levels are much better controlled.  Continues to exercise on Tuesdays and Thursdays.  ? ?Current CHF meds:  ?Furosemide 20mg  PRN ?Entresto 79-103 BID ?Spironolactone 25mg  daily ?Metoprolol 50mg  daily ? ?BP goal: <130/80 ? ?Wt Readings from Last 3 Encounters:  ?06/03/21 202 lb 12.8 oz (92 kg)  ?06/01/21 199 lb (90.3 kg)  ?05/13/21 197 lb (89.4 kg)  ? ?BP Readings from Last 3 Encounters:  ?06/03/21 121/78  ?06/01/21 100/62  ?05/13/21 117/77  ? ?Pulse Readings from Last 3 Encounters:  ?06/03/21 82  ?06/01/21 71  ?05/13/21 74  ? ? ?Renal function: ?CrCl cannot be calculated (Unknown ideal weight.). ? ?Past Medical History:  ?Diagnosis Date  ? Allergic rhinitis, seasonal   ? Asthma   ? Bronchitis   ? Chronic kidney disease, stage I   ? collar bone   ? dislocation left side 05/2014  ? 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 Diabetes mellitus   ? GERD (gastroesophageal reflux disease)   ? Heart murmur   ? history of  ? History of acute prostatitis    ? History of hematuria   ? Hyperlipidemia   ? Hypertension   ? IgA nephropathy   ? OSA on CPAP   ? Pneumonia   ? Sinusitis   ? ? ?Current Outpatient Medications on File Prior to Visit  ?Medication Sig Dispense Refill  ? albuterol (PROVENTIL HFA;VENTOLIN HFA) 108 (90 BASE) MCG/ACT inhaler Inhale 2 puffs into the lungs every 6 (six) hours as needed for wheezing or shortness of breath. For shortness of breath    ? Ascorbic Acid (VITAMIN C PO) Take 1,000 mg by mouth daily.    ? Continuous Blood Gluc Sensor (FREESTYLE LIBRE 14 DAY SENSOR) MISC     ? Cyanocobalamin (VITAMIN B12) 500 MCG TABS Take 500 mcg by mouth daily.    ? DULoxetine (CYMBALTA) 60 MG capsule Take 60 mg by mouth daily.    ? finasteride (PROSCAR) 5 MG tablet Take 5 mg by mouth daily.    ? furosemide (LASIX) 20 MG tablet Take 1 tablet (20 mg total) by mouth daily in the afternoon. 90 tablet 3  ? HYDROcodone-acetaminophen (NORCO/VICODIN) 5-325 MG tablet Take one tablet by mouth every 6 hours for severe pain Dx: R07.51    ?  Insulin Aspart FlexPen (NOVOLOG) 100 UNIT/ML Inject 40-60 Units into the skin as needed.    ? insulin degludec (TRESIBA FLEXTOUCH) 200 UNIT/ML FlexTouch Pen Inject 90 Units into the skin daily in the afternoon.    ? Insulin Pen Needle (UNIFINE PENTIPS PLUS) 32G X 4 MM MISC USE WITH TRESIBA DAILY    ? memantine (NAMENDA) 10 MG tablet Take 1 tablet (10 mg total) by mouth 2 (two) times daily. 60 tablet 11  ? metFORMIN (GLUCOPHAGE) 1000 MG tablet Take 1 tablet (1,000 mg total) by mouth 2 (two) times daily with a meal. Restart on 8/22    ? metoprolol succinate (TOPROL-XL) 50 MG 24 hr tablet Take 50 mg by mouth 2 (two) times daily. Take with or immediately following a meal.    ? Omega-3 Fatty Acids (FISH OIL) 1000 MG CPDR Take 2,000 Units by mouth daily.    ? pantoprazole (PROTONIX) 40 MG tablet Take 40 mg by mouth daily.    ? rosuvastatin (CRESTOR) 20 MG tablet Take 1 tablet (20 mg total) by mouth daily. 90 tablet 3  ? sacubitril-valsartan  (ENTRESTO) 97-103 MG Take 1 tablet by mouth 2 (two) times daily. 180 tablet 1  ? silodosin (RAPAFLO) 8 MG CAPS capsule Take 8 mg by mouth daily.    ? spironolactone (ALDACTONE) 25 MG tablet Take 1 tablet (25 mg total) by mouth daily. 90 tablet 3  ? ?No current facility-administered medications on file prior to visit.  ? ? ?Allergies  ?Allergen Reactions  ? Ciprofloxacin   ?  Tendons problem in shoulder ?Other reaction(s): Arthralgias (intolerance), Myalgias (intolerance)  ? Empagliflozin Anaphylaxis  ?  Dehydration  ? Levofloxacin   ?  Other reaction(s): Arthralgias (intolerance), Myalgias (intolerance)  ? Aricept [Donepezil Hcl] Nausea Only  ? Bactrim [Sulfamethoxazole-Trimethoprim] Hives  ? Levofloxacin Other (See Comments)  ?  Muscle tighten up in left and right calf  ? Misc. Sulfonamide Containing Compounds Rash  ? ? ? ?Assessment/Plan: ? ?1. CHF -  BP in room today 106/70 which is at goal of <130/80.  Discussed recent BMP with patient. Continues to have elevated BUN and Scr.  Advised again to only take furosemide PRN due to renal function.  Patinet voiced understanding.  Can not add Farxiga/Jardiance due to history of UTIs therefore no med changes needed at this time.  Recommended to continue checking BP, weight, and blood sugar at home. ? ?Continue: ?Furosemide 20mg  PRN ?Entresto 79-103 BID ?Spironolactone 25mg  daily ?Metoprolol 50mg  daily ?Recheck with Dr Sallyanne Kuster in 4 weeks ? ?Karren Cobble, PharmD, BCACP, [place], CPP ?[address], [address] ?[place], [place], [address] ?Phone: [phone], Fax: [phone]  ?

## 2021-07-19 ENCOUNTER — Encounter: Payer: Self-pay | Admitting: Neurology

## 2021-07-19 ENCOUNTER — Ambulatory Visit: Payer: HMO | Admitting: Neurology

## 2021-07-19 VITALS — BP 108/72 | HR 85 | Ht 69.0 in | Wt 202.4 lb

## 2021-07-19 DIAGNOSIS — I429 Cardiomyopathy, unspecified: Secondary | ICD-10-CM | POA: Diagnosis not present

## 2021-07-19 DIAGNOSIS — G4730 Sleep apnea, unspecified: Secondary | ICD-10-CM

## 2021-07-19 DIAGNOSIS — G4710 Hypersomnia, unspecified: Secondary | ICD-10-CM | POA: Diagnosis not present

## 2021-07-19 DIAGNOSIS — G4733 Obstructive sleep apnea (adult) (pediatric): Secondary | ICD-10-CM | POA: Diagnosis not present

## 2021-07-19 DIAGNOSIS — Z9989 Dependence on other enabling machines and devices: Secondary | ICD-10-CM | POA: Diagnosis not present

## 2021-07-19 DIAGNOSIS — G3184 Mild cognitive impairment, so stated: Secondary | ICD-10-CM | POA: Diagnosis not present

## 2021-07-19 DIAGNOSIS — I5020 Unspecified systolic (congestive) heart failure: Secondary | ICD-10-CM

## 2021-07-19 NOTE — Progress Notes (Addendum)
S[facility]EP [facility]    Provider:  Melvyn Novas, MD  Primary Care Physician:  Chilton Greathouse, MD [address] [place] [address]     Referring Provider: Chilton Greathouse, Md [address] [place],  [place] [address]          Chief Complaint according to patient   Patient presents with:     New Patient (Initial Visit)     Follow -up evaluation of memory loss in established Sleep patient.  Confusional episodes, forgetting short term , forgetting numbers, executive failures.  DME [facility] ([facility]) .  they have held -off starting the namenda.  . Here to f/u for memory. Pt reports memory has been stable, no changes. Pt has been tolerating Memantine with no SE. Pt started CPAP last week and feels like pressure is a little to high. MOCA 26.high degree of sleepiness, long sleep time and high fatigue.  CHF systolic 35-40 % EF.  Also Renal dysfunction. Could he have liver failure or dysfunction as well. He was never an alcohol abuser and not a smoker. Marland Kitchen      HISTORY OF PRESENT ILLNESS:  Donald [name] is a 71 y.o. year old Caucasian male patient seen here in a RV on 07/19/2021.  The patient underwent a sleep study by home sleep test and was diagnosed with severe sleep apnea.  He had an AHI of 37.9/h there were over 270 minutes of sleep and hypoxia there were also evidence of loud snoring and that the patient had the highest AHI when sleeping on his right side.  He was therefore given an auto titration capable CPAP device with a setting from 6-18 cmH2O 3 cm EPR and we discussed that oxygen may have to be bled into his device.  The patient has used his auto titration device compliantly.  He is using a minimum pressure of 7 and a maximum pressure of 10 cm water and over the last 30 days had 100% compliance.  All of these nights over 4 hours of use.  Average pressure at 95th percentile 8.2 cmH2O which is provided average AHI is 0.8/h which is an excellent resolution of  apnea.  The patient's home sleep test was performed in May 2022 is almost 71 years old and his CPAP seems to serve him well in terms of his apnea control.  His sleepiness is controlled but he needs oxygen to sleep well.   Cognitive impairment : the patient d/c aricept due to nausea.   He will follow with Dr. Phillips Odor  soon for his reduced EF.      05-10-2021: Still reports headaches in cycles. New diagnoses of CHF, swelling to the legs , abdominal distention. Dr Phillips Odor stated left ventricular dysfunction. No more alcohol. Fatty liver. Renal dysfunction, IGA nephropathy. Pulmonologist ordered a test for cardiac function, echo-   Here to f/u for memory. Pt reports memory has been stable, no changes. Pt has been tolerating Memantine with no SE.  Pt started new CPAP just last week and feels like pressure is a little to high. MOCA score 27/ 30 points. Still reports headaches in cycles. History of hypoxia and sleep apnea, AHI 37/ h by HST on 10-03-2018.   Echo report and neurocognitive repot reviewed and MRI/PET scan reviewed.   IMPRESSIONS Left ventricular ejection fraction, by estimation, is 35 to 40%. The left ventricle has moderately decreased function. The left ventricle demonstrates global hypokinesis. Left ventricular diastolic parameters are consistent with Grade I diastolic dysfunction (  impaired relaxation). 1. Right ventricular systolic function is low normal. The right ventricular size is normal. There is normal pulmonary artery systolic pressure. The estimated right ventricular systolic pressure is 63.8 mmHg. 2. 3. The mitral valve is grossly normal. Trivial mitral valve regurgitation. 4. The aortic valve is tricuspid. Aortic valve regurgitation is not visualized. The inferior vena cava is normal in size with greater than 50% respiratory variability, suggesting right atrial pressure of 3 mmHg. 5. Comparison(s): Prior images unable to be directly viewed, comparison made by report  only. Changes from prior study are noted. 09/06/2011: LVEF 35-45%. FINDINGS Left Ventricle: Left ventricular ejection fraction, by estimation, is 35 to 40%. The left ventricle has moderately decreased function. The left ventricle demonstrates global hypokinesis. The left ventricular internal cavity size was normal in size. There is no left ventricular hypertrophy. Left ventricular diastolic parameters are consistent with Grade I diastolic dysfunction (impaired relaxation). Indeterminate filling pressures.  06/ 13/ 2022.   Donald [name] has had to stop the Aricept due to nausea and increased headaches.  After he stopped the Aricept nausea Better he still has headaches but normal 6 months.  Headaches have been responding to ibuprofen but he was warned not to take ibuprofen due to kidney disease he feels that alternating with Tylenol has not had the desired effect,.  For a urinary tract infection at the end of May he was treated with antibiotics that he did not tolerate.  He was given a prednisone Dosepak which made his otherwise more likely inflammatory symptoms disappear and he felt much better, less in pain and also mentally clear and more relaxed.   Since this steroid Dosepak is now completed as he has seen a repeat return of some of his aches and pains.  Neuropsychology reports for this patients, encourage exercise, retesting need for oxygen ( he is on 02 )  in a sleep study and SPECT scan order. Impulse control is lower than expected, pretty much every cognitive domain was average or even above average except over 20 minute recall and color naming.  He is not seen by pulmonology for oxygen need- small vessel disease of the brain may be related.  BRAIN MRI was unchanged -05-15-2020 Dr. Felecia Shelling, small vessel disease.   Donald [name] has been a compliant CPAP user at 97%, these are data from November through December 2021 however I am not quite sure what happened to the more recent data. Residual AHI is 2.5  how can I link my epic -integrated dragon to my NUANCE PRO handheld microphone? Donald [name], M.D. 95th percentile air leak is 7.7 L, 95th percentile pressure is 9.5 cmH2O.  All remaining or residual apneas were obstructive.  And with a minimum pressure of 7 maximum pressure of 10 and 2 cm EPR -there needs to be no reset.   04-15-2020: Donald [name] is a 71 y.o. male , seen here as in a referral from [name] for a new evaluation of apnea. Patient is a highly compliant CPAP user since receiving a diagnosis of mild OSA and severe Hypoxemia in 2015. Here today not only to follow up for sleep, but with a concern about memory decline.  Donald [name] is here today with his spouse both voiced a concern about his short-term memory function, and he often has to look at instructions calendars for days again and again because he tends to forget them within minutes he reports.  His primary care physician [name] had already ordered MRI  of the brain with CT clinical data of memory loss over the last 2 years.  The patient is status post cervical fusion procedure beginning at C3 and he had no acute findings on his MRI dated 11/22-2021.   Age-related volume loss was commented on mild chronic small vessel ischemic changes, tortuous and ectatic vessels were noted but no aneurysm or calcification or stenosis.  And left mastoid effusion was coincidentally also commented upon this MRI of the brain was done without contrast probably due to the patient's history of a chronic renal impairment. It is his wife's impression that he mind is getting more and more foggy and he unsure about driving, he has a continuous headache.     PAST VISIT :  Donald [name]. Donald [name] is a meanwhile 71 year old right-handed Caucasian gentleman and avid motorcyclist.  He was first seen for a sleep evaluation in May 2013 by Dr. Danton Sewer.. This was followed by a sleep study dated 27 October 2011.  He was not happy with the results and  care but was given an oxygen concentrator from [place] he remembers that the oxygen was delivered already in March 2013 after an overnight pulse oximetry.  A new sleep evaluation was performed through me at [facility] in December 2014 and he underwent another sleep study here.  On 25 April 2013 his sleep study showed an overall AHI of only 5.7/h, and very mild degree of apnea but his oxygen nadir was 77% and he spent sustained time in low oxygen.  The total hypoxemia time at 89 and below was 2 hours and 45 minutes, the patient was therefore placed on oxygen supplement which he already owns as I understand.  He returned for a CPAP titration because I can only order oxygen as an adjunct therapy to obstructive sleep apnea or central sleep apnea therapy.  Oxygen strictly for hypoxemia with exercise and not sleep-related would need to be provided by a pulmonologist.   CPAP titration followed on 04 July 2013 his AHI was reduced to 1.9 /h.  He reported significant difficulties to stay asleep.  His sleep latency was 65 minutes in the sleep lab.  I understand that the patient has continued to use his CPAP on a regular basis in spite of not having followed up with me.  The CPAP settings that he uses now at the same but he no longer has oxygen available.  He purchased I understand a pulse oximetry device and has used it for nights with CPAP in place and 1 night without CPAP use.  All nights had prolonged severely prolonged hypoxemia.  This only confirms what we saw in 2015 that his apnea is not the main driver of hypoxemia.  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  GERD, heart murmur, hematuria, hypercholesterolemia and hypertension, history of prostatitis, community-acquired pneumonia.   Coronary artery disease evaluation was negative with an EF of 55% in 2013.   Donald [name] struggles with sinus congestion often has headaches when sleeping on his left side feeling congested and developing a pressure behind the eyes, he has nocturia 2-3 times each night, he appears depressed may be frustrated, stating that he is not excessively daytime sleepy but he also does not feel refreshed or restored in the mornings.  Family medical and sleep history: His father died at age 2 of coronary artery  disease he also had diabetes type 2 and degenerative joint joint disease.  His mother died also early at 43 years of age had a history of nephrolithiasis and coronary artery disease.  His sister who is 6 years younger than the patient has thyroid problems she has undergone bariatric surgery for morbid obesity..   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  he has a son age 63, he is married since 1980,  he drinks coffee in the morning and crystallite with caffeine after 3 PM in the afternoon he has no history of shiftwork. No more alcohol. Fatty liver. Renal dysfunction, IGA nephropathy.    Sleep habits are as follows: The patient no longer has oxygen available but uses his CPAP at 9 cm water pressure which is now over 3 years old and he still using nasal pillows.  He describes a dinnertime at around 7 PM bedtime is usually around 9:30 PM always with CPAP, the bedroom is described as cool, quiet and dark he shares a bedroom with his wife.  He falls asleep usually within 15 minutes he has begun trying to sleep supine which for an apnea patient is not a preferred sleep position but he uses 3 pillows under wedge to keep himself propped up.  He states that when  he sleeps on one side sinus congestion sets in and his nose.  Up.  Usually worse when he sleeps on his left side.  He wakes up 2-3 times each night for nocturia and finally rises at 8 AM but only with alarm.  He does not feel refreshed and restored spontaneously and without alarm would be sleeping until noon.  He does not nap in daytime he reports.  He has sometimes been able to fall asleep in a recliner when he is reading or relaxed.  He has isolated occurrences of sleep paralysis.  As I described he has had multiple pulse oximetry's and perform these on himself with and without CPAP in place and found that he is still significantly hypoxic at this time.  However he is without oxygen. Memory los:    Montreal Cognitive Assessment  05/10/2021 04/15/2020  Visuospatial/ Executive (0/5) 5 4  Naming (0/3) 3 3  Attention: Read list of digits (0/2) 2 2  Attention: Read list of letters (0/1) 1 1  Attention: Serial 7 subtraction starting at 100 (0/3) 2 3  Language: Repeat phrase (0/2) 2 2  Language : Fluency (0/1) 1 1  Abstraction (0/2) 2 2  Delayed Recall (0/5) 2 out of 5 2  Orientation (0/6) 6 6  Total 27 26   MMSE - Mini Mental State Exam 10/18/2020 04/15/2020  Orientation to time 4 5  Orientation to Place 5 5  Registration 3 3  Attention/ Calculation 5 5  Recall 2 3  Language- name 2 objects 2 2  Language- repeat 1 1  Language- follow 3 step command 3 3  Language- read & follow direction 1 -  Write a sentence 1 -  Copy design 1 -  Total score 28 -     Out of a complete 14 system review, the patient complains of only the following symptoms, and all other reviewed systems are negative.:  Fatigue, headaches,  irritability, impulse control failure, now angry, with personality changes.    How likely are you to doze in the following situations: 0 = not likely, 1 = slight chance, 2 = moderate chance, 3 = high chance   Sitting and Reading? Watching Television? Sitting inactive in a public place  (theater or meeting)? As a passenger in a car for an hour without a break? Lying down in the afternoon when circumstances permit? Sitting and talking to someone? Sitting quietly after lunch without alcohol? In a car, while stopped for a few minutes in traffic?   Total =3/ 24 points  down from 8 points.   FSS endorsed at 29/ 63 points.  GDS not answered.   MOCA 26/ 30 points. 04-2021, next MOCA due in 12 months.  REsMed mask, LUNA G 3 machine.   Cognitive ; ankle edema , coughing.    Social History   Socioeconomic History   Marital status: Married    Spouse name: Diane   Number of children: 1   Years of education: 14   Highest education level: Not on file  Occupational History   Occupation: Herbalist: Interior and spatial designer  Tobacco Use   Smoking status: Never   Smokeless tobacco: Never   Tobacco comments:    Second hand smoke for 26 years per pt  Vaping Use   Vaping Use: Never used  Substance and Sexual Activity   Alcohol use: No    Alcohol/week: 0.0 standard drinks   Drug use: No   Sexual activity: Not on file  Other Topics Concern   Not on file  Social History Narrative   Patient is married (Diane) and lives at home with his wife.   Patient has one child.   Patient is working full-time.   Patient has a Geophysicist/field seismologist.   Patient is left-handed.   Patient drinks one glass of tea daily, one soda daily.   Social Determinants of Health   Financial Resource Strain: Not on file  Food Insecurity: Not on file  Transportation Needs: Not on file  Physical Activity: Not on file  Stress: Not on file  Social Connections: Not on file    Family History  Problem Relation Age of Onset   Coronary artery disease Father    Diabetes type II Father    Heart attack Father     Past Medical History:  Diagnosis Date   Allergic rhinitis, seasonal    Asthma    Bronchitis    Chronic kidney disease, stage I    collar bone    dislocation left side 05/2014    Degenerative joint disease of knee    bilateral   Diabetes mellitus    GERD (gastroesophageal reflux disease)    Heart murmur    history of   History of acute prostatitis    History of hematuria    Hyperlipidemia    Hypertension    IgA nephropathy    OSA on CPAP    Pneumonia    Sinusitis     Past Surgical History:  Procedure Laterality Date   BIOPSY  05/17/2020   Procedure: BIOPSY;  Surgeon: Clarene Essex, MD;  Location: [facility];  Service: Endoscopy;;   CARDIAC CATHETERIZATION  09/29/2011   normal   CERVICAL SPINE SURGERY     COLONOSCOPY WITH PROPOFOL N/A 05/17/2020   Procedure: COLONOSCOPY WITH PROPOFOL;  Surgeon: Clarene Essex, MD;  Location: [facility];  Service: Endoscopy;  Laterality: N/A;   deviated septum  1930s  ESOPHAGOGASTRODUODENOSCOPY (EGD) WITH PROPOFOL N/A 05/17/2020   Procedure: ESOPHAGOGASTRODUODENOSCOPY (EGD) WITH PROPOFOL;  Surgeon: Clarene Essex, MD;  Location: [facility];  Service: Endoscopy;  Laterality: N/A;   HEMOSTASIS CLIP PLACEMENT  05/17/2020   Procedure: HEMOSTASIS CLIP PLACEMENT;  Surgeon: Clarene Essex, MD;  Location: [facility];  Service: Endoscopy;;   HERNIA REPAIR     LEFT HEART CATHETERIZATION WITH CORONARY ANGIOGRAM N/A 09/29/2011   Procedure: LEFT HEART CATHETERIZATION WITH CORONARY ANGIOGRAM;  Surgeon: Sanda Klein, MD;  Location: [facility];  Service: Cardiovascular;  Laterality: N/A;   NM MYOVIEW LTD  07/08/2011   mild LV dilatation,mild septal hypokinesia   POLYPECTOMY  05/17/2020   Procedure: POLYPECTOMY;  Surgeon: Clarene Essex, MD;  Location: [facility];  Service: Endoscopy;;   TONSILLECTOMY     US ECHOCARDIOGRAPHY  09/07/2011   EF 35-45%,mod.ant hypokinesis,boderline LA & RA enlargement,trace MR,TR & PI     Current Outpatient Medications on File Prior to Visit  Medication Sig Dispense Refill   albuterol (PROVENTIL HFA;VENTOLIN HFA) 108 (90 BASE) MCG/ACT inhaler Inhale 2 puffs into the lungs every 6 (six) hours as needed for wheezing  or shortness of breath. For shortness of breath     Ascorbic Acid (VITAMIN C PO) Take 1,000 mg by mouth daily.     aspirin EC 81 MG tablet See admin instructions.     Continuous Blood Gluc Sensor (FREESTYLE LIBRE 14 DAY SENSOR) MISC      Cyanocobalamin (VITAMIN B12) 500 MCG TABS Take 500 mcg by mouth daily.     donepezil (ARICEPT) 5 MG tablet 1 tablet at bedtime.     Dulaglutide (TRULICITY) 4.09 WJ/1.9JY SOPN See admin instructions.     DULoxetine (CYMBALTA) 30 MG capsule Take 90 mg by mouth daily.     finasteride (PROSCAR) 5 MG tablet Take 5 mg by mouth daily.     furosemide (LASIX) 20 MG tablet Take 1 tablet (20 mg total) by mouth daily in the afternoon. 90 tablet 3   HYDROcodone-acetaminophen (NORCO/VICODIN) 5-325 MG tablet Take one tablet by mouth every 6 hours for severe pain Dx: R07.51     Insulin Aspart FlexPen (NOVOLOG) 100 UNIT/ML Inject 40-60 Units into the skin as needed.     insulin degludec (TRESIBA FLEXTOUCH) 200 UNIT/ML FlexTouch Pen Inject 90 Units into the skin daily in the afternoon.     Insulin Pen Needle (UNIFINE PENTIPS PLUS) 32G X 4 MM MISC USE WITH TRESIBA DAILY     memantine (NAMENDA) 10 MG tablet Take 1 tablet (10 mg total) by mouth 2 (two) times daily. 60 tablet 11   metFORMIN (GLUCOPHAGE) 1000 MG tablet Take 1 tablet (1,000 mg total) by mouth 2 (two) times daily with a meal. Restart on 8/22     metoprolol succinate (TOPROL-XL) 50 MG 24 hr tablet Take 50 mg by mouth 2 (two) times daily. Take with or immediately following a meal.     Omega-3 Fatty Acids (FISH OIL) 1000 MG CPDR Take 2,000 Units by mouth daily.     pantoprazole (PROTONIX) 40 MG tablet Take 40 mg by mouth daily.     rosuvastatin (CRESTOR) 20 MG tablet Take 1 tablet (20 mg total) by mouth daily. 90 tablet 3   sacubitril-valsartan (ENTRESTO) 97-103 MG Take 1 tablet by mouth 2 (two) times daily. 180 tablet 1   silodosin (RAPAFLO) 8 MG CAPS capsule Take 8 mg by mouth daily.     spironolactone (ALDACTONE) 25 MG  tablet Take 1 tablet (25 mg total) by mouth  daily. 90 tablet 3   No current facility-administered medications on file prior to visit.    Allergies  Allergen Reactions   Ciprofloxacin     Tendons problem in shoulder Other reaction(s): Arthralgias (intolerance), Myalgias (intolerance)   Empagliflozin Anaphylaxis    Dehydration   Levofloxacin     Other reaction(s): Arthralgias (intolerance), Myalgias (intolerance)   Aricept [Donepezil Hcl] Nausea Only   Bactrim [Sulfamethoxazole-Trimethoprim] Hives   Levofloxacin Other (See Comments)    Muscle tighten up in left and right calf   Misc. Sulfonamide Containing Compounds Rash    Physical exam:  Today's Vitals   07/19/21 1516  BP: 108/72  Pulse: 85  Weight: 202 lb 6.4 oz (91.8 kg)  Height: $Remove'5\' 9"'YWQTgPM$  (1.753 m)   Body mass index is 29.89 kg/m.   Wt Readings from Last 3 Encounters:  07/19/21 202 lb 6.4 oz (91.8 kg)  07/08/21 198 lb 3.2 oz (89.9 kg)  06/03/21 202 lb 12.8 oz (92 kg)     Ht Readings from Last 3 Encounters:  07/19/21 $RemoveB'5\' 9"'FJnVwxfh$  (1.753 m)  06/01/21 $RemoveB'5\' 9"'YXzbbJpu$  (1.753 m)  05/13/21 $RemoveB'5\' 9"'cunSqBdo$  (1.753 m)      General: The patient is awake, alert and appears not in acute distress. The patient is well groomed. Head: Normocephalic, atraumatic. Cardiovascular:  Regular rate and cardiac rhythm by pulse,  without distended neck veins. Respiratory: Skin:  evidence of ankle edema, or rash. He appears jaundiced.  Trunk: The patient's posture is erect.  raspy voice, hoarseness, dysphonia.  Neurologic exam : The patient is awake and alert, oriented to place and time.   Memory subjective described as impaired-  Montreal Cognitive Assessment  05/10/2021 04/15/2020  Visuospatial/ Executive (0/5) 5 4  Naming (0/3) 3 3  Attention: Read list of digits (0/2) 2 2  Attention: Read list of letters (0/1) 1 1  Attention: Serial 7 subtraction starting at 100 (0/3) 2 3  Language: Repeat phrase (0/2) 2 2  Language : Fluency (0/1) 1 1  Abstraction (0/2) 2 2   Delayed Recall (0/5) 2 2  Orientation (0/6) 6 6  Total 26 26      Attention span & concentration ability appears restricted.  Speech is fluent,  without  dysarthria,  aphasia.  Mood and affect are here normal, but wife reports episode of inappropriate anger, blunt comments. .   Cranial nerves: no loss of smell or taste reported  Pupils are equal and briskly reactive to light. Funduscopic exam deferred.  Extraocular movements in vertical and horizontal planes were intact and without nystagmus.  No Diplopia. Visual fields by finger perimetry are intact. Hearing was intact to soft voice and finger rubbing.    Facial sensation intact to fine touch.  Facial motor strength is symmetric and tongue and uvula move midline.  Neck ROM : rotation, tilt and flexion extension were normal for age and ligament damage-shoulder shrug was symmetrical- but he reports shoulder pain, arthritis, having lost arm swing. .    Motor exam:  Symmetric bulk, tone and ROM.   Normal tone without cog- wheeling, symmetric grip strength .   Sensory:  Fine touch and vibration were lost in both feet /ankles.  Proprioception tested in the upper extremities was normal.   Coordination: The Finger-to-nose maneuver was intact without evidence of ataxia, dysmetria or tremor.   Gait and station: Patient could rise unassisted from a seated position, walked without assistive device.  Stance is of normal width/ base and the patient turned with 4 steps.  He walked with reduced arm swing on the left versus right  Toe and heel walk were deferred.  Deep tendon reflexes: in the  upper and lower extremities are symmetric and trace.      After spending a total time of 25 minutes face to face and additional time for physical and neurologic examination, review of laboratory studies,  personal review of imaging studies, reports and results of other testing and review of referral information / records as far as provided in visit, I  have established the following assessments:  1)  OSA- severe and well controlled on new auto CPAP, this is a replacement machine.  He uses nasal pillows and pressures between 7-12 cm water.  Total sleep latency was found to be 65 minutes in sleep lab during CPAP titration in 06/2013. Oxygen for hypoxemia no longer needed?   New CPAP just delivered last days of Dec 2022. with nasal pillows. He has been using compliantly 100%.  Needs an overnight ONO on CPAP to see if hypoxia is present.   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 Ordered PET scan, metabolic MRI- MOCA was indicative of MCI 26/ 30 points, almost "normal"  .   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 However, there are a few areas of relative weakness suggestive of mild fronto-subcortical dysfunction (e.g., variable encoding abilities with intact consolidation; decreased information processing speed). 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  Aricept was not tolerated , he uses namenda without side effects    My Plan is to proceed with:  1) addendum : positive overnight ONO while on CPAP . Ordered this study to allow oxygen to be titrated - this patient will have to return to the sleep lab in order to document need and effect of oxygen on hypoxia in sleep .   2) I like to ask Dr Dagmar Hait to consider a hepatic MRI and /or [place]. Patient reports bloating, and extension under rib cage. This may contribute to fatigue, hypersomnia. Hepatic congestion, fatty liver/ Pancreatic insufficiency? .   3)  Namenda , will maintain on 10 mg bid. MOCA was improved. 27/ 30  stabile. Encourage hydration, exercises, supportive shoes. Water exercise.   Rv in 12 months/  I would like to thank  Prince Solian, [place],  [place] [address] for allowing me to meet with and to take care of this pleasant patient.   In short, Donald [name] is presenting with slowly progressing memory loss.   Next Sleep clinic RV through our NP in 12 months and bring CPAP with you!!!!. We will do a MOCA.    Addendum: Larey Seat, MD   Ordered this study to allow oxygen to be titrated - this patient will have to return to the sleep lab in order to document need and effect of oxygen on hypoxia in sleep .  RV in 3 months when on CPAP and oxygen . Electronically signed by: Larey Seat, MD 07/19/2021 3:33 PM  [facility] Sleep Board certified by The [employer] of Sleep Medicine and Diplomate of the [employer] of Sleep Medicine. Board certified In Neurology through the [place], Fellow of the [employer] of Neurology.  Medical Director of [employer].

## 2021-07-19 NOTE — Patient Instructions (Signed)
I ordered a pulse-oximetry on CPAP to see if oxygen is needed.  ?I also wanted to repeat a MOCA with yearly visit , will follow NP. 12 months.  ? ? ?

## 2021-07-20 NOTE — Progress Notes (Signed)
CM sent to [facility] for new order ?

## 2021-07-27 ENCOUNTER — Ambulatory Visit: Payer: HMO | Admitting: Family Medicine

## 2021-08-02 DIAGNOSIS — G4733 Obstructive sleep apnea (adult) (pediatric): Secondary | ICD-10-CM | POA: Diagnosis not present

## 2021-08-02 DIAGNOSIS — G3184 Mild cognitive impairment, so stated: Secondary | ICD-10-CM | POA: Diagnosis not present

## 2021-08-02 DIAGNOSIS — G4710 Hypersomnia, unspecified: Secondary | ICD-10-CM | POA: Diagnosis not present

## 2021-08-03 ENCOUNTER — Telehealth: Payer: Self-pay | Admitting: Neurology

## 2021-08-03 NOTE — Telephone Encounter (Signed)
Patient completed an overnight oximetry test while wearing CPAP. ?There was a total of 8 hours of 10 minutes and 42 seconds of the recording.  ?Out of recorded time there was a total of 7 minutes and 42 seconds where the oxygen level was below 88%.   ?There was also a recorded 20 minutes and 4 seconds where the oxygen was spent below 89%.   ?Given this information I will have Dr. Brett Fairy reviewed the report and determine if any other intervention is necessary at this time. ?

## 2021-08-04 ENCOUNTER — Encounter: Payer: Self-pay | Admitting: Neurology

## 2021-08-05 NOTE — Telephone Encounter (Signed)
Dear Mr. Auman ?So oxygen works well, the 20 minutes include the time you were moving, I suppose for the bathroom break.  ? ?Casey/ Pool 1:   ? ?Did we place the patient on oxygen or another entity?  ?If yes, can we send an oxygen order to DME, or do we need another ONO on PAP alone to show the hypoxia level without oxygen supplement?  ? ? ? ?

## 2021-08-11 NOTE — Progress Notes (Signed)
[facility], Luna machine - 100 % compliance and very low residual AHI- great results!  ? ?FYI to Dr Dagmar Hait. ?

## 2021-08-17 ENCOUNTER — Encounter: Payer: Self-pay | Admitting: Cardiovascular Disease

## 2021-08-17 ENCOUNTER — Ambulatory Visit: Payer: HMO | Admitting: Cardiovascular Disease

## 2021-08-17 VITALS — BP 101/64 | HR 75 | Ht 69.0 in | Wt 200.4 lb

## 2021-08-17 DIAGNOSIS — Z794 Long term (current) use of insulin: Secondary | ICD-10-CM

## 2021-08-17 DIAGNOSIS — E785 Hyperlipidemia, unspecified: Secondary | ICD-10-CM | POA: Diagnosis not present

## 2021-08-17 DIAGNOSIS — N182 Chronic kidney disease, stage 2 (mild): Secondary | ICD-10-CM | POA: Diagnosis not present

## 2021-08-17 DIAGNOSIS — E118 Type 2 diabetes mellitus with unspecified complications: Secondary | ICD-10-CM | POA: Diagnosis not present

## 2021-08-17 DIAGNOSIS — I5042 Chronic combined systolic (congestive) and diastolic (congestive) heart failure: Secondary | ICD-10-CM

## 2021-08-17 DIAGNOSIS — E782 Mixed hyperlipidemia: Secondary | ICD-10-CM | POA: Diagnosis not present

## 2021-08-17 DIAGNOSIS — I2510 Atherosclerotic heart disease of native coronary artery without angina pectoris: Secondary | ICD-10-CM

## 2021-08-17 DIAGNOSIS — I10 Essential (primary) hypertension: Secondary | ICD-10-CM

## 2021-08-17 DIAGNOSIS — I129 Hypertensive chronic kidney disease with stage 1 through stage 4 chronic kidney disease, or unspecified chronic kidney disease: Secondary | ICD-10-CM | POA: Diagnosis not present

## 2021-08-17 DIAGNOSIS — E119 Type 2 diabetes mellitus without complications: Secondary | ICD-10-CM

## 2021-08-17 DIAGNOSIS — G4733 Obstructive sleep apnea (adult) (pediatric): Secondary | ICD-10-CM

## 2021-08-17 NOTE — Progress Notes (Signed)
?Cardiology Office Note:   ? ?Date:  08/17/2021  ? ?ID:  Donald [name], DOB 02-21-1951, MRN [id number] ? ?PCP:  Prince Solian, MD ?  ?[facility] Providers ?Cardiologist:  Sanda Klein, MD    ? ?Referring MD: Prince Solian, MD  ? ?Chief Complaint  ?Patient presents with  ? Congestive Heart Failure  ? ? ? ?History of Present Illness:   ? ?Donald [name] is a 71 y.o. male with a hx of nonischemic cardiomyopathy dating back to at least 2013 when he had coronary angiography that showed no evidence of significant obstructive disease.  Additional medical problems include treated hypertension, type 2 diabetes mellitus, hypercholesterolemia, OSA on CPAP.   ? ?In 2013, nuclear stress that showed a left ventricular ejection fraction of 50%, with a normal perfusion pattern and mild left ventricular dilatation.  His echocardiogram also showed an EF of 50-55% with mild diffuse left ventricular hypokinesis and no significant valvular abnormalities.  In 2016, his pulmonary specialist Dr. Chase Caller ordered a cardiopulmonary excise stress test that showed mild-mod reduced functional capacity without any clear evidence of cardiovascular limitations.  Chronotropic response was normal.  The peak VO2 was mildly reduced at 23.5 mL/kilogram/minute (76% of predicted).  VE/VCO2 slope was normal. ? ?Recent echocardiogram performed 03/22/2021 shows further reduction in LVEF which is now down to 35-40%.  Diastolic parameters suggest normal filling pressures.  The systolic PA pressure is normal at 29 mmHg.Once again there are no significant valvular abnormalities. ? ?We have transitioned him to [facility] and he has reached maximum dose.  He is also on metoprolol and a relatively high dose of 50 mg twice daily and spironolactone.  We have decided against the use of SGLT2 inhibitors due to his frequent urinary tract infections. ? ?He gives somewhat conflicting reports on his review of systems today.  He states that he is generally  doing "pretty good" but feels that his "breathing is going downhill".  On more detailed questioning he seems to describe NYHA functional class II shortness of breath.  He exercises on the elliptical for 5 minutes at a time but has to stop due to fatigue, no dyspnea.  Sometimes goes to the gym (his wife's expressions suggest that this is very rare).  He was able to walk to the hilly paths at [employer] for the [place] reenactment, but felt very short of breath.  He does not have shortness of breath with personal care activities.  He wears CPAP and oxygen whenever he sleeps.  Denies lower extremity edema.  Does not have orthopnea or PND.  He has a cough associated with a feeling that his throat is always full of secretions and which gets worse when he lays down at night, but does not prevent him from falling asleep.  He has a "permanent UTI".  He describes a lot of problems with fatigue that is present as soon as he wakes up in the morning.  However he has "good days and bad days" and sometimes has plenty of energy.  He can fall asleep promptly whenever he decides to, but does not have irresistible somnolence that prevents him from watching a movie or reading a book.  He has not had dizziness, palpitations, syncope or focal neurological events. ? 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  He also has some early cognitive deficits especially short-term memory problems and is seeing Dr. Brett Fairy , who  also monitors his CPAP for OSA.  He is taking memantine with some benefit.  ? ?Glycemic control is excellent with a hemoglobin A1c that was recently 6.8%.  The follow-up lipid profile in January 2023 showed somewhat improved LDL at 124, but HDL remains low at 40 and triglycerides are elevated at 386. ? ? ? ?Past Medical History:  ?Diagnosis Date  ? Allergic rhinitis, seasonal   ? Asthma   ? Bronchitis   ? Chronic  kidney disease, stage I   ? collar bone   ? dislocation left side 05/2014  ? 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 Diabetes mellitus   ? GERD (gastroesophageal reflux disease)   ? Heart murmur   ? history of  ? History of acute prostatitis   ? History of hematuria   ? Hyperlipidemia   ? Hypertension   ? IgA nephropathy   ? OSA on CPAP   ? Pneumonia   ? Sinusitis   ? ? ?Past Surgical History:  ?Procedure Laterality Date  ? BIOPSY  05/17/2020  ? Procedure: BIOPSY;  Surgeon: Clarene Essex, MD;  Location: [facility];  Service: Endoscopy;;  ? CARDIAC CATHETERIZATION  09/29/2011  ? normal  ? CERVICAL SPINE SURGERY    ? COLONOSCOPY WITH PROPOFOL N/A 05/17/2020  ? Procedure: COLONOSCOPY WITH PROPOFOL;  Surgeon: Clarene Essex, MD;  Location: [facility];  Service: Endoscopy;  Laterality: N/A;  ? deviated septum  1930s  ? ESOPHAGOGASTRODUODENOSCOPY (EGD) WITH PROPOFOL N/A 05/17/2020  ? Procedure: ESOPHAGOGASTRODUODENOSCOPY (EGD) WITH PROPOFOL;  Surgeon: Clarene Essex, MD;  Location: [facility];  Service: Endoscopy;  Laterality: N/A;  ? HEMOSTASIS CLIP PLACEMENT  05/17/2020  ? Procedure: HEMOSTASIS CLIP PLACEMENT;  Surgeon: Clarene Essex, MD;  Location: [facility];  Service: Endoscopy;;  ? HERNIA REPAIR    ? LEFT HEART CATHETERIZATION WITH CORONARY ANGIOGRAM N/A 09/29/2011  ? Procedure: LEFT HEART CATHETERIZATION WITH CORONARY ANGIOGRAM;  Surgeon: Sanda Klein, MD;  Location: [facility] CATH LAB;  Service: Cardiovascular;  Laterality: N/A;  ? NM MYOVIEW LTD  07/08/2011  ? mild LV dilatation,mild septal hypokinesia  ? POLYPECTOMY  05/17/2020  ? Procedure: POLYPECTOMY;  Surgeon: Clarene Essex, MD;  Location: [facility];  Service: Endoscopy;;  ? TONSILLECTOMY    ? US ECHOCARDIOGRAPHY  09/07/2011  ? EF 35-45%,mod.ant hypokinesis,boderline LA & RA enlargement,trace MR,TR & PI  ? ? ?Current Medications: ?Current Meds  ?Medication Sig  ? Continuous Blood Gluc Sensor (FREESTYLE LIBRE 14 DAY SENSOR) MISC   ? Cyanocobalamin (VITAMIN B12) 500  MCG TABS Take 500 mcg by mouth daily.  ? DULoxetine (CYMBALTA) 30 MG capsule Take 90 mg by mouth daily.  ? DULoxetine (CYMBALTA) 30 MG capsule Take 30 mg by mouth daily.  ? finasteride (PROSCAR) 5 MG tablet Take 5 mg by mouth daily.  ? furosemide (LASIX) 20 MG tablet Take 1 tablet (20 mg total) by mouth daily in the afternoon.  ? HYDROcodone-acetaminophen (NORCO/VICODIN) 5-325 MG tablet Take one tablet by mouth every 6 hours for severe pain Dx: R07.51  ? Insulin Aspart FlexPen (NOVOLOG) 100 UNIT/ML Inject 40-60 Units into the skin as needed.  ? insulin degludec (TRESIBA FLEXTOUCH) 200 UNIT/ML FlexTouch Pen Inject 90 Units into the skin daily in the afternoon.  ? Insulin Pen Needle (UNIFINE PENTIPS PLUS) 32G X 4 MM MISC USE WITH TRESIBA DAILY  ? memantine (NAMENDA) 10 MG tablet Take 1 tablet (10 mg total) by mouth 2 (two) times daily.  ? metFORMIN (GLUCOPHAGE) 1000 MG tablet Take  1 tablet (1,000 mg total) by mouth 2 (two) times daily with a meal. Restart on 8/22  ? metoprolol succinate (TOPROL-XL) 50 MG 24 hr tablet Take 50 mg by mouth 2 (two) times daily. Take with or immediately following a meal.  ? Multiple Vitamins-Minerals (CENTRUM SILVER 50+MEN PO) Take 1 tablet by mouth daily in the afternoon.  ? Omega-3 Fatty Acids (FISH OIL) 1000 MG CPDR Take 2,000 Units by mouth daily.  ? pantoprazole (PROTONIX) 40 MG tablet Take 40 mg by mouth daily.  ? rosuvastatin (CRESTOR) 20 MG tablet Take 1 tablet (20 mg total) by mouth daily.  ? sacubitril-valsartan (ENTRESTO) 97-103 MG Take 1 tablet by mouth 2 (two) times daily.  ? silodosin (RAPAFLO) 8 MG CAPS capsule Take 8 mg by mouth daily.  ? spironolactone (ALDACTONE) 25 MG tablet Take 1 tablet (25 mg total) by mouth daily.  ?  ? ?Allergies:   Ciprofloxacin, Empagliflozin, Levofloxacin, Aricept [donepezil hcl], Bactrim [sulfamethoxazole-trimethoprim], Levofloxacin, and Misc. sulfonamide containing compounds  ? ?Social History  ? ?Socioeconomic History  ? Marital status:  Married  ?  Spouse name: Diane  ? Number of children: 1  ? Years of education: 46  ? Highest education level: Not on file  ?Occupational History  ? Occupation: Government social research officer  ?  Employer: [employer]

## 2021-08-17 NOTE — Patient Instructions (Signed)
Medication Instructions:  No changes *If you need a refill on your cardiac medications before your next appointment, please call your pharmacy*   Lab Work: None ordered If you have labs (blood work) drawn today and your tests are completely normal, you will receive your results only by: MyChart Message (if you have MyChart) OR A paper copy in the mail If you have any lab test that is abnormal or we need to change your treatment, we will call you to review the results.   Testing/Procedures: Your physician has requested that you have an echocardiogram. Echocardiography is a painless test that uses sound waves to create images of your heart. It provides your doctor with information about the size and shape of your heart and how well your heart's chambers and valves are working. You may receive an ultrasound enhancing agent through an IV if needed to better visualize your heart during the echo.This procedure takes approximately one hour. There are no restrictions for this procedure. This will take place at the [address], [address].     Follow-Up: At [facility], you and your health needs are our priority.  As part of our continuing mission to provide you with exceptional heart care, we have created designated Provider Care Teams.  These Care Teams include your primary Cardiologist (physician) and Advanced Practice Providers (APPs -  Physician Assistants and Nurse Practitioners) who all work together to provide you with the care you need, when you need it.  We recommend signing up for the patient portal called "MyChart".  Sign up information is provided on this After Visit Summary.  MyChart is used to connect with patients for Virtual Visits (Telemedicine).  Patients are able to view lab/test results, encounter notes, upcoming appointments, etc.  Non-urgent messages can be sent to your provider as well.   To learn more about what you can do with MyChart, go to [email or web address].     Your next appointment:   6 month(s)  The format for your next appointment:   In Person  Provider:   [name] Croitoru, MD {  Important Information About Sugar       

## 2021-08-31 ENCOUNTER — Ambulatory Visit (HOSPITAL_COMMUNITY): Payer: HMO | Attending: Cardiology

## 2021-08-31 DIAGNOSIS — I5042 Chronic combined systolic (congestive) and diastolic (congestive) heart failure: Secondary | ICD-10-CM | POA: Insufficient documentation

## 2021-08-31 LAB — ECHOCARDIOGRAM COMPLETE
Area-P 1/2: 3.23 cm2
S' Lateral: 4.6 cm

## 2021-09-08 DIAGNOSIS — G4733 Obstructive sleep apnea (adult) (pediatric): Secondary | ICD-10-CM | POA: Diagnosis not present

## 2021-09-12 DIAGNOSIS — Z6828 Body mass index (BMI) 28.0-28.9, adult: Secondary | ICD-10-CM | POA: Diagnosis not present

## 2021-09-12 DIAGNOSIS — G4733 Obstructive sleep apnea (adult) (pediatric): Secondary | ICD-10-CM | POA: Diagnosis not present

## 2021-09-12 DIAGNOSIS — I509 Heart failure, unspecified: Secondary | ICD-10-CM | POA: Diagnosis not present

## 2021-09-15 ENCOUNTER — Encounter: Payer: Self-pay | Admitting: Cardiovascular Disease

## 2021-09-16 ENCOUNTER — Telehealth: Payer: Self-pay | Admitting: Cardiovascular Disease

## 2021-09-16 NOTE — Telephone Encounter (Signed)
Pt c/o medication issue: ? ?1. Name of Medication:  ?sacubitril-valsartan (ENTRESTO) 97-103 MG ? ?2. How are you currently taking this medication (dosage and times per day)?  ?As prescribed ? ?3. Are you having a reaction (difficulty breathing--STAT)?  ? ? ?4. What is your medication issue?  ?Patient's wife states Delene Loll will cost the patient $500/90 day supply which they are unable to afford. She would like to know if Dr. Sallyanne Kuster can recommend a less expensive alternative or assistance with purchasing. ?Patient's wife states the patient took his last tablet yesterday morning. ? ?

## 2021-09-16 NOTE — Telephone Encounter (Signed)
Patient's wife reports that when she went to pick up Entresto from pharmacy, the price was $500.00 for 90 days. There are no samples available. Pateitn took last [employer] yesterday. Is there another medication the patient can take? ?

## 2021-09-17 NOTE — Telephone Encounter (Signed)
While we figure this out, please prescribe valsartan 320 mg daily. ?Suspect this may be a "doughnut hole" issue. ?Can we apply for assistance? ?

## 2021-09-19 NOTE — Telephone Encounter (Signed)
Spoke with wife who stated they went ahead and paid for the [facility] because patient's BP was staring to creep up. Link for patient assistance placed in MyChart. Do you want to cancel the valsartan order? ?

## 2021-09-19 NOTE — Telephone Encounter (Signed)
LMTCB

## 2021-09-20 ENCOUNTER — Ambulatory Visit: Payer: HMO | Admitting: Psychology

## 2021-09-23 ENCOUNTER — Telehealth: Payer: Self-pay | Admitting: Cardiovascular Disease

## 2021-09-23 NOTE — Telephone Encounter (Signed)
Spoke with patient's wife. She reports he wakes up in the AM and after napping -- he feels fatigued even after this. He does work out at [employer] with a Physiological scientist and she said he reports he could just "lay in the floor and go to sleep". She said he has to "push himself to stay awake"   She reports at last visit, he had some coughing. He started claritin since last visit. Cough has not changed.   She reports similar episode a year ago and he "kinda got out of it"   No issues with low BP or low HR readings in last month  BP mid-110s/70s   No bleeding issues No constipation  No issues with hydration or PO intake.  He is able to complete physical activty at the gym without concerns  Advised wife he could try different antihistamine, just avoid -D or -DM Will send to MD to review

## 2021-09-23 NOTE — Telephone Encounter (Signed)
Please make sure he does not take any antihistamines that can have sedating effect (such as Benadryl = diphenhydramine, NyQuil, etc.) Even Zyrtec can have some sedating side effects. Only nonsedating antihistamines available OTC are loratadine (Claritin) and fexofenadine (Allegra). If heart rate is less than 55, recommend cutting the beta-blocker dose in half.

## 2021-09-23 NOTE — Telephone Encounter (Signed)
Spoke with patient's wife   Advised to monitor HR and less than 55, decrease metoprolol succinate in half (to '25mg'$  BID). If this is not an issue, can try different antihistamine, as MD suggested.   She voiced understanding.

## 2021-09-23 NOTE — Telephone Encounter (Signed)
Pt's wife states that she would like for pt's medication list to be reviewed. Wife states that pt has been very tired and it's herd for him to stay wake. She says that pt has been like this for 3 weeks now. Please advise

## 2021-10-11 ENCOUNTER — Telehealth: Payer: Self-pay | Admitting: Neurology

## 2021-10-11 DIAGNOSIS — G3184 Mild cognitive impairment, so stated: Secondary | ICD-10-CM

## 2021-10-11 MED ORDER — MEMANTINE HCL 28 X 5 MG & 21 X 10 MG PO TABS: ORAL_TABLET | ORAL | 0 refills | Status: DC

## 2021-10-11 NOTE — Telephone Encounter (Signed)
Pt's wife, Jeffery Bachmeier [facility])  Pt stop taking memantine (NAMENDA) 10 MG tablet on May 13. Have notice pt having more memory issues, mood swings. Would like a call from the nurse to discuss if he needs to go ahead restart medication at a lower dose.  Contact Info: [phone]

## 2021-10-11 NOTE — Telephone Encounter (Signed)
Called wife back. States he stopped memantine d/t seeing something online not knowing if it really helped after a year of use. She thought he was still taking but found out she stopped 09/17/21.  He has noticed his memory is worsening. Seems to get more agitated more easily. Would like to go back on medication.   Wondering if he needs to titrate back on the '10mg'$  po BID. Would like message sent to covering MD since Dr. Brett Fairy out.  Pharmacy: [facility] # [address] [place]  I spoke with Dr. Felecia Shelling. Would need to repeat Namenda titration pack. I e-scribed to pharmacy. Called wife back. Relayed message. She will pick up from pharmacy. She will also see about r/o UTI. He has hx UTI. Will discuss w/ PCP. Aware this can cause increased confusion/agitation as well.

## 2021-10-12 NOTE — Progress Notes (Deleted)
Synopsis: Referred for acute cough by Prince Solian, MD  Subjective:   PATIENT ID: Donald [name] GENDER: male DOB: 1951-01-02, MRN: [id number]  No chief complaint on file.  69yM with history of UACS vs asthma followed in our clinic last seen 2016, GERD, sleep-disordered breathing on CPAP and O2, referred for acute cough.  Saw ENT at [facility] 2016, they felt cough/arytenoid edema caused by GERD, LPR.  He has had cough which he thinks is related to something in his throat. He has had neck surgery in past for herniated disks - 10 years ago. He also has elevated left collarbone related to prior injury and healing and still wonders if it's related to his cough. It is not productive. There is positional component to his cough where if he flexes his neck it's worse. He has no trouble swallowing. No real trouble with choking/aspirating on food/liquids.   He does think a prednisone course helped a little bit.  He is still taking protonix 40 mg once daily in AM right before breakfast.   Very frequent sensation of PND. Doesn't feel sinonasal congestion. Had deviated septum surgery in 1980s/90s.  He has mild DOE which he attributes to asthma - has not worsened since visit in 2016. Takes albuterol intermittently.   No family history of lung disease  He has secondhand smoke exposure.   He was a Dance movement psychotherapist, worked with Veterinary surgeon. 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 Had frequent dust/particulate exposure without mask back then (last in 1981). Has beagle at home. No pet birds. He has no hot tub. Lived in [place] time, [place].  Interval HPI: Flonase, protonix for cough.  Otherwise pertinent review of systems is negative.  Past Medical History:  Diagnosis Date   Allergic rhinitis, seasonal    Asthma    Bronchitis    Chronic kidney disease, stage I    collar bone    dislocation left side 05/2014   Degenerative joint disease of knee    bilateral   Diabetes  mellitus    GERD (gastroesophageal reflux disease)    Heart murmur    history of   History of acute prostatitis    History of hematuria    Hyperlipidemia    Hypertension    IgA nephropathy    OSA on CPAP    Pneumonia    Sinusitis      Family History  Problem Relation Age of Onset   Coronary artery disease Father    Diabetes type II Father    Heart attack Father      Past Surgical History:  Procedure Laterality Date   BIOPSY  05/17/2020   Procedure: BIOPSY;  Surgeon: Clarene Essex, MD;  Location: [facility];  Service: Endoscopy;;   CARDIAC CATHETERIZATION  09/29/2011   normal   CERVICAL SPINE SURGERY     COLONOSCOPY WITH PROPOFOL N/A 05/17/2020   Procedure: COLONOSCOPY WITH PROPOFOL;  Surgeon: Clarene Essex, MD;  Location: [facility];  Service: Endoscopy;  Laterality: N/A;   deviated septum  1930s   ESOPHAGOGASTRODUODENOSCOPY (EGD) WITH PROPOFOL N/A 05/17/2020   Procedure: ESOPHAGOGASTRODUODENOSCOPY (EGD) WITH PROPOFOL;  Surgeon: Clarene Essex, MD;  Location: [facility];  Service: Endoscopy;  Laterality: N/A;   HEMOSTASIS CLIP PLACEMENT  05/17/2020   Procedure: HEMOSTASIS CLIP PLACEMENT;  Surgeon: Clarene Essex, MD;  Location: [facility];  Service: Endoscopy;;   HERNIA REPAIR     LEFT HEART CATHETERIZATION WITH CORONARY ANGIOGRAM N/A 09/29/2011   Procedure: LEFT HEART CATHETERIZATION  WITH CORONARY ANGIOGRAM;  Surgeon: Sanda Klein, MD;  Location: [facility] CATH LAB;  Service: Cardiovascular;  Laterality: N/A;   NM MYOVIEW LTD  07/08/2011   mild LV dilatation,mild septal hypokinesia   POLYPECTOMY  05/17/2020   Procedure: POLYPECTOMY;  Surgeon: Clarene Essex, MD;  Location: [facility];  Service: Endoscopy;;   TONSILLECTOMY     US ECHOCARDIOGRAPHY  09/07/2011   EF 35-45%,mod.ant hypokinesis,boderline LA & RA enlargement,trace MR,TR & PI    Social History   Socioeconomic History   Marital status: Married    Spouse name: Diane   Number of children: 1   Years of education: 14   Highest  education level: Not on file  Occupational History   Occupation: Herbalist: Interior and spatial designer  Tobacco Use   Smoking status: Never   Smokeless tobacco: Never   Tobacco comments:    Second hand smoke for 26 years per pt  Vaping Use   Vaping Use: Never used  Substance and Sexual Activity   Alcohol use: No    Alcohol/week: 0.0 standard drinks   Drug use: No   Sexual activity: Not on file  Other Topics Concern   Not on file  Social History Narrative   Patient is married (Diane) and lives at home with his wife.   Patient has one child.   Patient is working full-time.   Patient has a Geophysicist/field seismologist.   Patient is left-handed.   Patient drinks one glass of tea daily, one soda daily.   Social Determinants of Health   Financial Resource Strain: Not on file  Food Insecurity: Not on file  Transportation Needs: Not on file  Physical Activity: Not on file  Stress: Not on file  Social Connections: Not on file  Intimate Partner Violence: Not on file     Allergies  Allergen Reactions   Ciprofloxacin     Tendons problem in shoulder Other reaction(s): Arthralgias (intolerance), Myalgias (intolerance)   Empagliflozin Anaphylaxis    Dehydration   Levofloxacin     Other reaction(s): Arthralgias (intolerance), Myalgias (intolerance)   Aricept [Donepezil Hcl] Nausea Only   Bactrim [Sulfamethoxazole-Trimethoprim] Hives   Levofloxacin Other (See Comments)    Muscle tighten up in left and right calf   Misc. Sulfonamide Containing Compounds Rash     Outpatient Medications Prior to Visit  Medication Sig Dispense Refill   albuterol (PROVENTIL HFA;VENTOLIN HFA) 108 (90 BASE) MCG/ACT inhaler Inhale 2 puffs into the lungs every 6 (six) hours as needed for wheezing or shortness of breath. For shortness of breath (Patient not taking: Reported on 08/17/2021)     aspirin EC 81 MG tablet See admin instructions. (Patient not taking: Reported on 08/17/2021)     Continuous Blood Gluc  Sensor (FREESTYLE LIBRE 14 DAY SENSOR) MISC      Cyanocobalamin (VITAMIN B12) 500 MCG TABS Take 500 mcg by mouth daily.     DULoxetine (CYMBALTA) 30 MG capsule Take 90 mg by mouth daily.     finasteride (PROSCAR) 5 MG tablet Take 5 mg by mouth daily.     furosemide (LASIX) 20 MG tablet Take 1 tablet (20 mg total) by mouth daily in the afternoon. 90 tablet 3   HYDROcodone-acetaminophen (NORCO/VICODIN) 5-325 MG tablet Take one tablet by mouth every 6 hours for severe pain Dx: R07.51     Insulin Aspart FlexPen (NOVOLOG) 100 UNIT/ML Inject 40-60 Units into the skin as needed.     insulin degludec (TRESIBA FLEXTOUCH) 200 UNIT/ML FlexTouch Pen Inject  90 Units into the skin daily in the afternoon.     Insulin Pen Needle (UNIFINE PENTIPS PLUS) 32G X 4 MM MISC USE WITH TRESIBA DAILY     loratadine (CLARITIN) 10 MG tablet Take 10 mg by mouth daily.     memantine (NAMENDA TITRATION PAK) tablet pack 5 mg/day for =1 week; 5 mg twice daily for =1 week; 15 mg/day given in 5 mg and 10 mg separated doses for =1 week; then 10 mg twice daily 49 tablet 0   metFORMIN (GLUCOPHAGE) 1000 MG tablet Take 1 tablet (1,000 mg total) by mouth 2 (two) times daily with a meal. Restart on 8/22     metoprolol succinate (TOPROL-XL) 50 MG 24 hr tablet Take 50 mg by mouth 2 (two) times daily. Take with or immediately following a meal.     Multiple Vitamins-Minerals (CENTRUM SILVER 50+MEN PO) Take 1 tablet by mouth daily in the afternoon.     Omega-3 Fatty Acids (FISH OIL) 1000 MG CPDR Take 2,000 Units by mouth daily.     pantoprazole (PROTONIX) 40 MG tablet Take 40 mg by mouth daily.     rosuvastatin (CRESTOR) 20 MG tablet Take 1 tablet (20 mg total) by mouth daily. 90 tablet 3   sacubitril-valsartan (ENTRESTO) 97-103 MG Take 1 tablet by mouth 2 (two) times daily. 180 tablet 1   silodosin (RAPAFLO) 8 MG CAPS capsule Take 8 mg by mouth daily.     spironolactone (ALDACTONE) 25 MG tablet Take 1 tablet (25 mg total) by mouth daily. 90  tablet 3   No facility-administered medications prior to visit.       Objective:   Physical Exam:  General appearance: 71 y.o., male, NAD, conversant  Eyes: anicteric sclerae; PERRL, tracking appropriately HENT: NCAT; MMM Neck: Trachea midline; no lymphadenopathy, no JVD Lungs: CTAB, no crackles, no wheeze, with normal respiratory effort CV: RRR, no murmur  Abdomen: Soft, non-tender; non-distended, BS present  Extremities: No peripheral edema, warm Skin: Normal turgor and texture; no rash Psych: Appropriate affect Neuro: Alert and oriented to person and place, no focal deficit     There were no vitals filed for this visit.     on RA BMI Readings from Last 3 Encounters:  08/17/21 29.59 kg/m  07/19/21 29.89 kg/m  07/08/21 29.27 kg/m   Wt Readings from Last 3 Encounters:  08/17/21 200 lb 6.4 oz (90.9 kg)  07/19/21 202 lb 6.4 oz (91.8 kg)  07/08/21 198 lb 3.2 oz (89.9 kg)     CBC    Component Value Date/Time   WBC 10.5 03/27/2021 2320   RBC 5.28 03/27/2021 2320   HGB 13.1 03/27/2021 2320   HGB 13.4 10/18/2020 1033   HCT 42.4 03/27/2021 2320   HCT 42.5 10/18/2020 1033   PLT 290 03/27/2021 2320   PLT 309 10/18/2020 1033   MCV 80.3 03/27/2021 2320   MCV 82 10/18/2020 1033   MCH 24.8 (L) 03/27/2021 2320   MCHC 30.9 03/27/2021 2320   RDW 17.2 (H) 03/27/2021 2320   RDW 16.7 (H) 10/18/2020 1033   LYMPHSABS 1.9 10/18/2020 1033   MONOABS 0.7 12/25/2018 1500   EOSABS 0.4 10/18/2020 1033   BASOSABS 0.1 10/18/2020 1033    Low level eosinophilia historically Broad autoimmune panel, HP panel negative in 2016  Chest Imaging:   CXR 2020 reviewed by me and unremarkable  CT A/P 2020 reviewed by me, lung bases unremarkable  CT Chest 2016 reviewed by me and unremarkable  HRCT 02/14/21 reviewed by  me with subtle air trapping  Pulmonary Functions Testing Results:    Latest Ref Rng & Units 02/11/2021   11:56 AM 06/17/2014    2:21 PM  PFT Results  FVC-Pre L  2.81   3.30    FVC-Predicted Pre % 67   75    FVC-Post L 3.04   3.49    FVC-Predicted Post % 73   79    Pre FEV1/FVC % % 73   73    Post FEV1/FCV % % 76   77    FEV1-Pre L 2.05   2.42    FEV1-Predicted Pre % 66   73    FEV1-Post L 2.32   2.69    DLCO uncorrected ml/min/mmHg 19.80   21.18    DLCO UNC% % 80   71    DLCO corrected ml/min/mmHg 19.80     DLCO COR %Predicted % 80     DLVA Predicted % 100   94    TLC L 4.54   5.18    TLC % Predicted % 68   78    RV % Predicted % 78   83     Mild restriction without air trapping and with borderline bronchodilator response  CPET 07/2014: Stopped due to dyspnea and leg fatigue MVV 88 (67% pred) pre exercise Peak vo2 just under normal for ibw (84% ibw pred) HRR 7 bpm  Peak HR 151 Peak RR 38 Peak V: 69.5 BR of 18.5 Ve/MVV: 79%   Echocardiogram:   TTE 09/2011 with EF 35-45%      Assessment & Plan:   # Acute on chronic cough: # Mild restrictive ventilatory defect, borderline BD response: # GERD/LPR Etiology of cough may be multifactorial - regardless he's doing better at this visit. Consider UACS/PND which sounds prominent currently, LPR/GERD, less likely longstanding asthma (does have borderline BD response). Restriction may be due to combination of habitus and volume overload at time of PFT.  Plan: - flonase for at least a few weeks when has postnasal drainage - keep taking protonix everyday in morning 30 minutes before eating or 30 min before dinner in evening - happy to see him again if bothered by cough despite these measures for consideration of therapeutic trial of ICS/LABA given less likely possibility of asthma (essentially +BD response on PFT)    RTC prn  Maryjane Hurter, MD [facility] 10/12/2021 1:13 PM

## 2021-10-13 ENCOUNTER — Ambulatory Visit: Payer: HMO | Admitting: Student

## 2021-10-19 DIAGNOSIS — N401 Enlarged prostate with lower urinary tract symptoms: Secondary | ICD-10-CM | POA: Diagnosis not present

## 2021-10-19 DIAGNOSIS — R3912 Poor urinary stream: Secondary | ICD-10-CM | POA: Diagnosis not present

## 2021-10-20 NOTE — Progress Notes (Signed)
Synopsis: Referred for acute cough by Prince Solian, MD  Subjective:   PATIENT ID: Donald [name] GENDER: male DOB: 28-Sep-1940, MRN: [id number]  Chief Complaint  Patient presents with   Acute Visit    Cough has been worse since the last visit- occ prod with "cloudy grey" sputum. He states he feels like his vocal cords are coated with something and he feels the need to clear his throat often.    71yM with history of UACS vs asthma followed in our clinic last seen 2016, GERD, sleep-disordered breathing on CPAP and O2, referred for acute cough.  Saw ENT at [facility] 2016, they felt cough/arytenoid edema caused by GERD, LPR.  He has had cough which he thinks is related to something in his throat. He has had neck surgery in past for herniated disks - 10 years ago. He also has elevated left collarbone related to prior injury and healing and still wonders if it's related to his cough. It is not productive. There is positional component to his cough where if he flexes his neck it's worse. He has no trouble swallowing. No real trouble with choking/aspirating on food/liquids.   He does think a prednisone course helped a little bit.  He is still taking protonix 40 mg once daily in AM right before breakfast.   Very frequent sensation of PND. Doesn't feel sinonasal congestion. Had deviated septum surgery in 1980s/90s.  He has mild DOE which he attributes to asthma - has not worsened since visit in 2016. Takes albuterol intermittently.   No family history of lung disease  He has secondhand smoke exposure.   He was a Dance movement psychotherapist, worked with Veterinary surgeon. 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 Had frequent dust/particulate exposure without mask back then (last in 1981). Has beagle at home. No pet birds. He has no hot tub. Lived in [place] for some time, [place].  Interval HPI: Flonase, protonix attempted for cough at last visit.  He thinks the cough is probably worse or at  least as bad as it was at last visit. Dry cough, feels throat irritation and like he needs to clear throat. He does not have sinus congestion/postnasal drainage but he is trying allegra.   Greater DOE than at last visit.   Otherwise pertinent review of systems is negative.  Past Medical History:  Diagnosis Date   Allergic rhinitis, seasonal    Asthma    Bronchitis    Chronic kidney disease, stage I    collar bone    dislocation left side 05/2014   Degenerative joint disease of knee    bilateral   Diabetes mellitus    GERD (gastroesophageal reflux disease)    Heart murmur    history of   History of acute prostatitis    History of hematuria    Hyperlipidemia    Hypertension    IgA nephropathy    OSA on CPAP    Pneumonia    Sinusitis      Family History  Problem Relation Age of Onset   Coronary artery disease Father    Diabetes type II Father    Heart attack Father      Past Surgical History:  Procedure Laterality Date   BIOPSY  05/17/2020   Procedure: BIOPSY;  Surgeon: Clarene Essex, MD;  [facility]: [facility];  Service: Endoscopy;;   CARDIAC CATHETERIZATION  09/29/2011   normal   CERVICAL SPINE SURGERY     COLONOSCOPY WITH PROPOFOL N/A 05/17/2020  Procedure: COLONOSCOPY WITH PROPOFOL;  Surgeon: Clarene Essex, MD;  Location: [facility];  Service: Endoscopy;  Laterality: N/A;   deviated septum  1930s   ESOPHAGOGASTRODUODENOSCOPY (EGD) WITH PROPOFOL N/A 05/17/2020   Procedure: ESOPHAGOGASTRODUODENOSCOPY (EGD) WITH PROPOFOL;  Surgeon: Clarene Essex, MD;  Location: [facility];  Service: Endoscopy;  Laterality: N/A;   HEMOSTASIS CLIP PLACEMENT  05/17/2020   Procedure: HEMOSTASIS CLIP PLACEMENT;  Surgeon: Clarene Essex, MD;  [facility]: [facility];  Service: Endoscopy;;   HERNIA REPAIR     LEFT HEART CATHETERIZATION WITH CORONARY ANGIOGRAM N/A 09/29/2011   Procedure: LEFT HEART CATHETERIZATION WITH CORONARY ANGIOGRAM;  Surgeon: Sanda Klein, MD;  Location: [facility] CATH LAB;  Service:  Cardiovascular;  Laterality: N/A;   NM MYOVIEW LTD  07/08/2011   mild LV dilatation,mild septal hypokinesia   POLYPECTOMY  05/17/2020   Procedure: POLYPECTOMY;  Surgeon: Clarene Essex, MD;  [facility]: [facility];  Service: Endoscopy;;   TONSILLECTOMY     US ECHOCARDIOGRAPHY  09/07/2011   EF 35-45%,mod.ant hypokinesis,boderline LA & RA enlargement,trace MR,TR & PI    Social History   Socioeconomic History   Marital status: Married    Spouse name: Diane   Number of children: 1   Years of education: 14   Highest education level: Not on file  Occupational History   Occupation: Herbalist: Interior and spatial designer  Tobacco Use   Smoking status: Never   Smokeless tobacco: Never   Tobacco comments:    Second hand smoke for 26 years per pt  Vaping Use   Vaping Use: Never used  Substance and Sexual Activity   Alcohol use: No    Alcohol/week: 0.0 standard drinks of alcohol   Drug use: No   Sexual activity: Not on file  Other Topics Concern   Not on file  Social History Narrative   Patient is married (Diane) and lives at home with his wife.   Patient has one child.   Patient is working full-time.   Patient has a Geophysicist/field seismologist.   Patient is left-handed.   Patient drinks one glass of tea daily, one soda daily.   Social Determinants of Health   Financial Resource Strain: Not on file  Food Insecurity: Not on file  Transportation Needs: Not on file  Physical Activity: Not on file  Stress: Not on file  Social Connections: Not on file  Intimate Partner Violence: Not on file     Allergies  Allergen Reactions   Ciprofloxacin     Tendons problem in shoulder Other reaction(s): Arthralgias (intolerance), Myalgias (intolerance)   Empagliflozin Anaphylaxis    Dehydration   Levofloxacin     Other reaction(s): Arthralgias (intolerance), Myalgias (intolerance)   Aricept [Donepezil Hcl] Nausea Only   Bactrim [Sulfamethoxazole-Trimethoprim] Hives   Levofloxacin Other (See  Comments)    Muscle tighten up in left and right calf   Misc. Sulfonamide Containing Compounds Rash     Outpatient Medications Prior to Visit  Medication Sig Dispense Refill   albuterol (PROVENTIL HFA;VENTOLIN HFA) 108 (90 BASE) MCG/ACT inhaler Inhale 2 puffs into the lungs every 6 (six) hours as needed for wheezing or shortness of breath. For shortness of breath     Continuous Blood Gluc Sensor (FREESTYLE LIBRE 14 DAY SENSOR) MISC      Cyanocobalamin (VITAMIN B12) 500 MCG TABS Take 500 mcg by mouth daily.     DULoxetine (CYMBALTA) 30 MG capsule Take 90 mg by mouth daily.     finasteride (PROSCAR) 5 MG tablet Take  5 mg by mouth daily.     furosemide (LASIX) 20 MG tablet Take 1 tablet (20 mg total) by mouth daily in the afternoon. 90 tablet 3   HYDROcodone-acetaminophen (NORCO/VICODIN) 5-325 MG tablet Take one tablet by mouth every 6 hours for severe pain Dx: R07.51     Insulin Aspart FlexPen (NOVOLOG) 100 UNIT/ML Inject 40-60 Units into the skin as needed.     insulin degludec (TRESIBA FLEXTOUCH) 200 UNIT/ML FlexTouch Pen Inject 90 Units into the skin daily in the afternoon.     Insulin Pen Needle (UNIFINE PENTIPS PLUS) 32G X 4 MM MISC USE WITH TRESIBA DAILY     loratadine (CLARITIN) 10 MG tablet Take 10 mg by mouth daily.     memantine (NAMENDA TITRATION PAK) tablet pack 5 mg/day for =1 week; 5 mg twice daily for =1 week; 15 mg/day given in 5 mg and 10 mg separated doses for =1 week; then 10 mg twice daily 49 tablet 0   metFORMIN (GLUCOPHAGE) 1000 MG tablet Take 1 tablet (1,000 mg total) by mouth 2 (two) times daily with a meal. Restart on 8/22     metoprolol succinate (TOPROL-XL) 50 MG 24 hr tablet Take 50 mg by mouth 2 (two) times daily. Take with or immediately following a meal.     Multiple Vitamins-Minerals (CENTRUM SILVER 50+MEN PO) Take 1 tablet by mouth daily in the afternoon.     Omega-3 Fatty Acids (FISH OIL) 1000 MG CPDR Take 2,000 Units by mouth daily.     pantoprazole (PROTONIX)  40 MG tablet Take 40 mg by mouth daily.     sacubitril-valsartan (ENTRESTO) 97-103 MG Take 1 tablet by mouth 2 (two) times daily. 180 tablet 1   silodosin (RAPAFLO) 8 MG CAPS capsule Take 8 mg by mouth daily.     rosuvastatin (CRESTOR) 20 MG tablet Take 1 tablet (20 mg total) by mouth daily. 90 tablet 3   spironolactone (ALDACTONE) 25 MG tablet Take 1 tablet (25 mg total) by mouth daily. 90 tablet 3   aspirin EC 81 MG tablet See admin instructions.     No facility-administered medications prior to visit.       Objective:   Physical Exam:  General appearance: 71 y.o., male, NAD, conversant  Eyes: anicteric sclerae; PERRL, tracking appropriately HENT: NCAT; MMM Neck: Trachea midline; no lymphadenopathy, no JVD Lungs: CTAB, no crackles, no wheeze, with normal respiratory effort CV: RRR, no murmur  Abdomen: Soft, non-tender; non-distended, BS present  Extremities: No peripheral edema, warm Skin: Normal turgor and texture; no rash Psych: Appropriate affect Neuro: Alert and oriented to person and place, no focal deficit     Vitals:   10/21/21 0917  BP: 90/60  Pulse: 80  Temp: 97.6 F (36.4 C)  TempSrc: Oral  SpO2: 96%  Weight: 200 lb (90.7 kg)  Height: _0  (1.753 m)     96% on RA BMI Readings from Last 3 Encounters:  10/21/21 29.53 kg/m  08/17/21 29.59 kg/m  07/19/21 29.89 kg/m   Wt Readings from Last 3 Encounters:  10/21/21 200 lb (90.7 kg)  08/17/21 200 lb 6.4 oz (90.9 kg)  07/19/21 202 lb 6.4 oz (91.8 kg)     CBC    Component Value Date/Time   WBC 10.5 03/27/2021 2320   RBC 5.28 03/27/2021 2320   HGB 13.1 03/27/2021 2320   HGB 13.4 10/18/2020 1033   HCT 42.4 03/27/2021 2320   HCT 42.5 10/18/2020 1033   PLT 290 03/27/2021 2320  PLT 309 10/18/2020 1033   MCV 80.3 03/27/2021 2320   MCV 82 10/18/2020 1033   MCH 24.8 (L) 03/27/2021 2320   MCHC 30.9 03/27/2021 2320   RDW 17.2 (H) 03/27/2021 2320   RDW 16.7 (H) 10/18/2020 1033   LYMPHSABS 1.9  10/18/2020 1033   MONOABS 0.7 12/25/2018 1500   EOSABS 0.4 10/18/2020 1033   BASOSABS 0.1 10/18/2020 1033    Low level eosinophilia historically Broad autoimmune panel, HP panel negative in 2016  Chest Imaging:   CXR 2020 reviewed by me and unremarkable  CT A/P 2020 reviewed by me, lung bases unremarkable  CT Chest 2016 reviewed by me and unremarkable  HRCT 02/14/21 reviewed by me with subtle air trapping  CT Coronaries 05/11/21 reviewed by me with atelectasis lung bases  Pulmonary Functions Testing Results:    Latest Ref Rng & Units 02/11/2021   11:56 AM 06/17/2014    2:21 PM  PFT Results  FVC-Pre L 2.81  3.30   FVC-Predicted Pre % 67  75   FVC-Post L 3.04  3.49   FVC-Predicted Post % 73  79   Pre FEV1/FVC % % 73  73   Post FEV1/FCV % % 76  77   FEV1-Pre L 2.05  2.42   FEV1-Predicted Pre % 66  73   FEV1-Post L 2.32  2.69   DLCO uncorrected ml/min/mmHg 19.80  21.18   DLCO UNC% % 80  71   DLCO corrected ml/min/mmHg 19.80    DLCO COR %Predicted % 80    DLVA Predicted % 100  94   TLC L 4.54  5.18   TLC % Predicted % 68  78   RV % Predicted % 78  83    Mild restriction without air trapping and with borderline bronchodilator response  CPET 07/2014: Stopped due to dyspnea and leg fatigue MVV 88 (67% pred) pre exercise Peak vo2 just under normal for ibw (84% ibw pred) HRR 7 bpm  Peak HR 151 Peak RR 38 Peak V: 69.5 BR of 18.5 Ve/MVV: 79%   Echocardiogram:   TTE 09/2011 with EF 35-45%      Assessment & Plan:   # Acute on chronic cough: # Mild restrictive ventilatory defect, borderline BD response: # GERD/LPR Etiology of cough may be multifactorial - regardless he's doing better at this visit. I still feel cough by history (and prior FNL exam in 2016) is most consistent with LPR/GERD, less likely longstanding asthma (does have borderline BD response). Alternatively 9% of patients in entresto arm of PARADIGM-HF trial did develop cough. Restriction may be due to  combination of habitus and volume overload at time of PFT.  Plan: - flonase for at least a few weeks when has postnasal drainage, continue allegra - keep taking protonix everyday in morning 30 minutes before eating or 30 min before dinner in evening - reinforced GERD lifestyle measures. He will try to stop eating two large scoops of chocolate ice cream with soda right before bed for at least the next 8 weeks. - offered trial of LABA/ICS but they want to try to implement GERD lifestyle measures first. I'll ask pharmacy to see which LABA/ICS is most affordable in case we decide to trial it at next visit - if unresponsive to intensification of lifestyle measures for GERD, trial of LABA/ICS may need to consider alternative GDMT to entresto if cough is bothering him this much    RTC 8 weeks  Maryjane Hurter, MD [facility] 10/21/2021  9:52 AM

## 2021-10-21 ENCOUNTER — Ambulatory Visit: Payer: HMO | Admitting: Student

## 2021-10-21 ENCOUNTER — Ambulatory Visit: Payer: HMO

## 2021-10-21 ENCOUNTER — Encounter: Payer: Self-pay | Admitting: Student

## 2021-10-21 VITALS — BP 90/60 | HR 80 | Temp 97.6°F | Ht 69.0 in | Wt 200.0 lb

## 2021-10-21 DIAGNOSIS — R053 Chronic cough: Secondary | ICD-10-CM | POA: Diagnosis not present

## 2021-10-21 DIAGNOSIS — R0609 Other forms of dyspnea: Secondary | ICD-10-CM

## 2021-10-21 NOTE — Patient Instructions (Addendum)
-   I will reach out to pharmacy to see which inhaler is least expensive for you to try and I'll let you know how to use it once I hear back

## 2021-10-27 NOTE — Telephone Encounter (Signed)
Pt wife would like a call to discuss if apt 11/02/2021 is still needed, due to Nam[employer] titration pack helping pt in last few weeks.

## 2021-10-27 NOTE — Telephone Encounter (Addendum)
Called wife back. Advised if he was doing better, no need for appt. However, she spoke with husband after she LVM for our office. He is requesting update on cpap/getting oxygen.   He was set up with new cpap machine (after getting set up with incorrect machine) about a month ago w/ [facility]. States they asked about getting order for oxygen beginning of April but never heard back. Aware I will speak with Myriam Jacobson, RN to see if she got update.  Wondering if he needs to schedule another 31-90 day initial cpap f/u for new machine as well.   After reviewing chart, looks like he had a Luna machine. Unable to pull data off [facility] airview. Sent email to Ashley/[employer] to see what cpap he was set up with a month ago. Waiting on response.

## 2021-10-27 NOTE — Telephone Encounter (Signed)
Per Caryl Pina, confirmed he was set up with [facility]med. Tagged our office so we can now view online.Confirmed this.  Waiting on response from [facility] on update about oxygen.

## 2021-10-27 NOTE — Telephone Encounter (Addendum)
Called wife back. Relayed that we were able to confirm with [employer] that he was set up w/ [facility] and we can see data. We are waiting on update from [employer] about oxygen question.  Does not need another initial cpap f/u with [facility]med since he was compliant on luna w/ that initial cpap f/u.  Aware we will call back once we hear from christina about oxygen question.  Cx appt 11/02/21. They did not need.

## 2021-11-01 NOTE — Telephone Encounter (Signed)
Called the patient's wife back. There was no answer. Left a message advising that we would have to repeat testing for medicare purposes to get the oxygen ordered. I will send a response to the pt on mychart as well and advised to call back with questions.

## 2021-11-01 NOTE — Telephone Encounter (Signed)
Contacted the management team with [employer] to see if the previous testing that was completed that showed oxygen need would be sufficient.  The response received was, "Unfortunately - the testing is too old. MCR and MCR replacements require recent testing."  Will notify pt.

## 2021-11-02 ENCOUNTER — Ambulatory Visit: Payer: HMO | Admitting: Neurology

## 2021-11-04 ENCOUNTER — Telehealth: Payer: Self-pay | Admitting: Cardiovascular Disease

## 2021-11-04 NOTE — Telephone Encounter (Signed)
Returned the call to the patient's wife. The wife stated that the patient's oxygen level drops at night and they would like a concentrator for the patient. She has been advised to refer to his pulmonologist for this. She has verbalized her understanding.

## 2021-11-04 NOTE — Telephone Encounter (Signed)
Pt's wife is requesting a call back to discuss an oxygen concentrate they would like to get approved through Dr. Kathlene Cote so they can have it approved through [facility].

## 2021-11-05 ENCOUNTER — Encounter: Payer: Self-pay | Admitting: Neurology

## 2021-11-05 ENCOUNTER — Encounter: Payer: Self-pay | Admitting: Student

## 2021-11-07 ENCOUNTER — Other Ambulatory Visit: Payer: Self-pay | Admitting: *Deleted

## 2021-11-07 MED ORDER — MEMANTINE HCL 10 MG PO TABS: 10.0000 mg | ORAL_TABLET | Freq: Two times a day (BID) | ORAL | 11 refills | Status: DC

## 2021-11-07 NOTE — Telephone Encounter (Signed)
Dr. Verlee Monte please advise on the following My Chart message:    I am wanting to get an oxygen concentrator & 02 tanks through [place].  Medicare is asking for a request from the doctor, since the sleep study that diagnosed me with needing oxygen was done many years ago.   Dr. Brett Fairy could request another sleep study, but I am hoping to not have to go through that again. She suggested that we contact you to see if you would request that I can get oxygen service. I currently have my own oxygen concentrator, which I use during naps and overnight. I would prefer to go with [place] so that I can get tanks in case of power outages. Currently I have no way to have oxygen if we have no power. Thank you for your attention, Donald [name]   Thank you

## 2021-11-09 DIAGNOSIS — E785 Hyperlipidemia, unspecified: Secondary | ICD-10-CM | POA: Diagnosis not present

## 2021-11-09 DIAGNOSIS — E118 Type 2 diabetes mellitus with unspecified complications: Secondary | ICD-10-CM | POA: Diagnosis not present

## 2021-11-09 DIAGNOSIS — Z125 Encounter for screening for malignant neoplasm of prostate: Secondary | ICD-10-CM | POA: Diagnosis not present

## 2021-11-14 DIAGNOSIS — Z0000 Encounter for general adult medical examination without abnormal findings: Secondary | ICD-10-CM | POA: Diagnosis not present

## 2021-11-15 ENCOUNTER — Encounter: Payer: Self-pay | Admitting: Cardiovascular Disease

## 2021-11-16 DIAGNOSIS — I5042 Chronic combined systolic (congestive) and diastolic (congestive) heart failure: Secondary | ICD-10-CM | POA: Diagnosis not present

## 2021-11-16 DIAGNOSIS — R1011 Right upper quadrant pain: Secondary | ICD-10-CM | POA: Diagnosis not present

## 2021-11-16 DIAGNOSIS — N182 Chronic kidney disease, stage 2 (mild): Secondary | ICD-10-CM | POA: Diagnosis not present

## 2021-11-16 DIAGNOSIS — R413 Other amnesia: Secondary | ICD-10-CM | POA: Diagnosis not present

## 2021-11-16 DIAGNOSIS — I129 Hypertensive chronic kidney disease with stage 1 through stage 4 chronic kidney disease, or unspecified chronic kidney disease: Secondary | ICD-10-CM | POA: Diagnosis not present

## 2021-11-16 DIAGNOSIS — J45909 Unspecified asthma, uncomplicated: Secondary | ICD-10-CM | POA: Diagnosis not present

## 2021-11-16 DIAGNOSIS — F39 Unspecified mood [affective] disorder: Secondary | ICD-10-CM | POA: Diagnosis not present

## 2021-11-16 DIAGNOSIS — E1122 Type 2 diabetes mellitus with diabetic chronic kidney disease: Secondary | ICD-10-CM | POA: Diagnosis not present

## 2021-11-16 DIAGNOSIS — Z23 Encounter for immunization: Secondary | ICD-10-CM | POA: Diagnosis not present

## 2021-11-16 DIAGNOSIS — E118 Type 2 diabetes mellitus with unspecified complications: Secondary | ICD-10-CM | POA: Diagnosis not present

## 2021-11-16 DIAGNOSIS — Z13828 Encounter for screening for other musculoskeletal disorder: Secondary | ICD-10-CM | POA: Diagnosis not present

## 2021-11-16 DIAGNOSIS — R0902 Hypoxemia: Secondary | ICD-10-CM | POA: Diagnosis not present

## 2021-11-16 DIAGNOSIS — E785 Hyperlipidemia, unspecified: Secondary | ICD-10-CM | POA: Diagnosis not present

## 2021-11-16 DIAGNOSIS — Z0000 Encounter for general adult medical examination without abnormal findings: Secondary | ICD-10-CM | POA: Diagnosis not present

## 2021-11-16 DIAGNOSIS — R82998 Other abnormal findings in urine: Secondary | ICD-10-CM | POA: Diagnosis not present

## 2021-11-16 DIAGNOSIS — M170 Bilateral primary osteoarthritis of knee: Secondary | ICD-10-CM | POA: Diagnosis not present

## 2021-11-16 NOTE — Telephone Encounter (Signed)
Dr. Verlee Monte, please advise on pt's email. He uses O2 while sleeping. Pt has a home concentrator that he purchased but is requesting to now use [employer] with a new home concentrator and tanks for power outages.

## 2021-11-21 ENCOUNTER — Other Ambulatory Visit: Payer: Self-pay | Admitting: Neurology

## 2021-11-21 DIAGNOSIS — G4733 Obstructive sleep apnea (adult) (pediatric): Secondary | ICD-10-CM

## 2021-11-21 DIAGNOSIS — G4734 Idiopathic sleep related nonobstructive alveolar hypoventilation: Secondary | ICD-10-CM

## 2021-11-21 NOTE — Telephone Encounter (Signed)
How can we help him change DME suppliers, get new home concentrator and tanks for power outages? Is that combination possible?  Thanks!

## 2021-11-24 ENCOUNTER — Encounter: Payer: Self-pay | Admitting: Psychology

## 2021-11-24 ENCOUNTER — Encounter: Payer: HMO | Attending: Psychology | Admitting: Psychology

## 2021-11-24 DIAGNOSIS — F0670 Mild neurocognitive disorder due to known physiological condition without behavioral disturbance: Secondary | ICD-10-CM | POA: Diagnosis not present

## 2021-11-24 DIAGNOSIS — G4733 Obstructive sleep apnea (adult) (pediatric): Secondary | ICD-10-CM | POA: Diagnosis not present

## 2021-11-24 DIAGNOSIS — R0902 Hypoxemia: Secondary | ICD-10-CM | POA: Diagnosis not present

## 2021-11-24 DIAGNOSIS — Z9989 Dependence on other enabling machines and devices: Secondary | ICD-10-CM

## 2021-11-24 DIAGNOSIS — N183 Chronic kidney disease, stage 3 unspecified: Secondary | ICD-10-CM | POA: Diagnosis not present

## 2021-11-24 NOTE — Progress Notes (Signed)
11/24/2021 2 PM-4 PM: Today's visit was an in person visit that was conducted in my outpatient clinic office.  This was a follow-up assessment after the patient was seen little bit over a year ago by Dr. Darol Destine for neuropsychological evaluation and this is for repeat testing.  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  The patient also has a history of obstructive sleep apnea and had had previous falls but denied any tremors or other significant changes in gait or balance.  There were no descriptions of any visual hallucinations or delusions etc.  Today a new clinical interview was conducted with the patient and his wife.  The patient reported that he has had some worsening of his overall symptoms and feels like he is having more frequent times where he has difficulty remembering people's names or coming up with the word that he wants to find.  He reports that he has also had times where he has had said the wrong word or set a word that had a different meaning.  The patient reports significant attentional issues with some worsening and difficulties keeping up with instructions and difficulty with increased distractibility.  The patient reports that he has adapted some and is expecting to make these errors now and is being more aware and cautious when both speaking or when he is in distracting situations.  The patient reports that he is still regularly using his CPAP machine with constant use and also uses oxygen as well.  The patient has less of an obstructive apneic event but more of pausing while breathing and suppressed breathing rate and uses oxygen.  He has had times where his oxygen levels got down to 77 %.  The patient's wife reports that the  patient is becoming more open about telling her when he forgets things and she has noted significant distractibility continues in she perceives it to be somewhat worse than 1 year ago.  She reports that he cannot do 2 things at once now like he used to be.  The patient has been a Dance movement psychotherapist and was able to be actively engaged in multiple things but now gets too distracted and has to do 1 thing at a time.  She reports that if they follow a very programmatic routine that this helps with him staying on task and the patient is regularly using his cell phone for calendar function and reminders.  The patient reports that when he does have memory issues that sometimes he will recall them later.  Patient admits that he is more angry and that his anger is just under the surface and now it takes much less to trigger him.  The patient and his wife both report that he has been unable to keep his frustration and anger in check like he used to be.  This began at the end of his last job where he was being pushed and stressed a lot but his wife reports that he was able to control and handle this and was able to not let everything in his life bother him.  While the patient denies significant anxiety and depression type symptoms the patient's wife reports that she has noticed more anxiety and depressive symptoms with his reduced functioning.  The patient reports that pain creates a significant change in his amount of activity  that he engages and acknowledges some continued falls but denies any clear tremor type activity and denies any visual or auditory hallucinations/delusions.  Sleep is disturbed by pain.  As far as medication, the patient decided he was going to stop taking his memantine after finding out that it had only been studied for 1 year of use in clinical trials.  He misinterpreted that to imply that he should only take it for 1 year.  The patient's wife reports that when he stopped taking it that many of his  symptoms got worse particularly his anger, aggression and anxiety/depression.  After she realized that he had stopped taking it noted his changes in his symptoms this medicine was restarted and she reports that he got back to where he was prior to stopping it and these emotionally based symptoms improved.  There was not a note of significant improvements in cognition.  The patient also continues to take Cymbalta and it was recently increased from 60 mg/day to 90 mg/day.  We have set the patient up for follow-up neuropsychological testing and will repeat the majority of the neuropsychological test battery that was done by Dr. Dimple Casey previously.  The patient will be administered the [facility] in the controlled oral Word Association test as well as subtest from the D-KEFS, finger tapping test, grooved pegboard test, hand dynamometer test Trail Making Test part a and B [place] card sorting test in the [place] adult intelligence scales and the Wechsler Memory Scale's.

## 2021-11-25 DIAGNOSIS — Z961 Presence of intraocular lens: Secondary | ICD-10-CM | POA: Diagnosis not present

## 2021-11-25 DIAGNOSIS — E119 Type 2 diabetes mellitus without complications: Secondary | ICD-10-CM | POA: Diagnosis not present

## 2021-11-25 DIAGNOSIS — H5213 Myopia, bilateral: Secondary | ICD-10-CM | POA: Diagnosis not present

## 2021-11-28 ENCOUNTER — Telehealth: Payer: Self-pay | Admitting: Neurology

## 2021-11-28 NOTE — Telephone Encounter (Signed)
HTA pending faxed notes 

## 2021-11-29 ENCOUNTER — Telehealth: Payer: Self-pay | Admitting: *Deleted

## 2021-11-29 DIAGNOSIS — N3000 Acute cystitis without hematuria: Secondary | ICD-10-CM | POA: Diagnosis not present

## 2021-11-29 DIAGNOSIS — I5020 Unspecified systolic (congestive) heart failure: Secondary | ICD-10-CM

## 2021-11-29 MED ORDER — ENTRESTO 97-103 MG PO TABS: 1.0000 | ORAL_TABLET | Freq: Two times a day (BID) | ORAL | 3 refills | Status: DC

## 2021-11-29 NOTE — Telephone Encounter (Signed)
Entresto assistance faxed.

## 2021-12-03 ENCOUNTER — Encounter: Payer: Self-pay | Admitting: Student

## 2021-12-03 DIAGNOSIS — K219 Gastro-esophageal reflux disease without esophagitis: Secondary | ICD-10-CM

## 2021-12-03 DIAGNOSIS — R053 Chronic cough: Secondary | ICD-10-CM

## 2021-12-05 ENCOUNTER — Telehealth: Payer: Self-pay

## 2021-12-05 NOTE — Telephone Encounter (Signed)
Dr. Verlee Monte, please advise on pt's email. He is requesting an order for a hospital bed to help with his reflux. Pt last seen on 6/16 for chronic cough. Thanks.

## 2021-12-05 NOTE — Telephone Encounter (Signed)
Can we order him a hospital bed for his GERD?  Thanks!!

## 2021-12-05 NOTE — Telephone Encounter (Signed)
Cpap titration- HTA auth: [id number] (exp. 11/28/21 to 02/26/22)  Patient is scheduled at [facility] for 12/25/21 at 9 pm.   Mailed packet to the patient.

## 2021-12-05 NOTE — Telephone Encounter (Signed)
LVM for pt to call back to schedule sleep study.  

## 2021-12-08 ENCOUNTER — Encounter: Payer: HMO | Attending: Psychology

## 2021-12-08 DIAGNOSIS — Z9989 Dependence on other enabling machines and devices: Secondary | ICD-10-CM | POA: Diagnosis present

## 2021-12-08 DIAGNOSIS — G4733 Obstructive sleep apnea (adult) (pediatric): Secondary | ICD-10-CM | POA: Insufficient documentation

## 2021-12-08 DIAGNOSIS — N183 Chronic kidney disease, stage 3 unspecified: Secondary | ICD-10-CM | POA: Insufficient documentation

## 2021-12-08 DIAGNOSIS — F0670 Mild neurocognitive disorder due to known physiological condition without behavioral disturbance: Secondary | ICD-10-CM | POA: Diagnosis present

## 2021-12-08 DIAGNOSIS — R531 Weakness: Secondary | ICD-10-CM | POA: Insufficient documentation

## 2021-12-08 DIAGNOSIS — R0902 Hypoxemia: Secondary | ICD-10-CM | POA: Diagnosis not present

## 2021-12-08 DIAGNOSIS — I129 Hypertensive chronic kidney disease with stage 1 through stage 4 chronic kidney disease, or unspecified chronic kidney disease: Secondary | ICD-10-CM | POA: Diagnosis not present

## 2021-12-08 NOTE — Progress Notes (Signed)
Behavioral Observations  The patient appeared well-groomed and appropriately dressed. His manners were polite and appropriate to the situation. The patient demonstrated a positive attitude toward testing and showed good effort. His testing pace was slow and he often ran out of time on subtests. He moved more quickly through the verbal subtests as opposed to visual subtests.  Neuropsychology Note  Donald [name] completed 240 minutes of neuropsychological testing with technician, Dina Rich, BA, under the supervision of Ilean Skill, PsyD., Clinical Neuropsychologist. The patient did not appear overtly distressed by the testing session, per behavioral observation or via self-report to the technician. Rest breaks were offered.   Clinical Decision Making: In considering the patient's current level of functioning, level of presumed impairment, nature of symptoms, emotional and behavioral responses during clinical interview, level of literacy, and observed level of motivation/effort, Dr. Sima Matas elected to repeat the battery of tests completed in 2022 during follow-up visit on 11/24/2021. This was communicated to the technician. Communication between the neuropsychologist and technician was ongoing throughout the testing session and changes were made as deemed necessary based on patient performance on testing, technician observations and additional pertinent factors such as those listed above.  Tests Administered: Controlled Oral Word Association Test (FAS & Animals) Trail Making Test (Part A & B) Wechsler Adult Intelligence Scale, 4th Edition (WAIS-IV) Wechsler Memory Scale, 4th Edition (WMS-IV); Older Adult Battery   Results:  COWAT FAS total = 31 Z= -0.91 Animals total = 16 Z = -0.52      Trail Making Test Part A Time = 0:52.52 0 errors Part B Time =1:59.30      WAIS-IV  Verbal Comprehension Subtests Summary  Subtest Raw Score Scaled Score Percentile Rank  Reference Group Scaled Score SEM  Similarities 33 15 95 15 1.04  Vocabulary 50 14 91 15 0.67  Information 21 13 84 15 0.73  (Comprehension) 29 13 84 13 1.08        Perceptual Reasoning Subtests Summary  Subtest Raw Score Scaled Score Percentile Rank Reference Group Scaled Score SEM  Block Design 38 11 63 8 1.08  Matrix Reasoning 20 14 91 11 0.90  Visual Puzzles '9 8 25 6 '$ 0.85  (Figure Weights) 13 11 63 8 0.95  (Picture Completion) 11 10 50 8 1.16        Working Doctor, general practice Raw Score Scaled Score Percentile Rank Reference Group Scaled Score SEM  Digit Span 28 11 63 10 0.79  Arithmetic '10 7 16 7 '$ 0.99  (Letter-Number Seq.) 17 9 37 8 1.04        Processing Speed Subtests Summary  Subtest Raw Score Scaled Score Percentile Rank Reference Group Scaled Score SEM  Symbol Search '16 6 9 4 '$ 1.31  Coding 38 '7 16 4 '$ 0.99  (Cancellation) '16 3 1 2 '$ 1.34         WMS-IV  Index Score Summary  Index Sum of Scaled Scores Index Score Percentile Rank 95% Confidence Interval Qualitative Descriptor  Auditory Memory (AMI) 31 87 19 81-94 Low Average  Visual Memory (VMI) 14 84 14 80-89 Low Average  Immediate Memory (IMI) 23 85 16 80-92 Low Average  Delayed Memory (DMI) 22 83 13 77-92 Low Average      Primary Subtest Scaled Score Summary  Subtest Domain Raw Score Scaled Score Percentile Rank  Logical Memory I AM 29 9 37  Logical Memory II AM '15 8 25  '$ Verbal Paired Associates I AM '12 7 16  '$ Verbal Paired  Associates II AM '4 7 16  '$ Visual Reproduction I VM '25 7 16  '$ Visual Reproduction II VM '10 7 16  '$ Symbol Span VWM '14 8 25      '$ Auditory Memory Process Score Summary  Process Score Raw Score Scaled Score Percentile Rank Cumulative Percentage (Base Rate)  LM II Recognition 19 - - 51-75%  VPA II Recognition 26 - - 17-25%        Visual Memory Process Score Summary  Process Score Raw Score Scaled Score Percentile Rank Cumulative Percentage (Base Rate)   VR II Recognition 7 - - >75%       ABILITY-MEMORY ANALYSIS  Ability Score:  VCI: 122 Date of Testing:  WAIS-IV; WMS-IV 2021/12/08  Predicted Difference Method   Index Predicted WMS-IV Index Score Actual WMS-IV Index Score Difference Critical Value  Significant Difference Y/N Base Rate  Auditory Memory 111 87 24 9.51 Y 3-4%  Visual Memory 110 84 26 8.07 Y 3%  Immediate Memory 113 85 28 10.30 Y 1-2%  Delayed Memory 111 83 28 11.51 Y 2%  Statistical significance (critical value) at the .01 level.         Feedback to Patient: Donald [name] will return on 12/20/2021 to complete testing and again on 03/29/2022 for an interactive feedback session with Dr. Sima Matas at which time his test performances, clinical impressions and treatment recommendations will be reviewed in detail. The patient understands he can contact our office should he require our assistance before this time.  240 minutes spent face-to-face with patient administering standardized tests, 30 minutes spent scoring Environmental education officer). [CPT [id number], [id number]]  Full report to follow.

## 2021-12-09 DIAGNOSIS — G4733 Obstructive sleep apnea (adult) (pediatric): Secondary | ICD-10-CM | POA: Diagnosis not present

## 2021-12-12 DIAGNOSIS — Z66 Do not resuscitate: Secondary | ICD-10-CM | POA: Diagnosis not present

## 2021-12-12 DIAGNOSIS — E119 Type 2 diabetes mellitus without complications: Secondary | ICD-10-CM | POA: Diagnosis not present

## 2021-12-12 DIAGNOSIS — Z794 Long term (current) use of insulin: Secondary | ICD-10-CM | POA: Diagnosis not present

## 2021-12-13 ENCOUNTER — Telehealth: Payer: Self-pay | Admitting: Student

## 2021-12-14 NOTE — Telephone Encounter (Signed)
Dr. Verlee Monte, please advise if pt meets any of these qualifications needed.

## 2021-12-15 DIAGNOSIS — N3000 Acute cystitis without hematuria: Secondary | ICD-10-CM | POA: Diagnosis not present

## 2021-12-15 NOTE — Telephone Encounter (Signed)
He does not not based on my previous clinic notes

## 2021-12-16 ENCOUNTER — Telehealth: Payer: Self-pay | Admitting: Student

## 2021-12-19 NOTE — Telephone Encounter (Signed)
Appt was rescheduled 8/23 at 11 am

## 2021-12-20 DIAGNOSIS — F0670 Mild neurocognitive disorder due to known physiological condition without behavioral disturbance: Secondary | ICD-10-CM

## 2021-12-20 DIAGNOSIS — R531 Weakness: Secondary | ICD-10-CM | POA: Diagnosis not present

## 2021-12-20 NOTE — Progress Notes (Signed)
   Behavioral Observations The patient appeared well-groomed and appropriately dressed. His manners were polite and appropriate to the situation. He demonstrated a positive attitude toward testing and showed good effort.   Neuropsychology Note  Donald [name] completed 90 minutes of neuropsychological testing with technician, Dina Rich, BA, under the supervision of Ilean Skill, PsyD., Clinical Neuropsychologist. The patient did not appear overtly distressed by the testing session, per behavioral observation or via self-report to the technician. Rest breaks were offered.   Clinical Decision Making: In considering the patient's current level of functioning, level of presumed impairment, nature of symptoms, emotional and behavioral responses during clinical interview, level of literacy, and observed level of motivation/effort, a battery of tests was selected by Dr. Sima Matas during initial consultation on 11/24/2021. This was communicated to the technician. Communication between the neuropsychologist and technician was ongoing throughout the testing session and changes were made as deemed necessary based on patient performance on testing, technician observations and additional pertinent factors such as those listed above.  Tests Administered: [place] (BNT) [name] Executive Functioning System (D-KEFS), Color-Word  Inference Test Finger Tapping Test (FTT) Grooved Pegboard Hand Dynamometer  Modified [employer] Test (M-WCST) [employer] (WCST)  Results:  [place] Naming Test # Correct without assistance = 56 # stimulus cues given = 4 # correct following stimulus cues = 0 # phonemic cues given = 4 # correct following phonemic cue = 4      D-KEFS *Results will be included in final report Color Naming Raw Score  Word Reading Inhibition Inhibition/Switching     Finger Tapping Test Right hand 27 35 38 38 39 Left hand  (dominant) 33 37 38 39 39      Grooved Pegboard Right hand Time = 1:51.07 0 drops Left hand (dominant) Time = 1:42.02 0 drops   Hand Dynamometer Right hand  25 25 Left hand (dominant) 24 22     [place] [employer]  *Results will be included in final report **Completed all 6 categories & recognized category switching  Feedback to Patient: Donald [name] will return on 03/29/2022 or sooner for an interactive feedback session with Dr. Sima Matas at which time his test performances, clinical impressions and treatment recommendations will be reviewed in detail. The patient understands he can contact our office should he require our assistance before this time.  90 minutes spent face-to-face with patient administering standardized tests, 30 minutes spent scoring Environmental education officer). [CPT [id number], [id number]]  Full report to follow.

## 2021-12-25 ENCOUNTER — Ambulatory Visit (INDEPENDENT_AMBULATORY_CARE_PROVIDER_SITE_OTHER): Payer: HMO | Admitting: Neurology

## 2021-12-25 DIAGNOSIS — Z9989 Dependence on other enabling machines and devices: Secondary | ICD-10-CM

## 2021-12-25 DIAGNOSIS — G4734 Idiopathic sleep related nonobstructive alveolar hypoventilation: Secondary | ICD-10-CM

## 2021-12-25 DIAGNOSIS — G4733 Obstructive sleep apnea (adult) (pediatric): Secondary | ICD-10-CM | POA: Diagnosis not present

## 2021-12-25 DIAGNOSIS — G478 Other sleep disorders: Secondary | ICD-10-CM

## 2021-12-26 NOTE — Progress Notes (Unsigned)
Synopsis: Referred for acute cough by Prince Solian, MD  Subjective:   PATIENT ID: Donald [name] GENDER: male DOB: 12-Oct-1950, MRN: [id number]  No chief complaint on file.  71yM with history of UACS vs asthma followed in our clinic last seen 2016, GERD, sleep-disordered breathing on CPAP and O2, referred for acute cough.  Saw ENT at [facility] 2016, they felt cough/arytenoid edema caused by GERD, LPR.  He has had cough which he thinks is related to something in his throat. He has had neck surgery in past for herniated disks - 10 years ago. He also has elevated left collarbone related to prior injury and healing and still wonders if it's related to his cough. It is not productive. There is positional component to his cough where if he flexes his neck it's worse. He has no trouble swallowing. No real trouble with choking/aspirating on food/liquids.   He does think a prednisone course helped a little bit.  He is still taking protonix 40 mg once daily in AM right before breakfast.   Very frequent sensation of PND. Doesn't feel sinonasal congestion. Had deviated septum surgery in 1980s/90s.  He has mild DOE which he attributes to asthma - has not worsened since visit in 2016. Takes albuterol intermittently.   No family history of lung disease  He has secondhand smoke exposure.   He was a Dance movement psychotherapist, worked with Veterinary surgeon. 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 Had frequent dust/particulate exposure without mask back then (last in 1981). Has beagle at home. No pet birds. He has no hot tub. Lived in [place] for some time, [place].  Interval HPI: At last visit reinforced TLC measures for GERD.  Otherwise pertinent review of systems is negative.  Past Medical History:  Diagnosis Date   Allergic rhinitis, seasonal    Asthma    Bronchitis    Chronic kidney disease, stage I    collar bone    dislocation left side 05/2014   Degenerative joint disease of knee     bilateral   Diabetes mellitus    GERD (gastroesophageal reflux disease)    Heart murmur    history of   History of acute prostatitis    History of hematuria    Hyperlipidemia    Hypertension    IgA nephropathy    OSA on CPAP    Pneumonia    Sinusitis      Family History  Problem Relation Age of Onset   Coronary artery disease Father    Diabetes type II Father    Heart attack Father      Past Surgical History:  Procedure Laterality Date   BIOPSY  05/17/2020   Procedure: BIOPSY;  Surgeon: Clarene Essex, MD;  Location: [facility];  Service: Endoscopy;;   CARDIAC CATHETERIZATION  09/29/2011   normal   CERVICAL SPINE SURGERY     COLONOSCOPY WITH PROPOFOL N/A 05/17/2020   Procedure: COLONOSCOPY WITH PROPOFOL;  Surgeon: Clarene Essex, MD;  Location: [facility];  Service: Endoscopy;  Laterality: N/A;   deviated septum  1930s   ESOPHAGOGASTRODUODENOSCOPY (EGD) WITH PROPOFOL N/A 05/17/2020   Procedure: ESOPHAGOGASTRODUODENOSCOPY (EGD) WITH PROPOFOL;  Surgeon: Clarene Essex, MD;  Location: [facility];  Service: Endoscopy;  Laterality: N/A;   HEMOSTASIS CLIP PLACEMENT  05/17/2020   Procedure: HEMOSTASIS CLIP PLACEMENT;  Surgeon: Clarene Essex, MD;  Location: [facility];  Service: Endoscopy;;   HERNIA REPAIR     LEFT HEART CATHETERIZATION WITH CORONARY ANGIOGRAM N/A 09/29/2011  Procedure: LEFT HEART CATHETERIZATION WITH CORONARY ANGIOGRAM;  Surgeon: Sanda Klein, MD;  Location: [facility] CATH LAB;  Service: Cardiovascular;  Laterality: N/A;   NM MYOVIEW LTD  07/08/2011   mild LV dilatation,mild septal hypokinesia   POLYPECTOMY  05/17/2020   Procedure: POLYPECTOMY;  Surgeon: Clarene Essex, MD;  Location: [facility];  Service: Endoscopy;;   TONSILLECTOMY     US ECHOCARDIOGRAPHY  09/07/2011   EF 35-45%,mod.ant hypokinesis,boderline LA & RA enlargement,trace MR,TR & PI    Social History   Socioeconomic History   Marital status: Married    Spouse name: Diane   Number of children: 1   Years of  education: 14   Highest education level: Not on file  Occupational History   Occupation: Herbalist: Interior and spatial designer  Tobacco Use   Smoking status: Never   Smokeless tobacco: Never   Tobacco comments:    Second hand smoke for 26 years per pt  Vaping Use   Vaping Use: Never used  Substance and Sexual Activity   Alcohol use: No    Alcohol/week: 0.0 standard drinks of alcohol   Drug use: No   Sexual activity: Not on file  Other Topics Concern   Not on file  Social History Narrative   Patient is married (Diane) and lives at home with his wife.   Patient has one child.   Patient is working full-time.   Patient has a Geophysicist/field seismologist.   Patient is left-handed.   Patient drinks one glass of tea daily, one soda daily.   Social Determinants of Health   Financial Resource Strain: Not on file  Food Insecurity: Not on file  Transportation Needs: Not on file  Physical Activity: Not on file  Stress: Not on file  Social Connections: Not on file  Intimate Partner Violence: Not on file     Allergies  Allergen Reactions   Ciprofloxacin     Tendons problem in shoulder Other reaction(s): Arthralgias (intolerance), Myalgias (intolerance)   Empagliflozin Anaphylaxis    Dehydration   Levofloxacin     Other reaction(s): Arthralgias (intolerance), Myalgias (intolerance)   Aricept [Donepezil Hcl] Nausea Only   Bactrim [Sulfamethoxazole-Trimethoprim] Hives   Levofloxacin Other (See Comments)    Muscle tighten up in left and right calf   Misc. Sulfonamide Containing Compounds Rash     Outpatient Medications Prior to Visit  Medication Sig Dispense Refill   albuterol (PROVENTIL HFA;VENTOLIN HFA) 108 (90 BASE) MCG/ACT inhaler Inhale 2 puffs into the lungs every 6 (six) hours as needed for wheezing or shortness of breath. For shortness of breath     Continuous Blood Gluc Sensor (FREESTYLE LIBRE 14 DAY SENSOR) MISC      Cyanocobalamin (VITAMIN B12) 500 MCG TABS Take 500 mcg  by mouth daily.     DULoxetine (CYMBALTA) 30 MG capsule Take 90 mg by mouth daily.     finasteride (PROSCAR) 5 MG tablet Take 5 mg by mouth daily.     furosemide (LASIX) 20 MG tablet Take 1 tablet (20 mg total) by mouth daily in the afternoon. 90 tablet 3   HYDROcodone-acetaminophen (NORCO/VICODIN) 5-325 MG tablet Take one tablet by mouth every 6 hours for severe pain Dx: R07.51     Insulin Aspart FlexPen (NOVOLOG) 100 UNIT/ML Inject 40-60 Units into the skin as needed.     insulin degludec (TRESIBA FLEXTOUCH) 200 UNIT/ML FlexTouch Pen Inject 90 Units into the skin daily in the afternoon.     Insulin Pen Needle (UNIFINE PENTIPS  PLUS) 32G X 4 MM MISC USE WITH TRESIBA DAILY     loratadine (CLARITIN) 10 MG tablet Take 10 mg by mouth daily.     memantine (NAMENDA) 10 MG tablet Take 1 tablet (10 mg total) by mouth 2 (two) times daily. 60 tablet 11   metFORMIN (GLUCOPHAGE) 1000 MG tablet Take 1 tablet (1,000 mg total) by mouth 2 (two) times daily with a meal. Restart on 8/22     metoprolol succinate (TOPROL-XL) 50 MG 24 hr tablet Take 50 mg by mouth 2 (two) times daily. Take with or immediately following a meal.     Multiple Vitamins-Minerals (CENTRUM SILVER 50+MEN PO) Take 1 tablet by mouth daily in the afternoon.     Omega-3 Fatty Acids (FISH OIL) 1000 MG CPDR Take 2,000 Units by mouth daily.     pantoprazole (PROTONIX) 40 MG tablet Take 40 mg by mouth daily.     rosuvastatin (CRESTOR) 20 MG tablet Take 1 tablet (20 mg total) by mouth daily. 90 tablet 3   sacubitril-valsartan (ENTRESTO) 97-103 MG Take 1 tablet by mouth 2 (two) times daily. 180 tablet 3   silodosin (RAPAFLO) 8 MG CAPS capsule Take 8 mg by mouth daily.     spironolactone (ALDACTONE) 25 MG tablet Take 1 tablet (25 mg total) by mouth daily. 90 tablet 3   No facility-administered medications prior to visit.       Objective:   Physical Exam:  General appearance: 71 y.o., male, NAD, conversant  Eyes: anicteric sclerae; PERRL,  tracking appropriately HENT: NCAT; MMM Neck: Trachea midline; no lymphadenopathy, no JVD Lungs: CTAB, no crackles, no wheeze, with normal respiratory effort CV: RRR, no murmur  Abdomen: Soft, non-tender; non-distended, BS present  Extremities: No peripheral edema, warm Skin: Normal turgor and texture; no rash Psych: Appropriate affect Neuro: Alert and oriented to person and place, no focal deficit     There were no vitals filed for this visit.      on RA BMI Readings from Last 3 Encounters:  10/21/21 29.53 kg/m  08/17/21 29.59 kg/m  07/19/21 29.89 kg/m   Wt Readings from Last 3 Encounters:  10/21/21 200 lb (90.7 kg)  08/17/21 200 lb 6.4 oz (90.9 kg)  07/19/21 202 lb 6.4 oz (91.8 kg)     CBC    Component Value Date/Time   WBC 10.5 03/27/2021 2320   RBC 5.28 03/27/2021 2320   HGB 13.1 03/27/2021 2320   HGB 13.4 10/18/2020 1033   HCT 42.4 03/27/2021 2320   HCT 42.5 10/18/2020 1033   PLT 290 03/27/2021 2320   PLT 309 10/18/2020 1033   MCV 80.3 03/27/2021 2320   MCV 82 10/18/2020 1033   MCH 24.8 (L) 03/27/2021 2320   MCHC 30.9 03/27/2021 2320   RDW 17.2 (H) 03/27/2021 2320   RDW 16.7 (H) 10/18/2020 1033   LYMPHSABS 1.9 10/18/2020 1033   MONOABS 0.7 12/25/2018 1500   EOSABS 0.4 10/18/2020 1033   BASOSABS 0.1 10/18/2020 1033    Low level eosinophilia historically Broad autoimmune panel, HP panel negative in 2016  Chest Imaging:   CXR 2020 reviewed by me and unremarkable  CT A/P 2020 reviewed by me, lung bases unremarkable  CT Chest 2016 reviewed by me and unremarkable  HRCT 02/14/21 reviewed by me with subtle air trapping  CT Coronaries 05/11/21 reviewed by me with atelectasis lung bases  Pulmonary Functions Testing Results:    Latest Ref Rng & Units 02/11/2021   11:56 AM 06/17/2014    2:21  PM  PFT Results  FVC-Pre L 2.81  3.30   FVC-Predicted Pre % 67  75   FVC-Post L 3.04  3.49   FVC-Predicted Post % 73  79   Pre FEV1/FVC % % 73  73   Post  FEV1/FCV % % 76  77   FEV1-Pre L 2.05  2.42   FEV1-Predicted Pre % 66  73   FEV1-Post L 2.32  2.69   DLCO uncorrected ml/min/mmHg 19.80  21.18   DLCO UNC% % 80  71   DLCO corrected ml/min/mmHg 19.80    DLCO COR %Predicted % 80    DLVA Predicted % 100  94   TLC L 4.54  5.18   TLC % Predicted % 68  78   RV % Predicted % 78  83    Mild restriction without air trapping and with borderline bronchodilator response  CPET 07/2014: Stopped due to dyspnea and leg fatigue MVV 88 (67% pred) pre exercise Peak vo2 just under normal for ibw (84% ibw pred) HRR 7 bpm  Peak HR 151 Peak RR 38 Peak V: 69.5 BR of 18.5 Ve/MVV: 79%   Echocardiogram:   TTE 09/2011 with EF 35-45%      Assessment & Plan:   # Acute on chronic cough: # Mild restrictive ventilatory defect, borderline BD response: # GERD/LPR Etiology of cough may be multifactorial - regardless he's doing better at this visit. I still feel cough by history (and prior FNL exam in 2016) is most consistent with LPR/GERD, less likely longstanding asthma (does have borderline BD response). Alternatively 9% of patients in entresto arm of PARADIGM-HF trial did develop cough. Restriction may be due to combination of habitus and volume overload at time of PFT.  Plan: - flonase for at least a few weeks when has postnasal drainage, continue allegra - keep taking protonix everyday in morning 30 minutes before eating or 30 min before dinner in evening - reinforced GERD lifestyle measures. He will try to stop eating two large scoops of chocolate ice cream with soda right before bed for at least the next 8 weeks. - offered trial of LABA/ICS but they want to try to implement GERD lifestyle measures first. I'll ask pharmacy to see which LABA/ICS is most affordable in case we decide to trial it at next visit - if unresponsive to intensification of lifestyle measures for GERD, trial of LABA/ICS may need to consider alternative GDMT to entresto if cough is  bothering him this much    RTC 8 weeks  Maryjane Hurter, MD [facility] 12/26/2021 5:15 PM

## 2021-12-28 ENCOUNTER — Ambulatory Visit: Payer: HMO | Admitting: Student

## 2021-12-28 ENCOUNTER — Encounter: Payer: Self-pay | Admitting: Student

## 2021-12-28 VITALS — BP 110/70 | HR 88 | Ht 69.0 in | Wt 198.4 lb

## 2021-12-28 DIAGNOSIS — R053 Chronic cough: Secondary | ICD-10-CM

## 2021-12-28 MED ORDER — PANTOPRAZOLE SODIUM 40 MG PO TBEC: 40.0000 mg | DELAYED_RELEASE_TABLET | Freq: Two times a day (BID) | ORAL | 0 refills | Status: AC

## 2021-12-28 NOTE — Patient Instructions (Addendum)
- If you find that your cough worsens as you decrease dose protonix to once daily, then I'd recommend getting an appointment with GI Dr. Watt Climes just to make sure there's not something underlying causing worse reflux - Conservative measures as below to work on throat clearing (look for bicarbonate or baking soda gum on [place] - there's a [place] that makes this) - We can consider a controller inhaler for asthma at or before next visit if doing no better   You are a chronic throat clearer! You are not alone! The causes of chronic throat clearing include acid reflux (laryngopharyngeal reflux), allergies, environmental irritants such as tobacco smoke and air pollution, and asthma. If present for a long time throat clearing can become habit forming. When you clear your throat, you are transferring mucus from your throat up into your mouth and nose. We all secrete up to 2 liters (imagine a big Coke bottle) of mucus a day. This saliva is usually swallowed and ends up in the toilet eventually. By clearing the mucus back into your mouth and nose you are sending the saliva in the wrong direction. This is counterproductive. Unless you are walking around spitting all day (which most throat clearers do not do), the mucus will work its way back down to the throat and eventually be swallowed. Get the mucus going in the right direction. Swallow! Swallow! Swallow! No throat clearing.  Chronic throat clearing is damaging. The trauma from the throat clearing can cause redness and swelling of your vocal cords. If the clearing is very excessive small growths (granulomas) can form. These granulomas can get so large that they can eventually affect your breathing. Surgical removal may be necessary. The irritation and swelling produced by the clearing can cause saliva to sit in your throat. This causes more throat clearing. More throat clearing causes more stagnant mucus which causes more throat clearing, which causes more  mucus, etc. A vicious cycle will ensue and the habit can be very difficult to break. Without your help and a conscious effort on your part to break the cycle, the throat clearing will never stop.  Your doctor may prescribe medication and behavioral modifications to treat acid reflux disease. Nose and throat sprays may be prescribed to treat underlying allergies or asthma. Avoiding possible irritants will be recommended. Without changes to your behavior these treatments will not be successful. The following alterations are recommended:  Do not clear your throat. Swallow instead. This gets the mucus going in the right direction towards the toilet.  Carry around some water to assist with swallowing and mucus clearance. When you feel the urge to clear your throat take a sip of the water. If you absolutely need to clear your throat perform a non-traumatic throat clear. To do this pant with your mouth open and say "[place], [place], [place]" with a powerful but very breathy voice. This will clear the secretions without causing damage. Increase your water intake. This will thin secretions and make it easier to swallow. Comply with the behavior recommendations for reflux disease. Chew baking soda ([employer]) gum. This can be found on the internet or in the tooth paste isle of your pharmacy. Gum chewing can help with swallowing, reflux, and throat clearing. Chew three pieces a day. If you develop jaw discomfort or headaches decrease the amount of gum chewing. Tell your friends and family to tell you to swallow when you clear your throat. Some people have been clearing so long that they don't even  know when they are doing it. Be patient. The urge to clear your throat will not go away overnight. It may take 8 or 12 weeks for the medication and behavior modifications to work.

## 2021-12-29 ENCOUNTER — Encounter (HOSPITAL_BASED_OUTPATIENT_CLINIC_OR_DEPARTMENT_OTHER): Payer: HMO | Admitting: Psychology

## 2021-12-29 DIAGNOSIS — Z9989 Dependence on other enabling machines and devices: Secondary | ICD-10-CM

## 2021-12-29 DIAGNOSIS — R0902 Hypoxemia: Secondary | ICD-10-CM | POA: Diagnosis not present

## 2021-12-29 DIAGNOSIS — N183 Chronic kidney disease, stage 3 unspecified: Secondary | ICD-10-CM

## 2021-12-29 DIAGNOSIS — G4733 Obstructive sleep apnea (adult) (pediatric): Secondary | ICD-10-CM

## 2021-12-29 DIAGNOSIS — F0670 Mild neurocognitive disorder due to known physiological condition without behavioral disturbance: Secondary | ICD-10-CM

## 2021-12-29 DIAGNOSIS — R531 Weakness: Secondary | ICD-10-CM | POA: Diagnosis not present

## 2021-12-29 NOTE — Progress Notes (Signed)
Neuropsychological Evaluation   Patient:  Donald [name]   DOB: 1950-11-04  MR Number: [id number]  Location: [facility] PHYSICAL MEDICINE AND REHABILITATION [place], [place] [id number] [place] [id number] Dept: [phone]  Start: 3 PM End: 4 PM  Provider/Observer:     Edgardo Roys PsyD  Chief Complaint:     No chief complaint on file.   Reason For Service:     This was a follow-up assessment after the patient was seen a little bit over a year ago by Dr. Darol Destine for neuropsychological evaluation and this is for repeat testing.  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  The patient also has a history of obstructive sleep apnea and had had previous falls but denied any tremors or other significant changes in gait or balance.  There were no descriptions of any visual hallucinations or delusions etc.  Today a new clinical interview was conducted with the patient and his wife.  The patient reported that he has had some worsening of his overall symptoms and feels like he is having more frequent times where he has difficulty remembering people's names or coming up with the word that he wants to find.  He reports that he has also had times where he has had said the wrong word or set a word that had a different meaning.  The patient reports significant attentional issues with some worsening and difficulties keeping up with instructions and difficulty with increased distractibility.  The patient reports that he has adapted some and is expecting to make these errors now and is being more aware and cautious when both speaking or when he is in distracting  situations.  The patient reports that he is still regularly using his CPAP machine with constant use and also uses oxygen as well.  The patient has less of an obstructive apneic event but more of pausing while breathing and suppressed breathing rate and uses oxygen.  He has had times where his oxygen levels got down to 77 %.  The patient's wife reports that the patient is becoming more open about telling her when he forgets things and she has noted significant distractibility continues and she perceives it to be somewhat worse than 1 year ago.  She reports that he cannot do 2 things at once now like he used to be.  The patient has been a Dance movement psychotherapist and was able to be actively engaged in multiple things but now gets too distracted and has to do 1 thing at a time.  She reports that if they follow a very programmatic routine that this helps with him staying on task and the patient is regularly using his cell phone for calendar function and reminders.  The patient reports that when he does have memory issues that sometimes he will recall them later.  Patient admits that he is more angry and that his anger is just under the surface and now it takes much less to trigger him.  The patient and his wife both report that he has been unable to keep his frustration and anger in check like he used to be.  This began at the end of his last job where he was being pushed and stressed a lot but his wife reports  that he was able to control and handle this and was able to not let everything in his life bother him.  While the patient denies significant anxiety and depression type symptoms the patient's wife reports that she has noticed more anxiety and depressive symptoms with his reduced functioning.  The patient reports that pain creates a significant change in his amount of activity that he engages in and he acknowledges some continued falls but denies any clear tremor type activity and denies any visual or auditory  hallucinations/delusions.  Sleep is disturbed by pain.  As far as medication, the patient decided he was going to stop taking his memantine after finding out that it had only been studied for 1 year of use in clinical trials.  He misinterpreted that to imply that he should only take it for 1 year.  The patient's wife reports that when he stopped taking it that many of his symptoms got worse particularly his anger, aggression and anxiety/depression.  After she realized that he had stopped taking it noted his changes in his symptoms this medicine was restarted and she reports that he got back to where he was prior to stopping it and these emotionally based symptoms improved.  There was not a note of significant improvements in cognition.  The patient also continues to take Cymbalta and it was recently increased from 60 mg/day to 90 mg/day.  Tests Administered: Controlled Oral Word Association Test (FAS & Animals) Trail Making Test (Part A & B) Wechsler Adult Intelligence Scale, 4th Edition (WAIS-IV) Wechsler Memory Scale, 4th Edition (WMS-IV); Older Adult Battery  [place] (BNT) [place] Executive Functioning System (D-KEFS), Color-Word  Inference Test Finger Tapping Test (FTT) Grooved Pegboard Hand Dynamometer  Modified [place] Card Sorting Test (M-WCST) [employer] Test [facility])  Participation Level:   Active  Participation Quality:  Appropriate      Behavioral Observation:  The patient appeared well-groomed and appropriately dressed. His manners were polite and appropriate to the situation. The patient demonstrated a positive attitude toward testing and showed good effort. His testing pace was slow and he often ran out of time on subtests. He moved more quickly through the verbal subtests as opposed to visual subtests.Well Groomed, Alert, and Appropriate.   Test Results:     2022 Results:  Validity Testing:     Descriptor           WAIS-IV Reliable Digit Span:  --- --- Within Expectation           Intellectual Functioning:                 Wechsler Adult Intelligence Scale (WAIS-IV):  Standard Score/ Scaled Score Percentile    Full Scale IQ  108 70 Average  GAI 121 92 Well Above Average  Verbal Comprehension Index: 127 16 Well Above Average  Similarities  14 91 Above Average  Vocabulary 15 95 Well Above Average  Information  15 95 Well Above Average  Perceptual Reasoning Index:  109 73 Average  Block Design  12 75 Above Average  Matrix Reasoning  14 91 Above Average  Visual Puzzles 9 37 Average  Working Memory Index: 97 42 Average  Digit Span 10 50 Average  Arithmetic  9 37 Average  Processing Speed Index: 84 14 Below Average  Symbol Search  7 16 Below Average  Coding 7 16 Below Average           Wechsler Adult Intelligence Scale (WAIS-IV) Short Form*: Standard Score/ Scaled  Score Percentile    Full Scale IQ 113 81 Above Average  Similarities 14 91 Above Average  Digit Span 10 50 Average  Vocabulary 15 95 Well Above Average  Arithmetic 9 37 Average  Information 15 95 Well Above Average  *From Merz et al. (2020)                 Memory:                 Wechsler Memory Scale (WMS-IV):  Adult Battery                    Raw Score (Scaled Score) Percentile    Logical Memory I 21/50 (8) 25 Average  Logical Memory II 14/50 (7) 16 Below Average  Logical Memory Recognition 23/30 26-50 Average           Verbal Paired Associates I 18 (7) 16 Below Average  Verbal Paired Associates II 7 (9) 36 Average  Verbal Paired Associates Recognition  36/40 26-50 Average           Visual Reproduction I 33/43 (10) 50 Average  Visual Reproduction II 12/43 (7) 16 Below Average  Visual Reproduction Recognition 5/7 26-50 Average           Attention/Executive Function:                 Trail Making Test (TMT): Raw Score  (T Score) Percentile    Part A 50 secs., 0 errors (38) 12 Below Average  Part B 130 secs., 0 errors (37) 9 Below Average              Scaled Score Percentile    WAIS-IV Coding: 7 16 Below Average             Scaled Score Percentile    WAIS-IV Digit Span: 10 50 Average  Forward 11 63 Average  Backward 10 50 Average  Sequencing 8 25 Average             Age-Scaled Score Percentile    Wechsler Memory Scale (WMS-IV) Symbol Span: 10 50 Average             Scaled Score Percentile    WAIS-IV Similarities: 14 91 Above Average           D-KEFS Color-Word Interference Test: Raw Score (Scaled Score) Percentile    Color Naming  40 secs. (7) 16 Below Average  Word Reading  29 secs. (8) 25 Average  Inhibition  86 secs. (7) 16 Below Average  Inhibition/Switching  73 secs. (11) 36 Average           [place]consin Card Sorting Test: Raw Score Percentile    Categories (trials) 6 (73) >16 Average  Total Errors 10 95 Well Above Average  % Perseverative Errors 7 92 Well Above Average  % Non-Perseverative Errors 7 84 Above Average  Failure to Maintain Set 0 >16 Average           Language:         202        Verbal Fluency Test: Raw Score  (T Score) Percentile    Phonemic Fluency (FAS) 36 (46) 34 Average  Animal Fluency 18 (48) 42 Average             Raw Score (T Score) Percentile    [place] Naming Test (BNT): 56/60 (51) 54 Average           Visuospatial/Visuoconstruction:  Raw Score Percentile    Clock Drawing: 10/10 --- WNL             Scaled Score Percentile    WAIS-IV Block Design: 12 75 Above Average  WAIS-IV Matrix Reasoning: 14 91 Above Average  WAIS-IV Visual Puzzles: 9 37 Average           Sensory-Motor:                 [place] Grooved Pegboard Test: Raw Score  (T-Score) Percentile    Dominant Hand 99 secs., 1 drop (41) 18 Below Average  Non-Dominant Hand 91 secs., 0 drops  (44) 27 Average           Finger Tapping Test: Mean (T-Score) Percentile    Dominant Hand 50 (47) 38 Average  Non-Dominant Hand 51 (59) 82 Above Average           Hand Dynamometer        Dominant Hand 25 Kg --- Below Average   Non-Dominant Hand 25 Kg --- Below Average             2023 Testing results:  COWAT FAS total = 31 Z= -0.91 Animals total = 16 Z = -0.52    Only slightly below first assessment and will mild weakness identified results were still within normal limits.       Trail Making Test Part A Time = 0:52.52 0 errors Part B Time =1:59.30      [place] Naming Test # Correct without assistance = 56 # stimulus cues given = 4 # correct following stimulus cues = 0 # phonemic cues given = 4 # correct following phonemic cue = 4           D-KEFS *Results will be included in final report Color Naming Raw Score   Word Reading Inhibition Inhibition/Switching         Finger Tapping Test Right hand 27 35 38 38 39 Left hand (dominant) 33 37 38 39 39       Grooved Pegboard Right hand Time = 1:51.07 0 drops Left hand (dominant) Time = 1:42.02 0 drops     Hand Dynamometer Right hand  25 25 Left hand (dominant) 24 22         [place] Card Sorting Test  *Results will be included in final report **Completed all 6 categories & recognized category switching     WAIS-IV  Composite Score Summary  Scale Sum of Scaled Scores Composite Score Percentile Rank 95% Conf. Interval Qualitative Description  Verbal Comprehension 42 VCI 122 93 115-127 Superior  Perceptual Reasoning 33 PRI 105 63 99-111 Average  Working Memory 18 WMI 95 37 89-102 Average  Processing Speed 13 PSI 81 10 75-91 Low Average  Full Scale 106 FSIQ 104 61 100-108 Average  General Ability 75 GAI 115 84 110-119 High Average              Verbal Comprehension Subtests Summary     Subtest Raw Score Scaled Score Percentile Rank Reference Group Scaled Score SEM  Similarities 33 15 95 15 1.04  Vocabulary 50 14 91 15 0.67  Information 21 13 84 15 0.73  (Comprehension) 29 13 84 13 1.08                      Perceptual Reasoning Subtests Summary     Subtest Raw Score Scaled Score  Percentile Rank Reference Group Scaled Score SEM  Block Design 38 11 63 8 1.08  Matrix Reasoning 20 14 91 11 0.90  Visual Puzzles '9 8 25 6 '$ 0.85  (Figure Weights) 13 11 63 8 0.95  (Picture Completion) 11 10 50 8 1.16                      Working Hydrographic surveyor Raw Score Scaled Score Percentile Rank Reference Group Scaled Score SEM  Digit Span 28 11 63 10 0.79  Arithmetic '10 7 16 7 '$ 0.99  (Letter-Number Seq.) 17 9 37 8 1.04                      Processing Speed Subtests Summary     Subtest Raw Score Scaled Score Percentile Rank Reference Group Scaled Score SEM  Symbol Search '16 6 9 4 '$ 1.31  Coding 38 '7 16 4 '$ 0.99  (Cancellation) '16 3 1 2 '$ 1.34                WMS-IV           Index Score Summary       Index Sum of Scaled Scores Index Score Percentile Rank 95% Confidence Interval Qualitative Descriptor  Auditory Memory (AMI) 31 87 19 81-94 Low Average  Visual Memory (VMI) 14 84 14 80-89 Low Average  Immediate Memory (IMI) 23 85 16 80-92 Low Average  Delayed Memory (DMI) 22 83 13 77-92 Low Average                 Primary Subtest Scaled Score Summary     Subtest Domain Raw Score Scaled Score Percentile Rank  Logical Memory I AM 29 9 37  Logical Memory II AM '15 8 25  '$ Verbal Paired Associates I AM '12 7 16  '$ Verbal Paired Associates II AM '4 7 16  '$ Visual Reproduction I VM '25 7 16  '$ Visual Reproduction II VM '10 7 16  '$ Symbol Span VWM '14 8 25                '$ Auditory Memory Process Score Summary      Process Score Raw Score Scaled Score Percentile Rank Cumulative Percentage (Base Rate)  LM II Recognition 19 - - 51-75%  VPA II Recognition 26 - - 17-25%                     Visual Memory Process Score Summary      Process Score Raw Score Scaled Score Percentile Rank Cumulative Percentage (Base Rate)  VR II Recognition 7 - - >75%            ABILITY-MEMORY ANALYSIS   Ability Score:    VCI: 122 Date of Testing:           WAIS-IV;  WMS-IV 2021/12/08             Predicted Difference Method    Index Predicted WMS-IV Index Score Actual WMS-IV Index Score Difference Critical Value   Significant Difference Y/N Base Rate  Auditory Memory 111 87 24 9.51 Y 3-4%  Visual Memory 110 84 26 8.07 Y 3%  Immediate Memory 113 85 28 10.30 Y 1-2%  Delayed Memory 111 83 28 11.51 Y 2%  Statistical significance (critical value) at the .01 level.        Impression/Diagnosis:   Results of the current neuropsychological evaluation are encouraging.  There is is only mild reduction in global cognitive functioning and the comparisons of the testing done in 2022 versus  the current testing in 2023.  The patient has shown a little bit more slowing as far as information processing speed which was a specific weakness that he displayed on the first assessment.  The patient is showing maintained encoding capacity.  The patient is maintaining overall verbal comprehension skills quite well and the patient is also maintaining visual spatial capacity when comparing 2022 versus 2023.  He is also maintaining efficient executive functioning.  For the most part most of the scores are within normal limits and generally consistent with premorbid intellectual abilities however there continue to be areas of relative weakness.  Variable encoding capacity, decreased information processing speed are noted.  The patient has significant medical history including multiple chronic diseases and vascular risk and while MRI results were consistent with some age-appropriate changes there were indications to some degree of microvascular ischemic changes that were stable between 2021 and 2022.  There was some general atrophy that was age consistent.  The patient has been compliant with his CPAP use and care for his obstructive sleep apnea but continues to have hypoxemia at times and uses oxygen.  Again, there does not appear to be any significant change or patterns of changes  consistent with degenerative cortical or subcortical findings typical of condition such as Alzheimer's, Lewy body dementia or other subcortical dementia as but he does show findings that would be consistent with white matter involvement and subcortical involvement.  The most likely causative factors of this would be his cardiovascular issues including hypoxemia, obstructive sleep apnea along with ongoing issues with hypertension and type 2 diabetes.  He appears to be managing these underlying medical issues well and there is only very slight changes overall in cognitive functioning.  The patient appears to be more aware of the areas that he has difficulty and is managing these.  He does continue to have some difficulty with emotional dyscontrol that became exacerbated when he discontinued his memantine and improved again when this was restarted.  He does appear to be responding well to this medication.  In any event, there does not appear to be indications of progressive degenerative conditions beyond the effects of these long-term and chronic health issues and there negative impact on white matter and subcortical regions.  The patient would continue to meet the diagnostic criteria for a mild neurocognitive disorder due to multiple etiologies.  I will sit down with the patient and his family and go over the results.  Again, the current findings are quite encouraging.  The patient should remain quite diligent and focused on his overall health managing his type 2 diabetes, obstructive sleep apnea and difficulties maintaining adequate blood oxygen levels.  Regular physical activity a good dietary habits and continuing to work on good sleep patterns are imperative to limit and retard any further progression in his cognitive difficulties.  Diagnosis:    Mild neurocognitive disorder due to multiple etiologies  OSA on CPAP  Stage 3 chronic kidney disease, unspecified whether stage 3a or 3b CKD  (HCC)  Hypoxemia   _____________________ Ilean Skill, Psy.D. Clinical Neuropsychologist

## 2022-01-04 DIAGNOSIS — M6281 Muscle weakness (generalized): Secondary | ICD-10-CM | POA: Diagnosis not present

## 2022-01-05 ENCOUNTER — Encounter: Payer: Self-pay | Admitting: Cardiovascular Disease

## 2022-01-11 DIAGNOSIS — G4733 Obstructive sleep apnea (adult) (pediatric): Secondary | ICD-10-CM | POA: Diagnosis not present

## 2022-01-12 ENCOUNTER — Ambulatory Visit: Payer: HMO | Admitting: Psychology

## 2022-01-12 ENCOUNTER — Encounter: Payer: HMO | Attending: Psychology | Admitting: Psychology

## 2022-01-12 DIAGNOSIS — F0670 Mild neurocognitive disorder due to known physiological condition without behavioral disturbance: Secondary | ICD-10-CM | POA: Insufficient documentation

## 2022-01-12 DIAGNOSIS — G4733 Obstructive sleep apnea (adult) (pediatric): Secondary | ICD-10-CM | POA: Insufficient documentation

## 2022-01-12 DIAGNOSIS — Z9989 Dependence on other enabling machines and devices: Secondary | ICD-10-CM | POA: Diagnosis not present

## 2022-01-12 DIAGNOSIS — R0902 Hypoxemia: Secondary | ICD-10-CM | POA: Insufficient documentation

## 2022-01-12 DIAGNOSIS — N183 Chronic kidney disease, stage 3 unspecified: Secondary | ICD-10-CM | POA: Insufficient documentation

## 2022-01-12 DIAGNOSIS — R531 Weakness: Secondary | ICD-10-CM | POA: Diagnosis not present

## 2022-01-15 ENCOUNTER — Telehealth: Payer: Self-pay | Admitting: Neurology

## 2022-01-15 NOTE — Progress Notes (Signed)
Recommended Settings: Auto CPAP 7 through 16 cm with 3 cm EPR was sufficient. These settings can be changed on the current machine, which is not yet 70 years old, but 3 . The patient indicated that it would take getting used to a FFM, I will instead offer him a refit for a mask, or return to a nasal cradle / a pillow etc. with a chin strap, or a mouth tape.  1) CPAP mask desensitization procedures will be helpful in order to to assess if the patient can learn to tolerate this treatment. Consider referring the patient to the DME.

## 2022-01-15 NOTE — Procedures (Signed)
[facility] PAP TITRATION INTERPRETATION REPORT    STUDY DATE: 12/25/2021     PATIENT NAME: Donald [name]   DATE OF BIRTH: Jul 31, 1950  PATIENT ID: [id number]  TYPE OF STUDY: CPAP titration, exploring need for 02.   READING PHYSICIAN: Larey Seat, MD REFERRED BY: Dr Dagmar Hait and Dr Benn Moulder SCORING TECHNICIAN: Richard Miu, RPSGT  HISTORY: Donald [name] is a 71 year-old Male patient with headaches, left ventricular CHF, EF 35-40%. The patient had undergone Home Sleep Testing in May 2020 when he reached an AHI of 37.9/h , diagnosed with hypoxia and severe sleep apnea - and was prescribed an Auto CPAP device with 6-18 cm water pressure.  During his RV the device showed an AHI of 0.8/h but has still signs of possible hypoxemia. Compliance is high. His primary Internist is concerned about need of oxygen, persistent hypoxia as the patient 's memory declined. He used a nasal pillow but is open to changing to another interface.  The Epworth Sleepiness Scale was 3 out of 24 points on CPAP (scores above or equal to 10 are suggestive of hypersomnolence). ADDITIONAL INFORMATION: Height: 69.0 in Weight: 202 lb (BMI 29) Neck Size: 0.0 in  MEDICATIONS: Aldactone, Rapaflo, Crestor, Entresto, Protonix, Fish Oil, Toprol - XL, Glucophage, Proventil HFA, Vitamin C, Vitamin B 12, Aricept, Trulicity, Cymbalta, Proscar, Lasix, Vicodin, NovoLog, Tyler Aas, Namenda   DESCRIPTION: A registered sleep technologist (RPSGT) was in attendance for the duration of the recording. Data collection, scoring, video monitoring, and reporting were performed in compliance with the AASM Manual for the Scoring of Sleep and Associated Events; (Hypopnea is scored based on the criteria listed in Section VIII D. 1b in the AASM Manual V2.6 using a 4% oxygen desaturation rule or Hypopnea is scored based on the criteria listed in Section VIII D. 1a in the AASM Manual V2.6 using 3% oxygen desaturation  and /or arousal rule).    SLEEP CONTINUITY AND SLEEP ARCHITECTURE: Lights off was at 21:45: and lights on 05:31: (7.8 hours in bed). Total sleep time was 213.0 minutes (74.6% supine; 25.4% lateral; 0.0% prone, 0.0% REM sleep), with a <85, &quot;decreased&quot;, if('@1'$ <95, &quot;normal&quot;, &quot;high&quot;))"decreased sleep efficiency at 45.7%.  The patient started off under a Res Med P10 nasal pillow with 7 cm water pressure on CPAP< then the patient was switched to a FFM EVORA in s/m. There were no apnea or hypopneas of significance seen but oxygen saturation remained below 88% and was therefor supplemented: the patient was given 1 L/min 02 but soon developed respiratory events that were not seen prior to oxygen being added- and 02 was removed again , the CPAP pressure was escalated to 10 cm and tolerated, oxygen sats remained at 88%.  Sleep latency was <5, "decreased", if('@1'$ <30, "normal", "increased"))'normal at 21.5 minutes. Of the total sleep time, the percentage of stage N1 sleep was 9.2%, stage N2 sleep was 72.5%, stage N3 sleep was 18.3%, and REM sleep was 0.0%.  There were 0 Stage R periods observed on this study night, 12 awakenings (i.e. transitions to Stage W from any sleep stage), and 46.0 total stage transitions. Wake after sleep onset (WASO) time accounted for 231 minutes.  BODY POSITION: Duration of total sleep and percent of total sleep in their respective position is as follows: Sleep on the right for 04 minutes (2.1%), on the left 49 minutes (23.2%), and prone 00 minutes (0.0%). Total supine REM sleep time was 00 minutes (0.0% of total REM sleep). Sleep in supine position  was present for 159 minutes (74.6%), and total time in non-supine sleep position was 54.0 minutes (25.4%);  RESPIRATORY MONITORING: Based on CMS criteria (using a 4% oxygen desaturation rule for scoring hypopneas), there were 0 apneas (0 obstructive; 0 central; 0 mixed), and 9 hypopneas.  Apnea index was 0.0/h.  Hypopnea index was 2.5/h. The apnea-hypopnea index was 2.5/h overall. AHI was 3.0/h in supine, 0.0/h in non-supine sleep); REM AHI was 0.0/h, NREM AHI was 3/h . There were 0 respiratory effort-related arousals (RERAs) scored .  OXIMETRY: Oxyhemoglobin desaturation in sleep reached a nadir of 86% from a mean of 90%. Total sleep time spent at, or below 88% was 17.3 minutes, or 8.1% of total sleep time. Snoring was <5, "classified as", if('@2'$ <20, "intermittent", "frequent"))'classified as oral venting before a FFM was installed.  LIMB MOVEMENTS: There were 0 periodic limb movements of sleep (0.0/h), of which 0 (0.0/h) were associated with an arousal. AROUSALS: There were 27 arousals in total, for an arousal index of 7.6/hour. Of these, 2 were identified as respiratory-related arousals (0.6 /h), 0 were PLM-related arousals (0.0 /h), and 34 were non-specific arousals (9.6 /h) EKG: The electrocardiogram documented normal sinus rhythm. The average heart rate during sleep was 75 bpm. The heart rate during sleep varied between 63 and 99 BPM. EEG: symmetric amplitude and frequency throughout the recording with normal amplitude.  AUDIO and VIDEO : NO complex behavior, vocalization or parasomnia activity.     IMPRESSION:   1. Sleep-disordered breathing improved at the recommended pressure of 10 cmH2O with 3 cm EPR under a FFM EVORA in size S/M. Hypoxemia <5, "was resolved", if('@2'$ <20,"was mostly resolved", "was not resolved"))' improved with CPAP therapy, but was not resolved. Oxygen addition of only 1 L/ m was surprisingly not well tolerated.  the total time of hypoxemia under 10 cm water pressure on CPAP with 3 cm EPR was reduced to 5.5 minutes (!) and AHI was 1.2/h. The patient slept only in supine for this titration.   2. 70, &quot;Total sleep time was within normal limits&quot;, &quot;Total sleep time was normal&quot; ))"Total sleep time was significantly reduced. The fragmentation of sleep 75, &quot;improved  with positive airway pressure&quot;, &quot;improved significantly with positive airway pressure&quot; ))  "did not improve alongside titration to positive airway pressure. The patient never reached REM sleep.  3. <5, "No significant peridfic limb movements (PLMs) observed.", if('@3'$  <15, "Mild periodic limb movements (PLMS) observed.", if('@315'$ , "Frequent periodic limb movements (PLMs) of sleep were observed. ", "")))'No clinically significant periodic limb movements (PLMs) were observed. Most arousals from sleep were spontaneous, not of physiologic origin. RECOMMENDATIONS: Recommended Settings: Auto CPAP 7 through 16 cm with 3 cm EPR was sufficient. These settings can be changed on the current machine, which is not yet 70 years old, but 3 . The patient indicated that it would take getting used to a FFM, I will instead offer him a refit for a mask, or return to a nasal cradle / a pillow etc. with a chin strap, or a mouth tape.  1) CPAP mask desensitization procedures will be helpful in order to to assess if the patient can learn to tolerate this treatment. Consider referring the patient to the DME.   Larey Seat, MD Medical Director of [facility] at [facility]

## 2022-01-15 NOTE — Addendum Note (Signed)
Addended by: Larey Seat on: 01/15/2022 05:08 PM   Modules accepted: Orders

## 2022-01-15 NOTE — Telephone Encounter (Signed)
[facility] PAP TITRATION INTERPRETATION REPORT   STUDY DATE: 12/25/2021      PATIENT NAME:  Donald [name]         DATE OF BIRTH:  [birth year]  PATIENT ID:  [id number]    TYPE OF STUDY:  CPAP titration, exploring need for 02.   READING PHYSICIAN: Larey Seat, MD REFERRED BY: Dr Dagmar Hait and Dr Benn Moulder SCORING TECHNICIAN: Richard Miu, RPSGT   HISTORY: Donald [name] is a 71 year-old Male patient with headaches, left ventricular CHF, EF 35-40%.  The patient had undergone Home Sleep Testing in May 2020 when he reached an AHI of 37.9/h , diagnosed with hypoxia and severe sleep apnea - and was prescribed an Auto CPAP device with  6-18 cm water pressure.  During his RV the device showed  an AHI of 0.8/h but has still signs of possible hypoxemia. Compliance is high.  His primary Internist is concerned about need of oxygen, persistent hypoxia as the patient 's memory declined.  He used a nasal pillow but is open to changing to another interface.     The Epworth Sleepiness Scale was 3 out of 24 points on CPAP (scores above or equal to 10 are suggestive of hypersomnolence). ADDITIONAL INFORMATION:  Height: 69.0 in Weight: 202 lb (BMI 29) Neck Size: 0.0 in    MEDICATIONS: Aldactone, Rapaflo, Crestor, Entresto, Protonix, Fish Oil, Toprol - XL, Glucophage, Proventil HFA, Vitamin C, Vitamin B 12, Aricept, Trulicity, Cymbalta, Proscar, Lasix, Vicodin, NovoLog, Tyler Aas, Namenda   DESCRIPTION: A registered sleep technologist (RPSGT) was in attendance for the duration of the recording.  Data collection, scoring, video monitoring, and reporting were performed in compliance with the AASM Manual for the Scoring of Sleep and Associated Events; (Hypopnea is scored based on the criteria listed in Section VIII D. 1b in the AASM Manual V2.6 using a 4% oxygen desaturation rule or Hypopnea is scored based on the criteria listed in Section VIII D. 1a in the AASM Manual V2.6  using 3% oxygen desaturation and /or arousal rule).     SLEEP CONTINUITY AND SLEEP ARCHITECTURE:  Lights off was at 21:45: and lights on 05:31: (7.8 hours in bed). Total sleep time was 213.0 minutes (74.6% supine;  25.4% lateral;  0.0% prone, 0.0% REM sleep), with a decreased sleep efficiency at 45.7%.  The patient started off under a Res Med P10 nasal pillow with 7 cm water pressure on CPAP< then the patient was switched to a FFM EVORA in s/m. There were no apnea or hypopneas of significance seen but oxygen saturation remained below 88% and was therefor supplemented: the patient was given 1 L/min 02 but soon developed respiratory events that were not seen prior to oxygen being added- and 02 was removed again , the CPAP pressure was escalated to 10 cm and tolerated, oxygen sats remained at 88%.    Sleep latency was normal at 21.5 minutes.  Of the total sleep time, the percentage of stage N1 sleep was 9.2%, stage N2 sleep was 72.5%, stage N3 sleep was 18.3%, and REM sleep was 0.0%.  There were 0 Stage R periods observed on this study night, 12 awakenings (i.e. transitions to Stage W from any sleep stage), and 46.0 total stage transitions. Wake after sleep onset (WASO) time accounted for 231 minutes.  BODY POSITION: Duration of total sleep and percent of total sleep in their respective position is as follows: Sleep on the right for 04 minutes (2.1%), on  the left 49 minutes (23.2%), and prone 00 minutes (0.0%). Total supine REM sleep time was 00 minutes (0.0% of total REM sleep). Sleep in supine position was present for 159 minutes (74.6%), and total time in non-supine sleep position was 54.0 minutes (25.4%);  RESPIRATORY MONITORING:  Based on CMS criteria (using a 4% oxygen desaturation rule for scoring hypopneas), there were 0 apneas (0 obstructive; 0 central; 0 mixed), and 9 hypopneas.    Apnea index was 0.0/h. Hypopnea index was 2.5/h. The apnea-hypopnea index was 2.5/h overall. AHI was 3.0/h in supine,  0.0/h  in non-supine sleep); REM AHI was 0.0/h, NREM AHI was 3/h . There were 0 respiratory effort-related arousals (RERAs) scored .  OXIMETRY: Oxyhemoglobin desaturation in sleep reached a nadir of 86% from a mean of 90%. Total sleep time spent at, or below 88% was 17.3 minutes, or 8.1% of total sleep time. Snoring was classified as oral venting before a FFM was installed.  LIMB MOVEMENTS: There were 0 periodic limb movements of sleep (0.0/h), of which 0 (0.0/h) were associated with an arousal. AROUSALS: There were 27 arousals in total, for an arousal index of 7.6/hour.  Of these, 2 were identified as respiratory-related arousals (0.6 /h), 0 were PLM-related arousals (0.0 /h), and 34 were non-specific arousals (9.6 /h) EKG: The electrocardiogram documented normal sinus rhythm. The average heart rate during sleep was 75 bpm.  The heart rate during sleep varied between 63 and 99 BPM. EEG: symmetric amplitude and frequency throughout the recording with normal amplitude.  AUDIO and VIDEO : NO complex behavior, vocalization or parasomnia activity.     IMPRESSION:   1.  Sleep-disordered breathing improved at the recommended pressure of 10 cmH2O with 3 cm EPR under a FFM EVORA in size S/M.  Hypoxemia  improved with CPAP therapy, but was not resolved. Oxygen addition of only 1 L/ m was surprisingly not well tolerated.  the total time of hypoxemia under 10 cm water pressure on CPAP with 3 cm EPR was reduced to 5.5 minutes (!) and AHI was 1.2/h.  The patient slept only in supine for this titration.   2. Total sleep time was significantly reduced.  The fragmentation of sleep did not improve alongside titration to positive airway pressure. The patient never reached REM sleep.     3. No clinically significant periodic limb movements (PLMs) were observed. Most arousals from sleep were spontaneous, not of physiologic origin.       RECOMMENDATIONS: Recommended Settings:  Auto CPAP 7 through 16 cm with 3 cm  EPR was sufficient. These settings can be changed on the current machine, which is not yet 70 years old, but 3 . The patient indicated that it would take getting used to a FFM, I will instead offer him a refit for a mask, or return to a nasal cradle / a pillow etc. with a chin strap, or a mouth tape.    1) CPAP mask desensitization procedures will be helpful in order to to assess if the patient can learn to tolerate this treatment. Consider referring the patient to the DME.   Larey Seat,  MD Medical Director of [facility] at [facility]

## 2022-01-18 ENCOUNTER — Encounter: Payer: Self-pay | Admitting: Psychology

## 2022-01-18 NOTE — Progress Notes (Signed)
01/12/2022 4 PM-5 PM:  Today's visit was an in person visit that was conducted in my outpatient clinic office.  Today we reviewed the results of the recent neuropsychological evaluation which was a direct comparison between earlier neuropsychological evaluation and recent evaluation.  I have included a copy of the summary below for convenience and can be found in the patient's EMR dated 12/29/2021.  We went over the results and went into depth regarding recommendations going forward.  Reason For Service:                         This was a follow-up assessment after the patient was seen a little bit over a year ago by Dr. Darol Destine for neuropsychological evaluation and this is for repeat testing.  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  The patient also has a history of obstructive sleep apnea and had had previous falls but denied any tremors or other significant changes in gait or balance.  There were no descriptions of any visual hallucinations or delusions etc.  Today a new clinical interview was conducted with the patient and his wife.    Impression/Diagnosis:                     Results of the current neuropsychological evaluation are encouraging.  There is is only mild reduction in global cognitive functioning and the comparisons of the testing done in 2022 versus the current testing in 2023.  The patient has shown a little bit more slowing as far as information processing speed which was a specific weakness that he displayed on the first assessment.  The patient is showing maintained encoding capacity.  The patient is maintaining overall verbal comprehension skills quite well and the patient is also maintaining visual spatial capacity when  comparing 2022 versus 2023.  He is also maintaining efficient executive functioning.  For the most part most of the scores are within normal limits and generally consistent with premorbid intellectual abilities however there continue to be areas of relative weakness.  Variable encoding capacity, decreased information processing speed are noted.  The patient has significant medical history including multiple chronic diseases and vascular risk and while MRI results were consistent with some age-appropriate changes there were indications to some degree of microvascular ischemic changes that were stable between 2021 and 2022.  There was some general atrophy that was age consistent.  The patient has been compliant with his CPAP use and care for his obstructive sleep apnea but continues to have hypoxemia at times and uses oxygen.  Again, there does not appear to be any significant change or patterns of changes consistent with degenerative cortical or subcortical findings typical of condition such as Alzheimer's, Lewy body dementia or other subcortical dementia as but he does show findings that would be consistent with white matter involvement and subcortical involvement.  The most likely causative factors of this would be his cardiovascular issues including hypoxemia, obstructive sleep apnea along with ongoing issues with hypertension and type 2 diabetes.  He appears to be managing these underlying medical issues well and there is only very slight changes overall in cognitive functioning.  The patient appears to be more aware of the areas that he has difficulty and is managing these.  He does continue to  have some difficulty with emotional dyscontrol that became exacerbated when he discontinued his memantine and improved again when this was restarted.  He does appear to be responding well to this medication.  In any event, there does not appear to be indications of progressive degenerative conditions beyond the effects of  these long-term and chronic health issues and there negative impact on white matter and subcortical regions.  The patient would continue to meet the diagnostic criteria for a mild neurocognitive disorder due to multiple etiologies.   I will sit down with the patient and his family and go over the results.  Again, the current findings are quite encouraging.  The patient should remain quite diligent and focused on his overall health managing his type 2 diabetes, obstructive sleep apnea and difficulties maintaining adequate blood oxygen levels.  Regular physical activity a good dietary habits and continuing to work on good sleep patterns are imperative to limit and retard any further progression in his cognitive difficulties.   Diagnosis:                                Mild neurocognitive disorder due to multiple etiologies   OSA on CPAP   Stage 3 chronic kidney disease, unspecified whether stage 3a or 3b CKD (HCC)   Hypoxemia     _____________________ Ilean Skill, Psy.D. Clinical Neuropsychologist

## 2022-01-19 ENCOUNTER — Telehealth: Payer: Self-pay | Admitting: Neurology

## 2022-01-19 NOTE — Telephone Encounter (Signed)
-----   Message from Larey Seat, MD sent at 01/15/2022  5:10 PM EDT ----- This report did not transfer well through the new software- please read corrected report in phone note format:   Patient did not need oxygen at 10 cm water with 3 cm EPR and was given a FFM.  RECOMMENDATIONS: Recommended Settings: Auto CPAP 7 through 16 cm with 3 cm EPR was sufficient. These settings can be changed on the current machine, which is not yet 70 years old, but 3 . The patient indicated that it would take getting used to a FFM, I will instead offer him a refit for a mask, or return to a nasal cradle / a pillow etc. with a chin strap, or a mouth tape.  1) CPAP mask desensitization procedures will be helpful in order to to assess if the patient can learn to tolerate this treatment. Consider referring the patient to the DME.   Larey Seat, MD

## 2022-01-19 NOTE — Telephone Encounter (Signed)
Called the wife and spoke with her in detail about the sleep study and advised that through the study there was no indication for oxygen needed. When they placed the patient on oxygen there was respiratory event that took place and she ended up removing the oxygen. They increased the pressure to 10 cm water and after that the oxygen level remained at a good spot and this was with the patient also sleeping on the back. .  When I advised the wife of this she was very concerned because they note on several occasions where the oxygen level is staying below 88% at home and even with the neuro psych eval that he completed they mentioned the low oxygen could be the cause for memory problems.  Advised the patient's wife that Dr Brett Fairy will look into the sleep study to see if there is anything they can do.  In the meantime Dr Brett Fairy will have me increase the maximum pressure to 13 cm water pressure and that should help it seem like he is at 10 cm which hopefully will help with the oxygen staying above 88%. They will do this for now and she will share this information.  We may need to bring the pt in for a follow up for this to be discussed in detail with him.

## 2022-02-12 ENCOUNTER — Encounter: Payer: Self-pay | Admitting: Student

## 2022-02-21 ENCOUNTER — Ambulatory Visit: Payer: HMO | Admitting: Neurology

## 2022-02-21 ENCOUNTER — Encounter: Payer: Self-pay | Admitting: Neurology

## 2022-02-21 VITALS — BP 106/74 | HR 100 | Ht 69.0 in | Wt 199.0 lb

## 2022-02-21 DIAGNOSIS — G4733 Obstructive sleep apnea (adult) (pediatric): Secondary | ICD-10-CM

## 2022-02-21 DIAGNOSIS — G4736 Sleep related hypoventilation in conditions classified elsewhere: Secondary | ICD-10-CM | POA: Diagnosis not present

## 2022-02-21 DIAGNOSIS — R4701 Aphasia: Secondary | ICD-10-CM

## 2022-02-21 DIAGNOSIS — G4734 Idiopathic sleep related nonobstructive alveolar hypoventilation: Secondary | ICD-10-CM

## 2022-02-21 MED ORDER — MEMANTINE HCL 10 MG PO TABS
10.0000 mg | ORAL_TABLET | Freq: Two times a day (BID) | ORAL | 3 refills | Status: DC
Start: 2022-02-21 — End: 2022-08-24

## 2022-02-21 NOTE — Patient Instructions (Signed)
Home Oxygen Use, Adult When a medical condition keeps you from getting enough oxygen, your health care provider may instruct you to take extra oxygen at home. Your health care provider will let you know: When to take oxygen. How long to take oxygen. How quickly oxygen should be delivered (flow rate), in liters per minute (LPM or L/M). Home oxygen can be given through: A mask. A nasal cannula. This is a device or tube that goes in the nostrils. A transtracheal catheter. This is a small, thin tube placed in the windpipe (trachea). A breathing tube (tracheostomy tube) that is surgically placed in the windpipe. This may be used in severe cases. These devices are connected with tubing to an oxygen source, such as: A tank. Tanks hold oxygen in gas form. They must be replaced when the oxygen is used up. A liquid oxygen device. This holds oxygen in liquid form. Liquid oxygen is very cold. It must be replaced when the oxygen is used up. An oxygen concentrator machine. This filters oxygen in the room. There are two types of oxygen concentrator machines--stationary and portable. A stationary oxygen concentrator machine plugs into the main electricity supply at your home. You must have a backup cylinder of oxygen in case the power goes out. A portable oxygen concentrator machine is smaller in size and more lightweight. This machine uses battery supply and can be used outside the home. Work with your health care provider to find equipment that works best for you and your lifestyle. What are the risks? Delivery of supplemental oxygen is generally safe. However, some risks include: Fire. This can happen if the oxygen is exposed to a heat source, flame, or spark. Injury to skin. This can happen if liquid oxygen touches your skin. Damage to the lungs or other organs. This can happen from getting too little or too much oxygen. Supplies needed: To use oxygen, you will need: A mask, nasal cannula, transtracheal  catheter, or tracheostomy. An oxygen tank, a liquid oxygen device, or an oxygen concentrator. The tape that your health care provider recommends (optional). Your health care provider may also recommend: A humidifier to warm and moisten the oxygen delivered. This will depend on how much oxygen you need and the type of home oxygen device you use. A pulse oximeter. This device measures the percentage of oxygen in your blood. How to use oxygen Your health care provider or a person from your medical device company will show you how to use your oxygen device. Follow his or her instructions. The instructions may look something like this: Wash your hands with soap and water. If you use an oxygen concentrator, make sure it is plugged in. Place one end of the tube into the port on the tank, device, or machine. Place the mask over your nose and mouth. Or, place the nasal cannula and secure it with tape if instructed. If you use a tracheostomy or transtracheal catheter, connect it to the oxygen source as directed. Make sure the liter-flow setting on the machine is at the level prescribed by your health care provider. Turn on the machine or adjust the knob on the tank or device to the correct liter-flow setting. When you are done, turn off and unplug the machine, or turn the knob to OFF. How to clean and care for the oxygen supplies Nasal cannula Clean it with a warm, wet cloth daily or as needed. Wash it with a liquid soap once a week. Rinse it thoroughly once or twice a   week. Air-dry it. Replace it every 2-4 weeks. If you have an infection, such as a cold or pneumonia, change the cannula when you get better. Mask Replace it every 2-4 weeks. If you have an infection, such as a cold or pneumonia, change the mask when you get better. Humidifier bottle Wash the bottle between each refill: Wash it with soap and warm water. Rinse it thoroughly. Clean it and its top with a disinfectant cleaner. Air-dry  it. Make sure it is dry before you refill it. Oxygen concentrator Clean the air filter at least twice a week according to directions from your home medical equipment and service company. Wipe down the cabinet every day. To do this: Unplug the unit. Wipe down the cabinet with a damp cloth. Dry the cabinet. Other equipment Change any extra tubing every 1-3 months. Follow instructions from your health care provider about taking care of any other equipment. Safety tips Fire safety tips  Keep your oxygen and oxygen supplies at least 6 ft (2 m) away from sources of heat, flames, and sparks at all times. Do not allow smoking near your oxygen. Put up "no smoking" signs in your home. Avoid smoking areas when in public. Do not use materials that can burn (are flammable) while you use oxygen. This includes: Petroleum jelly. Hair spray or other aerosol sprays. Rubbing alcohol. Hand sanitizer. When you go to a restaurant with portable oxygen, ask to be seated in the non-smoking section. Keep a fire extinguisher close by. Let your fire department know that you have oxygen in your home. Test your home smoke detectors regularly. Traveling Secure your oxygen tank in the vehicle so that it does not move around. Follow instructions from your medical device company about how to safely secure your tank. Make sure you have enough oxygen for the amount of time you will be away from home. If you are planning to travel by public transportation (airplane, train, bus, or boat), contact the company to find out if it allows the use of an approved portable oxygen concentrator. You may also need documents from your health care provider and medical device company before you travel. General safety tips If you use an oxygen cylinder, make sure it is in a stand or secured to an object that will not move (fixed object). If you use liquid oxygen, make sure its container is kept upright at all times. If you use an oxygen  concentrator: Tell your electric company. Make sure you are given priority service in the event that your power goes out. Avoid using extension cords if possible. Follow these instructions at home: Use oxygen only as told by your health care provider. Do not use alcohol or other drugs that make you relax (sedating drugs) unless instructed. They can slow down your breathing rate and make it hard to get in enough oxygen. Know how and when to order a refill of oxygen. Always keep a spare tank of oxygen. Plan ahead for holidays when you may not be able to get a prescription filled. Use water-based lubricants on your lips or nostrils. Do not use oil-based products like petroleum jelly. To prevent skin irritation on your cheeks or behind your ears, tuck some gauze under the tubing. Where to find more information [employer]: www.lung.org/oxygen Contact a health care provider if: You get headaches often. You have a lasting cough. You are restless or have anxiety. You develop an illness that affects your breathing. You cannot exercise at your regular level. You   have a fever. You have persistent redness under your nose. Get help right away if: You are confused. You are sleepy all the time. You have blue lips or fingernails. You have difficult or irregular breathing that is getting worse. You are struggling to breathe. These symptoms may represent a serious problem that is an emergency. Do not wait to see if the symptoms will go away. Get medical help right away. Call your local emergency services (911 in the [place] Do not drive yourself to the hospital. Summary Your health care provider or a person from your medical device company will show you how to use your oxygen device. Follow his or her instructions. If you use an oxygen concentrator, make sure it is plugged in. Make sure the liter-flow setting on the machine is at the level prescribed by your health care provider. Use  oxygen only as told by your health care provider. Keep your oxygen and oxygen supplies at least 6 ft (2 m) away from sources of heat, flames, and sparks at all times. This information is not intended to replace advice given to you by your health care provider. Make sure you discuss any questions you have with your health care provider. Document Revised: 08/25/2021 Document Reviewed: 04/22/2019 [employer] Patient Education  2023 [employer]  

## 2022-02-21 NOTE — Progress Notes (Signed)
SLEEP MEDICINE CLINIC    Provider:  Larey Seat, MD  Primary Care Physician:  Prince Solian, [place] [address]     Referring Provider: Prince Solian, [place],  [place] [address]          Chief Complaint according to patient   Patient presents with:     New Patient (Initial Visit)     Follow -up evaluation of memory loss in established Sleep patient.  Confusional episodes, forgetting short term , forgetting numbers, executive failures.  DME [facility] ([facility]) .  Hypoxemia while on CPAP , needs nocturnal oxygen       HISTORY OF PRESENT ILLNESS:  Donald [name] is a 71 y.o. year old Caucasian male patient seen here in a RV on 02/21/2022.  The patient was tested in a home sleep test  in 2020, now retested by CPP titration in lab:  Study date was 25 December 2021 and the patient had a previous home sleep test that had shown an AHI of 37.9 with associated hypoxia and prescribed an auto CPAP titration device he was now invited for an in lab titration also to see if oxygen is in addition noted he slept in supine position for much of the night and apnea hypopnea index was 2.5 overall to my great surprise.  He was a started on 7 cmH2O CPAP switched to a full facemask during the titration and was given 1 L of oxygen the oxygen was later removed again as the CPAP pressure was escalated to 10 cm and oxygen sats remained  about 88%. Final pressure was 17 cm water.  We reset his CPAP to 5-16 cm water, 3 cm EPR and he was not given a script for oxygen. Donald [name] has used oxygen for almost 10 years now since he was diagnosed with hypoxemia in 2014.  He has relied on oxygen when he travels to be brought to his destination, he uses an oxygen concentrator at home but of course that 1 does not allow him to travel with.  There were also several nights documented on the medical grade oxygen pulse oximetry and they show that the patient had  between 63 and 40% of sleep at night in hypoxemia that means at 89 or below saturation.   Based on these data which can be variable from night to night I think that the patient should continue his oxygen at 4 L and that it should be bled into the CPAP with the new settings.   His machine was currently 66-1/70 years old so it has a good 18 months to go.       07-19-2021: The patient underwent a sleep study by home sleep test and was diagnosed with severe sleep apnea.  He had an AHI of 37.9/h there were over 270 minutes of sleep and hypoxia there were also evidence of loud snoring and that the patient had the highest AHI when sleeping on his right side.  He was therefore given an auto titration capable CPAP device with a setting from 6-18 cmH2O 3 cm EPR and we discussed that oxygen may have to be bled into his device.  The patient has used his auto titration device compliantly.  He is using a minimum pressure of 7 and a maximum pressure of 10 cm water and over the last 30 days had 100% compliance.  All of these nights over 4 hours of use.  Average pressure at 95th  percentile 8.2 cmH2O which is provided average AHI is 0.8/h which is an excellent resolution of apnea.  The patient's home sleep test was performed in May 2022 is almost 71 years old and his CPAP seems to serve him well in terms of his apnea control.  His sleepiness is controlled but he needs oxygen to sleep well.   Cognitive impairment : the patient d/c aricept due to nausea.   He will follow with Dr. Benn Moulder  soon for his reduced EF.      05-10-2021: Still reports headaches in cycles. New diagnoses of CHF, swelling to the legs , abdominal distention. Dr Benn Moulder stated left ventricular dysfunction. No more alcohol. Fatty liver. Renal dysfunction, IGA nephropathy. Pulmonologist ordered a test for cardiac function, echo-   Here to f/u for memory. Pt reports memory has been stable, no changes. Pt has been tolerating Memantine with no SE.  Pt  started new CPAP just last week and feels like pressure is a little to high. MOCA score 27/ 30 points. Still reports headaches in cycles. History of hypoxia and sleep apnea, AHI 37/ h by HST on 10-03-2018.   Echo report and neurocognitive repot reviewed and MRI/PET scan reviewed.   IMPRESSIONS Left ventricular ejection fraction, by estimation, is 35 to 40%. The left ventricle has moderately decreased function. The left ventricle demonstrates global hypokinesis. Left ventricular diastolic parameters are consistent with Grade I diastolic dysfunction (impaired relaxation). 1. Right ventricular systolic function is low normal. The right ventricular size is normal. There is normal pulmonary artery systolic pressure. The estimated right ventricular systolic pressure is 17.7 mmHg. 2. 3. The mitral valve is grossly normal. Trivial mitral valve regurgitation. 4. The aortic valve is tricuspid. Aortic valve regurgitation is not visualized. The inferior vena cava is normal in size with greater than 50% respiratory variability, suggesting right atrial pressure of 3 mmHg. 5. Comparison(s): Prior images unable to be directly viewed, comparison made by report only. Changes from prior study are noted. 09/06/2011: LVEF 35-45%. FINDINGS Left Ventricle: Left ventricular ejection fraction, by estimation, is 35 to 40%. The left ventricle has moderately decreased function. The left ventricle demonstrates global hypokinesis. The left ventricular internal cavity size was normal in size. There is no left ventricular hypertrophy. Left ventricular diastolic parameters are consistent with Grade I diastolic dysfunction (impaired relaxation). Indeterminate filling pressures.  06/ 13/ 2022.   Donald [name] has had to stop the Aricept due to nausea and increased headaches.  After he stopped the Aricept nausea Better he still has headaches but normal 6 months.  Headaches have been responding to ibuprofen but he was warned  not to take ibuprofen due to kidney disease he feels that alternating with Tylenol has not had the desired effect,.  For a urinary tract infection at the end of May he was treated with antibiotics that he did not tolerate.  He was given a prednisone Dosepak which made his otherwise more likely inflammatory symptoms disappear and he felt much better, less in pain and also mentally clear and more relaxed.   Since this steroid Dosepak is now completed as he has seen a repeat return of some of his aches and pains.  Neuropsychology reports for this patients, encourage exercise, retesting need for oxygen ( he is on 02 )  in a sleep study and SPECT scan order. Impulse control is lower than expected, pretty much every cognitive domain was average or even above average except over 20 minute recall and color naming.  He is not  seen by pulmonology for oxygen need- small vessel disease of the brain may be related.  BRAIN MRI was unchanged -05-15-2020 Dr. Felecia Shelling, small vessel disease.   Donald [name] has been a compliant CPAP user at 97%, these are data from November through December 2021 however I am not quite sure what happened to the more recent data. Residual AHI is 2.5 how can I link my epic -integrated dragon to my NUANCE PRO handheld microphone? Donald [name], M.D. 95th percentile air leak is 7.7 L, 95th percentile pressure is 9.5 cmH2O.  All remaining or residual apneas were obstructive.  And with a minimum pressure of 7 maximum pressure of 10 and 2 cm EPR -there needs to be no reset.   04-15-2020: Donald [name] is a 71 y.o. male , seen here as in a referral from [name] for a new evaluation of apnea. Patient is a highly compliant CPAP user since receiving a diagnosis of mild OSA and severe Hypoxemia in 2015. Here today not only to follow up for sleep, but with a concern about memory decline.  Donald [name] is here today with his spouse both voiced a concern about his short-term memory function, and  he often has to look at instructions calendars for days again and again because he tends to forget them within minutes he reports.  His primary care physician [name] had already ordered MRI of the brain with CT clinical data of memory loss over the last 2 years.  The patient is status post cervical fusion procedure beginning at C3 and he had no acute findings on his MRI dated 11/22-2021.   Age-related volume loss was commented on mild chronic small vessel ischemic changes, tortuous and ectatic vessels were noted but no aneurysm or calcification or stenosis.  And left mastoid effusion was coincidentally also commented upon this MRI of the brain was done without contrast probably due to the patient's history of a chronic renal impairment. It is his wife's impression that he mind is getting more and more foggy and he unsure about driving, he has a continuous headache.     PAST VISIT :  Donald [name] is a meanwhile 71 year old right-handed Caucasian gentleman and avid motorcyclist.  He was first seen for a sleep evaluation in May 2013 by Dr. Danton Sewer.. This was followed by a sleep study dated 27 October 2011.  He was not happy with the results and care but was given an oxygen concentrator from [place] he remembers that the oxygen was delivered already in March 2013 after an overnight pulse oximetry.  A new sleep evaluation was performed through me at [facility] in December 2014 and he underwent another sleep study here.  On 25 April 2013 his sleep study showed an overall AHI of only 5.7/h, and very mild degree of apnea but his oxygen nadir was 77% and he spent sustained time in low oxygen.  The total hypoxemia time at 89 and below was 2 hours and 45 minutes, the patient was therefore placed on oxygen supplement which he already owns as I understand.  He returned for a CPAP titration because I can only order oxygen as an adjunct therapy to obstructive sleep apnea or central  sleep apnea therapy.  Oxygen strictly for hypoxemia with exercise and not sleep-related would need to be provided by a pulmonologist.   CPAP titration followed on 04 July 2013 his AHI was reduced to 1.9 /h.  He reported significant difficulties to  stay asleep.  His sleep latency was 65 minutes in the sleep lab.  I understand that the patient has continued to use his CPAP on a regular basis in spite of not having followed up with me.  The CPAP settings that he uses now at the same but he no longer has oxygen available.  He purchased I understand a pulse oximetry device and has used it for nights with CPAP in place and 1 night without CPAP use.  All nights had prolonged severely prolonged hypoxemia.  This only confirms what we saw in 2015 that his apnea is not the main driver of hypoxemia.  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  GERD, heart murmur, hematuria, hypercholesterolemia and hypertension, history of prostatitis, community-acquired pneumonia.  Coronary artery disease evaluation was negative with an EF of 55% in 2013.   Donald [name] struggles with sinus congestion often has headaches when sleeping on his left side feeling congested and developing a pressure behind the eyes, he has nocturia 2-3 times each night, he appears depressed may be frustrated, stating that he is not excessively daytime sleepy but he also does not feel refreshed or restored in the mornings.  Family medical and sleep history: His father died at age 70 of coronary artery disease he also had diabetes type 2 and degenerative joint joint disease.  His mother died also early at 49 years of age had a history of nephrolithiasis and coronary artery disease.  His sister who is 6 years younger than the patient has thyroid problems  she has undergone bariatric surgery for morbid obesity..   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  he has a son age 81, he is married since 1980,  he drinks coffee in the morning and crystallite with caffeine after 3 PM in the afternoon he has no history of shiftwork. No more alcohol. Fatty liver. Renal dysfunction, IGA nephropathy.    Sleep habits are as follows: The patient no longer has oxygen available but uses his CPAP at 9 cm water pressure which is now over 40 years old and he still using nasal pillows.  He describes a dinnertime at around 7 PM bedtime is usually around 9:30 PM always with CPAP, the bedroom is described as cool, quiet and dark he shares a bedroom with his wife.  He falls asleep usually within 15 minutes he has begun trying to sleep supine which for an apnea patient is not a preferred sleep position but he uses 3 pillows under wedge to keep himself propped up.  He states that when he sleeps on one side sinus congestion sets in and his nose.  Up.  Usually worse when he sleeps on his left side.  He wakes up 2-3 times each night for nocturia and finally rises at 8 AM but only with alarm.  He does not feel refreshed and restored spontaneously and without alarm would be sleeping until noon.  He does not nap in daytime he reports.  He has sometimes been able to fall asleep in a recliner when he is reading or relaxed.  He has isolated occurrences of sleep paralysis.  As I described he has had multiple pulse oximetry's and perform these on himself with and without CPAP in place and found that he is still significantly hypoxic at this time. Memory los:   Impression/Diagnosis:                     Results of the current neuropsychological  evaluation are encouraging.  There is is only mild reduction in global cognitive functioning and the comparisons of the testing done in 2022 versus the current testing in 2023.  The patient has shown a little bit more slowing as far as information processing speed which was a specific weakness that he displayed on the first assessment.  The patient is showing maintained encoding capacity.  The patient is maintaining overall verbal comprehension skills quite well and the patient is also maintaining visual spatial capacity when comparing 2022 versus 2023.  He is also maintaining efficient executive functioning.  For the most part most of the scores are within normal limits and generally consistent with premorbid intellectual abilities however there continue to be areas of relative weakness.  Variable encoding capacity, decreased information processing speed are noted.  The patient has significant medical history including multiple chronic diseases and vascular risk and while MRI results were consistent with some age-appropriate changes there were indications to some degree of microvascular ischemic changes that were stable between 2021 and 2022.  There was some general atrophy that was age consistent.  The patient has been compliant with his CPAP use and care for his obstructive sleep apnea but continues to have hypoxemia at times and uses oxygen.  Again, there does not appear to be any significant change or patterns of changes consistent with degenerative cortical or subcortical findings typical of condition such as Alzheimer's, Lewy body dementia or other subcortical dementia as but he does show findings that would be consistent with white matter involvement and subcortical involvement.    Montreal Cognitive Assessment  05/10/2021 04/15/2020  Visuospatial/ Executive (0/5) 5 4  Naming (0/3) 3 3  Attention: Read list of digits (0/2) 2 2  Attention: Read list of letters (0/1) 1 1  Attention: Serial 7 subtraction  starting at 100 (0/3) 2 3  Language: Repeat phrase (0/2) 2 2  Language : Fluency (0/1) 1 1  Abstraction (0/2) 2 2  Delayed Recall (0/5) 2 out of 5 2  Orientation (0/6) 6 6  Total 27 26      10/18/2020    9:18 AM 04/15/2020    9:42 AM  MMSE - Mini Mental State Exam  Orientation to time 4 5  Orientation to Place 5 5  Registration 3 3  Attention/ Calculation 5 5  Recall 2 3  Language- name 2 objects 2 2  Language- repeat 1 1  Language- follow 3 step command 3 3  Language- read & follow direction 1   Write a sentence 1   Copy design 1   Total score 28      Out of a complete 14 system review, the patient complains of only the following symptoms, and all other reviewed systems are negative.:  Fatigue, headaches, irritability, impulse control failure, now angry, with personality changes.    How likely are you to doze in the following situations: 0 = not likely, 1 = slight chance, 2 = moderate chance, 3 = high chance   Sitting and Reading? Watching Television? Sitting inactive in a public place (theater or meeting)?  As a passenger in a car for an hour without a break? Lying down in the afternoon when circumstances permit? Sitting and talking to someone? Sitting quietly after lunch without alcohol? In a car, while stopped for a few minutes in traffic?   Total =3/ 24 points  down from 8 points.   FSS endorsed at 29/ 63 points.  GDS not answered.   MOCA 26/ 30 points. 04-2021, next MOCA due in 12 months.  REsMed mask, LUNA G 3 machine.   Cognitive ; ankle edema , coughing.    Social History   Socioeconomic History   Marital status: Married    Spouse name: Diane   Number of children: 1   Years of education: 14   Highest education level: Not on file  Occupational History   Occupation: Herbalist: Interior and spatial designer  Tobacco Use   Smoking status: Never   Smokeless tobacco: Never   Tobacco comments:    Second hand smoke for 26 years per pt  Vaping Use    Vaping Use: Never used  Substance and Sexual Activity   Alcohol use: No    Alcohol/week: 0.0 standard drinks of alcohol   Drug use: No   Sexual activity: Not on file  Other Topics Concern   Not on file  Social History Narrative   Patient is married (Diane) and lives at home with his wife.   Patient has one child.   Patient is working full-time.   Patient has a Geophysicist/field seismologist.   Patient is left-handed.   Patient drinks one glass of tea daily, one soda daily.   Social Determinants of Health   Financial Resource Strain: Not on file  Food Insecurity: Not on file  Transportation Needs: Not on file  Physical Activity: Not on file  Stress: Not on file  Social Connections: Not on file    Family History  Problem Relation Age of Onset   Coronary artery disease Father    Diabetes type II Father    Heart attack Father     Past Medical History:  Diagnosis Date   Allergic rhinitis, seasonal    Asthma    Bronchitis    Chronic kidney disease, stage I    collar bone    dislocation left side 05/2014   Degenerative joint disease of knee    bilateral   Diabetes mellitus    GERD (gastroesophageal reflux disease)    Heart murmur    history of   History of acute prostatitis    History of hematuria    Hyperlipidemia    Hypertension    IgA nephropathy    OSA on CPAP    Pneumonia    Sinusitis     Past Surgical History:  Procedure Laterality Date   BIOPSY  05/17/2020   Procedure: BIOPSY;  Surgeon: Clarene Essex, MD;  Location: [facility];  Service: Endoscopy;;   CARDIAC CATHETERIZATION  09/29/2011   normal   CERVICAL SPINE SURGERY     COLONOSCOPY WITH PROPOFOL N/A 05/17/2020   Procedure: COLONOSCOPY WITH PROPOFOL;  Surgeon: Clarene Essex, MD;  Location: [facility];  Service: Endoscopy;  Laterality: N/A;   deviated septum  1930s   ESOPHAGOGASTRODUODENOSCOPY (EGD) WITH PROPOFOL N/A 05/17/2020   Procedure: ESOPHAGOGASTRODUODENOSCOPY (EGD) WITH PROPOFOL;  Surgeon: Clarene Essex,  MD;  Location: [facility];  Service: Endoscopy;  Laterality: N/A;   HEMOSTASIS CLIP PLACEMENT  05/17/2020   Procedure: HEMOSTASIS CLIP PLACEMENT;  Surgeon: Clarene Essex, MD;  Location: [facility];  Service: Endoscopy;;   HERNIA REPAIR     LEFT HEART CATHETERIZATION WITH CORONARY ANGIOGRAM N/A 09/29/2011   Procedure: LEFT HEART CATHETERIZATION WITH CORONARY ANGIOGRAM;  Surgeon: Sanda Klein, MD;  Location: [facility];  Service: Cardiovascular;  Laterality: N/A;   NM MYOVIEW LTD  07/08/2011   mild LV dilatation,mild septal hypokinesia   POLYPECTOMY  05/17/2020   Procedure: POLYPECTOMY;  Surgeon: Clarene Essex, MD;  Location: [facility];  Service: Endoscopy;;   TONSILLECTOMY     US ECHOCARDIOGRAPHY  09/07/2011   EF 35-45%,mod.ant hypokinesis,boderline LA & RA enlargement,trace MR,TR & PI     Current Outpatient Medications on File Prior to Visit  Medication Sig Dispense Refill   albuterol (PROVENTIL HFA;VENTOLIN HFA) 108 (90 BASE) MCG/ACT inhaler Inhale 2 puffs into the lungs every 6 (six) hours as needed for wheezing or shortness of breath. For shortness of breath     Continuous Blood Gluc Sensor (FREESTYLE LIBRE 14 DAY SENSOR) MISC      Cyanocobalamin (VITAMIN B12) 500 MCG TABS Take 500 mcg by mouth daily.     DULoxetine (CYMBALTA) 30 MG capsule Take 90 mg by mouth daily.     finasteride (PROSCAR) 5 MG tablet Take 5 mg by mouth daily.     furosemide (LASIX) 20 MG tablet Take 1 tablet (20 mg total) by mouth daily in the afternoon. 90 tablet 3   HYDROcodone-acetaminophen (NORCO/VICODIN) 5-325 MG tablet Take one tablet by mouth every 6 hours for severe pain Dx: R07.51     Insulin Aspart FlexPen (NOVOLOG) 100 UNIT/ML Inject 40-60 Units into the skin as needed.     insulin degludec (TRESIBA FLEXTOUCH) 200 UNIT/ML FlexTouch Pen Inject 90 Units into the skin daily in the afternoon.     Insulin Pen Needle (UNIFINE PENTIPS PLUS) 32G X 4 MM MISC USE WITH TRESIBA DAILY     loratadine (CLARITIN) 10 MG  tablet Take 10 mg by mouth daily.     memantine (NAMENDA) 10 MG tablet Take 1 tablet (10 mg total) by mouth 2 (two) times daily. 60 tablet 11   metFORMIN (GLUCOPHAGE) 1000 MG tablet Take 1 tablet (1,000 mg total) by mouth 2 (two) times daily with a meal. Restart on 8/22     metoprolol succinate (TOPROL-XL) 50 MG 24 hr tablet Take 50 mg by mouth 2 (two) times daily. Take with or immediately following a meal.     Multiple Vitamins-Minerals (CENTRUM SILVER 50+MEN PO) Take 1 tablet by mouth daily in the afternoon.     Omega-3 Fatty Acids (FISH OIL) 1000 MG CPDR Take 2,000 Units by mouth daily.     pantoprazole (PROTONIX) 40 MG tablet Take 1 tablet (40 mg total) by mouth 2 (two) times daily. 180 tablet 0   sacubitril-valsartan (ENTRESTO) 97-103 MG Take 1 tablet by mouth 2 (two) times daily. 180 tablet 3   silodosin (RAPAFLO) 8 MG CAPS capsule Take 8 mg by mouth daily.     rosuvastatin (CRESTOR) 20 MG tablet Take 1 tablet (20 mg total) by mouth daily. 90 tablet 3   spironolactone (ALDACTONE) 25 MG tablet Take 1 tablet (25 mg total) by mouth daily. 90 tablet 3   No current facility-administered medications on file prior to visit.    Allergies  Allergen Reactions   Ciprofloxacin     Tendons problem in shoulder Other reaction(s): Arthralgias (intolerance), Myalgias (intolerance)   Empagliflozin Anaphylaxis    Dehydration   Levofloxacin     Other reaction(s): Arthralgias (intolerance), Myalgias (intolerance)   Aricept [  Donepezil Hcl] Nausea Only   Bactrim [Sulfamethoxazole-Trimethoprim] Hives   Levofloxacin Other (See Comments)    Muscle tighten up in left and right calf   Misc. Sulfonamide Containing Compounds Rash    Physical exam:  Today's Vitals   02/21/22 1317  BP: 106/74  Pulse: 100  Weight: 199 lb (90.3 kg)  Height: $Remove'5\' 9"'IjdezZD$  (1.753 m)   Body mass index is 29.39 kg/m.   Wt Readings from Last 3 Encounters:  02/21/22 199 lb (90.3 kg)  12/28/21 198 lb 6.4 oz (90 kg)  10/21/21 200  lb (90.7 kg)     Ht Readings from Last 3 Encounters:  02/21/22 $RemoveB'5\' 9"'GOjBCyIb$  (1.753 m)  12/28/21 $RemoveB'5\' 9"'tzaypssZ$  (1.753 m)  10/21/21 $RemoveB'5\' 9"'VvCwpqNL$  (1.753 m)      General: The patient is awake, alert and appears not in acute distress. The patient is well groomed. Head: Normocephalic, atraumatic. Cardiovascular:  Regular rate and cardiac rhythm by pulse,  without distended neck veins. Respiratory: Skin:  evidence of ankle edema, or rash. He appears jaundiced.  Trunk: The patient's posture is erect.  raspy voice, hoarseness, dysphonia.  Neurologic exam : The patient is awake and alert, oriented to place and time.   Memory subjective described as impaired-     05/10/2021    3:41 PM 04/15/2020    9:51 AM  Montreal Cognitive Assessment   Visuospatial/ Executive (0/5) 5 4  Naming (0/3) 3 3  Attention: Read list of digits (0/2) 2 2  Attention: Read list of letters (0/1) 1 1  Attention: Serial 7 subtraction starting at 100 (0/3) 2 3  Language: Repeat phrase (0/2) 2 2  Language : Fluency (0/1) 1 1  Abstraction (0/2) 2 2  Delayed Recall (0/5) 2 2  Orientation (0/6) 6 6  Total 26 26      Attention span & concentration ability appears restricted.  Speech is fluent,  without  dysarthria,  aphasia.  Mood and affect are here normal, but wife reports episode of inappropriate anger, blunt comments. .   Cranial nerves: no loss of smell or taste reported  Pupils are equal and briskly reactive to light. Funduscopic exam deferred.  Extraocular movements in vertical and horizontal planes were intact and without nystagmus.  No Diplopia. Visual fields by finger perimetry are intact. Hearing was intact to soft voice and finger rubbing.    Facial sensation intact to fine touch.  Facial motor strength is symmetric and tongue and uvula move midline.  Neck ROM : rotation, tilt and flexion extension were normal for age and ligament damage-shoulder shrug was symmetrical- but he reports shoulder pain, arthritis, having lost arm  swing. .    Motor exam:  Symmetric bulk, tone and ROM.   Normal tone without cog- wheeling, symmetric grip strength .   Sensory:  Fine touch and vibration were lost in both feet /ankles.  Proprioception tested in the upper extremities was normal.   Coordination: The Finger-to-nose maneuver was intact without evidence of ataxia, dysmetria or tremor.   Gait and station: Patient could rise unassisted from a seated position, walked without assistive device.  Stance is of normal width/ base and the patient turned with 4 steps.  He walked with reduced arm swing on the left versus right  Toe and heel walk were deferred.  Deep tendon reflexes: in the  upper and lower extremities are symmetric and trace.      After spending a total time of 25 minutes face to face and additional time for physical  and neurologic examination, review of laboratory studies,  personal review of imaging studies, reports and results of other testing and review of referral information / records as far as provided in visit, I have established the following assessments:  1)  OSA- severe and well controlled on new auto CPAP, this is a replacement machine.  He uses nasal pillows and pressures between 7-16 cm water.  Total sleep latency was found to be 65 minutes in sleep lab during CPAP titration in 06/2013.   Oxygen for hypoxemia  remains ordered- here were also several nights documented on the medical grade oxygen pulse oximetry and they show that the patient had between 63 and 40% of sleep at night in hypoxemia that means at 89 or below saturation.   4 overnight pulse-oximeters. 2 with hypoxia for long periods of time, 2 with less than 8 % of sleep time I hypoxia.    Based on these data which can be variable from night to night I think that the patient should continue his oxygen at 4 L and that it should be bled into the CPAP with the new settings.   His machine was currently 12-1/70 years old so it has a good 18 months to go.   New CPAP just delivered last days of Dec 2022. with nasal pillows. He has been using compliantly 100%.    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 Ordered PET scan, metabolic MRI- MOCA was indicative of MCI 26/ 30 points, almost "normal" . 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   My Plan is to proceed with:  1)  OSA with hypoxia on CPAP auto -titrator , highly compliant, with low residual AH.I- had 4 ONOs which confirmed hypoxia on CPAP - continues to use 02. Pulmonology was surprised by his activity based test in office with  high oxygen saturation.  and on 4 liters of 02 at nigh only , to be bled into CPAP.  2) Retest every 6 months with MOCA. Encourage hydration, exercises, supportive shoes. continue Water exercise.   Rv in 6 months with NP   I would like to thank  Prince Solian, [address] [place],  [place] [id number] for allowing me to meet with and to take care of this pleasant patient.   In short, ANZEL KEARSE is presenting with slowly progressing memory loss.   Next Sleep clinic RV through our NP in 16 months and bring CPAP with you!. We will do a MOCA.    Electronically signed by: Larey Seat, MD 02/21/2022 1:52 PM  [facility] Board certified by The [employer] of Sleep Medicine and Diplomate of the [employer] of Sleep Medicine. Board certified In Neurology through the [employer], Fellow of the [employer] of Neurology. Medical Director of [employer] [facility].

## 2022-02-22 ENCOUNTER — Ambulatory Visit: Payer: HMO | Admitting: Student

## 2022-02-22 ENCOUNTER — Telehealth: Payer: Self-pay

## 2022-03-20 ENCOUNTER — Ambulatory Visit: Payer: HMO | Admitting: Psychology

## 2022-03-29 ENCOUNTER — Ambulatory Visit: Payer: HMO | Admitting: Psychology

## 2022-03-31 ENCOUNTER — Other Ambulatory Visit: Payer: Self-pay | Admitting: Cardiovascular Disease

## 2022-04-07 ENCOUNTER — Encounter: Payer: Self-pay | Admitting: Cardiovascular Disease

## 2022-04-07 ENCOUNTER — Ambulatory Visit: Payer: HMO | Attending: Cardiovascular Disease | Admitting: Cardiovascular Disease

## 2022-04-07 VITALS — BP 90/64 | HR 88 | Ht 68.0 in | Wt 196.0 lb

## 2022-04-07 DIAGNOSIS — E119 Type 2 diabetes mellitus without complications: Secondary | ICD-10-CM | POA: Diagnosis not present

## 2022-04-07 DIAGNOSIS — Z794 Long term (current) use of insulin: Secondary | ICD-10-CM

## 2022-04-07 DIAGNOSIS — R55 Syncope and collapse: Secondary | ICD-10-CM

## 2022-04-07 DIAGNOSIS — E782 Mixed hyperlipidemia: Secondary | ICD-10-CM

## 2022-04-07 DIAGNOSIS — I5042 Chronic combined systolic (congestive) and diastolic (congestive) heart failure: Secondary | ICD-10-CM

## 2022-04-07 DIAGNOSIS — I2510 Atherosclerotic heart disease of native coronary artery without angina pectoris: Secondary | ICD-10-CM | POA: Diagnosis not present

## 2022-04-07 DIAGNOSIS — I10 Essential (primary) hypertension: Secondary | ICD-10-CM

## 2022-04-07 MED ORDER — FUROSEMIDE 20 MG PO TABS: 30.0000 mg | ORAL_TABLET | Freq: Every day | ORAL | 3 refills | Status: DC

## 2022-04-07 MED ORDER — SPIRONOLACTONE 25 MG PO TABS: 12.5000 mg | ORAL_TABLET | Freq: Every day | ORAL | 3 refills | Status: DC

## 2022-04-07 NOTE — Patient Instructions (Addendum)
Medication Instructions:  INCREASE Lasix to 30 mg (1.5 tablets) daily DECREASE Spironolactone to 12.5 mg (half tablet) daily  *If you need a refill on your cardiac medications before your next appointment, please call your pharmacy*  Lab Work: Your physician recommends that you return for lab work in 3 weeks (04/28/22):  BMP Pro BNP  If you have labs (blood work) drawn today and your tests are completely normal, you will receive your results only by: MyChart Message (if you have MyChart) OR A paper copy in the mail If you have any lab test that is abnormal or we need to change your treatment, we will call you to review the results.  Testing/Procedures: Your physician has requested that you have an echocardiogram. Echocardiography is a painless test that uses sound waves to create images of your heart. It provides your doctor with information about the size and shape of your heart and how well your heart's chambers and valves are working. This procedure takes approximately one hour. There are no restrictions for this procedure. Please do NOT wear cologne, perfume, aftershave, or lotions (deodorant is allowed). Please arrive 15 minutes prior to your appointment time.  Please schedule for April 2024  ZIO XT- Long Term Monitor Instructions  Your physician has requested you wear a ZIO patch monitor for 3 days.  This is a single patch monitor. [employer] supplies one patch monitor per enrollment. Additional stickers are not available. Please do not apply patch if you will be having a Nuclear Stress Test,  Echocardiogram, Cardiac CT, MRI, or Chest Xray during the period you would be wearing the  monitor. The patch cannot be worn during these tests. You cannot remove and re-apply the  ZIO XT patch monitor.  Your ZIO patch monitor will be mailed 3 day USPS to your address on file. It may take 3-5 days  to receive your monitor after you have been enrolled.  Once you have received your monitor,  please review the enclosed instructions. Your monitor  has already been registered assigning a specific monitor serial # to you.  Billing and Patient Assistance Program Information  We have supplied [employer] with any of your insurance information on file for billing purposes. [employer] offers a sliding scale Patient Assistance Program for patients that do not have  insurance, or whose insurance does not completely cover the cost of the ZIO monitor.  You must apply for the Patient Assistance Program to qualify for this discounted rate.  To apply, please call [employer] at [phone], select option 4, select option 2, ask to apply for  Patient Assistance Program. Theodore Demark will ask your household income, and how many people  are in your household. They will quote your out-of-pocket cost based on that information.  [employer] will also be able to set up a 61-month interest-free payment plan if needed.  Applying the monitor   Shave hair from upper left chest.  Hold abrader disc by orange tab. Rub abrader in 40 strokes over the upper left chest as  indicated in your monitor instructions.  Clean area with 4 enclosed alcohol pads. Let dry.  Apply patch as indicated in monitor instructions. Patch will be placed under collarbone on left  side of chest with arrow pointing upward.  Rub patch adhesive wings for 2 minutes. Remove white label marked "1". Remove the white  label marked "2". Rub patch adhesive wings for 2 additional minutes.  While looking in a mirror, press and release button in center of patch. A small  green light will  flash 3-4 times. This will be your only indicator that the monitor has been turned on.  Do not shower for the first 24 hours. You may shower after the first 24 hours.  Press the button if you feel a symptom. You will hear a small click. Record Date, Time and  Symptom in the Patient Logbook.  When you are ready to remove the patch, follow instructions on the last 2 pages of  Patient  Logbook. Stick patch monitor onto the last page of Patient Logbook.  Place Patient Logbook in the blue and white box. Use locking tab on box and tape box closed  securely. The blue and white box has prepaid postage on it. Please place it in the mailbox as  soon as possible. Your physician should have your test results approximately 7 days after the  monitor has been mailed back to [facility].  Call [facility] at [phone] if you have questions regarding  your ZIO XT patch monitor. Call them immediately if you see an orange light blinking on your  monitor.  If your monitor falls off in less than 4 days, contact our Monitor department at [phone].  If your monitor becomes loose or falls off after 4 days call [employer] at [phone] for  suggestions on securing your monitor  Follow-Up: At [facility], you and your health needs are our priority.  As part of our continuing mission to provide you with exceptional heart care, we have created designated Provider Care Teams.  These Care Teams include your primary Cardiologist (physician) and Advanced Practice Providers (APPs -  Physician Assistants and Nurse Practitioners) who all work together to provide you with the care you need, when you need it.   Your next appointment:   4 month(s) after Echocardiogram   The format for your next appointment:   In Person  Provider:   Sanda Klein, MD     Other Instructions  Important Information About Sugar

## 2022-04-07 NOTE — Progress Notes (Signed)
Cardiology Office Note:    Date:  04/07/2022   ID:  Donald [name], DOB [birth year], MRN [id number]  PCP:  Prince Solian, MD   [facility] Providers Cardiologist:  Sanda Klein, MD     Referring MD: Prince Solian, MD   No chief complaint on file.    History of Present Illness:    Donald [name] is a 71 y.o. male with a hx of nonischemic cardiomyopathy dating back to at least 2013 when he had coronary angiography that showed no evidence of significant obstructive disease.  Additional medical problems include treated hypertension, type 2 diabetes mellitus, hypercholesterolemia, OSA on CPAP.    In 2013, nuclear stress that showed a left ventricular ejection fraction of 50%, with a normal perfusion pattern and mild left ventricular dilatation.  His echocardiogram also showed an EF of 50-55% with mild diffuse left ventricular hypokinesis and no significant valvular abnormalities.  In 2016, his pulmonary specialist Dr. Chase Caller ordered a cardiopulmonary excise stress test that showed mild-mod reduced functional capacity without any clear evidence of cardiovascular limitations.  Chronotropic response was normal.  The peak VO2 was mildly reduced at 23.5 mL/kilogram/minute (76% of predicted).  VE/VCO2 slope was normal.echocardiogram performed 03/22/2021 shows further reduction in LVEF which is now down to 35-40%.  Diastolic parameters suggest normal filling pressures.  The systolic PA pressure is normal at 29 mmHg. Once again there are no significant valvular abnormalities.  Despite transition to [facility] since maximum dose), metoprolol succinate and spironolactone LVEF has not really increased much, now 40% on echocardiogram performed April 2023.  He had allergic response to SGLT2 inhibitors and has a history of very frequent urinary tract infections due to ureteral obstruction.  Overall continues to have NYHA functional class II exertional dyspnea (he can climb a flight of stairs  without stopping to catch his breath, becomes short of breath walking across the street, roughly 100 feet.  He continues to exercise with a trainer at the gym.  He has not had problems with orthopnea, PND or significant lower extremity edema.    Reports compliance with CPAP.  Biggest complaint is fatigue.  Denies excessive daytime hypersomnolence, does not have to take naps every day.  Today his blood pressure is low at 90/64, but he is not symptomatic.  He weighs 196 pounds which is well below our previously established "dry weight" limit of 200 pounds.  At home, on his own scale he actually weighs as little as 186 pounds (although he has not been weighing recently).  He has fairly frequent episodes of orthostatic dizziness that resolved if he just stands still for a few seconds.  However he also had an episode of dizziness while driving his car, without a change in position.  This was brief, happened about a month ago and has not recurred.   From a metabolic point of view he is doing fairly well.  Most recent hemoglobin A1c was 7.4% in the September.  He does not have coronary artery disease.  Most recent LDL cholesterol is 116.  Most recent creatinine was 1.28.   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  He also has some early cognitive deficits especially short-term memory problems and is seeing Dr. Brett Fairy , who also monitors his CPAP for OSA.  He is taking memantine with some benefit.   Past Medical History:  Diagnosis Date   Allergic rhinitis, seasonal  Asthma    Bronchitis    Chronic kidney disease, stage I    collar bone    dislocation left side 05/2014   Degenerative joint disease of knee    bilateral   Diabetes mellitus    GERD (gastroesophageal reflux disease)    Heart murmur    history of   History of acute prostatitis    History of hematuria    Hyperlipidemia     Hypertension    IgA nephropathy    OSA on CPAP    Pneumonia    Sinusitis     Past Surgical History:  Procedure Laterality Date   BIOPSY  05/17/2020   Procedure: BIOPSY;  Surgeon: Clarene Essex, MD;  Location: [facility];  Service: Endoscopy;;   CARDIAC CATHETERIZATION  09/29/2011   normal   CERVICAL SPINE SURGERY     COLONOSCOPY WITH PROPOFOL N/A 05/17/2020   Procedure: COLONOSCOPY WITH PROPOFOL;  Surgeon: Clarene Essex, MD;  Location: [facility];  Service: Endoscopy;  Laterality: N/A;   deviated septum  1930s   ESOPHAGOGASTRODUODENOSCOPY (EGD) WITH PROPOFOL N/A 05/17/2020   Procedure: ESOPHAGOGASTRODUODENOSCOPY (EGD) WITH PROPOFOL;  Surgeon: Clarene Essex, MD;  Location: [facility];  Service: Endoscopy;  Laterality: N/A;   HEMOSTASIS CLIP PLACEMENT  05/17/2020   Procedure: HEMOSTASIS CLIP PLACEMENT;  Surgeon: Clarene Essex, MD;  Location: [facility];  Service: Endoscopy;;   HERNIA REPAIR     LEFT HEART CATHETERIZATION WITH CORONARY ANGIOGRAM N/A 09/29/2011   Procedure: LEFT HEART CATHETERIZATION WITH CORONARY ANGIOGRAM;  Surgeon: Sanda Klein, MD;  Location: [facility];  Service: Cardiovascular;  Laterality: N/A;   NM MYOVIEW LTD  07/08/2011   mild LV dilatation,mild septal hypokinesia   POLYPECTOMY  05/17/2020   Procedure: POLYPECTOMY;  Surgeon: Clarene Essex, MD;  Location: [facility];  Service: Endoscopy;;   TONSILLECTOMY     US ECHOCARDIOGRAPHY  09/07/2011   EF 35-45%,mod.ant hypokinesis,boderline LA & RA enlargement,trace MR,TR & PI    Current Medications: Current Meds  Medication Sig   albuterol (PROVENTIL HFA;VENTOLIN HFA) 108 (90 BASE) MCG/ACT inhaler Inhale 2 puffs into the lungs every 6 (six) hours as needed for wheezing or shortness of breath. For shortness of breath   Continuous Blood Gluc Sensor (FREESTYLE LIBRE 14 DAY SENSOR) MISC    Cyanocobalamin (VITAMIN B12) 500 MCG TABS Take 500 mcg by mouth daily.   DULoxetine (CYMBALTA) 30 MG capsule Take 90 mg by mouth daily.    finasteride (PROSCAR) 5 MG tablet Take 5 mg by mouth daily.   HYDROcodone-acetaminophen (NORCO/VICODIN) 5-325 MG tablet Take one tablet by mouth every 6 hours for severe pain Dx: R07.51   Insulin Aspart FlexPen (NOVOLOG) 100 UNIT/ML Inject 40-60 Units into the skin daily as needed.   insulin degludec (TRESIBA FLEXTOUCH) 200 UNIT/ML FlexTouch Pen Inject 90 Units into the skin daily in the afternoon.   Insulin Pen Needle (UNIFINE PENTIPS PLUS) 32G X 4 MM MISC USE WITH TRESIBA DAILY   loratadine (CLARITIN) 10 MG tablet Take 10 mg by mouth daily.   memantine (NAMENDA) 10 MG tablet Take 1 tablet (10 mg total) by mouth 2 (two) times daily.   metFORMIN (GLUCOPHAGE) 1000 MG tablet Take 1 tablet (1,000 mg total) by mouth 2 (two) times daily with a meal. Restart on 8/22   metoprolol succinate (TOPROL-XL) 50 MG 24 hr tablet Take 50 mg by mouth 2 (two) times daily. Take with or immediately following a meal.   Multiple Vitamins-Minerals (CENTRUM SILVER 50+MEN PO) Take 1 tablet by mouth  daily in the afternoon.   Omega-3 Fatty Acids (FISH OIL) 1000 MG CPDR Take 2,000 Units by mouth daily.   pantoprazole (PROTONIX) 40 MG tablet Take 1 tablet (40 mg total) by mouth 2 (two) times daily.   rosuvastatin (CRESTOR) 20 MG tablet Take 1 tablet (20 mg total) by mouth daily.   sacubitril-valsartan (ENTRESTO) 97-103 MG Take 1 tablet by mouth 2 (two) times daily.   silodosin (RAPAFLO) 8 MG CAPS capsule Take 8 mg by mouth daily.   [DISCONTINUED] furosemide (LASIX) 20 MG tablet Take 1 tablet (20 mg total) by mouth daily in the afternoon.   [DISCONTINUED] spironolactone (ALDACTONE) 25 MG tablet TAKE ONE TABLET BY MOUTH ONE TIME DAILY     Allergies:   Ciprofloxacin, Empagliflozin, Levofloxacin, Aricept [donepezil hcl], Bactrim [sulfamethoxazole-trimethoprim], Levofloxacin, and Misc. sulfonamide containing compounds   Social History   Socioeconomic History   Marital status: Married    Spouse name: Diane   Number of  children: 1   Years of education: 14   Highest education level: Not on file  Occupational History   Occupation: Herbalist: Interior and spatial designer  Tobacco Use   Smoking status: Never   Smokeless tobacco: Never   Tobacco comments:    Second hand smoke for 26 years per pt  Vaping Use   Vaping Use: Never used  Substance and Sexual Activity   Alcohol use: No    Alcohol/week: 0.0 standard drinks of alcohol   Drug use: No   Sexual activity: Not on file  Other Topics Concern   Not on file  Social History Narrative   Patient is married (Diane) and lives at home with his wife.   Patient has one child.   Patient is working full-time.   Patient has a Geophysicist/field seismologist.   Patient is left-handed.   Patient drinks one glass of tea daily, one soda daily.   Social Determinants of Health   Financial Resource Strain: Not on file  Food Insecurity: Not on file  Transportation Needs: Not on file  Physical Activity: Not on file  Stress: Not on file  Social Connections: Not on file     Family History: The patient's family history includes Coronary artery disease in his father; Diabetes type II in his father; Heart attack in his father.  ROS:   Please see the history of present illness.     All other systems reviewed and are negative.  EKGs/Labs/Other Studies Reviewed:    The following studies were reviewed today: Notes/labs/imaging studies from ED visit 03/27/2021  Echocardiogram 03/22/2021  EKG:  EKG is ordered today shows normal sinus rhythm, normal tracing.  QTc 464 ms. Recent Labs: 05/06/2021: BNP 6.6; Magnesium 1.8 07/06/2021: BUN 45; Creatinine, Ser 1.28; Potassium 4.7; Sodium 138  Recent Lipid Panel    Component Value Date/Time   CHOL 233 (H) 05/13/2021 0949   TRIG 386 (H) 05/13/2021 0949   HDL 40 05/13/2021 0949   CHOLHDL 5.8 (H) 05/13/2021 0949   LDLCALC 124 (H) 05/13/2021 0949     Risk Assessment/Calculations:           Physical Exam:    VS:  BP  90/64 (BP Location: Left Arm, Patient Position: Sitting, Cuff Size: Normal)   Pulse 88   Ht '5\' 8"'$  (1.727 m)   Wt 88.9 kg   SpO2 93%   BMI 29.80 kg/m     Wt Readings from Last 3 Encounters:  04/07/22 88.9 kg  02/21/22 90.3 kg  12/28/21 90  kg      General: Alert, oriented x3, no distress, overweight/borderline obese Head: no evidence of trauma, PERRL, EOMI, no exophtalmos or lid lag, no myxedema, no xanthelasma; normal ears, nose and oropharynx Neck: normal jugular venous pulsations, but he has very prominent hepatojugular reflux; brisk carotid pulses without delay and no carotid bruits Chest: clear to auscultation, no signs of consolidation by percussion or palpation, normal fremitus, symmetrical and full respiratory excursions Cardiovascular: normal position and quality of the apical impulse, regular rhythm, normal first and second heart sounds, no murmurs, rubs or gallops Abdomen: no tenderness or distention, no masses by palpation, no abnormal pulsatility or arterial bruits, normal bowel sounds, no hepatosplenomegaly Extremities: no clubbing, cyanosis or edema; 2+ radial, ulnar and brachial pulses bilaterally; 2+ right femoral, posterior tibial and dorsalis pedis pulses; 2+ left femoral, posterior tibial and dorsalis pedis pulses; no subclavian or femoral bruits Neurological: grossly nonfocal Psych: Normal mood and affect    ASSESSMENT:    1. Chronic combined systolic and diastolic heart failure (Violet)   2. Near syncope   3. Coronary artery disease involving native coronary artery of native heart without angina pectoris   4. Type 2 diabetes mellitus without complication, with long-term current use of insulin (Gisela)   5. Essential hypertension   6. Mixed hyperlipidemia      PLAN:    In order of problems listed above:  CHF: He appears to have slowly progressive nonischemic dilated cardiomyopathy.  Coronary CT angiography again shows scattered plaque (greater than average for  his age), but no significant stenoses.  He does not have angina.  He is now on maximum medical therapy (maximum dose Entresto, good dose of beta-blocker and spironolactone, not using SGLT2 inhibitors due to side effects).  LV function remains mild-mildly depressed with an EF of 40%.  He is on maximum dose of Entresto and his blood pressure limits escalation of his beta-blocker.  In fact he has some symptoms of orthostatic hypotension and I recommend reducing the dose of spironolactone to 12.5 mg daily.  Has some findings to suggest mild hypervolemia and will try to gently and His dose of loop diuretic.  Reinforced importance of sodium restriction and daily weight monitoring.   It is possible that he is lost some weight and a previous estimate of his "dry weight "is no longer accurate.  Check proBNP.  Reassess LVEF at a 1 year interval. Dizziness/near syncope: This appears to be largely orthostatic hypotension but he had at least 1 episode that occurred without changes in position while driving his car.  Due to his low EF have some concern for malignant ventricular arrhythmias.  Will have him wear a arrhythmia monitor. CAD: Extensive atherosclerotic plaque without meaningful stenoses.  Focus is on risk factor modification.  Ideally would like his LDL cholesterol less than 70. DM: Fair control with hemoglobin A1c 7.4%.Marland Kitchen HTN: Blood pressure is low today.  Reduce spironolactone to 12.5 mg once daily. HLP: Started on rosuvastatin.  Target LDL less than 70. OSA: Compliant with CPAP.  Denies daytime hypersomnolence.  Monitored by Dr. Brett Fairy. Cognitive deficits: Also followed by Dr. Brett Fairy in the neurology clinic.  Note absence of specific abnormalities on PET scan of the brain and MRI of the brain (although he does have some mild chronic microvascular ischemic changes in the hemispheres and pons).        Medication Adjustments/Labs and Tests Ordered: Current medicines are reviewed at length with the  patient today.  Concerns regarding medicines are outlined  above.  Orders Placed This Encounter  Procedures   Pro b natriuretic peptide (BNP)   Basic metabolic panel   LONG TERM MONITOR (3-14 DAYS)   EKG 12-Lead   ECHOCARDIOGRAM COMPLETE   Meds ordered this encounter  Medications   furosemide (LASIX) 20 MG tablet    Sig: Take 1.5 tablets (30 mg total) by mouth daily in the afternoon.    Dispense:  135 tablet    Refill:  3    Dose change new Rx   spironolactone (ALDACTONE) 25 MG tablet    Sig: Take 0.5 tablets (12.5 mg total) by mouth daily.    Dispense:  45 tablet    Refill:  3    Dose change new Rx    Patient Instructions  Medication Instructions:  INCREASE Lasix to 30 mg (1.5 tablets) daily DECREASE Spironolactone to 12.5 mg (half tablet) daily  *If you need a refill on your cardiac medications before your next appointment, please call your pharmacy*  Lab Work: Your physician recommends that you return for lab work in 3 weeks (04/28/22):  BMP Pro BNP  If you have labs (blood work) drawn today and your tests are completely normal, you will receive your results only by: MyChart Message (if you have MyChart) OR A paper copy in the mail If you have any lab test that is abnormal or we need to change your treatment, we will call you to review the results.  Testing/Procedures: Your physician has requested that you have an echocardiogram. Echocardiography is a painless test that uses sound waves to create images of your heart. It provides your doctor with information about the size and shape of your heart and how well your heart's chambers and valves are working. This procedure takes approximately one hour. There are no restrictions for this procedure. Please do NOT wear cologne, perfume, aftershave, or lotions (deodorant is allowed). Please arrive 15 minutes prior to your appointment time.  Please schedule for April 2024  ZIO XT- Long Term Monitor Instructions  Your  physician has requested you wear a ZIO patch monitor for 3 days.  This is a single patch monitor. [employer] supplies one patch monitor per enrollment. Additional stickers are not available. Please do not apply patch if you will be having a Nuclear Stress Test,  Echocardiogram, Cardiac CT, MRI, or Chest Xray during the period you would be wearing the  monitor. The patch cannot be worn during these tests. You cannot remove and re-apply the  ZIO XT patch monitor.  Your ZIO patch monitor will be mailed 3 day USPS to your address on file. It may take 3-5 days  to receive your monitor after you have been enrolled.  Once you have received your monitor, please review the enclosed instructions. Your monitor  has already been registered assigning a specific monitor serial # to you.  Billing and Patient Assistance Program Information  We have supplied [employer] with any of your insurance information on file for billing purposes. [employer] offers a sliding scale Patient Assistance Program for patients that do not have  insurance, or whose insurance does not completely cover the cost of the ZIO monitor.  You must apply for the Patient Assistance Program to qualify for this discounted rate.  To apply, please call [employer] at [phone], select option 4, select option 2, ask to apply for  Patient Assistance Program. Theodore Demark will ask your household income, and how many people  are in your household. They will quote your out-of-pocket cost based on  that information.  [employer] will also be able to set up a 54-month interest-free payment plan if needed.  Applying the monitor   Shave hair from upper left chest.  Hold abrader disc by orange tab. Rub abrader in 40 strokes over the upper left chest as  indicated in your monitor instructions.  Clean area with 4 enclosed alcohol pads. Let dry.  Apply patch as indicated in monitor instructions. Patch will be placed under collarbone on left  side of chest with arrow  pointing upward.  Rub patch adhesive wings for 2 minutes. Remove white label marked "1". Remove the white  label marked "2". Rub patch adhesive wings for 2 additional minutes.  While looking in a mirror, press and release button in center of patch. A small green light will  flash 3-4 times. This will be your only indicator that the monitor has been turned on.  Do not shower for the first 24 hours. You may shower after the first 24 hours.  Press the button if you feel a symptom. You will hear a small click. Record Date, Time and  Symptom in the Patient Logbook.  When you are ready to remove the patch, follow instructions on the last 2 pages of Patient  Logbook. Stick patch monitor onto the last page of Patient Logbook.  Place Patient Logbook in the blue and white box. Use locking tab on box and tape box closed  securely. The blue and white box has prepaid postage on it. Please place it in the mailbox as  soon as possible. Your physician should have your test results approximately 7 days after the  monitor has been [facility]  Call INip[place]eat [phone] you have questions regarding  your ZIO XT patch monitor. Call them immediately if you see an orange light blinking on your  monitor.  If your monitor falls off in less than 4 days, contact our Monitor department at [phone]  If your monitor becomes loose or falls off after 4 days call [employer] at [phone]  suggestions on securing your monitor  Follow-Up: [facility] and your [facility] needs are our priority.  As part of our continuing mission to provide you with exceptional heart care, we have created designated Provider Care Teams.  These Care Teams include your primary Cardiologist (physician) and Advanced Practice Providers (APPs -  Physician Assistants and Nurse Practitioners) who all work together to provide you with the care you need, when you need it.   Your next  appointment:   4 month(s) after Echocardiogram   The format for your next appointment:   In Person  Provider:   MSanda Klein MD     Other Instructions  Important Information About Sugar         Signed, MSanda Klein MD  04/07/2022 6:44 PM    [place]

## 2022-04-10 ENCOUNTER — Ambulatory Visit: Payer: HMO | Attending: Cardiovascular Disease

## 2022-04-10 DIAGNOSIS — R55 Syncope and collapse: Secondary | ICD-10-CM

## 2022-04-10 NOTE — Progress Notes (Unsigned)
Enrolled for [employer] to mail a ZIO XT long term holter monitor to the patients address on file.  

## 2022-04-25 DIAGNOSIS — R55 Syncope and collapse: Secondary | ICD-10-CM

## 2022-04-26 ENCOUNTER — Telehealth: Payer: Self-pay | Admitting: *Deleted

## 2022-04-26 NOTE — Telephone Encounter (Signed)
Called and spoke with pt wife. Advised we received fax from [facility] oxygen with medical questions to try and get him approved for oxygen. Advised per last titration study, showed oxygen not needed. Recommended he follow up with pulmonologist at this point to request blood gas study. This was part of the questionnaire on form to qualify for oxygen. Wife verbalized understanding and will have him f/u with pulmonology. Marland Kitchen

## 2022-04-29 LAB — BASIC METABOLIC PANEL
BUN/Creatinine Ratio: 43 — ABNORMAL HIGH (ref 10–24)
BUN: 49 mg/dL — ABNORMAL HIGH (ref 8–27)
CO2: 24 mmol/L (ref 20–29)
Calcium: 10 mg/dL (ref 8.6–10.2)
Chloride: 100 mmol/L (ref 96–106)
Creatinine, Ser: 1.15 mg/dL (ref 0.76–1.27)
Glucose: 64 mg/dL — ABNORMAL LOW (ref 70–99)
Potassium: 3.9 mmol/L (ref 3.5–5.2)
Sodium: 141 mmol/L (ref 134–144)
eGFR: 68 mL/min/{1.73_m2} (ref >59)

## 2022-04-29 LAB — PRO B NATRIURETIC PEPTIDE: NT-Pro BNP: <36 pg/mL (ref 0–376)

## 2022-05-04 ENCOUNTER — Other Ambulatory Visit: Payer: Self-pay

## 2022-05-04 MED ORDER — FUROSEMIDE 20 MG PO TABS: 20.0000 mg | ORAL_TABLET | ORAL | 3 refills | Status: DC

## 2022-05-06 ENCOUNTER — Other Ambulatory Visit: Payer: Self-pay | Admitting: Cardiovascular Disease

## 2022-05-18 DIAGNOSIS — E785 Hyperlipidemia, unspecified: Secondary | ICD-10-CM | POA: Diagnosis not present

## 2022-05-18 DIAGNOSIS — E118 Type 2 diabetes mellitus with unspecified complications: Secondary | ICD-10-CM | POA: Diagnosis not present

## 2022-05-18 DIAGNOSIS — N182 Chronic kidney disease, stage 2 (mild): Secondary | ICD-10-CM | POA: Diagnosis not present

## 2022-05-18 DIAGNOSIS — I129 Hypertensive chronic kidney disease with stage 1 through stage 4 chronic kidney disease, or unspecified chronic kidney disease: Secondary | ICD-10-CM | POA: Diagnosis not present

## 2022-05-18 DIAGNOSIS — Z794 Long term (current) use of insulin: Secondary | ICD-10-CM | POA: Diagnosis not present

## 2022-05-18 DIAGNOSIS — I5042 Chronic combined systolic (congestive) and diastolic (congestive) heart failure: Secondary | ICD-10-CM | POA: Diagnosis not present

## 2022-05-22 DIAGNOSIS — G4733 Obstructive sleep apnea (adult) (pediatric): Secondary | ICD-10-CM | POA: Diagnosis not present

## 2022-06-04 DIAGNOSIS — G4733 Obstructive sleep apnea (adult) (pediatric): Secondary | ICD-10-CM | POA: Diagnosis not present

## 2022-06-08 ENCOUNTER — Telehealth: Payer: Self-pay | Admitting: Cardiovascular Disease

## 2022-06-08 DIAGNOSIS — I429 Cardiomyopathy, unspecified: Secondary | ICD-10-CM

## 2022-06-08 MED ORDER — ENTRESTO 97-103 MG PO TABS: 1.0000 | ORAL_TABLET | Freq: Two times a day (BID) | ORAL | 1 refills | Status: DC

## 2022-06-08 NOTE — Addendum Note (Signed)
Addended by: Deanna Artis A on: 06/08/2022 09:05 AM   Modules accepted: Orders

## 2022-06-08 NOTE — Telephone Encounter (Signed)
*  STAT* If patient is at the pharmacy, call can be transferred to refill team.   1. Which medications need to be refilled? (please list name of each medication and dose if known)   sacubitril-valsartan (ENTRESTO) 97-103 MG    2. Which pharmacy/location (including street and city if local pharmacy) is medication to be sent to?  [facility] # [place], [place]    3. Do they need a 30 day or 90 day supply? 90 day

## 2022-07-03 ENCOUNTER — Telehealth: Payer: Self-pay | Admitting: Emergency Medicine

## 2022-07-03 NOTE — Telephone Encounter (Signed)
Fax Sent to [employer] Patient Assistance for [employer]- Landscape architect

## 2022-07-05 ENCOUNTER — Other Ambulatory Visit: Payer: Self-pay | Admitting: Internal Medicine

## 2022-07-05 ENCOUNTER — Ambulatory Visit
Admission: RE | Admit: 2022-07-05 | Discharge: 2022-07-05 | Disposition: A | Discharge status: Home / Self Care | Payer: HMO | Source: Ambulatory Visit | Attending: Internal Medicine | Admitting: Internal Medicine

## 2022-07-05 DIAGNOSIS — M545 Low back pain, unspecified: Secondary | ICD-10-CM | POA: Diagnosis not present

## 2022-07-05 DIAGNOSIS — M48061 Spinal stenosis, lumbar region without neurogenic claudication: Secondary | ICD-10-CM | POA: Diagnosis not present

## 2022-07-05 DIAGNOSIS — G4733 Obstructive sleep apnea (adult) (pediatric): Secondary | ICD-10-CM | POA: Diagnosis not present

## 2022-07-12 ENCOUNTER — Telehealth: Payer: Self-pay | Admitting: Emergency Medicine

## 2022-07-12 NOTE — Telephone Encounter (Signed)
Spoke with patient's wife- Shauna Hugh- about patient assistance paperwork. Informed that we have the required Prescriber information filled out, just need the first 2 pages of most recent Tax Return and Pt's signature and date on patient authorization.  She said that she has these forms ready and will bring them into the office tomorrow. I will fax this to [employer] after all items received.

## 2022-07-13 ENCOUNTER — Telehealth: Payer: Self-pay | Admitting: Emergency Medicine

## 2022-07-13 DIAGNOSIS — M47816 Spondylosis without myelopathy or radiculopathy, lumbar region: Secondary | ICD-10-CM | POA: Diagnosis not present

## 2022-07-13 DIAGNOSIS — Z6828 Body mass index (BMI) 28.0-28.9, adult: Secondary | ICD-10-CM | POA: Diagnosis not present

## 2022-07-13 NOTE — Telephone Encounter (Signed)
Faxed in missing information to [employer] Patient [place]

## 2022-07-18 DIAGNOSIS — M47816 Spondylosis without myelopathy or radiculopathy, lumbar region: Secondary | ICD-10-CM | POA: Diagnosis not present

## 2022-07-19 DIAGNOSIS — M2560 Stiffness of unspecified joint, not elsewhere classified: Secondary | ICD-10-CM | POA: Diagnosis not present

## 2022-07-19 DIAGNOSIS — M6281 Muscle weakness (generalized): Secondary | ICD-10-CM | POA: Diagnosis not present

## 2022-07-19 DIAGNOSIS — M545 Low back pain, unspecified: Secondary | ICD-10-CM | POA: Diagnosis not present

## 2022-07-21 DIAGNOSIS — M2560 Stiffness of unspecified joint, not elsewhere classified: Secondary | ICD-10-CM | POA: Diagnosis not present

## 2022-07-21 DIAGNOSIS — M6281 Muscle weakness (generalized): Secondary | ICD-10-CM | POA: Diagnosis not present

## 2022-07-21 DIAGNOSIS — M545 Low back pain, unspecified: Secondary | ICD-10-CM | POA: Diagnosis not present

## 2022-07-24 DIAGNOSIS — M545 Low back pain, unspecified: Secondary | ICD-10-CM | POA: Diagnosis not present

## 2022-07-24 DIAGNOSIS — M2560 Stiffness of unspecified joint, not elsewhere classified: Secondary | ICD-10-CM | POA: Diagnosis not present

## 2022-07-24 DIAGNOSIS — M6281 Muscle weakness (generalized): Secondary | ICD-10-CM | POA: Diagnosis not present

## 2022-07-26 ENCOUNTER — Ambulatory Visit: Payer: HMO | Admitting: Family Medicine

## 2022-07-27 DIAGNOSIS — M47816 Spondylosis without myelopathy or radiculopathy, lumbar region: Secondary | ICD-10-CM | POA: Diagnosis not present

## 2022-07-28 DIAGNOSIS — M2560 Stiffness of unspecified joint, not elsewhere classified: Secondary | ICD-10-CM | POA: Diagnosis not present

## 2022-07-28 DIAGNOSIS — M545 Low back pain, unspecified: Secondary | ICD-10-CM | POA: Diagnosis not present

## 2022-07-28 DIAGNOSIS — M6281 Muscle weakness (generalized): Secondary | ICD-10-CM | POA: Diagnosis not present

## 2022-07-31 DIAGNOSIS — M6281 Muscle weakness (generalized): Secondary | ICD-10-CM | POA: Diagnosis not present

## 2022-07-31 DIAGNOSIS — M2560 Stiffness of unspecified joint, not elsewhere classified: Secondary | ICD-10-CM | POA: Diagnosis not present

## 2022-07-31 DIAGNOSIS — M545 Low back pain, unspecified: Secondary | ICD-10-CM | POA: Diagnosis not present

## 2022-08-02 DIAGNOSIS — M2560 Stiffness of unspecified joint, not elsewhere classified: Secondary | ICD-10-CM | POA: Diagnosis not present

## 2022-08-02 DIAGNOSIS — M545 Low back pain, unspecified: Secondary | ICD-10-CM | POA: Diagnosis not present

## 2022-08-02 DIAGNOSIS — M6281 Muscle weakness (generalized): Secondary | ICD-10-CM | POA: Diagnosis not present

## 2022-08-03 DIAGNOSIS — G4733 Obstructive sleep apnea (adult) (pediatric): Secondary | ICD-10-CM | POA: Diagnosis not present

## 2022-08-07 DIAGNOSIS — M2560 Stiffness of unspecified joint, not elsewhere classified: Secondary | ICD-10-CM | POA: Diagnosis not present

## 2022-08-07 DIAGNOSIS — M5459 Other low back pain: Secondary | ICD-10-CM | POA: Diagnosis not present

## 2022-08-07 DIAGNOSIS — M6281 Muscle weakness (generalized): Secondary | ICD-10-CM | POA: Diagnosis not present

## 2022-08-09 ENCOUNTER — Ambulatory Visit (HOSPITAL_COMMUNITY): Payer: PPO | Attending: Cardiovascular Disease

## 2022-08-09 DIAGNOSIS — I5042 Chronic combined systolic (congestive) and diastolic (congestive) heart failure: Secondary | ICD-10-CM | POA: Insufficient documentation

## 2022-08-09 LAB — ECHOCARDIOGRAM COMPLETE
Area-P 1/2: 4.86 cm2
Est EF: 40
S' Lateral: 5 cm

## 2022-08-09 MED ORDER — PERFLUTREN LIPID MICROSPHERE
1.0000 mL | INTRAVENOUS | Status: AC | PRN
  Administered 2022-08-09: 1 mL via INTRAVENOUS

## 2022-08-11 DIAGNOSIS — M2560 Stiffness of unspecified joint, not elsewhere classified: Secondary | ICD-10-CM | POA: Diagnosis not present

## 2022-08-11 DIAGNOSIS — M6281 Muscle weakness (generalized): Secondary | ICD-10-CM | POA: Diagnosis not present

## 2022-08-11 DIAGNOSIS — M5459 Other low back pain: Secondary | ICD-10-CM | POA: Diagnosis not present

## 2022-08-14 DIAGNOSIS — M47816 Spondylosis without myelopathy or radiculopathy, lumbar region: Secondary | ICD-10-CM | POA: Diagnosis not present

## 2022-08-16 ENCOUNTER — Encounter: Payer: Self-pay | Admitting: Cardiovascular Disease

## 2022-08-16 ENCOUNTER — Telehealth: Payer: Self-pay | Admitting: Cardiovascular Disease

## 2022-08-16 DIAGNOSIS — E785 Hyperlipidemia, unspecified: Secondary | ICD-10-CM | POA: Diagnosis not present

## 2022-08-16 DIAGNOSIS — I5042 Chronic combined systolic (congestive) and diastolic (congestive) heart failure: Secondary | ICD-10-CM | POA: Diagnosis not present

## 2022-08-16 DIAGNOSIS — G8929 Other chronic pain: Secondary | ICD-10-CM | POA: Diagnosis not present

## 2022-08-16 DIAGNOSIS — Z6830 Body mass index (BMI) 30.0-30.9, adult: Secondary | ICD-10-CM | POA: Diagnosis not present

## 2022-08-16 DIAGNOSIS — I130 Hypertensive heart and chronic kidney disease with heart failure and stage 1 through stage 4 chronic kidney disease, or unspecified chronic kidney disease: Secondary | ICD-10-CM | POA: Diagnosis not present

## 2022-08-16 DIAGNOSIS — N182 Chronic kidney disease, stage 2 (mild): Secondary | ICD-10-CM | POA: Diagnosis not present

## 2022-08-16 DIAGNOSIS — R413 Other amnesia: Secondary | ICD-10-CM | POA: Diagnosis not present

## 2022-08-16 DIAGNOSIS — E1122 Type 2 diabetes mellitus with diabetic chronic kidney disease: Secondary | ICD-10-CM | POA: Diagnosis not present

## 2022-08-16 DIAGNOSIS — R0902 Hypoxemia: Secondary | ICD-10-CM | POA: Diagnosis not present

## 2022-08-16 DIAGNOSIS — F39 Unspecified mood [affective] disorder: Secondary | ICD-10-CM | POA: Diagnosis not present

## 2022-08-16 DIAGNOSIS — G4733 Obstructive sleep apnea (adult) (pediatric): Secondary | ICD-10-CM | POA: Diagnosis not present

## 2022-08-16 NOTE — Telephone Encounter (Signed)
Called pt's wife to relay Dr. Renaye Rakers message. She was thankful for the call back.

## 2022-08-16 NOTE — Telephone Encounter (Signed)
I was not planning on checking any labs on him.  Glad to let PCP do all the labs.

## 2022-08-16 NOTE — Telephone Encounter (Signed)
Wife wants to check if patient will need lab work prior to his upcoming visit.  Wife noted patient's PCP wants to get cholesterol check and wants to combine lab tests.

## 2022-08-21 ENCOUNTER — Encounter: Payer: Self-pay | Admitting: Cardiovascular Disease

## 2022-08-21 ENCOUNTER — Ambulatory Visit: Payer: PPO | Attending: Cardiovascular Disease | Admitting: Cardiovascular Disease

## 2022-08-21 VITALS — BP 124/68 | HR 90 | Ht 68.0 in | Wt 195.0 lb

## 2022-08-21 DIAGNOSIS — I5042 Chronic combined systolic (congestive) and diastolic (congestive) heart failure: Secondary | ICD-10-CM

## 2022-08-21 DIAGNOSIS — G4733 Obstructive sleep apnea (adult) (pediatric): Secondary | ICD-10-CM | POA: Diagnosis not present

## 2022-08-21 DIAGNOSIS — E782 Mixed hyperlipidemia: Secondary | ICD-10-CM

## 2022-08-21 DIAGNOSIS — I2510 Atherosclerotic heart disease of native coronary artery without angina pectoris: Secondary | ICD-10-CM | POA: Diagnosis not present

## 2022-08-21 DIAGNOSIS — G3184 Mild cognitive impairment, so stated: Secondary | ICD-10-CM

## 2022-08-21 DIAGNOSIS — I10 Essential (primary) hypertension: Secondary | ICD-10-CM | POA: Diagnosis not present

## 2022-08-21 DIAGNOSIS — E119 Type 2 diabetes mellitus without complications: Secondary | ICD-10-CM

## 2022-08-21 DIAGNOSIS — Z794 Long term (current) use of insulin: Secondary | ICD-10-CM

## 2022-08-21 MED ORDER — FUROSEMIDE 20 MG PO TABS: 20.0000 mg | ORAL_TABLET | ORAL | 0 refills | Status: DC | PRN

## 2022-08-21 NOTE — Patient Instructions (Signed)
Medication Instructions:  Take 20mg  of Lasix once a day- ONLY IF WEIGHT OVER 200 POUNDS *If you need a refill on your cardiac medications before your next appointment, please call your pharmacy*  Follow-Up: At [facility], you and your health needs are our priority.  As part of our continuing mission to provide you with exceptional heart care, we have created designated Provider Care Teams.  These Care Teams include your primary Cardiologist (physician) and Advanced Practice Providers (APPs -  Physician Assistants and Nurse Practitioners) who all work together to provide you with the care you need, when you need it.  We recommend signing up for the patient portal called "MyChart".  Sign up information is provided on this After Visit Summary.  MyChart is used to connect with patients for Virtual Visits (Telemedicine).  Patients are able to view lab/test results, encounter notes, upcoming appointments, etc.  Non-urgent messages can be sent to your provider as well.   To learn more about what you can do with MyChart, go to [email or web address].    Your next appointment:   1 year(s)  Provider:   Thurmon Fair, MD

## 2022-08-21 NOTE — Progress Notes (Signed)
Cardiology Office Note:    Date:  08/27/2022   ID:  Donald [name], DOB 1950-05-27, MRN [id number]  PCP:  Chilton Greathouse, MD   [facility] Providers Cardiologist:  Thurmon Fair, MD     Referring MD: Chilton Greathouse, MD   Chief Complaint  Patient presents with   Congestive Heart Failure     History of Present Illness:    Donald [name] is a 72 y.o. male with a hx of nonischemic cardiomyopathy dating back to at least 2013 when he had coronary angiography that showed no evidence of significant obstructive disease.  Additional medical problems include treated hypertension, type 2 diabetes mellitus, hypercholesterolemia, OSA on CPAP.    Currently struggling most with neck pain.  Had radiofrequency ablation of the lumbar spine about a week ago and hopefully this will help.  Does not have any current cardiovascular complaints but has not been able to be active.  The patient specifically denies any chest pain at rest or with light exertion, dyspnea at rest or with exertion, orthopnea, paroxysmal nocturnal dyspnea, syncope, palpitations, focal neurological deficits, intermittent claudication, lower extremity edema, unexplained weight gain, cough, hemoptysis or wheezing.   Dizziness has improved after cutting back on the dose of spironolactone.  His proBNP in December was very low.  Dry weight seems to be probably 195-200 pounds.  In 2013, nuclear stress that showed a left ventricular ejection fraction of 50%, with a normal perfusion pattern and mild left ventricular dilatation.  His echocardiogram also showed an EF of 50-55% with mild diffuse left ventricular hypokinesis and no significant valvular abnormalities.  In 2016, his pulmonary specialist Dr. Marchelle Gearing ordered a cardiopulmonary excise stress test that showed mild-mod reduced functional capacity without any clear evidence of cardiovascular limitations.  Chronotropic response was normal.  The peak VO2 was mildly reduced at  23.5 mL/kilogram/minute (76% of predicted).  VE/VCO2 slope was normal.echocardiogram performed 03/22/2021 shows further reduction in LVEF which is now down to 35-40%.  Diastolic parameters suggest normal filling pressures.  The systolic PA pressure is normal at 29 mmHg. Once again there are no significant valvular abnormalities.  Despite transition to [facility] since maximum dose), metoprolol succinate and spironolactone LVEF has not really increased much, now 40% on echocardiogram performed April 2023.  He had allergic response to SGLT2 inhibitors and has a history of very frequent urinary tract infections due to ureteral obstruction.  Hemoglobin A1c has deteriorated, per his report most recently 8.5%, largely due to inability to be physically active.  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  He also has some early cognitive deficits especially short-term memory problems and is seeing Dr. Vickey Huger , who also monitors his CPAP for OSA.  He is taking memantine with some benefit.   Past Medical History:  Diagnosis Date   Allergic rhinitis, seasonal    Asthma    Bronchitis    Chronic kidney disease, stage I    collar bone    dislocation left side 05/2014   Degenerative joint disease of knee    bilateral   Diabetes mellitus    GERD (gastroesophageal reflux disease)    Heart murmur    history of   History of acute prostatitis    History of hematuria    Hyperlipidemia    Hypertension    IgA nephropathy    OSA on CPAP    Pneumonia    Sinusitis  Past Surgical History:  Procedure Laterality Date   BIOPSY  05/17/2020   Procedure: BIOPSY;  Surgeon: Vida Rigger, MD;  Location: [facility];  Service: Endoscopy;;   CARDIAC CATHETERIZATION  09/29/2011   normal   CERVICAL SPINE SURGERY     COLONOSCOPY WITH PROPOFOL N/A 05/17/2020   Procedure: COLONOSCOPY WITH PROPOFOL;  Surgeon: Vida Rigger,  MD;  Location: [facility];  Service: Endoscopy;  Laterality: N/A;   deviated septum  1930s   ESOPHAGOGASTRODUODENOSCOPY (EGD) WITH PROPOFOL N/A 05/17/2020   Procedure: ESOPHAGOGASTRODUODENOSCOPY (EGD) WITH PROPOFOL;  Surgeon: Vida Rigger, MD;  Location: [facility];  Service: Endoscopy;  Laterality: N/A;   HEMOSTASIS CLIP PLACEMENT  05/17/2020   Procedure: HEMOSTASIS CLIP PLACEMENT;  Surgeon: Vida Rigger, MD;  Location: [facility];  Service: Endoscopy;;   HERNIA REPAIR     LEFT HEART CATHETERIZATION WITH CORONARY ANGIOGRAM N/A 09/29/2011   Procedure: LEFT HEART CATHETERIZATION WITH CORONARY ANGIOGRAM;  Surgeon: Thurmon Fair, MD;  Location: [facility];  Service: Cardiovascular;  Laterality: N/A;   NM MYOVIEW LTD  07/08/2011   mild LV dilatation,mild septal hypokinesia   POLYPECTOMY  05/17/2020   Procedure: POLYPECTOMY;  Surgeon: Vida Rigger, MD;  Location: [facility];  Service: Endoscopy;;   TONSILLECTOMY     US ECHOCARDIOGRAPHY  09/07/2011   EF 35-45%,mod.ant hypokinesis,boderline LA & RA enlargement,trace MR,TR & PI    Current Medications: Current Meds  Medication Sig   Ascorbic Acid (VITAMIN C) 500 MG CHEW Chew 500 mg by mouth once.   Continuous Blood Gluc Sensor (FREESTYLE LIBRE 14 DAY SENSOR) MISC    DULoxetine (CYMBALTA) 30 MG capsule Take 90 mg by mouth daily.   finasteride (PROSCAR) 5 MG tablet Take 5 mg by mouth daily.   HYDROcodone-acetaminophen (NORCO/VICODIN) 5-325 MG tablet Take one tablet by mouth every 6 hours for severe pain Dx: R07.51   insulin degludec (TRESIBA FLEXTOUCH) 100 UNIT/ML FlexTouch Pen Inject 100 Units into the skin daily.   insulin lispro (HUMALOG KWIKPEN) 200 UNIT/ML KwikPen Inject 40-60 Units into the skin as needed.   Insulin Pen Needle (UNIFINE PENTIPS PLUS) 32G X 4 MM MISC USE WITH TRESIBA DAILY   metFORMIN (GLUCOPHAGE) 1000 MG tablet Take 1 tablet (1,000 mg total) by mouth 2 (two) times daily with a meal. Restart on 8/22   metoprolol succinate  (TOPROL-XL) 50 MG 24 hr tablet Take 50 mg by mouth 2 (two) times daily. Take with or immediately following a meal.   Multiple Vitamins-Minerals (CENTRUM SILVER 50+MEN PO) Take 1 tablet by mouth daily in the afternoon.   Omega-3 Fatty Acids (FISH OIL) 1000 MG CPDR Take 2,000 Units by mouth daily.   pantoprazole (PROTONIX) 40 MG tablet Take 1 tablet (40 mg total) by mouth 2 (two) times daily.   rosuvastatin (CRESTOR) 20 MG tablet TAKE ONE TABLET BY MOUTH ONE TIME DAILY   sacubitril-valsartan (ENTRESTO) 97-103 MG Take 1 tablet by mouth 2 (two) times daily.   silodosin (RAPAFLO) 8 MG CAPS capsule Take 8 mg by mouth daily.   spironolactone (ALDACTONE) 25 MG tablet Take 0.5 tablets (12.5 mg total) by mouth daily.   [DISCONTINUED] DULoxetine (CYMBALTA) 30 MG capsule Take 30 mg by mouth daily. (Patient not taking: Reported on 08/24/2022)   [DISCONTINUED] furosemide (LASIX) 20 MG tablet Take 1 tablet (20 mg total) by mouth every other day.   [DISCONTINUED] memantine (NAMENDA) 10 MG tablet Take 1 tablet (10 mg total) by mouth 2 (two) times daily.     Allergies:   Ciprofloxacin, Empagliflozin,  Levofloxacin, Aricept [donepezil hcl], Bactrim [sulfamethoxazole-trimethoprim], Levofloxacin, and Misc. sulfonamide containing compounds   Social History   Socioeconomic History   Marital status: Married    Spouse name: Diane   Number of children: 1   Years of education: 14   Highest education level: Not on file  Occupational History   Occupation: Teaching laboratory technician: Nurse, children's  Tobacco Use   Smoking status: Never   Smokeless tobacco: Never   Tobacco comments:    Second hand smoke for 26 years per pt  Vaping Use   Vaping Use: Never used  Substance and Sexual Activity   Alcohol use: No    Alcohol/week: 0.0 standard drinks of alcohol   Drug use: No   Sexual activity: Not on file  Other Topics Concern   Not on file  Social History Narrative   Patient is married (Diane) and lives at home  with his wife.   Patient has one child.   Patient is working full-time.   Patient has a Scientist, research (physical sciences).   Patient is left-handed.   Patient drinks one glass of tea daily, one soda daily.   Social Determinants of Health   Financial Resource Strain: Not on file  Food Insecurity: Not on file  Transportation Needs: Not on file  Physical Activity: Not on file  Stress: Not on file  Social Connections: Not on file     Family History: The patient's family history includes Coronary artery disease in his father; Diabetes type II in his father; Heart attack in his father.  ROS:   Please see the history of present illness.     All other systems reviewed and are negative.  EKGs/Labs/Other Studies Reviewed:    The following studies were reviewed today: Notes/labs/imaging studies from ED visit 03/27/2021  Echocardiogram 08/09/2022:   1. Left ventricular ejection fraction, by estimation, is 40%. The left  ventricle has moderately decreased function. The left ventricle  demonstrates global hypokinesis. The left ventricular internal cavity size  was mildly dilated. Left ventricular  diastolic parameters are consistent with Grade I diastolic dysfunction  (impaired relaxation). There is abnromal septal motion.   2. Right ventricular systolic function is normal. The right ventricular  size is mildly enlarged. Tricuspid regurgitation signal is inadequate for  assessing PA pressure.   3. The mitral valve is grossly normal. Trivial mitral valve  regurgitation. No evidence of mitral stenosis.   4. The aortic valve is tricuspid. There is mild thickening of the aortic  valve. Aortic valve regurgitation is not visualized. Aortic valve  sclerosis/calcification is present, without any evidence of aortic  stenosis.   5. The inferior vena cava is normal in size with greater than 50%  respiratory variability, suggesting right atrial pressure of 3 mmHg.   Comparison(s): No significant change from  prior study. Prior images  reviewed side by side.    Event monitor 05/09/2022:    Rhythm is normal sinus rhythm with normal circadian variation.   There are rare isolated premature atrial contractions and a single 4 beat run of nonsustained atrial tachycardia.  Atrial fibrillation was not seen.   There were rare premature ventricular complexes, very rare couplets and a brief period of ventricular try Gemini.  When tachycardia is not seen.   There is no significant bradycardia.   Symptom driven recordings show isolated premature atrial contractions or premature ventricular contractions.   Minimally abnormal event monitor.  Patient's symptoms are associated with isolated ectopic beats.  EKG:  EKG is  not ordered today; ECG from 04/07/2022 shows NSR, normal tracing.  QTc 464 ms. Recent Labs: 04/28/2022: BUN 49; Creatinine, Ser 1.15; NT-Pro BNP <36; Potassium 3.9; Sodium 141  Recent Lipid Panel    Component Value Date/Time   CHOL 233 (H) 05/13/2021 0949   TRIG 386 (H) 05/13/2021 0949   HDL 40 05/13/2021 0949   CHOLHDL 5.8 (H) 05/13/2021 0949   LDLCALC 124 (H) 05/13/2021 0949     Risk Assessment/Calculations:           Physical Exam:    VS:  BP 124/68 (BP Location: Left Arm, Patient Position: Sitting, Cuff Size: Normal)   Pulse 90   Ht 5\' 8"  (1.727 m)   Wt 195 lb (88.5 kg)   SpO2 95%   BMI 29.65 kg/m     Wt Readings from Last 3 Encounters:  08/24/22 195 lb 9.6 oz (88.7 kg)  08/21/22 195 lb (88.5 kg)  04/07/22 196 lb (88.9 kg)     General: Alert, oriented x3, no distress, overweight Head: no evidence of trauma, PERRL, EOMI, no exophtalmos or lid lag, no myxedema, no xanthelasma; normal ears, nose and oropharynx Neck: normal jugular venous pulsations and no hepatojugular reflux; brisk carotid pulses without delay and no carotid bruits Chest: clear to auscultation, no signs of consolidation by percussion or palpation, normal fremitus, symmetrical and full respiratory  excursions Cardiovascular: normal position and quality of the apical impulse, regular rhythm, normal first and second heart sounds, no murmurs, rubs or gallops Abdomen: no tenderness or distention, no masses by palpation, no abnormal pulsatility or arterial bruits, normal bowel sounds, no hepatosplenomegaly Extremities: no clubbing, cyanosis or edema; 2+ radial, ulnar and brachial pulses bilaterally; 2+ right femoral, posterior tibial and dorsalis pedis pulses; 2+ left femoral, posterior tibial and dorsalis pedis pulses; no subclavian or femoral bruits Neurological: grossly nonfocal Psych: Normal mood and affect    ASSESSMENT:    1. Chronic combined systolic and diastolic congestive heart failure   2. Coronary artery disease involving native coronary artery of native heart without angina pectoris   3. Type 2 diabetes mellitus without complication, with long-term current use of insulin   4. Essential hypertension   5. Mixed hyperlipidemia   6. OSA (obstructive sleep apnea)   7. MCI (mild cognitive impairment)       PLAN:    In order of problems listed above:  CHF: due to nonischemic dilated cardiomyopathy.  He is on maximum medical therapy (maximum dose Entresto, good dose of beta-blocker and spironolactone, not using SGLT2 inhibitors due to side effects).  LV function remains mild-mildly depressed with an EF of 40%.    Reinforced importance of sodium restriction and daily weight monitoring.    December proBNP was very low and his weight has not chnanged since then.   Dizziness/near syncope: This appears to be largely orthostatic hypotension, better after we reduced the spironolactone dose;  no arrhythmia on event monitor. CAD: Extensive atherosclerotic plaque without meaningful stenoses.  Focus is on risk factor modification.  Ideally would like his LDL cholesterol less than 70. DM: Fair control with hemoglobin A1c 7.4%. HTN: Blood pressure better after reducing spironolactone to 12.5  mg once daily. HLP: Started on rosuvastatin.  Target LDL less than 70. OSA: Compliant with CPAP (Monitored by Dr. Vickey Huger). Cognitive deficits: Unchanged. Also followed by Dr. Vickey Huger in the neurology clinic.  Note absence of specific abnormalities on PET scan of the brain and MRI of the brain (although he does have some mild chronic  microvascular ischemic changes in the hemispheres and pons).        Medication Adjustments/Labs and Tests Ordered: Current medicines are reviewed at length with the patient today.  Concerns regarding medicines are outlined above.  No orders of the defined types were placed in this encounter.  Meds ordered this encounter  Medications   furosemide (LASIX) 20 MG tablet    Sig: Take 1 tablet (20 mg total) by mouth as needed (Take 20mg  once a day for weight over 200lbs.).    Dispense:  30 tablet    Refill:  0    Dose change new Rx    Patient Instructions  Medication Instructions:  Take 20mg  of Lasix once a day- ONLY IF WEIGHT OVER 200 POUNDS *If you need a refill on your cardiac medications before your next appointment, please call your pharmacy*  Follow-Up: At [facility], you and your health needs are our priority.  As part of our continuing mission to provide you with exceptional heart care, we have created designated Provider Care Teams.  These Care Teams include your primary Cardiologist (physician) and Advanced Practice Providers (APPs -  Physician Assistants and Nurse Practitioners) who all work together to provide you with the care you need, when you need it.  We recommend signing up for the patient portal called "MyChart".  Sign up information is provided on this After Visit Summary.  MyChart is used to connect with patients for Virtual Visits (Telemedicine).  Patients are able to view lab/test results, encounter notes, upcoming appointments, etc.  Non-urgent messages can be sent to your provider as well.   To learn more about what you can  do with MyChart, go to [email or web address].    Your next appointment:   1 year(s)  Provider:   Thurmon Fair, MD        Signed, Thurmon Fair, MD  08/27/2022 3:41 PM    [facility]

## 2022-08-23 NOTE — Progress Notes (Unsigned)
PATIENT: Donald [name] DOB: 03-Jul-1950  REASON FOR VISIT: follow up HISTORY FROM: patient  No chief complaint on file.    HISTORY OF PRESENT ILLNESS:  08/23/22 ALL:  Donald [name] is a 72 y.o. male here today for follow up for OSA on CPAP with supplemental O2 and memory loss. He was last seen by Dr Vickey Huger 02/2022. He was advised to continue CPAP with 4L O2 and return in 6 months for MOCA. No previous MOCA on file to review. MMSE in 2022 28/30. He was started on memantine  BID in 10/2020. He seems to tolerate well. Donepezil started in 2021 but discontinued due to nausea. Neurocog eval with Dr Kieth Brightly 12/2021 showed mild reduction in global cognitive functioning. Mild worsening of processing speed but other cognitive domains stable. No clear neurodegenerative findings.     HISTORY: (copied from Dr Dohmeier's previous note)  Donald [name] is a 72 y.o. year old Caucasian male patient seen here in a RV on 02/21/2022.  The patient was tested in a home sleep test  in 2020, now retested by CPP titration in lab:  Study date was 25 December 2021 and the patient had a previous home sleep test that had shown an AHI of 37.9 with associated hypoxia and prescribed an auto CPAP titration device he was now invited for an in lab titration also to see if oxygen is in addition noted he slept in supine position for much of the night and apnea hypopnea index was 2.5 overall to my great surprise.  He was a started on 7 cmH2O CPAP switched to a full facemask during the titration and was given 1 L of oxygen the oxygen was later removed again as the CPAP pressure was escalated to 10 cm and oxygen sats remained  about 88%. Final pressure was 17 cm water.  We reset his CPAP to 5-16 cm water, 3 cm EPR and he was not given a script for oxygen. Donald [name] has used oxygen for almost 10 years now since he was diagnosed with hypoxemia in 2014.  He has relied on oxygen when he travels to be brought to his  destination, he uses an oxygen concentrator at home but of course that 1 does not allow him to travel with.  There were also several nights documented on the medical grade oxygen pulse oximetry and they show that the patient had between 63 and 40% of sleep at night in hypoxemia that means at 89 or below saturation.   Based on these data which can be variable from night to night I think that the patient should continue his oxygen at 4 L and that it should be bled into the CPAP with the new settings. His machine was currently 56-1/71 years old so it has a good 18 months to go.    REVIEW OF SYSTEMS: Out of a complete 14 system review of symptoms, the patient complains only of the following symptoms, and all other reviewed systems are negative.  ESS:  ALLERGIES: Allergies  Allergen Reactions   Ciprofloxacin     Tendons problem in shoulder Other reaction(s): Arthralgias (intolerance), Myalgias (intolerance)   Empagliflozin Anaphylaxis    Dehydration   Levofloxacin     Other reaction(s): Arthralgias (intolerance), Myalgias (intolerance)   Aricept [Donepezil Hcl] Nausea Only   Bactrim [Sulfamethoxazole-Trimethoprim] Hives   Levofloxacin Other (See Comments)    Muscle tighten up in left and right calf   Misc. Sulfonamide Containing Compounds Rash    HOME  MEDICATIONS: Outpatient Medications Prior to Visit  Medication Sig Dispense Refill   Ascorbic Acid (VITAMIN C) 500 MG CHEW Chew 500 mg by mouth once.     Continuous Blood Gluc Sensor (FREESTYLE LIBRE 14 DAY SENSOR) MISC      Cyanocobalamin (VITAMIN B12) 500 MCG TABS Take 500 mcg by mouth daily. (Patient not taking: Reported on 08/21/2022)     DULoxetine (CYMBALTA) 30 MG capsule Take 90 mg by mouth daily.     DULoxetine (CYMBALTA) 30 MG capsule Take 30 mg by mouth daily.     finasteride (PROSCAR) 5 MG tablet Take 5 mg by mouth daily.     furosemide (LASIX) 20 MG tablet Take 1 tablet (20 mg total) by mouth as needed (Take 20mg  once a day for  weight over 200lbs.). 30 tablet 0   HYDROcodone-acetaminophen (NORCO/VICODIN) 5-325 MG tablet Take one tablet by mouth every 6 hours for severe pain Dx: R07.51     Insulin Aspart FlexPen (NOVOLOG) 100 UNIT/ML Inject 40-60 Units into the skin daily as needed. (Patient not taking: Reported on 08/21/2022)     insulin degludec (TRESIBA FLEXTOUCH) 100 UNIT/ML FlexTouch Pen Inject 100 Units into the skin daily.     insulin degludec (TRESIBA FLEXTOUCH) 200 UNIT/ML FlexTouch Pen Inject 90 Units into the skin daily in the afternoon. (Patient not taking: Reported on 08/21/2022)     insulin lispro (HUMALOG KWIKPEN) 200 UNIT/ML KwikPen Inject 40-60 Units into the skin as needed.     Insulin Pen Needle (UNIFINE PENTIPS PLUS) 32G X 4 MM MISC USE WITH TRESIBA DAILY     loratadine (CLARITIN) 10 MG tablet Take 10 mg by mouth daily. (Patient not taking: Reported on 08/21/2022)     memantine (NAMENDA) 10 MG tablet Take 1 tablet (10 mg total) by mouth 2 (two) times daily. 180 tablet 3   metFORMIN (GLUCOPHAGE) 1000 MG tablet Take 1 tablet (1,000 mg total) by mouth 2 (two) times daily with a meal. Restart on 8/22     metoprolol succinate (TOPROL-XL) 50 MG 24 hr tablet Take 50 mg by mouth 2 (two) times daily. Take with or immediately following a meal.     Multiple Vitamins-Minerals (CENTRUM SILVER 50+MEN PO) Take 1 tablet by mouth daily in the afternoon.     Omega-3 Fatty Acids (FISH OIL) 1000 MG CPDR Take 2,000 Units by mouth daily.     pantoprazole (PROTONIX) 40 MG tablet Take 1 tablet (40 mg total) by mouth 2 (two) times daily. 180 tablet 0   rosuvastatin (CRESTOR) 20 MG tablet TAKE ONE TABLET BY MOUTH ONE TIME DAILY 90 tablet 3   sacubitril-valsartan (ENTRESTO) 97-103 MG Take 1 tablet by mouth 2 (two) times daily. 90 tablet 1   silodosin (RAPAFLO) 8 MG CAPS capsule Take 8 mg by mouth daily.     spironolactone (ALDACTONE) 25 MG tablet Take 0.5 tablets (12.5 mg total) by mouth daily. 45 tablet 3   No  facility-administered medications prior to visit.    PAST MEDICAL HISTORY: Past Medical History:  Diagnosis Date   Allergic rhinitis, seasonal    Asthma    Bronchitis    Chronic kidney disease, stage I    collar bone    dislocation left side 05/2014   Degenerative joint disease of knee    bilateral   Diabetes mellitus    GERD (gastroesophageal reflux disease)    Heart murmur    history of   History of acute prostatitis    History of hematuria  Hyperlipidemia    Hypertension    IgA nephropathy    OSA on CPAP    Pneumonia    Sinusitis     PAST SURGICAL HISTORY: Past Surgical History:  Procedure Laterality Date   BIOPSY  05/17/2020   Procedure: BIOPSY;  Surgeon: Vida Rigger, MD;  Location: [facility];  Service: Endoscopy;;   CARDIAC CATHETERIZATION  09/29/2011   normal   CERVICAL SPINE SURGERY     COLONOSCOPY WITH PROPOFOL N/A 05/17/2020   Procedure: COLONOSCOPY WITH PROPOFOL;  Surgeon: Vida Rigger, MD;  Location: [facility];  Service: Endoscopy;  Laterality: N/A;   deviated septum  1930s   ESOPHAGOGASTRODUODENOSCOPY (EGD) WITH PROPOFOL N/A 05/17/2020   Procedure: ESOPHAGOGASTRODUODENOSCOPY (EGD) WITH PROPOFOL;  Surgeon: Vida Rigger, MD;  Location: [facility];  Service: Endoscopy;  Laterality: N/A;   HEMOSTASIS CLIP PLACEMENT  05/17/2020   Procedure: HEMOSTASIS CLIP PLACEMENT;  Surgeon: Vida Rigger, MD;  Location: [facility];  Service: Endoscopy;;   HERNIA REPAIR     LEFT HEART CATHETERIZATION WITH CORONARY ANGIOGRAM N/A 09/29/2011   Procedure: LEFT HEART CATHETERIZATION WITH CORONARY ANGIOGRAM;  Surgeon: Thurmon Fair, MD;  Location: [facility];  Service: Cardiovascular;  Laterality: N/A;   NM MYOVIEW LTD  07/08/2011   mild LV dilatation,mild septal hypokinesia   POLYPECTOMY  05/17/2020   Procedure: POLYPECTOMY;  Surgeon: Vida Rigger, MD;  Location: [facility];  Service: Endoscopy;;   TONSILLECTOMY     US ECHOCARDIOGRAPHY  09/07/2011   EF 35-45%,mod.ant  hypokinesis,boderline LA & RA enlargement,trace MR,TR & PI    FAMILY HISTORY: Family History  Problem Relation Age of Onset   Coronary artery disease Father    Diabetes type II Father    Heart attack Father     SOCIAL HISTORY: Social History   Socioeconomic History   Marital status: Married    Spouse name: Diane   Number of children: 1   Years of education: 14   Highest education level: Not on file  Occupational History   Occupation: Teaching laboratory technician: Nurse, children[employer]  Tobacco Use   Smoking status: Never   Smokeless tobacco: Never   Tobacco comments:    Second hand smoke for 26 years per pt  Vaping Use   Vaping Use: Never used  Substance and Sexual Activity   Alcohol use: No    Alcohol/week: 0.0 standard drinks of alcohol   Drug use: No   Sexual activity: Not on file  Other Topics Concern   Not on file  Social History Narrative   Patient is married (Diane) and lives at home with his wife.   Patient has one child.   Patient is working full-time.   Patient has a Scientist, research (physical sciences).   Patient is left-handed.   Patient drinks one glass of tea daily, one soda daily.   Social Determinants of Health   Financial Resource Strain: Not on file  Food Insecurity: Not on file  Transportation Needs: Not on file  Physical Activity: Not on file  Stress: Not on file  Social Connections: Not on file  Intimate Partner Violence: Not on file     PHYSICAL EXAM  There were no vitals filed for this visit. There is no height or weight on file to calculate BMI.  Generalized: Well developed, in no acute distress  Cardiology: normal rate and rhythm, no murmur noted Respiratory: clear to auscultation bilaterally  Neurological examination  Mentation: Alert oriented to time, place, history taking. Follows all commands speech and language fluent  Cranial nerve II-XII: Pupils were equal round reactive to light. Extraocular movements were full, visual field were full  Motor:  The motor testing reveals 5 over 5 strength of all 4 extremities. Good symmetric motor tone is noted throughout.  Gait and station: Gait is normal.    DIAGNOSTIC DATA (LABS, IMAGING, TESTING) - I reviewed patient records, labs, notes, testing and imaging myself where available.     10/18/2020    9:18 AM 04/15/2020    9:42 AM  MMSE - Mini Mental State Exam  Orientation to time 4 5  Orientation to Place 5 5  Registration 3 3  Attention/ Calculation 5 5  Recall 2 3  Language- name 2 objects 2 2  Language- repeat 1 1  Language- follow 3 step command 3 3  Language- read & follow direction 1   Write a sentence 1   Copy design 1   Total score 28      Lab Results  Component Value Date   WBC 10.5 03/27/2021   HGB 13.1 03/27/2021   HCT 42.4 03/27/2021   MCV 80.3 03/27/2021   PLT 290 03/27/2021      Component Value Date/Time   NA 141 04/28/2022 1455   K 3.9 04/28/2022 1455   CL 100 04/28/2022 1455   CO2 24 04/28/2022 1455   GLUCOSE 64 (L) 04/28/2022 1455   GLUCOSE 146 (H) 03/30/2021 1147   BUN 49 (H) 04/28/2022 1455   CREATININE 1.15 04/28/2022 1455   CALCIUM 10.0 04/28/2022 1455   PROT 7.1 03/27/2021 2320   PROT 6.6 10/18/2020 1033   ALBUMIN 3.8 03/27/2021 2320   ALBUMIN 4.0 10/18/2020 1033   AST 28 03/27/2021 2320   ALT 37 03/27/2021 2320   ALKPHOS 42 03/27/2021 2320   BILITOT 0.6 03/27/2021 2320   BILITOT <0.2 10/18/2020 1033   GFRNONAA >60 03/27/2021 2320   GFRAA >60 12/27/2018 0653   Lab Results  Component Value Date   CHOL 233 (H) 05/13/2021   HDL 40 05/13/2021   LDLCALC 124 (H) 05/13/2021   TRIG 386 (H) 05/13/2021   CHOLHDL 5.8 (H) 05/13/2021   Lab Results  Component Value Date   HGBA1C 8.3 (H) 10/18/2020   No results found for: "VITAMINB12" Lab Results  Component Value Date   TSH 1.640 10/18/2020     ASSESSMENT AND PLAN 72 y.o. year old male  has a past medical history of Allergic rhinitis, seasonal, Asthma, Bronchitis, Chronic kidney disease,  stage I, collar bone, Degenerative joint disease of knee, Diabetes mellitus, GERD (gastroesophageal reflux disease), Heart murmur, History of acute prostatitis, History of hematuria, Hyperlipidemia, Hypertension, IgA nephropathy, OSA on CPAP, Pneumonia, and Sinusitis. here with   No diagnosis found.    ASKIA HAZELIP is doing well on CPAP therapy. Compliance report reveals ***. *** was encouraged to continue using CPAP nightly and for greater than 4 hours each night. We will update supply orders as indicated. Risks of untreated sleep apnea review and education materials provided. Healthy lifestyle habits encouraged. *** will follow up in ***, sooner if needed. *** verbalizes understanding and agreement with this plan.    No orders of the defined types were placed in this encounter.    No orders of the defined types were placed in this encounter.     Shawnie Dapper, FNP-C 08/23/2022, 10:31 AM [facility] [address] [place], [place] [address] [phone]

## 2022-08-23 NOTE — Patient Instructions (Incomplete)
Below is our plan:  We will continue memantine 10mg  twice daily.   Please continue using your CPAP regularly. While your insurance requires that you use CPAP at least 4 hours each night on 70% of the nights, I recommend, that you not skip any nights and use it throughout the night if you can. Getting used to CPAP and staying with the treatment long term does take time and patience and discipline. Untreated obstructive sleep apnea when it is moderate to severe can have an adverse impact on cardiovascular health and raise her risk for heart disease, arrhythmias, hypertension, congestive heart failure, stroke and diabetes. Untreated obstructive sleep apnea causes sleep disruption, nonrestorative sleep, and sleep deprivation. This can have an impact on your day to day functioning and cause daytime sleepiness and impairment of cognitive function, memory loss, mood disturbance, and problems focussing. Using CPAP regularly can improve these symptoms.  We will update supply orders, today.  Please make sure you are staying well hydrated. I recommend 50-60 ounces daily. Well balanced diet and regular exercise encouraged. Consistent sleep schedule with 6-8 hours recommended.   Please continue follow up with care team as directed.   Follow up with *** in ***  You may receive a survey regarding today's visit. I encourage you to leave honest feed back as I do use this information to improve patient care. Thank you for seeing me today!

## 2022-08-24 ENCOUNTER — Ambulatory Visit: Payer: PPO | Admitting: Family Medicine

## 2022-08-24 ENCOUNTER — Encounter: Payer: Self-pay | Admitting: Family Medicine

## 2022-08-24 VITALS — BP 102/70 | HR 85 | Ht 68.0 in | Wt 195.6 lb

## 2022-08-24 DIAGNOSIS — G4734 Idiopathic sleep related nonobstructive alveolar hypoventilation: Secondary | ICD-10-CM

## 2022-08-24 DIAGNOSIS — G3184 Mild cognitive impairment, so stated: Secondary | ICD-10-CM

## 2022-08-24 DIAGNOSIS — G4733 Obstructive sleep apnea (adult) (pediatric): Secondary | ICD-10-CM

## 2022-08-24 DIAGNOSIS — G4736 Sleep related hypoventilation in conditions classified elsewhere: Secondary | ICD-10-CM

## 2022-08-24 MED ORDER — MEMANTINE HCL 10 MG PO TABS: 10.0000 mg | ORAL_TABLET | Freq: Two times a day (BID) | ORAL | 3 refills | Status: DC

## 2022-08-27 ENCOUNTER — Encounter: Payer: Self-pay | Admitting: Cardiovascular Disease

## 2022-08-28 DIAGNOSIS — M5459 Other low back pain: Secondary | ICD-10-CM | POA: Diagnosis not present

## 2022-08-28 DIAGNOSIS — M6281 Muscle weakness (generalized): Secondary | ICD-10-CM | POA: Diagnosis not present

## 2022-08-28 DIAGNOSIS — M2560 Stiffness of unspecified joint, not elsewhere classified: Secondary | ICD-10-CM | POA: Diagnosis not present

## 2022-08-30 DIAGNOSIS — M6281 Muscle weakness (generalized): Secondary | ICD-10-CM | POA: Diagnosis not present

## 2022-08-30 DIAGNOSIS — M2560 Stiffness of unspecified joint, not elsewhere classified: Secondary | ICD-10-CM | POA: Diagnosis not present

## 2022-08-30 DIAGNOSIS — M5459 Other low back pain: Secondary | ICD-10-CM | POA: Diagnosis not present

## 2022-09-03 DIAGNOSIS — G4733 Obstructive sleep apnea (adult) (pediatric): Secondary | ICD-10-CM | POA: Diagnosis not present

## 2022-09-05 DIAGNOSIS — Z6830 Body mass index (BMI) 30.0-30.9, adult: Secondary | ICD-10-CM | POA: Diagnosis not present

## 2022-09-05 DIAGNOSIS — M47816 Spondylosis without myelopathy or radiculopathy, lumbar region: Secondary | ICD-10-CM | POA: Diagnosis not present

## 2022-09-06 DIAGNOSIS — M2560 Stiffness of unspecified joint, not elsewhere classified: Secondary | ICD-10-CM | POA: Diagnosis not present

## 2022-09-06 DIAGNOSIS — M6281 Muscle weakness (generalized): Secondary | ICD-10-CM | POA: Diagnosis not present

## 2022-09-06 DIAGNOSIS — M5459 Other low back pain: Secondary | ICD-10-CM | POA: Diagnosis not present

## 2022-09-15 DIAGNOSIS — G4730 Sleep apnea, unspecified: Secondary | ICD-10-CM | POA: Diagnosis not present

## 2022-09-15 DIAGNOSIS — Z6828 Body mass index (BMI) 28.0-28.9, adult: Secondary | ICD-10-CM | POA: Diagnosis not present

## 2022-09-15 DIAGNOSIS — I5040 Unspecified combined systolic (congestive) and diastolic (congestive) heart failure: Secondary | ICD-10-CM | POA: Diagnosis not present

## 2022-09-15 DIAGNOSIS — F0390 Unspecified dementia without behavioral disturbance: Secondary | ICD-10-CM | POA: Diagnosis not present

## 2022-09-15 DIAGNOSIS — G4736 Sleep related hypoventilation in conditions classified elsewhere: Secondary | ICD-10-CM | POA: Diagnosis not present

## 2022-09-15 DIAGNOSIS — J849 Interstitial pulmonary disease, unspecified: Secondary | ICD-10-CM | POA: Diagnosis not present

## 2022-09-15 DIAGNOSIS — N182 Chronic kidney disease, stage 2 (mild): Secondary | ICD-10-CM | POA: Diagnosis not present

## 2022-09-19 DIAGNOSIS — M47816 Spondylosis without myelopathy or radiculopathy, lumbar region: Secondary | ICD-10-CM | POA: Diagnosis not present

## 2022-09-19 DIAGNOSIS — M5416 Radiculopathy, lumbar region: Secondary | ICD-10-CM | POA: Diagnosis not present

## 2022-09-19 DIAGNOSIS — M6281 Muscle weakness (generalized): Secondary | ICD-10-CM | POA: Diagnosis not present

## 2022-09-19 DIAGNOSIS — M2560 Stiffness of unspecified joint, not elsewhere classified: Secondary | ICD-10-CM | POA: Diagnosis not present

## 2022-09-19 DIAGNOSIS — M5459 Other low back pain: Secondary | ICD-10-CM | POA: Diagnosis not present

## 2022-09-19 DIAGNOSIS — Z6830 Body mass index (BMI) 30.0-30.9, adult: Secondary | ICD-10-CM | POA: Diagnosis not present

## 2022-09-20 ENCOUNTER — Other Ambulatory Visit: Payer: Self-pay | Admitting: Neurological Surgery

## 2022-09-20 ENCOUNTER — Telehealth: Payer: Self-pay | Admitting: *Deleted

## 2022-09-20 NOTE — Telephone Encounter (Signed)
   Pre-operative Risk Assessment    Patient Name: Donald [name]  DOB: Jun 28, 1950 MRN: [id number]      Request for Surgical Clearance    Procedure:   L4-5 Lumbar Fusion  Date of Surgery:  Clearance 09/29/22                                 Surgeon:  Dr. Marikay Alar Surgeon's Group or Practice Name:  [facility] Phone number:  [phone] Fax number:  [phone]   Type of Clearance Requested:   - Medical    Type of Anesthesia:  General    Additional requests/questions:  Patient was seen by Dr. Royann Shivers on April 15,2024.  Signed, Emmit Pomfret   09/20/2022, 4:08 PM

## 2022-09-20 NOTE — Telephone Encounter (Signed)
Dr. Royann Shivers, you saw this patient on 08/21/2022 who now seeks cardiac risk assessment for lumbar fusion. There are no medications requested to be held. Could you please comment on cardiac risk assessment and route your response to p cv div preop?  Thank you, Marcelino Duster

## 2022-09-20 NOTE — Telephone Encounter (Signed)
He has well compensated CHF. Low risk for CV complications with spine surgery. Thank you.

## 2022-09-21 DIAGNOSIS — M5459 Other low back pain: Secondary | ICD-10-CM | POA: Diagnosis not present

## 2022-09-21 DIAGNOSIS — M6281 Muscle weakness (generalized): Secondary | ICD-10-CM | POA: Diagnosis not present

## 2022-09-21 DIAGNOSIS — M2560 Stiffness of unspecified joint, not elsewhere classified: Secondary | ICD-10-CM | POA: Diagnosis not present

## 2022-09-21 NOTE — Telephone Encounter (Signed)
   Primary Cardiologist: Thurmon Fair, MD  Chart reviewed as part of pre-operative protocol coverage. Given past medical history and time since last visit, based on ACC/AHA guidelines, Donald [name] would be at acceptable risk for the planned procedure without further cardiovascular testing.   Patient was advised that if he develops new symptoms prior to surgery to contact our office to arrange a follow-up appointment.  He verbalized understanding.  I will route this recommendation to the requesting party via Epic fax function and remove from pre-op pool.  Please call with questions.  Levi Aland, NP-C  09/21/2022, 8:04 AM [address], [address] Office [phone] Fax [phone]

## 2022-09-22 DIAGNOSIS — H5713 Ocular pain, bilateral: Secondary | ICD-10-CM | POA: Diagnosis not present

## 2022-09-22 DIAGNOSIS — H04123 Dry eye syndrome of bilateral lacrimal glands: Secondary | ICD-10-CM | POA: Diagnosis not present

## 2022-09-25 NOTE — Progress Notes (Signed)
Surgical Instructions    Your procedure is scheduled on Friday, Sep 29, 2022.  Report to [facility] Main Entrance "A" at 11:00 A.M., then check in with the Admitting office.  Call this number if you have problems the morning of surgery:  [phone]   If you have any questions prior to your surgery date call [phone]: Open Monday-Friday 8am-4pm If you experience any cold or flu symptoms such as cough, fever, chills, shortness of breath, etc. between now and your scheduled surgery, please notify us at the above number     Remember:  Do not eat after midnight the night before your surgery  You may drink clear liquids until 10:30 the morning of your surgery.   Clear liquids allowed are: Water, Non-Citrus Juices (without pulp), Carbonated Beverages, Clear Tea, Black Coffee ONLY (NO MILK, CREAM OR POWDERED CREAMER of any kind), and Gatorade.    Take these medicines the morning of surgery with A SIP OF WATER:  Cymbalta Proscar Namenda Metoprolol  Protonix  Crestor  If needed: Hydrocodone-Acetaminophen  As of today, STOP taking any Aspirin (unless otherwise instructed by your surgeon) Aleve, Naproxen, Ibuprofen, Motrin, Advil, Goody's, BC's, all herbal medications, fish oil, and all vitamins.  WHAT DO I DO ABOUT MY DIABETES MEDICATION?   Do not take Metformin the morning of surgery.  THE NIGHT BEFORE SURGERY, take 50 units of Insulin Degludec Evaristo Bury Flextouch).    THE MORNING OF SURGERY, take 50 units of Insulin Degludec Evaristo Bury Flextouch).  The day of surgery, do not take other diabetes injectables, including Byetta (exenatide), Bydureon (exenatide ER), Victoza (liraglutide), or Trulicity (dulaglutide).  If your CBG is greater than 220 mg/dL, you may take  of your sliding scale (correction) dose of insulin.   HOW TO MANAGE YOUR DIABETES BEFORE AND AFTER SURGERY  Why is it important to control my blood sugar before and after surgery? Improving blood sugar levels  before and after surgery helps healing and can limit problems. A way of improving blood sugar control is eating a healthy diet by:  Eating less sugar and carbohydrates  Increasing activity/exercise  Talking with your doctor about reaching your blood sugar goals High blood sugars (greater than 180 mg/dL) can raise your risk of infections and slow your recovery, so you will need to focus on controlling your diabetes during the weeks before surgery. Make sure that the doctor who takes care of your diabetes knows about your planned surgery including the date and location.  How do I manage my blood sugar before surgery? Check your blood sugar at least 4 times a day, starting 2 days before surgery, to make sure that the level is not too high or low.  Check your blood sugar the morning of your surgery when you wake up and every 2 hours until you get to the Short Stay unit.  If your blood sugar is less than 70 mg/dL, you will need to treat for low blood sugar: Do not take insulin. Treat a low blood sugar (less than 70 mg/dL) with  cup of clear juice (cranberry or apple), 4 glucose tablets, OR glucose gel. Recheck blood sugar in 15 minutes after treatment (to make sure it is greater than 70 mg/dL). If your blood sugar is not greater than 70 mg/dL on recheck, call [phone] for further instructions. Report your blood sugar to the short stay nurse when you get to Short Stay.  If you are admitted to the hospital after surgery: Your blood sugar will be checked by  the staff and you will probably be given insulin after surgery (instead of oral diabetes medicines) to make sure you have good blood sugar levels. The goal for blood sugar control after surgery is 80-180 mg/dL.   Special instructions:    Oral Hygiene is also important to reduce your risk of infection.  Remember - BRUSH YOUR TEETH THE MORNING OF SURGERY WITH YOUR REGULAR TOOTHPASTE   [place]- Preparing For Surgery   Pre-operative 5  CHG Bath Instructions   You can play a key role in reducing the risk of infection after surgery. Your skin needs to be as free of germs as possible. You can reduce the number of germs on your skin by washing with CHG (chlorhexidine gluconate) soap before surgery. CHG is an antiseptic soap that kills germs and continues to kill germs even after washing.   DO NOT use if you have an allergy to chlorhexidine/CHG or antibacterial soaps. If your skin becomes reddened or irritated, stop using the CHG and notify one of our RNs at [phone].   Please shower with the CHG soap starting 4 days before surgery using the following schedule:     Please keep in mind the following:  DO NOT shave, including legs and underarms, starting the day of your first shower.   You may shave your face at any point before/day of surgery.  Place clean sheets on your bed the day you start using CHG soap. Use a clean washcloth (not used since being washed) for each shower. DO NOT sleep with pets once you start using the CHG.   CHG Shower Instructions:  If you choose to wash your hair and private area, wash first with your normal shampoo/soap.  After you use shampoo/soap, rinse your hair and body thoroughly to remove shampoo/soap residue.  Turn the water OFF and apply about 3 tablespoons (45 ml) of CHG soap to a CLEAN washcloth.  Apply CHG soap ONLY FROM YOUR NECK DOWN TO YOUR TOES (washing for 3-5 minutes)  DO NOT use CHG soap on face, private areas, open wounds, or sores.  Pay special attention to the area where your surgery is being performed.  If you are having back surgery, having someone wash your back for you may be helpful. Wait 2 minutes after CHG soap is applied, then you may rinse off the CHG soap.  Pat dry with a clean towel  Put on clean clothes/pajamas   If you choose to wear lotion, please use ONLY the CHG-compatible lotions on the back of this paper.     Additional instructions for the day of  surgery: DO NOT APPLY any lotions, deodorants, cologne, or perfumes.   Put on clean/comfortable clothes.  Brush your teeth.  Ask your nurse before applying any prescription medications to the skin.      CHG Compatible Lotions   Aveeno Moisturizing lotion  Cetaphil Moisturizing Cream  Cetaphil Moisturizing Lotion  Clairol Herbal Essence Moisturizing Lotion, Dry Skin  Clairol Herbal Essence Moisturizing Lotion, Extra Dry Skin  Clairol Herbal Essence Moisturizing Lotion, Normal Skin  Curel Age Defying Therapeutic Moisturizing Lotion with Alpha Hydroxy  Curel Extreme Care Body Lotion  Curel Soothing Hands Moisturizing Hand Lotion  Curel Therapeutic Moisturizing Cream, Fragrance-Free  Curel Therapeutic Moisturizing Lotion, Fragrance-Free  Curel Therapeutic Moisturizing Lotion, Original Formula  Eucerin Daily Replenishing Lotion  Eucerin Dry Skin Therapy Plus Alpha Hydroxy Crme  Eucerin Dry Skin Therapy Plus Alpha Hydroxy Lotion  Eucerin Original Crme  Eucerin Original Lotion  Eucerin Plus  Crme Eucerin Plus Lotion  Eucerin TriLipid Replenishing Lotion  Keri Anti-Bacterial Hand Lotion  Keri Deep Conditioning Original Lotion Dry Skin Formula Softly Scented  Keri Deep Conditioning Original Lotion, Fragrance Free Sensitive Skin Formula  Keri Lotion Fast Absorbing Fragrance Free Sensitive Skin Formula  Keri Lotion Fast Absorbing Softly Scented Dry Skin Formula  Keri Original Lotion  Keri Skin Renewal Lotion Keri Silky Smooth Lotion  Keri Silky Smooth Sensitive Skin Lotion  Nivea Body Creamy Conditioning Oil  Nivea Body Extra Enriched Teacher, adult education Moisturizing Lotion Nivea Crme  Nivea Skin Firming Lotion  NutraDerm 30 Skin Lotion  NutraDerm Skin Lotion  NutraDerm Therapeutic Skin Cream  NutraDerm Therapeutic Skin Lotion  ProShield Protective Hand Cream  Provon moisturizing lotion   Do not wear jewelry or makeup. Do not wear lotions,  powders, perfumes/cologne or deodorant. Do not shave 48 hours prior to surgery.  Men may shave face and neck. Do not bring valuables to the hospital. Do not wear nail polish, gel polish, artificial nails, or any other type of covering on natural nails (fingers and toes) If you have artificial nails or gel coating that need to be removed by a nail salon, please have this removed prior to surgery. Artificial nails or gel coating may interfere with anesthesia's ability to adequately monitor your vital signs.  [facility] is not responsible for any belongings or valuables.    Do NOT Smoke (Tobacco/Vaping)  24 hours prior to your procedure  If you use a CPAP at night, you may bring your mask for your overnight stay.   Contacts, glasses, hearing aids, dentures or partials may not be worn into surgery, please bring cases for these belongings   For patients admitted to the hospital, discharge time will be determined by your treatment team.   Patients discharged the day of surgery will not be allowed to drive home, and someone needs to stay with them for 24 hours.   SURGICAL WAITING ROOM VISITATION Patients having surgery or a procedure may have no more than 2 support people in the waiting area - these visitors may rotate.   Children under the age of 74 must have an adult with them who is not the patient. If the patient needs to stay at the hospital during part of their recovery, the visitor guidelines for inpatient rooms apply. Pre-op nurse will coordinate an appropriate time for 1 support person to accompany patient in pre-op.  This support person may not rotate.   Please refer to [email or web address] for the visitor guidelines for Inpatients (after your surgery is over and you are in a regular room).   If you received a COVID test during your pre-op visit, it is requested that you wear a mask when out in public, stay away from anyone that may not  be feeling well, and notify your surgeon if you develop symptoms. If you have been in contact with anyone that has tested positive in the last 10 days, please notify your surgeon.    Please read over the following fact sheets that you were given.

## 2022-09-26 ENCOUNTER — Inpatient Hospital Stay (HOSPITAL_COMMUNITY): Admission: RE | Admit: 2022-09-26 | Payer: PPO | Source: Ambulatory Visit

## 2022-09-27 ENCOUNTER — Encounter (HOSPITAL_COMMUNITY): Payer: Self-pay

## 2022-09-27 ENCOUNTER — Encounter (HOSPITAL_COMMUNITY)
Admission: RE | Admit: 2022-09-27 | Discharge: 2022-09-27 | Disposition: A | Discharge status: Home / Self Care | Payer: PPO | Source: Ambulatory Visit | Attending: Neurological Surgery | Admitting: Neurological Surgery

## 2022-09-27 ENCOUNTER — Other Ambulatory Visit: Payer: Self-pay

## 2022-09-27 VITALS — BP 112/69 | HR 73 | Temp 98.0°F | Resp 18 | Ht 68.0 in | Wt 200.6 lb

## 2022-09-27 DIAGNOSIS — I428 Other cardiomyopathies: Secondary | ICD-10-CM | POA: Diagnosis not present

## 2022-09-27 DIAGNOSIS — K759 Inflammatory liver disease, unspecified: Secondary | ICD-10-CM

## 2022-09-27 DIAGNOSIS — M5416 Radiculopathy, lumbar region: Secondary | ICD-10-CM | POA: Diagnosis not present

## 2022-09-27 DIAGNOSIS — I130 Hypertensive heart and chronic kidney disease with heart failure and stage 1 through stage 4 chronic kidney disease, or unspecified chronic kidney disease: Secondary | ICD-10-CM | POA: Insufficient documentation

## 2022-09-27 DIAGNOSIS — E1122 Type 2 diabetes mellitus with diabetic chronic kidney disease: Secondary | ICD-10-CM | POA: Diagnosis not present

## 2022-09-27 DIAGNOSIS — G4733 Obstructive sleep apnea (adult) (pediatric): Secondary | ICD-10-CM | POA: Insufficient documentation

## 2022-09-27 DIAGNOSIS — E119 Type 2 diabetes mellitus without complications: Secondary | ICD-10-CM

## 2022-09-27 DIAGNOSIS — Z01812 Encounter for preprocedural laboratory examination: Secondary | ICD-10-CM | POA: Diagnosis not present

## 2022-09-27 DIAGNOSIS — I5042 Chronic combined systolic (congestive) and diastolic (congestive) heart failure: Secondary | ICD-10-CM | POA: Insufficient documentation

## 2022-09-27 DIAGNOSIS — Z01818 Encounter for other preprocedural examination: Secondary | ICD-10-CM

## 2022-09-27 DIAGNOSIS — K769 Liver disease, unspecified: Secondary | ICD-10-CM

## 2022-09-27 DIAGNOSIS — I2510 Atherosclerotic heart disease of native coronary artery without angina pectoris: Secondary | ICD-10-CM | POA: Diagnosis not present

## 2022-09-27 DIAGNOSIS — M48061 Spinal stenosis, lumbar region without neurogenic claudication: Secondary | ICD-10-CM | POA: Insufficient documentation

## 2022-09-27 DIAGNOSIS — Z794 Long term (current) use of insulin: Secondary | ICD-10-CM | POA: Diagnosis not present

## 2022-09-27 HISTORY — DX: Other symptoms and signs involving cognitive functions and awareness: R41.89

## 2022-09-27 HISTORY — DX: Personal history of other diseases of the digestive system: Z87.19

## 2022-09-27 HISTORY — DX: Fatty (change of) liver, not elsewhere classified: K76.0

## 2022-09-27 HISTORY — DX: Cardiac arrhythmia, unspecified: I49.9

## 2022-09-27 HISTORY — DX: Headache, unspecified: R51.9

## 2022-09-27 HISTORY — DX: Heart failure, unspecified: I50.9

## 2022-09-27 LAB — COMPREHENSIVE METABOLIC PANEL
ALT: 33 U/L (ref 0–44)
AST: 32 U/L (ref 15–41)
Albumin: 3.7 g/dL (ref 3.5–5.0)
Alkaline Phosphatase: 42 U/L (ref 38–126)
Anion gap: 10 (ref 5–15)
BUN: 58 mg/dL — ABNORMAL HIGH (ref 8–23)
CO2: 23 mmol/L (ref 22–32)
Calcium: 9.6 mg/dL (ref 8.9–10.3)
Chloride: 103 mmol/L (ref 98–111)
Creatinine, Ser: 1.15 mg/dL (ref 0.61–1.24)
GFR, Estimated: >60 mL/min (ref >60)
Glucose, Bld: 73 mg/dL (ref 70–99)
Potassium: 3.6 mmol/L (ref 3.5–5.1)
Sodium: 136 mmol/L (ref 135–145)
Total Bilirubin: 0.6 mg/dL (ref 0.3–1.2)
Total Protein: 7.1 g/dL (ref 6.5–8.1)

## 2022-09-27 LAB — TYPE AND SCREEN
ABO/RH(D): A POS
Antibody Screen: NEGATIVE

## 2022-09-27 LAB — CBC
HCT: 42.4 % (ref 39.0–52.0)
Hemoglobin: 12.8 g/dL — ABNORMAL LOW (ref 13.0–17.0)
MCH: 24 pg — ABNORMAL LOW (ref 26.0–34.0)
MCHC: 30.2 g/dL (ref 30.0–36.0)
MCV: 79.4 fL — ABNORMAL LOW (ref 80.0–100.0)
Platelets: 283 10*3/uL (ref 150–400)
RBC: 5.34 MIL/uL (ref 4.22–5.81)
RDW: 17.4 % — ABNORMAL HIGH (ref 11.5–15.5)
WBC: 9 10*3/uL (ref 4.0–10.5)
nRBC: 0 % (ref 0.0–0.2)

## 2022-09-27 LAB — GLUCOSE, CAPILLARY: Glucose-Capillary: 147 mg/dL — ABNORMAL HIGH (ref 70–99)

## 2022-09-27 LAB — SURGICAL PCR SCREEN
MRSA, PCR: NEGATIVE
Staphylococcus aureus: NEGATIVE

## 2022-09-27 LAB — PROTIME-INR
INR: 0.9 (ref 0.8–1.2)
Prothrombin Time: 12.3 seconds (ref 11.4–15.2)

## 2022-09-27 NOTE — Progress Notes (Signed)
PCP - Dr. Chilton Greathouse Cardiologist - Dr. Lorelle Gibbs Croitoru  PPM/ICD - Denies Device Orders - n/a Rep Notified - n/a  Chest x-ray - n/a EKG - 04/07/2022 Stress Test - 07/21/2014 ECHO - 08/09/2022 Cardiac Cath - 09/29/2011  Sleep Study - +OSA. Pt wears CPAP with 3-4L O2 bled into machine. He is unaware of his pressure settings. He states that his oxygen drops to 70-80% when he sleeps or when he is resting. He wears his CPAP when napping during the day as well.  Pt is DM2. He has a Libre 2 CGM that is currently on his left thigh. Normal fasting sugars as 140-180. CBG at pre-op appointment 147. So far today he has had eggs, crystal light and a protein shake. Last A1c in CE was 8.5 on August 16, 2022  Last dose of GLP1 agonist- n/a GLP1 instructions: n/a  Blood Thinner Instructions: n/a Aspirin Instructions: n/a  ERAS Protcol - Clear liquids until 1000 morning of surgery PRE-SURGERY Ensure or G2- n/a  COVID TEST- n/a   Anesthesia review: Yes. Discussed with Shonna Chock, PA-C. Pt concerned about his oxygen levels after receiving anesthesia and once he gets on the floor. Pt instructed to bring his CPAP mask with him to the hospital and we can have respiratory therapy assess him with using it during his stay.   Patient denies shortness of breath, fever, cough and chest pain at PAT appointment. Pt denies any respiratory illness/infection. He does endorse a chronic cough that he has had for 3+ years.   All instructions explained to the patient, with a verbal understanding of the material. Patient agrees to go over the instructions while at home for a better understanding. Patient also instructed to self quarantine after being tested for COVID-19. The opportunity to ask questions was provided.

## 2022-09-27 NOTE — Pre-Procedure Instructions (Signed)
Surgical Instructions    Your procedure is scheduled on Sep 29, 2022.  Report to [facility] Main Entrance "A" at 11:00 A.M., then check in with the Admitting office.  Call this number if you have problems the morning of surgery:  [phone]  If you have any questions prior to your surgery date call [phone]: Open Monday-Friday 8am-4pm If you experience any cold or flu symptoms such as cough, fever, chills, shortness of breath, etc. between now and your scheduled surgery, please notify us at the above number.     Remember:  Do not eat after midnight the night before your surgery  You may drink clear liquids until 10:00 AM the morning of your surgery.   Clear liquids allowed are: Water, Non-Citrus Juices (without pulp), Carbonated Beverages, Clear Tea, Black Coffee Only (NO MILK, CREAM OR POWDERED CREAMER of any kind), and Gatorade.     Take these medicines the morning of surgery with A SIP OF WATER:  DULoxetine (CYMBALTA)   finasteride (PROSCAR)   memantine (NAMENDA)   metoprolol succinate (TOPROL-XL)   pantoprazole (PROTONIX)   rosuvastatin (CRESTOR)   silodosin (RAPAFLO)    May take these medicines IF NEEDED:  HYDROcodone-acetaminophen (NORCO/VICODIN)    As of today, STOP taking any Aspirin (unless otherwise instructed by your surgeon) Aleve, Naproxen, Ibuprofen, Motrin, Advil, Goody's, BC's, all herbal medications, fish oil, and all vitamins.   WHAT DO I DO ABOUT MY DIABETES MEDICATION?   Do not take metFORMIN (GLUCOPHAGE) the morning of surgery.  THE MORNING OF SURGERY, take 50 units of insulin degludec (TRESIBA FLEXTOUCH).  If your CBG is greater than 220 mg/dL, you may take  of your sliding scale (correction) dose of insulin lispro (HUMALOG KWIKPEN).   HOW TO MANAGE YOUR DIABETES BEFORE AND AFTER SURGERY  Why is it important to control my blood sugar before and after surgery? Improving blood sugar levels before and after surgery helps healing and can limit  problems. A way of improving blood sugar control is eating a healthy diet by:  Eating less sugar and carbohydrates  Increasing activity/exercise  Talking with your doctor about reaching your blood sugar goals High blood sugars (greater than 180 mg/dL) can raise your risk of infections and slow your recovery, so you will need to focus on controlling your diabetes during the weeks before surgery. Make sure that the doctor who takes care of your diabetes knows about your planned surgery including the date and location.  How do I manage my blood sugar before surgery? Check your blood sugar at least 4 times a day, starting 2 days before surgery, to make sure that the level is not too high or low.  Check your blood sugar the morning of your surgery when you wake up and every 2 hours until you get to the Short Stay unit.  If your blood sugar is less than 70 mg/dL, you will need to treat for low blood sugar: Do not take insulin. Treat a low blood sugar (less than 70 mg/dL) with  cup of clear juice (cranberry or apple), 4 glucose tablets, OR glucose gel. Recheck blood sugar in 15 minutes after treatment (to make sure it is greater than 70 mg/dL). If your blood sugar is not greater than 70 mg/dL on recheck, call [phone] for further instructions. Report your blood sugar to the short stay nurse when you get to Short Stay.  If you are admitted to the hospital after surgery: Your blood sugar will be checked by the staff and  you will probably be given insulin after surgery (instead of oral diabetes medicines) to make sure you have good blood sugar levels. The goal for blood sugar control after surgery is 80-180 mg/dL.                      Do NOT Smoke (Tobacco/Vaping) for 24 hours prior to your procedure.  If you use a CPAP at night, you may bring your mask/headgear for your overnight stay.   Contacts, glasses, piercing's, hearing aid's, dentures or partials may not be worn into surgery, please  bring cases for these belongings.    For patients admitted to the hospital, discharge time will be determined by your treatment team.   Patients discharged the day of surgery will not be allowed to drive home, and someone needs to stay with them for 24 hours.  SURGICAL WAITING ROOM VISITATION Patients having surgery or a procedure may have no more than 2 support people in the waiting area - these visitors may rotate.   Children under the age of 70 must have an adult with them who is not the patient. If the patient needs to stay at the hospital during part of their recovery, the visitor guidelines for inpatient rooms apply. Pre-op nurse will coordinate an appropriate time for 1 support person to accompany patient in pre-op.  This support person may not rotate.   Please refer to the [facility] website for the visitor guidelines for Inpatients (after your surgery is over and you are in a regular room).    If you received a COVID test during your pre-op visit  it is requested that you wear a mask when out in public, stay away from anyone that may not be feeling well and notify your surgeon if you develop symptoms. If you have been in contact with anyone that has tested positive in the last 10 days please notify you surgeon.    Pre-operative 5 CHG Bath Instructions   You can play a key role in reducing the risk of infection after surgery. Your skin needs to be as free of germs as possible. You can reduce the number of germs on your skin by washing with CHG (chlorhexidine gluconate) soap before surgery. CHG is an antiseptic soap that kills germs and continues to kill germs even after washing.   DO NOT use if you have an allergy to chlorhexidine/CHG or antibacterial soaps. If your skin becomes reddened or irritated, stop using the CHG and notify one of our RNs at [phone].   Please shower with the CHG soap starting 4 days before surgery using the following schedule:     Please keep in mind  the following:  DO NOT shave, including legs and underarms, starting the day of your first shower.   You may shave your face at any point before/day of surgery.  Place clean sheets on your bed the day you start using CHG soap. Use a clean washcloth (not used since being washed) for each shower. DO NOT sleep with pets once you start using the CHG.   CHG Shower Instructions:  If you choose to wash your hair and private area, wash first with your normal shampoo/soap.  After you use shampoo/soap, rinse your hair and body thoroughly to remove shampoo/soap residue.  Turn the water OFF and apply about 3 tablespoons (45 ml) of CHG soap to a CLEAN washcloth.  Apply CHG soap ONLY FROM YOUR NECK DOWN TO YOUR TOES (washing for 3-5 minutes)  DO NOT use CHG soap on face, private areas, open wounds, or sores.  Pay special attention to the area where your surgery is being performed.  If you are having back surgery, having someone wash your back for you may be helpful. Wait 2 minutes after CHG soap is applied, then you may rinse off the CHG soap.  Pat dry with a clean towel  Put on clean clothes/pajamas   If you choose to wear lotion, please use ONLY the CHG-compatible lotions on the back of this paper.     Additional instructions for the day of surgery: DO NOT APPLY any lotions, deodorants, cologne, or perfumes.   Do not wear jewelry or makeup Do not bring valuables to the hospital. [facility] is not responsible for any belongings or valuables. Do not wear nail polish, gel polish, artificial nails, or any other type of covering on natural nails (fingers and toes) Put on clean/comfortable clothes.  Brush your teeth.  Ask your nurse before applying any prescription medications to the skin.      CHG Compatible Lotions   Aveeno Moisturizing lotion  Cetaphil Moisturizing Cream  Cetaphil Moisturizing Lotion  Clairol Herbal Essence Moisturizing Lotion, Dry Skin  Clairol Herbal Essence Moisturizing  Lotion, Extra Dry Skin  Clairol Herbal Essence Moisturizing Lotion, Normal Skin  Curel Age Defying Therapeutic Moisturizing Lotion with Alpha Hydroxy  Curel Extreme Care Body Lotion  Curel Soothing Hands Moisturizing Hand Lotion  Curel Therapeutic Moisturizing Cream, Fragrance-Free  Curel Therapeutic Moisturizing Lotion, Fragrance-Free  Curel Therapeutic Moisturizing Lotion, Original Formula  Eucerin Daily Replenishing Lotion  Eucerin Dry Skin Therapy Plus Alpha Hydroxy Crme  Eucerin Dry Skin Therapy Plus Alpha Hydroxy Lotion  Eucerin Original Crme  Eucerin Original Lotion  Eucerin Plus Crme Eucerin Plus Lotion  Eucerin TriLipid Replenishing Lotion  Keri Anti-Bacterial Hand Lotion  Keri Deep Conditioning Original Lotion Dry Skin Formula Softly Scented  Keri Deep Conditioning Original Lotion, Fragrance Free Sensitive Skin Formula  Keri Lotion Fast Absorbing Fragrance Free Sensitive Skin Formula  Keri Lotion Fast Absorbing Softly Scented Dry Skin Formula  Keri Original Lotion  Keri Skin Renewal Lotion Keri Silky Smooth Lotion  Keri Silky Smooth Sensitive Skin Lotion  Nivea Body Creamy Conditioning Oil  Nivea Body Extra Enriched Lotion  Nivea Body Original Lotion  Nivea Body Sheer Moisturizing Lotion Nivea Crme  Nivea Skin Firming Lotion  NutraDerm 30 Skin Lotion  NutraDerm Skin Lotion  NutraDerm Therapeutic Skin Cream  NutraDerm Therapeutic Skin Lotion  ProShield Protective Hand Cream  Provon moisturizing lotion   Please read over the following fact sheets that you were given.

## 2022-09-28 NOTE — Anesthesia Preprocedure Evaluation (Addendum)
Anesthesia Evaluation    Reviewed: Allergy & Precautions, Patient's Chart, lab work & pertinent test results  History of Anesthesia Complications Negative for: history of anesthetic complications  Airway Mallampati: I  TM Distance: >3 FB Neck ROM: Full    Dental  (+) Teeth Intact   Pulmonary asthma , sleep apnea, Continuous Positive Airway Pressure Ventilation and Oxygen sleep apnea    Pulmonary exam normal        Cardiovascular hypertension, Pt. on medications and Pt. on home beta blockers +CHF   Rhythm:Regular Rate:Normal  Nonischemic cardiomyopathy  TTE 08/2022 IMPRESSIONS     1. Left ventricular ejection fraction, by estimation, is 40%. The left  ventricle has moderately decreased function. The left ventricle  demonstrates global hypokinesis. The left ventricular internal cavity size  was mildly dilated. Left ventricular  diastolic parameters are consistent with Grade I diastolic dysfunction  (impaired relaxation). There is abnromal septal motion.   2. Right ventricular systolic function is normal. The right ventricular  size is mildly enlarged. Tricuspid regurgitation signal is inadequate for  assessing PA pressure.   3. The mitral valve is grossly normal. Trivial mitral valve  regurgitation. No evidence of mitral stenosis.   4. The aortic valve is tricuspid. There is mild thickening of the aortic  valve. Aortic valve regurgitation is not visualized. Aortic valve  sclerosis/calcification is present, without any evidence of aortic  stenosis.   5. The inferior vena cava is normal in size with greater than 50%  respiratory variability, suggesting right atrial pressure of 3 mmHg.   Comparison(s): No significant change from prior study. Prior images  reviewed side by side.     Neuro/Psych negative neurological ROS  negative psych ROS   GI/Hepatic Neg liver ROS,GERD  Medicated and Controlled,,Hx of colon polyps    Endo/Other  diabetes, Well Controlled, Type 2, Oral Hypoglycemic Agents, Insulin Dependent    Renal/GU   negative genitourinary   Musculoskeletal  (+) Arthritis ,    Abdominal  (+)   Bowel sounds: normal.  Peds  Hematology  (+) Blood dyscrasia, anemia   Anesthesia Other Findings   Reproductive/Obstetrics                             Anesthesia Physical Anesthesia Plan  ASA: 3  Anesthesia Plan: General   Post-op Pain Management: Tylenol PO (pre-op)*   Induction: Intravenous  PONV Risk Score and Plan: 3 and Ondansetron, Dexamethasone and Midazolam  Airway Management Planned: Oral ETT  Additional Equipment: None  Intra-op Plan:   Post-operative Plan: Extubation in OR  Informed Consent:   Plan Discussed with: Anesthesiologist  Anesthesia Plan Comments: (See PAT note written 09/28/2022 by Shonna Chock, PA-C.  He uses CPAP with 3-4 L O2 with sleep and even naps. He wants anesthesia and PACU staff aware to guide post-operative management.)        Anesthesia Quick Evaluation

## 2022-09-28 NOTE — Progress Notes (Signed)
Anesthesia Chart Review:  Case: [id number] Date/Time: 09/29/22 1245   Procedure: L4-L5-PLIF (Back)   Anesthesia type: General   Pre-op diagnosis: Stenosis   Location: [facility] ROOM 20 / [facility]   Surgeons: Tia Alert, MD       DISCUSSION: Patient is a 72 year old male scheduled for the above procedure.  History includes never smoker, HTN, DM2, asthma, OSA (uses CPAP with 3-4 L O2), CKD (IgA nephropathy), murmur, dysrhythmia (PVCs), chronic combined systolic and diastolic CHF, non-ischemic dilated cardiomyopathy (normal coronaries 2013), GERD, hiatal hernia, fatty liver, mild cognitive impairment, spinal surgery.  He is followed by cardiologist Dr. Ernest Pine fof CHF due to nonischemic cardiomyopathy with normal coronaries in 09/2011. EF was ~ 35-45% with moderate anterior wall hypokinesis by 09/2011 echo. In 03/2021, LVEF 35-40% and has remained ~ 40% on Entresto, Toprol, and spironolactone. He had an allergic response to SGLT2 inhibitors and with known frequent UTIs due to ureteral obstruction. Continue maximum medical therapy. Started on rosuvastatin for target LDL < 70. One year follow-up planned. 05/2022 CCTA showed no significant coronary stenosis by FFR. On 09/20/22, Dr. Royann Shivers wrote, "He has well compensated CHF. Low risk for CV complications with spine surgery."  He has severe OSA with nocturnal hypoxia with sats as low as "75%", so he uses 3-4 L O2 bled into this CPAP at night and with naps. He wants to make sure his anesthesia and PACU team are aware that he is dependent on CPAP with O2 when sleeping or sleep-like state with sedation.    Last A1c 8.5% on 08/16/22 (Care Everywhere). He wears a Libre 2 CGM and reported typical fasting CBGs ~ 140-180.  Result routed to Dr. Yetta Barre along with other PAT lab results.   Anesthesia team to evaluate on the day of surgery.    VS: BP 112/69   Pulse 73   Temp 36.7 C   Resp 18   Ht 5\' 8"  (1.727 m)   Wt 91 kg   SpO2 100%   BMI 30.50 kg/m     PROVIDERS: Chilton Greathouse, MD is PCP  Thurmon Fair, MD is cardiologist  Dohmeier, Porfirio Mylar, MD is neurologist. Last visit 08/24/22 with Shawnie Dapper, NP for follow-up OSA and mild cognitive impairment. Good compliance with CPAP. Continue memantine for cognitive dysfunction. Follow-up in one year.   LABS: Labs reviewed: Acceptable for surgery. (all labs ordered are listed, but only abnormal results are displayed)  Labs Reviewed  GLUCOSE, CAPILLARY - Abnormal; Notable for the following components:      Result Value   Glucose-Capillary 147 (*)    All other components within normal limits  COMPREHENSIVE METABOLIC PANEL - Abnormal; Notable for the following components:   BUN 58 (*)    All other components within normal limits  CBC - Abnormal; Notable for the following components:   Hemoglobin 12.8 (*)    MCV 79.4 (*)    MCH 24.0 (*)    RDW 17.4 (*)    All other components within normal limits  SURGICAL PCR SCREEN  PROTIME-INR  TYPE AND SCREEN    OTHER:  CPAP Titration Study 12/25/21: IMPRESSION:  1. Sleep-disordered breathing improved at the recommended  pressure of 10 cmH2O with 3 cm EPR under a FFM EVORA in size S/M.  Hypoxemia <5, "was resolved", if(@2 <20,"was mostly resolved",  "was not resolved"))' improved with CPAP therapy, but was not  resolved. Oxygen addition of only 1 L/ m was surprisingly not  well tolerated.  the total time of hypoxemia  under 10 cm water pressure on CPAP  with 3 cm EPR was reduced to 5.5 minutes (!) and AHI was 1.2/h.  The patient slept only in supine for this titration.   Home Sleep Study 10/01/18: Diagnosis:     The AHI ( Apnea Hypopnea Index) during this HST was 37.9/h and is  reflecting a severe degree of Obstructive Sleep Apnea. There were  over 270 of sleep in hypoxemia at or below 88% Spo2 recorded,  definitely indicating need for 02 supplementation.  Loud snoring was associated.  Please note that sleeping on the  left side had an  impact on lowering the AHI , but on the right  side the AHI was highest.     IMAGES: MRI L-spine 07/05/22: IMPRESSION: 1. Combination of congenitally small pedicles and degenerative changes at L4-L5 result in severe narrowing of the bilateral lateral recesses, moderate right and moderate to severe left neural foraminal narrowing. 2. Moderate spinal canal stenosis is also present at L2-L3.   EKG: 04/07/22: NSR   CV: Echo 08/09/22:  1. Left ventricular ejection fraction, by estimation, is 40%. The left  ventricle has moderately decreased function. The left ventricle  demonstrates global hypokinesis. The left ventricular internal cavity size  was mildly dilated. Left ventricular  diastolic parameters are consistent with Grade I diastolic dysfunction  (impaired relaxation). There is abnromal septal motion.   2. Right ventricular systolic function is normal. The right ventricular  size is mildly enlarged. Tricuspid regurgitation signal is inadequate for  assessing PA pressure.   3. The mitral valve is grossly normal. Trivial mitral valve  regurgitation. No evidence of mitral stenosis.   4. The aortic valve is tricuspid. There is mild thickening of the aortic  valve. Aortic valve regurgitation is not visualized. Aortic valve  sclerosis/calcification is present, without any evidence of aortic  stenosis.   5. The inferior vena cava is normal in size with greater than 50%  respiratory variability, suggesting right atrial pressure of 3 mmHg.  - Comparison(s): No significant change from prior study. Prior images  reviewed side by side.  [LVEF 35-40% 08/31/21 & 03/22/21, 35-45% 09/06/11]      CT Coronary 05/11/22: - Aorta: Normal size. Mild aortic root calcifications. No dissection. - Aortic Valve:  Trileaflet.  Mild calcifications. - Coronary Arteries:  Normal coronary origin.  Co-dominance.  RCA is a large dominant artery that gives rise to PDA and PLA. There is moderate (50-69%) mixed  lesion in the proximal portion of the RCA. The mid and distal RCA with no plaques. The right marginal artery comes off in the proximal portion of the vessel and it is a medium sized vessel. The PDA and PLA with no plaques.   Left main is a large artery that gives rise to LAD and LCX arteries.   LAD is a large vessel. There is a moderate (50-69%) calcified plaque in the ostial LAD into the proximal portion of the vessel. The mid and distal LAD with no plaques. D1 is a medium sized vessel with no plaques.   LCX is a co-dominant artery that gives rise to one large OM1 branch. The calcified plaque in the proximal LCX start as a moderate lesion and then terminates in a mild calcified plaque.   Coronary Calcium Score: Left main: 12 Left anterior descending artery: 295 Left circumflex artery: 417 Right coronary artery: 146 Total: 870 Percentile: 84   Other findings: Normal pulmonary vein drainage into the left atrium. Normal left atrial appendage without a  thrombus. Normal size of the pulmonary artery.   IMPRESSION: 1. Coronary calcium score of 870. This was 33 percentile for age and sex matched control. 2. Normal coronary origin with right dominance. 3. Moderate coronary artery disease. CAD-RADS 3. Moderate stenosis. Consider symptom-guided anti-ischemic pharmacotherapy as well as risk factor modification per guideline directed care. Additional analysis with CT FFR will be submitted.   FFR IMPRESSION/RECOMMENDATIONS1/4/243: CT FFR analysis showed no significant stenosis. Guideline-directed medical therapy and aggressive risk factor modification for secondary prevention of coronary artery disease.    Event monitor 04/25/22 - 04/29/22:   Rhythm is normal sinus rhythm with normal circadian variation.   There are rare isolated premature atrial contractions and a single 4 beat run of nonsustained atrial tachycardia.  Atrial fibrillation was not seen.   There were rare premature ventricular  complexes, very rare couplets and a brief period of ventricular try Gemini.  When tachycardia is not seen.   There is no significant bradycardia.   Symptom driven recordings show isolated premature atrial contractions or premature ventricular contractions.   Minimally abnormal event monitor.  Patient's symptoms are associated with isolated ectopic beats.   CPX 07/21/14: Conclusion: Exercise testing with gas-exchange demonstrates a  mild-moderately reduced functional capacity when compared to  matched sedentary norms. There appears to be a mild restriction  with possible ventilatory mechanic limitations with a breathing  reserve of 18.5. The primary cause of dyspnea is likely  attributed to different/combined factors ? Restrictive chest  Disease . There does not appear  to be any cardiovascular limitations.    Cardiac cath 09/29/11: IMPRESSIONS:  Angiographically normal coronary arteries. Normal left ventricular systolic function RECOMMENDATION:  Continue risk factor modification.  Past Medical History:  Diagnosis Date   Allergic rhinitis, seasonal    Asthma    Bronchitis    CHF (congestive heart failure) (HCC)    Chronic kidney disease, stage I    Cognitive impairment    Effects Short Term Memory   collar bone    dislocation left side 05/2014   Degenerative joint disease of knee    bilateral   Diabetes mellitus    Type 2   Dysrhythmia    PVCs   Fatty liver    GERD (gastroesophageal reflux disease)    Headache    Heachaces r/t eyes   Heart murmur    history of   History of acute prostatitis    History of hematuria    History of hiatal hernia    H/O   Hyperlipidemia    Hypertension    IgA nephropathy    OSA on CPAP    Pneumonia    Sinusitis     Past Surgical History:  Procedure Laterality Date   BIOPSY  05/17/2020   Procedure: BIOPSY;  Surgeon: Vida Rigger, MD;  Location: [facility];  Service: Endoscopy;;   CARDIAC CATHETERIZATION  09/29/2011   normal    CATARACT EXTRACTION Bilateral    CERVICAL SPINE SURGERY     COLONOSCOPY WITH PROPOFOL N/A 05/17/2020   Procedure: COLONOSCOPY WITH PROPOFOL;  Surgeon: Vida Rigger, MD;  Location: [facility];  Service: Endoscopy;  Laterality: N/A;   deviated septum     1990s   ESOPHAGOGASTRODUODENOSCOPY (EGD) WITH PROPOFOL N/A 05/17/2020   Procedure: ESOPHAGOGASTRODUODENOSCOPY (EGD) WITH PROPOFOL;  Surgeon: Vida Rigger, MD;  Location: [facility];  Service: Endoscopy;  Laterality: N/A;   HEMOSTASIS CLIP PLACEMENT  05/17/2020   Procedure: HEMOSTASIS CLIP PLACEMENT;  Surgeon: Vida Rigger, MD;  Location: [facility];  Service: Endoscopy;;   HERNIA REPAIR     LEFT HEART CATHETERIZATION WITH CORONARY ANGIOGRAM N/A 09/29/2011   Procedure: LEFT HEART CATHETERIZATION WITH CORONARY ANGIOGRAM;  Surgeon: Thurmon Fair, MD;  Location: [facility];  Service: Cardiovascular;  Laterality: N/A;   NM MYOVIEW LTD  07/08/2011   mild LV dilatation,mild septal hypokinesia   POLYPECTOMY  05/17/2020   Procedure: POLYPECTOMY;  Surgeon: Vida Rigger, MD;  Location: [facility];  Service: Endoscopy;;   TONSILLECTOMY     US ECHOCARDIOGRAPHY  09/07/2011   EF 35-45%,mod.ant hypokinesis,boderline LA & RA enlargement,trace MR,TR & PI    MEDICATIONS:  Ascorbic Acid (VITAMIN C) 500 MG CHEW   Continuous Blood Gluc Sensor (FREESTYLE LIBRE 14 DAY SENSOR) MISC   DULoxetine (CYMBALTA) 30 MG capsule   finasteride (PROSCAR) 5 MG tablet   furosemide (LASIX) 20 MG tablet   HYDROcodone-acetaminophen (NORCO/VICODIN) 5-325 MG tablet   insulin degludec (TRESIBA FLEXTOUCH) 100 UNIT/ML FlexTouch Pen   insulin lispro (HUMALOG KWIKPEN) 200 UNIT/ML KwikPen   Insulin Pen Needle (UNIFINE PENTIPS PLUS) 32G X 4 MM MISC   memantine (NAMENDA) 10 MG tablet   metFORMIN (GLUCOPHAGE) 1000 MG tablet   metoprolol succinate (TOPROL-XL) 50 MG 24 hr tablet   Multiple Vitamins-Minerals (CENTRUM SILVER 50+MEN PO)   Omega-3 Fatty Acids (FISH OIL) 1000 MG CPDR    pantoprazole (PROTONIX) 40 MG tablet   rosuvastatin (CRESTOR) 20 MG tablet   sacubitril-valsartan (ENTRESTO) 97-103 MG   silodosin (RAPAFLO) 8 MG CAPS capsule   spironolactone (ALDACTONE) 25 MG tablet   No current facility-administered medications for this encounter.     Shonna Chock, PA-C Surgical Short Stay/Anesthesiology [facility] Phone [phone] [facility] Phone [phone] 09/28/2022 11:01 AM

## 2022-09-29 ENCOUNTER — Encounter (HOSPITAL_COMMUNITY): Payer: Self-pay | Admitting: Neurological Surgery

## 2022-09-29 ENCOUNTER — Other Ambulatory Visit: Payer: Self-pay

## 2022-09-29 ENCOUNTER — Ambulatory Visit (HOSPITAL_COMMUNITY): Payer: PPO

## 2022-09-29 ENCOUNTER — Ambulatory Visit (HOSPITAL_BASED_OUTPATIENT_CLINIC_OR_DEPARTMENT_OTHER): Payer: PPO | Admitting: Anesthesiology

## 2022-09-29 ENCOUNTER — Observation Stay (HOSPITAL_COMMUNITY)
Admission: RE | Admit: 2022-09-29 | Discharge: 2022-09-30 | Disposition: A | Discharge status: Home / Self Care | Payer: PPO | Attending: Neurological Surgery | Admitting: Neurological Surgery

## 2022-09-29 ENCOUNTER — Ambulatory Visit (HOSPITAL_COMMUNITY): Payer: PPO | Admitting: Vascular Surgery

## 2022-09-29 ENCOUNTER — Encounter (HOSPITAL_COMMUNITY)
Admission: RE | Disposition: A | Discharge status: Home / Self Care | Payer: Self-pay | Source: Home / Self Care | Attending: Neurological Surgery

## 2022-09-29 DIAGNOSIS — M5116 Intervertebral disc disorders with radiculopathy, lumbar region: Secondary | ICD-10-CM | POA: Diagnosis not present

## 2022-09-29 DIAGNOSIS — I428 Other cardiomyopathies: Secondary | ICD-10-CM

## 2022-09-29 DIAGNOSIS — E1122 Type 2 diabetes mellitus with diabetic chronic kidney disease: Secondary | ICD-10-CM | POA: Diagnosis not present

## 2022-09-29 DIAGNOSIS — I509 Heart failure, unspecified: Secondary | ICD-10-CM | POA: Diagnosis not present

## 2022-09-29 DIAGNOSIS — E119 Type 2 diabetes mellitus without complications: Secondary | ICD-10-CM

## 2022-09-29 DIAGNOSIS — Z981 Arthrodesis status: Secondary | ICD-10-CM

## 2022-09-29 DIAGNOSIS — Z794 Long term (current) use of insulin: Secondary | ICD-10-CM | POA: Insufficient documentation

## 2022-09-29 DIAGNOSIS — J45909 Unspecified asthma, uncomplicated: Secondary | ICD-10-CM

## 2022-09-29 DIAGNOSIS — I110 Hypertensive heart disease with heart failure: Secondary | ICD-10-CM | POA: Diagnosis not present

## 2022-09-29 DIAGNOSIS — M48061 Spinal stenosis, lumbar region without neurogenic claudication: Secondary | ICD-10-CM | POA: Insufficient documentation

## 2022-09-29 DIAGNOSIS — Z7984 Long term (current) use of oral hypoglycemic drugs: Secondary | ICD-10-CM

## 2022-09-29 DIAGNOSIS — I130 Hypertensive heart and chronic kidney disease with heart failure and stage 1 through stage 4 chronic kidney disease, or unspecified chronic kidney disease: Secondary | ICD-10-CM | POA: Insufficient documentation

## 2022-09-29 DIAGNOSIS — N181 Chronic kidney disease, stage 1: Secondary | ICD-10-CM | POA: Insufficient documentation

## 2022-09-29 DIAGNOSIS — M4316 Spondylolisthesis, lumbar region: Principal | ICD-10-CM | POA: Insufficient documentation

## 2022-09-29 DIAGNOSIS — Z79899 Other long term (current) drug therapy: Secondary | ICD-10-CM | POA: Diagnosis not present

## 2022-09-29 DIAGNOSIS — M5416 Radiculopathy, lumbar region: Secondary | ICD-10-CM | POA: Diagnosis not present

## 2022-09-29 LAB — ABO/RH: ABO/RH(D): A POS

## 2022-09-29 LAB — GLUCOSE, CAPILLARY
Glucose-Capillary: 173 mg/dL — ABNORMAL HIGH (ref 70–99)
Glucose-Capillary: 159 mg/dL — ABNORMAL HIGH (ref 70–99)
Glucose-Capillary: 170 mg/dL — ABNORMAL HIGH (ref 70–99)
Glucose-Capillary: 237 mg/dL — ABNORMAL HIGH (ref 70–99)

## 2022-09-29 SURGERY — POSTERIOR LUMBAR FUSION 1 LEVEL
Anesthesia: General | Site: Back

## 2022-09-29 MED ORDER — INSULIN ASPART 100 UNIT/ML IJ SOLN: 0.0000 [IU] | Freq: Three times a day (TID) | INTRAMUSCULAR | Status: DC

## 2022-09-29 MED ORDER — THROMBIN 5000 UNITS EX SOLR
CUTANEOUS | Status: AC
  Filled 2022-09-29: qty 5000

## 2022-09-29 MED ORDER — PHENYLEPHRINE 80 MCG/ML (10ML) SYRINGE FOR IV PUSH (FOR BLOOD PRESSURE SUPPORT)
PREFILLED_SYRINGE | INTRAVENOUS | Status: DC | PRN
  Administered 2022-09-29 (×2): 80 ug via INTRAVENOUS

## 2022-09-29 MED ORDER — BUPIVACAINE HCL (PF) 0.25 % IJ SOLN
INTRAMUSCULAR | Status: DC | PRN
  Administered 2022-09-29: 3 mL
  Administered 2022-09-29: 10 mL

## 2022-09-29 MED ORDER — ESMOLOL HCL 100 MG/10ML IV SOLN
INTRAVENOUS | Status: DC | PRN
  Administered 2022-09-29 (×3): 20 mg via INTRAVENOUS
  Administered 2022-09-29 (×2): 40 mg via INTRAVENOUS

## 2022-09-29 MED ORDER — FENTANYL CITRATE (PF) 250 MCG/5ML IJ SOLN
INTRAMUSCULAR | Status: AC
  Filled 2022-09-29: qty 5

## 2022-09-29 MED ORDER — PROPOFOL 10 MG/ML IV BOLUS
INTRAVENOUS | Status: DC | PRN
  Administered 2022-09-29: 20 mg via INTRAVENOUS
  Administered 2022-09-29: 180 mg via INTRAVENOUS

## 2022-09-29 MED ORDER — CHLORHEXIDINE GLUCONATE CLOTH 2 % EX PADS: 6.0000 | MEDICATED_PAD | Freq: Once | CUTANEOUS | Status: DC

## 2022-09-29 MED ORDER — METOPROLOL TARTRATE 5 MG/5ML IV SOLN
INTRAVENOUS | Status: AC
  Filled 2022-09-29: qty 5

## 2022-09-29 MED ORDER — METFORMIN HCL 500 MG PO TABS
1000.0000 mg | ORAL_TABLET | Freq: Two times a day (BID) | ORAL | Status: DC
  Administered 2022-09-29 – 2022-09-30 (×2): 1000 mg via ORAL
  Filled 2022-09-29 (×2): qty 2

## 2022-09-29 MED ORDER — INSULIN ASPART 100 UNIT/ML IJ SOLN
0.0000 [IU] | Freq: Three times a day (TID) | INTRAMUSCULAR | Status: DC
  Administered 2022-09-30: 5 [IU] via SUBCUTANEOUS

## 2022-09-29 MED ORDER — HYDROCODONE-ACETAMINOPHEN 5-325 MG PO TABS
1.0000 | ORAL_TABLET | ORAL | Status: DC | PRN
  Administered 2022-09-29 – 2022-09-30 (×4): 2 via ORAL
  Filled 2022-09-29 (×2): qty 2
  Filled 2022-09-29 (×2): qty 1
  Filled 2022-09-29: qty 2

## 2022-09-29 MED ORDER — MORPHINE SULFATE (PF) 2 MG/ML IV SOLN: 2.0000 mg | INTRAVENOUS | Status: DC | PRN

## 2022-09-29 MED ORDER — LIDOCAINE 2% (20 MG/ML) 5 ML SYRINGE
INTRAMUSCULAR | Status: DC | PRN
  Administered 2022-09-29: 100 mg via INTRAVENOUS

## 2022-09-29 MED ORDER — POTASSIUM CHLORIDE IN NACL 20-0.9 MEQ/L-% IV SOLN
INTRAVENOUS | Status: DC
  Filled 2022-09-29: qty 1000

## 2022-09-29 MED ORDER — ONDANSETRON HCL 4 MG/2ML IJ SOLN: 4.0000 mg | Freq: Four times a day (QID) | INTRAMUSCULAR | Status: DC | PRN

## 2022-09-29 MED ORDER — ONDANSETRON HCL 4 MG/2ML IJ SOLN
INTRAMUSCULAR | Status: DC | PRN
  Administered 2022-09-29: 4 mg via INTRAVENOUS

## 2022-09-29 MED ORDER — PHENYLEPHRINE 80 MCG/ML (10ML) SYRINGE FOR IV PUSH (FOR BLOOD PRESSURE SUPPORT)
PREFILLED_SYRINGE | INTRAVENOUS | Status: AC
  Filled 2022-09-29: qty 10

## 2022-09-29 MED ORDER — ACETAMINOPHEN 325 MG PO TABS: 650.0000 mg | ORAL_TABLET | ORAL | Status: DC | PRN

## 2022-09-29 MED ORDER — ORAL CARE MOUTH RINSE: 15.0000 mL | Freq: Once | OROMUCOSAL | Status: AC

## 2022-09-29 MED ORDER — SODIUM CHLORIDE 0.9 % IV SOLN: 250.0000 mL | INTRAVENOUS | Status: DC

## 2022-09-29 MED ORDER — ACETAMINOPHEN 500 MG PO TABS
1000.0000 mg | ORAL_TABLET | ORAL | Status: DC
  Filled 2022-09-29: qty 2

## 2022-09-29 MED ORDER — METHOCARBAMOL 500 MG PO TABS
500.0000 mg | ORAL_TABLET | Freq: Four times a day (QID) | ORAL | Status: DC | PRN
  Administered 2022-09-29 – 2022-09-30 (×3): 500 mg via ORAL
  Filled 2022-09-29 (×3): qty 1

## 2022-09-29 MED ORDER — ESMOLOL HCL 100 MG/10ML IV SOLN
INTRAVENOUS | Status: AC
  Filled 2022-09-29: qty 10

## 2022-09-29 MED ORDER — SPIRONOLACTONE 12.5 MG HALF TABLET
12.5000 mg | ORAL_TABLET | Freq: Every day | ORAL | Status: DC
  Administered 2022-09-29 – 2022-09-30 (×2): 12.5 mg via ORAL
  Filled 2022-09-29 (×2): qty 1

## 2022-09-29 MED ORDER — FUROSEMIDE 20 MG PO TABS: 20.0000 mg | ORAL_TABLET | ORAL | Status: DC | PRN

## 2022-09-29 MED ORDER — SODIUM CHLORIDE 0.9 % IV SOLN: INTRAVENOUS | Status: DC | PRN

## 2022-09-29 MED ORDER — SODIUM CHLORIDE 0.9% FLUSH: 3.0000 mL | INTRAVENOUS | Status: DC | PRN

## 2022-09-29 MED ORDER — FENTANYL CITRATE (PF) 250 MCG/5ML IJ SOLN
INTRAMUSCULAR | Status: DC | PRN
  Administered 2022-09-29: 100 ug via INTRAVENOUS
  Administered 2022-09-29 (×3): 50 ug via INTRAVENOUS

## 2022-09-29 MED ORDER — SUGAMMADEX SODIUM 200 MG/2ML IV SOLN
INTRAVENOUS | Status: DC | PRN
  Administered 2022-09-29: 200 mg via INTRAVENOUS

## 2022-09-29 MED ORDER — METHOCARBAMOL 1000 MG/10ML IJ SOLN: 500.0000 mg | Freq: Four times a day (QID) | INTRAVENOUS | Status: DC | PRN

## 2022-09-29 MED ORDER — ALBUTEROL SULFATE HFA 108 (90 BASE) MCG/ACT IN AERS
INHALATION_SPRAY | RESPIRATORY_TRACT | Status: AC
  Filled 2022-09-29: qty 6.7

## 2022-09-29 MED ORDER — TAMSULOSIN HCL 0.4 MG PO CAPS
0.4000 mg | ORAL_CAPSULE | Freq: Every day | ORAL | Status: DC
  Administered 2022-09-30: 0.4 mg via ORAL
  Filled 2022-09-29: qty 1

## 2022-09-29 MED ORDER — METOPROLOL SUCCINATE ER 50 MG PO TB24
50.0000 mg | ORAL_TABLET | Freq: Two times a day (BID) | ORAL | Status: DC
  Administered 2022-09-29 – 2022-09-30 (×2): 50 mg via ORAL
  Filled 2022-09-29 (×2): qty 1

## 2022-09-29 MED ORDER — SODIUM CHLORIDE 0.9% FLUSH
3.0000 mL | Freq: Two times a day (BID) | INTRAVENOUS | Status: DC
  Administered 2022-09-29: 3 mL via INTRAVENOUS

## 2022-09-29 MED ORDER — THROMBIN 20000 UNITS EX SOLR
CUTANEOUS | Status: AC
  Filled 2022-09-29: qty 20000

## 2022-09-29 MED ORDER — PHENOL 1.4 % MT LIQD: 1.0000 | OROMUCOSAL | Status: DC | PRN

## 2022-09-29 MED ORDER — ONDANSETRON HCL 4 MG PO TABS: 4.0000 mg | ORAL_TABLET | Freq: Four times a day (QID) | ORAL | Status: DC | PRN

## 2022-09-29 MED ORDER — MEMANTINE HCL 10 MG PO TABS
10.0000 mg | ORAL_TABLET | Freq: Two times a day (BID) | ORAL | Status: DC
  Administered 2022-09-29 – 2022-09-30 (×2): 10 mg via ORAL
  Filled 2022-09-29 (×2): qty 1

## 2022-09-29 MED ORDER — SACUBITRIL-VALSARTAN 97-103 MG PO TABS
1.0000 | ORAL_TABLET | Freq: Two times a day (BID) | ORAL | Status: DC
  Administered 2022-09-29 – 2022-09-30 (×2): 1 via ORAL
  Filled 2022-09-29 (×2): qty 1

## 2022-09-29 MED ORDER — ALBUTEROL SULFATE HFA 108 (90 BASE) MCG/ACT IN AERS
INHALATION_SPRAY | RESPIRATORY_TRACT | Status: DC | PRN
  Administered 2022-09-29: 2 via RESPIRATORY_TRACT

## 2022-09-29 MED ORDER — CHLORHEXIDINE GLUCONATE 0.12 % MT SOLN
15.0000 mL | Freq: Once | OROMUCOSAL | Status: AC
  Administered 2022-09-29: 15 mL via OROMUCOSAL
  Filled 2022-09-29: qty 15

## 2022-09-29 MED ORDER — ROCURONIUM BROMIDE 10 MG/ML (PF) SYRINGE
PREFILLED_SYRINGE | INTRAVENOUS | Status: DC | PRN
  Administered 2022-09-29 (×2): 20 mg via INTRAVENOUS
  Administered 2022-09-29: 80 mg via INTRAVENOUS

## 2022-09-29 MED ORDER — FINASTERIDE 5 MG PO TABS
5.0000 mg | ORAL_TABLET | Freq: Every day | ORAL | Status: DC
  Administered 2022-09-30: 5 mg via ORAL
  Filled 2022-09-29: qty 1

## 2022-09-29 MED ORDER — DULOXETINE HCL 30 MG PO CPEP
90.0000 mg | ORAL_CAPSULE | Freq: Every day | ORAL | Status: DC
  Administered 2022-09-30: 90 mg via ORAL
  Filled 2022-09-29: qty 3

## 2022-09-29 MED ORDER — METOPROLOL TARTRATE 5 MG/5ML IV SOLN
INTRAVENOUS | Status: DC | PRN
  Administered 2022-09-29: 2 mg via INTRAVENOUS
  Administered 2022-09-29: 3 mg via INTRAVENOUS

## 2022-09-29 MED ORDER — THROMBIN 5000 UNITS EX SOLR
OROMUCOSAL | Status: DC | PRN
  Administered 2022-09-29: 5 mL via TOPICAL

## 2022-09-29 MED ORDER — MENTHOL 3 MG MT LOZG: 1.0000 | LOZENGE | OROMUCOSAL | Status: DC | PRN

## 2022-09-29 MED ORDER — ACETAMINOPHEN 650 MG RE SUPP: 650.0000 mg | RECTAL | Status: DC | PRN

## 2022-09-29 MED ORDER — LACTATED RINGERS IV SOLN: INTRAVENOUS | Status: DC

## 2022-09-29 MED ORDER — CEFAZOLIN SODIUM-DEXTROSE 2-4 GM/100ML-% IV SOLN
2.0000 g | INTRAVENOUS | Status: AC
  Administered 2022-09-29: 2 g via INTRAVENOUS
  Filled 2022-09-29: qty 100

## 2022-09-29 MED ORDER — THROMBIN 20000 UNITS EX SOLR
CUTANEOUS | Status: DC | PRN
  Administered 2022-09-29: 20 mL via TOPICAL

## 2022-09-29 MED ORDER — HYDROCODONE-ACETAMINOPHEN 5-325 MG PO TABS
1.0000 | ORAL_TABLET | ORAL | Status: DC | PRN
  Administered 2022-09-29: 1 via ORAL
  Filled 2022-09-29: qty 1

## 2022-09-29 MED ORDER — SENNA 8.6 MG PO TABS
1.0000 | ORAL_TABLET | Freq: Two times a day (BID) | ORAL | Status: DC
  Administered 2022-09-29 – 2022-09-30 (×2): 8.6 mg via ORAL
  Filled 2022-09-29 (×2): qty 1

## 2022-09-29 MED ORDER — DEXAMETHASONE SODIUM PHOSPHATE 10 MG/ML IJ SOLN
INTRAMUSCULAR | Status: DC | PRN
  Administered 2022-09-29: 5 mg via INTRAVENOUS

## 2022-09-29 MED ORDER — CEFAZOLIN SODIUM-DEXTROSE 2-4 GM/100ML-% IV SOLN
2.0000 g | Freq: Three times a day (TID) | INTRAVENOUS | Status: AC
  Administered 2022-09-29 – 2022-09-30 (×2): 2 g via INTRAVENOUS
  Filled 2022-09-29 (×2): qty 100

## 2022-09-29 MED ORDER — BUPIVACAINE HCL (PF) 0.25 % IJ SOLN
INTRAMUSCULAR | Status: AC
  Filled 2022-09-29: qty 30

## 2022-09-29 MED ORDER — INSULIN ASPART 100 UNIT/ML IJ SOLN: 0.0000 [IU] | INTRAMUSCULAR | Status: DC | PRN

## 2022-09-29 MED ORDER — 0.9 % SODIUM CHLORIDE (POUR BTL) OPTIME
TOPICAL | Status: DC | PRN
  Administered 2022-09-29: 1000 mL

## 2022-09-29 MED ORDER — GABAPENTIN 300 MG PO CAPS
300.0000 mg | ORAL_CAPSULE | ORAL | Status: AC
  Administered 2022-09-29: 300 mg via ORAL
  Filled 2022-09-29: qty 1

## 2022-09-29 MED ORDER — PHENYLEPHRINE HCL-NACL 20-0.9 MG/250ML-% IV SOLN
INTRAVENOUS | Status: DC | PRN
  Administered 2022-09-29: 15 ug/min via INTRAVENOUS

## 2022-09-29 SURGICAL SUPPLY — 69 items
ADH SKN CLS APL DERMABOND .7 (GAUZE/BANDAGES/DRESSINGS) ×1
APL SKNCLS STERI-STRIP NONHPOA (GAUZE/BANDAGES/DRESSINGS) ×1
BAG COUNTER SPONGE SURGICOUNT (BAG) ×1 IMPLANT
BAG SPNG CNTER NS LX DISP (BAG) ×1
BASKET BONE COLLECTION (BASKET) ×1 IMPLANT
BENZOIN TINCTURE PRP APPL 2/3 (GAUZE/BANDAGES/DRESSINGS) ×1 IMPLANT
BLADE BONE MILL MEDIUM (MISCELLANEOUS) ×1 IMPLANT
BLADE CLIPPER SURG (BLADE) IMPLANT
BONE FIBERS PLIAFX 5ML (Bone Implant) ×1 IMPLANT
BUR CARBIDE MATCH 3.0 (BURR) ×1 IMPLANT
CANISTER SUCT 3000ML PPV (MISCELLANEOUS) ×1 IMPLANT
CLSR STERI-STRIP ANTIMIC 1/2X4 (GAUZE/BANDAGES/DRESSINGS) IMPLANT
CNTNR URN SCR LID CUP LEK RST (MISCELLANEOUS) ×1 IMPLANT
CONT SPEC 4OZ STRL OR WHT (MISCELLANEOUS) ×1
COVER BACK TABLE 60X90IN (DRAPES) ×1 IMPLANT
DERMABOND ADVANCED .7 DNX12 (GAUZE/BANDAGES/DRESSINGS) ×1 IMPLANT
DRAPE C-ARM 42X72 X-RAY (DRAPES) ×2 IMPLANT
DRAPE C-ARMOR (DRAPES) ×1 IMPLANT
DRAPE LAPAROTOMY 100X72X124 (DRAPES) ×1 IMPLANT
DRAPE SURG 17X23 STRL (DRAPES) ×1 IMPLANT
DRSG OPSITE POSTOP 4X6 (GAUZE/BANDAGES/DRESSINGS) IMPLANT
DURAPREP 26ML APPLICATOR (WOUND CARE) ×1 IMPLANT
ELECT REM PT RETURN 9FT ADLT (ELECTROSURGICAL) ×1
ELECTRODE REM PT RTRN 9FT ADLT (ELECTROSURGICAL) ×1 IMPLANT
EVACUATOR 1/8 PVC DRAIN (DRAIN) ×1 IMPLANT
GAUZE 4X4 16PLY ~~LOC~~+RFID DBL (SPONGE) IMPLANT
GLOVE BIO SURGEON STRL SZ7 (GLOVE) IMPLANT
GLOVE BIO SURGEON STRL SZ8 (GLOVE) ×2 IMPLANT
GLOVE BIOGEL PI IND STRL 7.0 (GLOVE) IMPLANT
GOWN STRL REUS W/ TWL LRG LVL3 (GOWN DISPOSABLE) IMPLANT
GOWN STRL REUS W/ TWL XL LVL3 (GOWN DISPOSABLE) ×2 IMPLANT
GOWN STRL REUS W/TWL 2XL LVL3 (GOWN DISPOSABLE) IMPLANT
GOWN STRL REUS W/TWL LRG LVL3 (GOWN DISPOSABLE)
GOWN STRL REUS W/TWL XL LVL3 (GOWN DISPOSABLE) ×2
GRAFT BNE FBR PLIAFX PRIME 5 (Bone Implant) IMPLANT
HEMOSTAT POWDER KIT SURGIFOAM (HEMOSTASIS) ×1 IMPLANT
KIT BASIN OR (CUSTOM PROCEDURE TRAY) ×1 IMPLANT
KIT GRAFTMAG DEL NEURO DISP (NEUROSURGERY SUPPLIES) IMPLANT
KIT POSITION SURG JACKSON T1 (MISCELLANEOUS) ×1 IMPLANT
KIT TURNOVER KIT B (KITS) ×1 IMPLANT
MILL BONE PREP (MISCELLANEOUS) ×1 IMPLANT
NDL HYPO 25X1 1.5 SAFETY (NEEDLE) ×1 IMPLANT
NEEDLE HYPO 25X1 1.5 SAFETY (NEEDLE) ×1 IMPLANT
NS IRRIG 1000ML POUR BTL (IV SOLUTION) ×1 IMPLANT
PACK LAMINECTOMY NEURO (CUSTOM PROCEDURE TRAY) ×1 IMPLANT
PAD ARMBOARD 7.5X6 YLW CONV (MISCELLANEOUS) ×3 IMPLANT
PUTTY BONE 2.5CC (Putty) IMPLANT
ROD LORD LIPPED TI 5.5X35 (Rod) IMPLANT
ROD LORD LIPPED TI 5.5X40 (Rod) IMPLANT
SCREW CORT SHANK MOD 6.5X40 (Screw) IMPLANT
SCREW KODIAK 6.5X40 (Screw) IMPLANT
SCREW POLYAXIAL TULIP (Screw) IMPLANT
SET SCREW (Screw) ×4 IMPLANT
SET SCREW SPNE (Screw) IMPLANT
SOL ELECTROSURG ANTI STICK (MISCELLANEOUS) ×1
SOLUTION ELECTROSURG ANTI STCK (MISCELLANEOUS) ×1 IMPLANT
SOLUTION IRRIG SURGIPHOR (IV SOLUTION) ×1 IMPLANT
SPACER IDENTITI 9X8X25 6D (Spacer) IMPLANT
SPONGE SURGIFOAM ABS GEL 100 (HEMOSTASIS) ×1 IMPLANT
SPONGE T-LAP 4X18 ~~LOC~~+RFID (SPONGE) IMPLANT
STRIP CLOSURE SKIN 1/2X4 (GAUZE/BANDAGES/DRESSINGS) ×2 IMPLANT
SUT VIC AB 0 CT1 18XCR BRD8 (SUTURE) ×1 IMPLANT
SUT VIC AB 0 CT1 8-18 (SUTURE) ×1
SUT VIC AB 2-0 CP2 18 (SUTURE) ×1 IMPLANT
SUT VIC AB 3-0 SH 8-18 (SUTURE) ×2 IMPLANT
TOWEL GREEN STERILE (TOWEL DISPOSABLE) ×1 IMPLANT
TOWEL GREEN STERILE FF (TOWEL DISPOSABLE) ×1 IMPLANT
TRAY FOLEY MTR SLVR 16FR STAT (SET/KITS/TRAYS/PACK) ×1 IMPLANT
WATER STERILE IRR 1000ML POUR (IV SOLUTION) ×1 IMPLANT

## 2022-09-29 NOTE — Progress Notes (Signed)
Patient was placed on CPAP per anesthesia request in the PACU. CPAP is set on 12cm H2O with 10L O2 bled in via FFM. Patient is tolerating CPAP well at this time without any complications. Patient wears CPAP at home per CRNA.

## 2022-09-29 NOTE — Transfer of Care (Signed)
Immediate Anesthesia Transfer of Care Note  Patient: Donald [name]  Procedure(s) Performed: Lumbar Four-Lumbar Five-Posterior Lumbar Interbody Fusion (Back)  Patient Location: PACU  Anesthesia Type:General  Level of Consciousness: drowsy and patient cooperative  Airway & Oxygen Therapy: Patient Spontanous Breathing and placed on CPAP by RT upon arrival to PACU  Post-op Assessment: Report given to RN, Post -op Vital signs reviewed and stable, and Patient moving all extremities X 4  Post vital signs: Reviewed and stable  Last Vitals:  Vitals Value Taken Time  BP 119/84 09/29/22 1600  Temp    Pulse 92 09/29/22 1605  Resp 19 09/29/22 1605  SpO2 94 % 09/29/22 1605  Vitals shown include unvalidated device data.  Last Pain:  Vitals:   09/29/22 1109  TempSrc:   PainSc: 3       Patients Stated Pain Goal: 0 (09/29/22 1109)  Complications: No notable events documented.

## 2022-09-29 NOTE — Anesthesia Procedure Notes (Signed)
Procedure Name: Intubation Date/Time: 09/29/2022 1:14 PM  Performed by: Audie Pinto, CRNAPre-anesthesia Checklist: Patient identified, Emergency Drugs available, Suction available and Patient being monitored Patient Re-evaluated:Patient Re-evaluated prior to induction Oxygen Delivery Method: Circle system utilized Preoxygenation: Pre-oxygenation with 100% oxygen Induction Type: IV induction Ventilation: Mask ventilation without difficulty and Oral airway inserted - appropriate to patient size Laryngoscope Size: Mac and 4 Grade View: Grade I Tube type: Oral Tube size: 7.5 mm Number of attempts: 1 Airway Equipment and Method: Stylet and Oral airway Placement Confirmation: ETT inserted through vocal cords under direct vision, positive ETCO2 and breath sounds checked- equal and bilateral Secured at: 21 cm Tube secured with: Tape Dental Injury: Teeth and Oropharynx as per pre-operative assessment

## 2022-09-29 NOTE — Op Note (Signed)
09/29/2022  3:26 PM  PATIENT:  Donald [name]  72 y.o. male  PRE-OPERATIVE DIAGNOSIS: Listhesis L4-5, degenerative disc disease L4-5, spinal stenosis L4-5, back pain with radiculopathy  POST-OPERATIVE DIAGNOSIS:  same  PROCEDURE:   1. Decompressive lumbar laminectomy, hemi facetectomy and foraminotomies L4-5 requiring more work than would be required for a simple exposure of the disk for PLIF in order to adequately decompress the neural elements and address the spinal stenosis 2. Posterior lumbar interbody fusion L4-5 using porous titanium interbody cages packed with morcellized allograft and autograft  3. Posterior fixation L4-5 using ATEC cortical pedicle screws.  4. Intertransverse arthrodesis L4-5 right using morcellized autograft and allograft.  SURGEON:  Marikay Alar, MD  ASSISTANTS: Verlin Dike, FNP  ANESTHESIA:  General  EBL: 200 ml  Total I/O In: 850 [I.V.:750; IV Piggyback:100] Out: 200 [Blood:200]  BLOOD ADMINISTERED:none  DRAINS: none   INDICATION FOR PROCEDURE: This patient presented with back pain and leg pain. Imaging revealed listhesis L4-5 with degenerative disc disease and spinal stenosis. The patient tried a reasonable attempt at conservative medical measures without relief. I recommended decompression and instrumented fusion to address the stenosis as well as the segmental  instability.  Patient understood the risks, benefits, and alternatives and potential outcomes and wished to proceed.  PROCEDURE DETAILS:  The patient was brought to the operating room. After induction of generalized endotracheal anesthesia the patient was rolled into the prone position on chest rolls and all pressure points were padded. The patient's lumbar region was cleaned and then prepped with DuraPrep and draped in the usual sterile fashion. Anesthesia was injected and then a dorsal midline incision was made and carried down to the lumbosacral fascia. The fascia was opened and the  paraspinous musculature was taken down in a subperiosteal fashion to expose L4-5. A self-retaining retractor was placed. Intraoperative fluoroscopy confirmed my level, and I started with placement of the L4 cortical pedicle screws. The pedicle screw entry zones were identified utilizing surface landmarks and  AP and lateral fluoroscopy. I scored the cortex with the high-speed drill and then used the hand drill to drill an upward and outward direction into the pedicle. I then tapped line to line. I then placed a 6.5 x 40 mm cortical pedicle screw into the pedicles of L4 bilaterally.    I then turned my attention to the decompression and complete lumbar laminectomies, hemi- facetectomies, and foraminotomies were performed at L4-5.  My nurse practitioner was directly involved in the decompression and exposure of the neural elements. the patient had significant spinal stenosis and this required more work than would be required for a simple exposure of the disc for posterior lumbar interbody fusion which would only require a limited laminotomy. Much more generous decompression and generous foraminotomy was undertaken in order to adequately decompress the neural elements and address the patient's leg pain. The yellow ligament was removed to expose the underlying dura and nerve roots, and generous foraminotomies were performed to adequately decompress the neural elements. Both the exiting and traversing nerve roots were decompressed on both sides until a coronary dilator passed easily along the nerve roots. Once the decompression was complete, I turned my attention to the posterior lower lumbar interbody fusion. The epidural venous vasculature was coagulated and cut sharply. Disc space was incised and the initial discectomy was performed with pituitary rongeurs. The disc space was distracted with sequential distractors to a height of 8 mm. We then used a series of scrapers and shavers to prepare the  endplates for fusion.  The midline was prepared with Epstein curettes. Once the complete discectomy was finished, we packed an appropriate sized interbody cage with local autograft and morcellized allograft, gently retracted the nerve root, and tapped the cage into position at L4-5.  The midline between the cages was packed with morselized autograft and allograft.   We then turned our attention to the placement of the lower pedicle screws. The pedicle screw entry zones were identified utilizing surface landmarks and fluoroscopy. I drilled into each pedicle utilizing the hand drill, and tapped each pedicle with the appropriate tap. We palpated with a ball probe to assure no break in the cortex. We then placed 6.5 x 40 mm into the pedicles bilaterally at L5.  My nurse practitioner assisted in placement of the pedicle screws.  We then decorticated the transverse processes and laid a mixture of morcellized autograft and allograft out over these to perform intertransverse arthrodesis at L4-5 on the right. We then placed lordotic rods into the multiaxial screw heads of the pedicle screws and locked these in position with the locking caps and anti-torque device. We then checked our construct with AP and lateral fluoroscopy. Irrigated with copious amounts of 0.5% povidone iodine solution followed by saline solution. Inspected the nerve roots once again to assure adequate decompression, lined to the dura with Gelfoam,  and then we closed the muscle and the fascia with 0 Vicryl. Closed the subcutaneous tissues with 2-0 Vicryl and subcuticular tissues with 3-0 Vicryl. The skin was closed with benzoin and Steri-Strips. Dressing was then applied, the patient was awakened from general anesthesia and transported to the recovery room in stable condition. At the end of the procedure all sponge, needle and instrument counts were correct.   PLAN OF CARE: admit to inpatient  PATIENT DISPOSITION:  PACU - hemodynamically stable.   Delay start of  Pharmacological VTE agent (>24hrs) due to surgical blood loss or risk of bleeding:  yes

## 2022-09-29 NOTE — H&P (Signed)
Subjective: Patient is a 72 y.o. male admitted for back pain and leg pain. Onset of symptoms was several months ago, gradually worsening since that time.  The pain is rated severe, and is located at the across the lower back and radiates to legs. The pain is described as aching and occurs all day. The symptoms have been progressive. Symptoms are exacerbated by exercise, lifting, and standing. 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  Past Medical History:  Diagnosis Date   Allergic rhinitis, seasonal    Asthma    Bronchitis    CHF (congestive heart failure) (HCC)    Chronic kidney disease, stage I    Cognitive impairment    Effects Short Term Memory   collar bone    dislocation left side 05/2014   Degenerative joint disease of knee    bilateral   Diabetes mellitus    Type 2   Dysrhythmia    PVCs   Fatty liver    GERD (gastroesophageal reflux disease)    Headache    Heachaces r/t eyes   Heart murmur    history of   History of acute prostatitis    History of hematuria    History of hiatal hernia    H/O   Hyperlipidemia    Hypertension    IgA nephropathy    OSA on CPAP    Pneumonia    Sinusitis     Past Surgical History:  Procedure Laterality Date   BIOPSY  05/17/2020   Procedure: BIOPSY;  Surgeon: Vida Rigger, MD;  Location: [facility];  Service: Endoscopy;;   CARDIAC CATHETERIZATION  09/29/2011   normal   CATARACT EXTRACTION Bilateral    CERVICAL SPINE SURGERY     COLONOSCOPY WITH PROPOFOL N/A 05/17/2020   Procedure: COLONOSCOPY WITH PROPOFOL;  Surgeon: Vida Rigger, MD;  Location: [facility];  Service: Endoscopy;  Laterality: N/A;   deviated septum     1990s   ESOPHAGOGASTRODUODENOSCOPY (EGD) WITH PROPOFOL N/A 05/17/2020   Procedure: ESOPHAGOGASTRODUODENOSCOPY (EGD) WITH PROPOFOL;  Surgeon: Vida Rigger, MD;  Location: [facility];  Service: Endoscopy;  Laterality: N/A;   HEMOSTASIS CLIP PLACEMENT  05/17/2020   Procedure:  HEMOSTASIS CLIP PLACEMENT;  Surgeon: Vida Rigger, MD;  Location: [facility];  Service: Endoscopy;;   HERNIA REPAIR     LEFT HEART CATHETERIZATION WITH CORONARY ANGIOGRAM N/A 09/29/2011   Procedure: LEFT HEART CATHETERIZATION WITH CORONARY ANGIOGRAM;  Surgeon: Thurmon Fair, MD;  Location: [facility];  Service: Cardiovascular;  Laterality: N/A;   NM MYOVIEW LTD  07/08/2011   mild LV dilatation,mild septal hypokinesia   POLYPECTOMY  05/17/2020   Procedure: POLYPECTOMY;  Surgeon: Vida Rigger, MD;  Location: [facility];  Service: Endoscopy;;   TONSILLECTOMY     US ECHOCARDIOGRAPHY  09/07/2011   EF 35-45%,mod.ant hypokinesis,boderline LA & RA enlargement,trace MR,TR & PI    Prior to Admission medications   Medication Sig Start Date End Date Taking? Authorizing Provider  acetaminophen (TYLENOL) 325 MG tablet Take 1,000 mg by mouth every 6 (six) hours as needed.   Yes [provider]  Continuous Blood Gluc Sensor (FREESTYLE LIBRE 14 DAY SENSOR) MISC  07/17/20  Yes [provider]  DULoxetine (CYMBALTA) 30 MG capsule Take 90 mg by mouth daily. 05/02/21  Yes [provider]  finasteride (PROSCAR) 5 MG tablet Take 5 mg by mouth daily. 01/28/21  Yes [provider]  HYDROcodone-acetaminophen (NORCO/VICODIN) 5-325 MG tablet Take 1 tablet by mouth every 6 (six)  hours as needed for severe pain. 01/04/21  Yes [provider]  insulin degludec (TRESIBA FLEXTOUCH) 100 UNIT/ML FlexTouch Pen Inject 100 Units into the skin daily. 07/17/20  Yes [provider]  insulin lispro (HUMALOG KWIKPEN) 200 UNIT/ML KwikPen Inject 40-60 Units into the skin as needed. 05/18/22  Yes [provider]  Insulin Pen Needle (UNIFINE PENTIPS PLUS) 32G X 4 MM MISC USE WITH TRESIBA DAILY 01/27/19  Yes [provider]  memantine (NAMENDA) 10 MG tablet Take 1 tablet (10 mg total) by mouth 2 (two) times daily. 08/24/22  Yes Lomax, Amy, NP  metFORMIN (GLUCOPHAGE) 1000  MG tablet Take 1 tablet (1,000 mg total) by mouth 2 (two) times daily with a meal. Restart on 8/22 12/27/18  Yes Mikhail, [place], DO  metoprolol succinate (TOPROL-XL) 50 MG 24 hr tablet Take 50 mg by mouth 2 (two) times daily. Take with or immediately following a meal.   Yes [provider]  pantoprazole (PROTONIX) 40 MG tablet Take 1 tablet (40 mg total) by mouth 2 (two) times daily. Patient taking differently: Take 40 mg by mouth daily. 12/28/21  Yes Omar Person, MD  rosuvastatin (CRESTOR) 20 MG tablet TAKE ONE TABLET BY MOUTH ONE TIME DAILY 05/09/22  Yes Croitoru, [name], MD  sacubitril-valsartan (ENTRESTO) 97-103 MG Take 1 tablet by mouth 2 (two) times daily. 06/08/22  Yes Croitoru, [name], MD  silodosin (RAPAFLO) 8 MG CAPS capsule Take 8 mg by mouth daily. 04/01/21  Yes [provider]  spironolactone (ALDACTONE) 25 MG tablet Take 0.5 tablets (12.5 mg total) by mouth daily. 04/07/22  Yes Croitoru, [name], MD  Ascorbic Acid (VITAMIN C) 500 MG CHEW Chew 500 mg by mouth once. Patient not taking: Reported on 09/27/2022    [provider]  furosemide (LASIX) 20 MG tablet Take 1 tablet (20 mg total) by mouth as needed (Take 20mg  once a day for weight over 200lbs.). 08/21/22   Croitoru, [name], MD  Multiple Vitamins-Minerals (CENTRUM SILVER 50+MEN PO) Take 1 tablet by mouth daily in the afternoon. Patient not taking: Reported on 09/27/2022    [provider]  Omega-3 Fatty Acids (FISH OIL) 1000 MG CPDR Take 2,000 Units by mouth daily. Patient not taking: Reported on 09/27/2022    [provider]   Allergies  Allergen Reactions   Ciprofloxacin     Tendons problem in shoulder Other reaction(s): Arthralgias (intolerance), Myalgias (intolerance)   Empagliflozin Anaphylaxis    Dehydration   Levofloxacin     Other reaction(s): Arthralgias (intolerance), Myalgias (intolerance)   Aricept [Donepezil Hcl] Nausea Only   Bactrim [Sulfamethoxazole-Trimethoprim] Hives    Levofloxacin Other (See Comments)    Muscle tighten up in left and right calf   Misc. Sulfonamide Containing Compounds Rash    Social History   Tobacco Use   Smoking status: Never   Smokeless tobacco: Never   Tobacco comments:    Second hand smoke for 26 years per pt  Substance Use Topics   Alcohol use: No    Alcohol/week: 0.0 standard drinks of alcohol    Family History  Problem Relation Age of Onset   Coronary artery disease Father    Diabetes type II Father    Heart attack Father      Review of Systems  Positive ROS: Active  All other systems have been reviewed and were otherwise negative with the exception of those mentioned in the HPI and as above.  Objective: Vital signs in last 24 hours: Temp:  [97.8 F (  36.6 C)] 97.8 F (36.6 C) (05/24 1104) Pulse Rate:  [80] 80 (05/24 1104) Resp:  [18] 18 (05/24 1104) BP: (116)/(74) 116/74 (05/24 1104) SpO2:  [95 %] 95 % (05/24 1104) Weight:  [90.3 kg] 90.3 kg (05/24 1104)  General Appearance: Alert, cooperative, no distress, appears stated age Head: Normocephalic, without obvious abnormality, atraumatic Eyes: PERRL, conjunctiva/corneas clear, EOM's intact    Neck: Supple, symmetrical, trachea midline Back: Symmetric, no curvature, ROM normal, no CVA tenderness Lungs:  respirations unlabored Heart: Regular rate and rhythm Abdomen: Soft, non-tender Extremities: Extremities normal, atraumatic, no cyanosis or edema Pulses: 2+ and symmetric all extremities Skin: Skin color, texture, turgor normal, no rashes or lesions  NEUROLOGIC:   Mental status: Alert and oriented x4,  no aphasia, good attention span, fund of knowledge, and memory Motor Exam - grossly normal Sensory Exam - grossly normal Reflexes: 1+ Coordination - grossly normal Gait - grossly normal Balance - grossly normal Cranial Nerves: I: smell Not tested  II: visual acuity  OS: nl    OD: nl  II: visual fields Full to confrontation  II: pupils Equal,  round, reactive to light  III,VII: ptosis None  III,IV,VI: extraocular muscles  Full ROM  V: mastication Normal  V: facial light touch sensation  Normal  V,VII: corneal reflex  Present  VII: facial muscle function - upper  Normal  VII: facial muscle function - lower Normal  VIII: hearing Not tested  IX: soft palate elevation  Normal  IX,X: gag reflex Present  XI: trapezius strength  5/5  XI: sternocleidomastoid strength 5/5  XI: neck flexion strength  5/5  XII: tongue strength  Normal    Data Review Lab Results  Component Value Date   WBC 9.0 09/27/2022   HGB 12.8 (L) 09/27/2022   HCT 42.4 09/27/2022   MCV 79.4 (L) 09/27/2022   PLT 283 09/27/2022   Lab Results  Component Value Date   NA 136 09/27/2022   K 3.6 09/27/2022   CL 103 09/27/2022   CO2 23 09/27/2022   BUN 58 (H) 09/27/2022   CREATININE 1.15 09/27/2022   GLUCOSE 73 09/27/2022   Lab Results  Component Value Date   INR 0.9 09/27/2022    Assessment/Plan:  Estimated body mass index is 30.26 kg/m as calculated from the following:   Height as of this encounter: 5\' 8"  (1.727 m).   Weight as of this encounter: 90.3 kg. Patient admitted for PLIF L4-5. Patient has failed a reasonable attempt at conservative therapy.  I explained the condition and procedure to the patient and answered any questions.  Patient wishes to proceed with procedure as planned. Understands risks/ benefits and typical outcomes of procedure.   Tia Alert 09/29/2022 12:09 PM

## 2022-09-30 DIAGNOSIS — G4733 Obstructive sleep apnea (adult) (pediatric): Secondary | ICD-10-CM | POA: Diagnosis not present

## 2022-09-30 DIAGNOSIS — M4316 Spondylolisthesis, lumbar region: Secondary | ICD-10-CM | POA: Diagnosis not present

## 2022-09-30 LAB — GLUCOSE, CAPILLARY: Glucose-Capillary: 225 mg/dL — ABNORMAL HIGH (ref 70–99)

## 2022-09-30 MED ORDER — HYDROCODONE-ACETAMINOPHEN 5-325 MG PO TABS: 1.0000 | ORAL_TABLET | ORAL | 0 refills | Status: DC | PRN

## 2022-09-30 MED ORDER — METHOCARBAMOL 500 MG PO TABS: 500.0000 mg | ORAL_TABLET | Freq: Four times a day (QID) | ORAL | 0 refills | Status: DC | PRN

## 2022-09-30 NOTE — Discharge Instructions (Signed)
Wound Care Keep incision covered and dry until post op day 3. You may remove the Honeycomb dressing on post op day 3. Leave steri-strips on back.  They will fall off by themselves. Do not put any creams, lotions, or ointments on incision. You are fine to shower. Let water run over incision and pat dry.  Activity Walk each and every day, increasing distance each day. No lifting greater than 5 lbs.  Avoid excessive back motion. No driving for 2 weeks; may ride as a passenger locally.  Diet Resume your normal diet.   Return to Work Will be discussed at your follow up appointment.  Call Your Doctor If Any of These Occur Redness, drainage, or swelling at the wound.  Temperature greater than 101 degrees. Severe pain not relieved by pain medication. Incision starts to come apart.  Follow Up Appt Call [phone] today for appointment in 2-3 weeks if you don't already have one or for any problems. 

## 2022-09-30 NOTE — Progress Notes (Signed)
Patient alert and oriented, mae's well, voiding adequate amount of urine, swallowing without difficulty, no c/o pain at time of discharge. Patient discharged home with wife. Script and discharged instructions given to patient. Patient and wife stated understanding of instructions given. Patient has an appointment with Dr. Yetta Barre

## 2022-09-30 NOTE — Discharge Summary (Signed)
Physician Discharge Summary     Providing Compassionate, Quality Care - Together   Patient ID: Donald [name] MRN: [id number] DOB/AGE: [birth year] 72 y.o.  Admit date: 09/29/2022 Discharge date: 09/30/2022  Admission Diagnoses: Spondylolisthesis L4-5  Discharge Diagnoses:  Principal Problem:   S/P lumbar fusion   Discharged Condition: good  Hospital Course: Patient underwent an L4-5 PLIF by Dr. Yetta Barre on 09/29/2022. He was admitted to 3C09 following recovery from anesthesia in the PACU. His postoperative course has been uncomplicated. He has worked with both physical and occupational therapies who feel the patient is ready for discharge home. He is ambulating independently with a cane and without difficulty. He is tolerating a normal diet. He is not having any bowel or bladder dysfunction. His pain is well-controlled with oral pain medication. He is ready for discharge home.   Consults: PT/OT/TOC  Significant Diagnostic Studies: radiology: DG Lumbar Spine 2-3 Views  Result Date: 09/29/2022 CLINICAL DATA:  [id number] Elective surgery [id number] EXAM: LUMBAR SPINE - 2-3 VIEW COMPARISON:  MRI lumbar spine 07/05/2022 FINDINGS: Intraoperative fluoroscopic images during L4-L5 lumbar fusion. Fluoroscopy time: 77.8 seconds. Dose: 62.02 mGy. Hardware appears intact. IMPRESSION: Intraoperative fluoroscopic images during L4-L5 lumbar fusion. Fluoroscopy time/dose as above. Electronically Signed   By: Caprice Renshaw M.D.   On: 09/29/2022 16:09   DG C-Arm 1-60 Min-No Report  Result Date: 09/29/2022 Fluoroscopy was utilized by the requesting physician.  No radiographic interpretation.   DG C-Arm 1-60 Min-No Report  Result Date: 09/29/2022 Fluoroscopy was utilized by the requesting physician.  No radiographic interpretation.     Treatments: surgery:  1. Decompressive lumbar laminectomy, hemi facetectomy and foraminotomies L4-5 requiring more work than would be required for a simple exposure of the disk  for PLIF in order to adequately decompress the neural elements and address the spinal stenosis 2. Posterior lumbar interbody fusion L4-5 using porous titanium interbody cages packed with morcellized allograft and autograft  3. Posterior fixation L4-5 using ATEC cortical pedicle screws.  4. Intertransverse arthrodesis L4-5 right using morcellized autograft and allograft.  Discharge Exam: Blood pressure 114/64, pulse 98, temperature 99.5 F (37.5 C), temperature source Oral, resp. rate 20, height 5\' 8"  (1.727 m), weight 90.3 kg, SpO2 91 %.  Alert and oriented x 4 PERRLA CN II-XII grossly intact MAE, Strength and sensation intact Incision is covered with Honeycomb dressing and Steri Strips; Dressing is clean, dry, and intact   Disposition: Discharge disposition: 01-Home or Self Care       Discharge Instructions     Call MD for:  difficulty breathing, headache or visual disturbances   Complete by: As directed    Call MD for:  hives   Complete by: As directed    Call MD for:  persistant nausea and vomiting   Complete by: As directed    Call MD for:  redness, tenderness, or signs of infection (pain, swelling, redness, odor or green/yellow discharge around incision site)   Complete by: As directed    Call MD for:  severe uncontrolled pain   Complete by: As directed    Call MD for:  temperature >100.4   Complete by: As directed    Diet - low sodium heart healthy   Complete by: As directed    If the dressing is still on your incision site when you go home, remove it on the third day after your surgery date. Remove dressing if it begins to fall off, or if it is dirty or damaged before the third day.  Complete by: As directed    Increase activity slowly   Complete by: As directed       Allergies as of 09/30/2022       Reactions   Ciprofloxacin    Tendons problem in shoulder Other reaction(s): Arthralgias (intolerance), Myalgias (intolerance)   Empagliflozin Anaphylaxis    Dehydration   Levofloxacin    Other reaction(s): Arthralgias (intolerance), Myalgias (intolerance)   Aricept [donepezil Hcl] Nausea Only   Bactrim [sulfamethoxazole-trimethoprim] Hives   Levofloxacin Other (See Comments)   Muscle tighten up in left and right calf   Misc. Sulfonamide Containing Compounds Rash        Medication List     STOP taking these medications    CENTRUM SILVER 50+MEN PO   Fish Oil 1000 MG Cpdr       TAKE these medications    acetaminophen 325 MG tablet Commonly known as: TYLENOL Take 1,000 mg by mouth every 6 (six) hours as needed.   DULoxetine 30 MG capsule Commonly known as: CYMBALTA Take 90 mg by mouth daily.   Entresto 97-103 MG Generic drug: sacubitril-valsartan Take 1 tablet by mouth 2 (two) times daily.   finasteride 5 MG tablet Commonly known as: PROSCAR Take 5 mg by mouth daily.   FreeStyle Libre 14 Day Sensor Misc   furosemide 20 MG tablet Commonly known as: LASIX Take 1 tablet (20 mg total) by mouth as needed (Take 20mg  once a day for weight over 200lbs.).   HumaLOG KwikPen 200 UNIT/ML KwikPen Generic drug: insulin lispro Inject 40-60 Units into the skin as needed.   HYDROcodone-acetaminophen 5-325 MG tablet Commonly known as: NORCO/VICODIN Take 1-2 tablets by mouth every 4 (four) hours as needed for moderate pain ((score 4 to 6)). What changed:  how much to take when to take this reasons to take this   memantine 10 MG tablet Commonly known as: NAMENDA Take 1 tablet (10 mg total) by mouth 2 (two) times daily.   metFORMIN 1000 MG tablet Commonly known as: GLUCOPHAGE Take 1 tablet (1,000 mg total) by mouth 2 (two) times daily with a meal. Restart on 8/22   methocarbamol 500 MG tablet Commonly known as: ROBAXIN Take 1 tablet (500 mg total) by mouth every 6 (six) hours as needed for muscle spasms.   metoprolol succinate 50 MG 24 hr tablet Commonly known as: TOPROL-XL Take 50 mg by mouth 2 (two) times daily. Take  with or immediately following a meal.   pantoprazole 40 MG tablet Commonly known as: PROTONIX Take 1 tablet (40 mg total) by mouth 2 (two) times daily. What changed: when to take this   rosuvastatin 20 MG tablet Commonly known as: CRESTOR TAKE ONE TABLET BY MOUTH ONE TIME DAILY   silodosin 8 MG Caps capsule Commonly known as: RAPAFLO Take 8 mg by mouth daily.   spironolactone 25 MG tablet Commonly known as: ALDACTONE Take 0.5 tablets (12.5 mg total) by mouth daily.   Evaristo Bury FlexTouch 100 UNIT/ML FlexTouch Pen Generic drug: insulin degludec Inject 100 Units into the skin daily.   Unifine Pentips Plus 32G X 4 MM Misc Generic drug: Insulin Pen Needle USE WITH TRESIBA DAILY   Vitamin C 500 MG Chew Chew 500 mg by mouth once.               Durable Medical Equipment  (From admission, onward)           Start     Ordered   09/29/22 1703  DME Walker rolling  Once       Question:  Patient needs a walker to treat with the following condition  Answer:  S/P lumbar fusion   09/29/22 1702   09/29/22 1703  DME 3 n 1  Once        09/29/22 1702              Discharge Care Instructions  (From admission, onward)           Start     Ordered   09/30/22 0000  If the dressing is still on your incision site when you go home, remove it on the third day after your surgery date. Remove dressing if it begins to fall off, or if it is dirty or damaged before the third day.        09/30/22 1610            Follow-up Information     Tia Alert, MD. Go on 10/12/2022.   Specialty: Neurosurgery Why: First post op appointment is on 10/12/2022 at 3:15 PM. Contact information: [address] [place] [address] [phone]                 Signed: Val Eagle, DNP, AGNP-C Nurse Practitioner  [facility] [address], [place], [place] [address] P: [phone]    F: [phone]  09/30/2022,  9:39 AM

## 2022-09-30 NOTE — Care Management (Signed)
Patient with order to DC to home today. Unit staff to provide DME needed for home.   No HH needs identified Patient will have family/ friends provide transportation home. No other TOC needs identified for DC 

## 2022-09-30 NOTE — Evaluation (Signed)
Physical Therapy Evaluation  Patient Details Name: Donald [name] MRN: [id number] DOB: 1951/05/03 Today's Date: 09/30/2022  History of Present Illness  Pt is a 72 y/o male who presents s/p L4-L5 PLIF on 09/29/2022. PMH significant for STM deficit, CHF, CKD I, L clavicle dislocation 2016, DM, HTN, cervical surgery.   Clinical Impression  Pt admitted with above diagnosis. At the time of PT eval, pt was able to demonstrate transfers and ambulation with gross min guard assist to supervision for safety and [facility] for support. Pt was educated on precautions, brace application/wearing schedule, appropriate activity progression, and car transfer. Pt currently with functional limitations due to the deficits listed below (see PT Problem List). Pt will benefit from skilled PT to increase their independence and safety with mobility to allow discharge to the venue listed below.         Recommendations for follow up therapy are one component of a multi-disciplinary discharge planning process, led by the attending physician.  Recommendations may be updated based on patient status, additional functional criteria and insurance authorization.  Follow Up Recommendations       Assistance Recommended at Discharge PRN  Patient can return home with the following  A little help with walking and/or transfers;A little help with bathing/dressing/bathroom;Assistance with cooking/housework;Assist for transportation;Help with stairs or ramp for entrance    Equipment Recommendations BSC/3in1  Recommendations for Other Services       Functional Status Assessment Patient has had a recent decline in their functional status and demonstrates the ability to make significant improvements in function in a reasonable and predictable amount of time.     Precautions / Restrictions Precautions Precautions: Fall;Back Precaution Booklet Issued: Yes (comment) Precaution Comments: Reviewed handout briefly, pt was cued for  precautions during functional mobility. Required Braces or Orthoses: Spinal Brace Spinal Brace: Lumbar corset;Applied in sitting position Restrictions Weight Bearing Restrictions: No      Mobility  Bed Mobility               General bed mobility comments: Pt was received sitting up EOB    Transfers Overall transfer level: Needs assistance Equipment used: Straight cane Transfers: Sit to/from Stand Sit to Stand: Supervision           General transfer comment: VC's for hand placement on seated surface for safety and improved posture with transfers.    Ambulation/Gait Ambulation/Gait assistance: Min guard Gait Distance (Feet): 300 Feet Assistive device: Straight cane Gait Pattern/deviations: Step-through pattern, Decreased stride length, Trunk flexed Gait velocity: Decreased Gait velocity interpretation: 1.31 - 2.62 ft/sec, indicative of limited community ambulator   General Gait Details: Pt appears unsteady and guarded with short step/stride length and low floor clearance. Pt utilizing SPC but reports he "doesn't need it". Prefer pt continues to use [facility] for now to decrease risk for falls.  Stairs            Wheelchair Mobility    Modified Rankin (Stroke Patients Only)       Balance Overall balance assessment: Needs assistance Sitting-balance support: Feet supported, No upper extremity supported Sitting balance-Leahy Scale: Fair     Standing balance support: Single extremity supported, During functional activity Standing balance-Leahy Scale: Poor (approaching fair)                               Pertinent Vitals/Pain Pain Assessment Pain Assessment: Faces Faces Pain Scale: Hurts a little bit Pain Location: incision site Pain Descriptors /  Indicators: Operative site guarding Pain Intervention(s): Limited activity within patient's tolerance, Monitored during session, Repositioned    Home Living Family/patient expects to be discharged  to:: Private residence Living Arrangements: Spouse/significant other Available Help at Discharge: Family;Available 24 hours/day Type of Home: House (Townhome) Home Access: Level entry       Home Layout: One level Home Equipment: Cane - single point      Prior Function Prior Level of Function : Independent/Modified Independent             Mobility Comments: Reports difficulty entering shower but he has been able to complete without assistance. ADLs Comments: Pt reports wife has been clipping toenails for him as he cannot maintain trunk flexion to complete     Hand Dominance        Extremity/Trunk Assessment   Upper Extremity Assessment Upper Extremity Assessment: Defer to OT evaluation    Lower Extremity Assessment Lower Extremity Assessment: Generalized weakness (Mild; consistent with pre-op diagnosis)    Cervical / Trunk Assessment Cervical / Trunk Assessment: Back Surgery  Communication   Communication: No difficulties  Cognition Arousal/Alertness: Awake/alert Behavior During Therapy: WFL for tasks assessed/performed Overall Cognitive Status: Impaired/Different from baseline Area of Impairment: Attention, Memory, Safety/judgement, Awareness, Problem solving                   Current Attention Level: Selective, Sustained Memory: Decreased short-term memory, Decreased recall of precautions   Safety/Judgement: Decreased awareness of safety, Decreased awareness of deficits Awareness: Emergent Problem Solving: Slow processing, Requires verbal cues General Comments: Pt reports wife goes with him for appointments due to poor memory. Difficult to keep on task, requires redirection due to starting a new conversation in the middle of education or a separate task        General Comments      Exercises     Assessment/Plan    PT Assessment Patient needs continued PT services  PT Problem List Decreased strength;Decreased activity tolerance;Decreased  balance;Decreased mobility;Decreased knowledge of use of DME;Decreased safety awareness;Decreased knowledge of precautions;Pain       PT Treatment Interventions DME instruction;Gait training;Functional mobility training;Therapeutic activities;Therapeutic exercise;Balance training;Patient/family education;Cognitive remediation    PT Goals (Current goals can be found in the Care Plan section)  Acute Rehab PT Goals Patient Stated Goal: Get back to working with personal trainer PT Goal Formulation: With patient Time For Goal Achievement: 10/07/22 Potential to Achieve Goals: Good    Frequency Min 5X/week     Co-evaluation               AM-PAC PT "6 Clicks" Mobility  Outcome Measure Help needed turning from your back to your side while in a flat bed without using bedrails?: A Little Help needed moving from lying on your back to sitting on the side of a flat bed without using bedrails?: A Little Help needed moving to and from a bed to a chair (including a wheelchair)?: A Little Help needed standing up from a chair using your arms (e.g., wheelchair or bedside chair)?: A Little Help needed to walk in hospital room?: A Little Help needed climbing 3-5 steps with a railing? : A Little 6 Click Score: 18    End of Session Equipment Utilized During Treatment: Gait belt;Back brace Activity Tolerance: Patient tolerated treatment well Patient left: in bed;with call bell/phone within reach Nurse Communication: Mobility status PT Visit Diagnosis: Unsteadiness on feet (R26.81);Pain Pain - part of body:  (back)    Time: 1610-9604 PT Time Calculation (  min) (ACUTE ONLY): 22 min   Charges:   PT Evaluation $PT Eval Low Complexity: 1 Low          Conni Slipper, PT, DPT Acute Rehabilitation Services Secure Chat Preferred Office: [phone]   Marylynn Pearson 09/30/2022, 8:48 AM

## 2022-09-30 NOTE — Plan of Care (Signed)
  Problem: Education: Goal: Ability to describe self-care measures that may prevent or decrease complications (Diabetes Survival Skills Education) will improve Outcome: Completed/Met Goal: Individualized Educational Video(s) Outcome: Completed/Met   Problem: Coping: Goal: Ability to adjust to condition or change in health will improve Outcome: Completed/Met   Problem: Fluid Volume: Goal: Ability to maintain a balanced intake and output will improve Outcome: Completed/Met   Problem: Health Behavior/Discharge Planning: Goal: Ability to identify and utilize available resources and services will improve Outcome: Completed/Met Goal: Ability to manage health-related needs will improve Outcome: Completed/Met   Problem: Metabolic: Goal: Ability to maintain appropriate glucose levels will improve Outcome: Completed/Met   Problem: Nutritional: Goal: Maintenance of adequate nutrition will improve Outcome: Completed/Met Goal: Progress toward achieving an optimal weight will improve Outcome: Completed/Met   Problem: Skin Integrity: Goal: Risk for impaired skin integrity will decrease Outcome: Completed/Met   Problem: Tissue Perfusion: Goal: Adequacy of tissue perfusion will improve Outcome: Completed/Met   Problem: Education: Goal: Ability to verbalize activity precautions or restrictions will improve Outcome: Completed/Met Goal: Knowledge of the prescribed therapeutic regimen will improve Outcome: Completed/Met Goal: Understanding of discharge needs will improve Outcome: Completed/Met   Problem: Activity: Goal: Ability to avoid complications of mobility impairment will improve Outcome: Completed/Met Goal: Ability to tolerate increased activity will improve Outcome: Completed/Met Goal: Will remain free from falls Outcome: Completed/Met   Problem: Bowel/Gastric: Goal: Gastrointestinal status for postoperative course will improve Outcome: Completed/Met   Problem: Clinical  Measurements: Goal: Ability to maintain clinical measurements within normal limits will improve Outcome: Completed/Met Goal: Postoperative complications will be avoided or minimized Outcome: Completed/Met Goal: Diagnostic test results will improve Outcome: Completed/Met   Problem: Pain Management: Goal: Pain level will decrease Outcome: Completed/Met   Problem: Skin Integrity: Goal: Will show signs of wound healing Outcome: Completed/Met   Problem: Health Behavior/Discharge Planning: Goal: Identification of resources available to assist in meeting health care needs will improve Outcome: Completed/Met   Problem: Bladder/Genitourinary: Goal: Urinary functional status for postoperative course will improve Outcome: Completed/Met   

## 2022-09-30 NOTE — Anesthesia Postprocedure Evaluation (Signed)
Anesthesia Post Note  Patient: Donald [name]  Procedure(s) Performed: Lumbar Four-Lumbar Five-Posterior Lumbar Interbody Fusion (Back)     Patient location during evaluation: PACU Anesthesia Type: General Level of consciousness: sedated Pain management: pain level controlled Vital Signs Assessment: post-procedure vital signs reviewed and stable Respiratory status: spontaneous breathing and respiratory function stable Cardiovascular status: stable Postop Assessment: no apparent nausea or vomiting Anesthetic complications: no   No notable events documented.               Sylis Ketchum DANIEL

## 2022-09-30 NOTE — Evaluation (Signed)
Occupational Therapy Evaluation Patient Details Name: Donald [name] MRN: [id number] DOB: 1950-09-21 Today's Date: 09/30/2022   History of Present Illness Pt is a 72 y/o male who presents s/p L4-L5 PLIF on 09/29/2022. PMH significant for STM deficit, CHF, CKD I, L clavicle dislocation 2016, DM, HTN, cervical surgery.   Clinical Impression   Prior to this admission, patient living with his wife, independent with ADLs with extra time, and walking with a cane. Currently, patient presenting with minimal back pain, but decreased safety awareness, requiring max cues to redirect to tasks and to attempt dressing from seated position, instead of standing to complete. Patient with known memory impairment at baseline, with wife present at end of session to promote recall for precautions and brace application. Patient able to complete ADLs at min A level. Information also provided with regard to a tub bench at end of session to promote increased safety. OT will sign off, with no further acute needs, and no need for OT follow up.      Recommendations for follow up therapy are one component of a multi-disciplinary discharge planning process, led by the attending physician.  Recommendations may be updated based on patient status, additional functional criteria and insurance authorization.   Assistance Recommended at Discharge Frequent or constant Supervision/Assistance (initially)  Patient can return home with the following A little help with walking and/or transfers;A little help with bathing/dressing/bathroom;Direct supervision/assist for medications management;Direct supervision/assist for financial management;Assist for transportation    Functional Status Assessment  Patient has had a recent decline in their functional status and demonstrates the ability to make significant improvements in function in a reasonable and predictable amount of time.  Equipment Recommendations  None recommended by OT     Recommendations for Other Services       Precautions / Restrictions Precautions Precautions: Fall;Back Precaution Booklet Issued: Yes (comment) Precaution Comments: Reviewed handout briefly, pt was cued for precautions during functional mobility. Required Braces or Orthoses: Spinal Brace Spinal Brace: Lumbar corset;Applied in sitting position Restrictions Weight Bearing Restrictions: No      Mobility Bed Mobility               General bed mobility comments: Pt was received sitting up EOB    Transfers Overall transfer level: Needs assistance Equipment used: Straight cane Transfers: Sit to/from Stand Sit to Stand: Supervision           General transfer comment: VC's for hand placement on seated surface for safety and improved posture with transfers.      Balance Overall balance assessment: Needs assistance Sitting-balance support: Feet supported, No upper extremity supported Sitting balance-Leahy Scale: Fair     Standing balance support: Single extremity supported, During functional activity Standing balance-Leahy Scale: Poor (approaching fair)                             ADL either performed or assessed with clinical judgement   ADL Overall ADL's : Needs assistance/impaired Eating/Feeding: Set up;Sitting   Grooming: Set up;Sitting   Upper Body Bathing: Set up;Sitting   Lower Body Bathing: Minimal assistance;Sit to/from stand;Sitting/lateral leans Lower Body Bathing Details (indicate cue type and reason): education provided with regard to a tub bench Upper Body Dressing : Min guard;Sitting   Lower Body Dressing: Minimal assistance;Sit to/from stand;Sitting/lateral leans Lower Body Dressing Details (indicate cue type and reason): good use of figure four technique, however frequent cues to attempt from seated position Toilet Transfer: Minimal assistance;Ambulation  Toilet Transfer Details (indicate cue type and reason): min A for safety  declining use of the RW Toileting- Clothing Manipulation and Hygiene: Min guard;Sit to/from stand;Sitting/lateral lean       Functional mobility during ADLs: Minimal assistance;Cueing for sequencing;Cueing for safety;Cane General ADL Comments: Patient presenting with minimal back pain, but decreased safety awareness, requiring max cues to redirect to tasks and to attempt dressing from seated position, instead of standing to complete.     Vision Baseline Vision/History: 6 Macular Degeneration Ability to See in Adequate Light: 1 Impaired Patient Visual Report: No change from baseline Vision Assessment?: No apparent visual deficits     Perception     Praxis      Pertinent Vitals/Pain Pain Assessment Pain Assessment: Faces Faces Pain Scale: Hurts a little bit Pain Location: incision site Pain Descriptors / Indicators: Operative site guarding Pain Intervention(s): Limited activity within patient's tolerance, Monitored during session, Repositioned     Hand Dominance Left   Extremity/Trunk Assessment Upper Extremity Assessment Upper Extremity Assessment: Overall WFL for tasks assessed   Lower Extremity Assessment Lower Extremity Assessment: Defer to PT evaluation   Cervical / Trunk Assessment Cervical / Trunk Assessment: Back Surgery   Communication Communication Communication: No difficulties   Cognition Arousal/Alertness: Awake/alert Behavior During Therapy: WFL for tasks assessed/performed Overall Cognitive Status: Impaired/Different from baseline Area of Impairment: Attention, Memory, Safety/judgement, Awareness, Problem solving                   Current Attention Level: Selective, Sustained Memory: Decreased short-term memory, Decreased recall of precautions   Safety/Judgement: Decreased awareness of safety, Decreased awareness of deficits Awareness: Emergent Problem Solving: Slow processing, Requires verbal cues General Comments: Pt reports wife goes with  him for appointments due to poor memory. Difficult to keep on task, requires redirection due to starting a new conversation in the middle of education or a separate task     General Comments  Wife present at end of session education provided with regard to tub bench    Exercises     Shoulder Instructions      Home Living Family/patient expects to be discharged to:: Private residence Living Arrangements: Spouse/significant other Available Help at Discharge: Family;Available 24 hours/day Type of Home: House (Townhome) Home Access: Level entry     Home Layout: One level     Bathroom Shower/Tub: Tub/shower unit         Home Equipment: Cane - single point          Prior Functioning/Environment Prior Level of Function : Independent/Modified Independent             Mobility Comments: Reports difficulty entering shower but he has been able to complete without assistance. ADLs Comments: Pt reports wife has been clipping toenails for him as he cannot maintain trunk flexion to complete        OT Problem List: Decreased strength;Decreased activity tolerance;Impaired balance (sitting and/or standing);Decreased coordination;Decreased cognition;Decreased safety awareness;Decreased knowledge of use of DME or AE;Decreased knowledge of precautions      OT Treatment/Interventions:      OT Goals(Current goals can be found in the care plan section) Acute Rehab OT Goals Patient Stated Goal: to get better OT Goal Formulation: With patient/family Time For Goal Achievement: 10/14/22 Potential to Achieve Goals: Good  OT Frequency:      Co-evaluation              AM-PAC OT "6 Clicks" Daily Activity     Outcome Measure Help  from another person eating meals?: None Help from another person taking care of personal grooming?: None Help from another person toileting, which includes using toliet, bedpan, or urinal?: A Little Help from another person bathing (including washing,  rinsing, drying)?: A Little Help from another person to put on and taking off regular upper body clothing?: None Help from another person to put on and taking off regular lower body clothing?: A Little 6 Click Score: 21   End of Session Equipment Utilized During Treatment: Back brace Gilmer Mor) Nurse Communication: Mobility status  Activity Tolerance: Patient tolerated treatment well Patient left: in bed;with call bell/phone within reach;with family/visitor present (sitting EOB)  OT Visit Diagnosis: Unsteadiness on feet (R26.81);Other abnormalities of gait and mobility (R26.89);Pain                Time: 1610-9604 OT Time Calculation (min): 30 min Charges:  OT General Charges $OT Visit: 1 Visit OT Evaluation $OT Eval Moderate Complexity: 1 Mod OT Treatments $Self Care/Home Management : 8-22 mins  Pollyann Glen E. Katori Wirsing, OTR/L Acute Rehabilitation Services [phone]   Donald [name] 09/30/2022, 9:32 AM

## 2022-10-03 DIAGNOSIS — G4733 Obstructive sleep apnea (adult) (pediatric): Secondary | ICD-10-CM | POA: Diagnosis not present

## 2022-10-03 LAB — HEMOGLOBIN A1C
Hgb A1c MFr Bld: 8.5 % — ABNORMAL HIGH (ref 4.8–5.6)
Mean Plasma Glucose: 197 mg/dL

## 2022-10-18 DIAGNOSIS — N401 Enlarged prostate with lower urinary tract symptoms: Secondary | ICD-10-CM | POA: Diagnosis not present

## 2022-10-18 DIAGNOSIS — N3000 Acute cystitis without hematuria: Secondary | ICD-10-CM | POA: Diagnosis not present

## 2022-10-18 DIAGNOSIS — R3914 Feeling of incomplete bladder emptying: Secondary | ICD-10-CM | POA: Diagnosis not present

## 2022-10-19 DIAGNOSIS — M2560 Stiffness of unspecified joint, not elsewhere classified: Secondary | ICD-10-CM | POA: Diagnosis not present

## 2022-10-19 DIAGNOSIS — M5459 Other low back pain: Secondary | ICD-10-CM | POA: Diagnosis not present

## 2022-10-19 DIAGNOSIS — M6281 Muscle weakness (generalized): Secondary | ICD-10-CM | POA: Diagnosis not present

## 2022-10-24 DIAGNOSIS — M6281 Muscle weakness (generalized): Secondary | ICD-10-CM | POA: Diagnosis not present

## 2022-10-24 DIAGNOSIS — M5459 Other low back pain: Secondary | ICD-10-CM | POA: Diagnosis not present

## 2022-10-24 DIAGNOSIS — M2560 Stiffness of unspecified joint, not elsewhere classified: Secondary | ICD-10-CM | POA: Diagnosis not present

## 2022-10-26 DIAGNOSIS — M2560 Stiffness of unspecified joint, not elsewhere classified: Secondary | ICD-10-CM | POA: Diagnosis not present

## 2022-10-26 DIAGNOSIS — M6281 Muscle weakness (generalized): Secondary | ICD-10-CM | POA: Diagnosis not present

## 2022-10-26 DIAGNOSIS — M5459 Other low back pain: Secondary | ICD-10-CM | POA: Diagnosis not present

## 2022-10-31 DIAGNOSIS — M6281 Muscle weakness (generalized): Secondary | ICD-10-CM | POA: Diagnosis not present

## 2022-10-31 DIAGNOSIS — M5459 Other low back pain: Secondary | ICD-10-CM | POA: Diagnosis not present

## 2022-10-31 DIAGNOSIS — M2560 Stiffness of unspecified joint, not elsewhere classified: Secondary | ICD-10-CM | POA: Diagnosis not present

## 2022-11-02 DIAGNOSIS — M5459 Other low back pain: Secondary | ICD-10-CM | POA: Diagnosis not present

## 2022-11-02 DIAGNOSIS — M2560 Stiffness of unspecified joint, not elsewhere classified: Secondary | ICD-10-CM | POA: Diagnosis not present

## 2022-11-02 DIAGNOSIS — M6281 Muscle weakness (generalized): Secondary | ICD-10-CM | POA: Diagnosis not present

## 2022-11-03 DIAGNOSIS — G4733 Obstructive sleep apnea (adult) (pediatric): Secondary | ICD-10-CM | POA: Diagnosis not present

## 2022-11-07 DIAGNOSIS — M2560 Stiffness of unspecified joint, not elsewhere classified: Secondary | ICD-10-CM | POA: Diagnosis not present

## 2022-11-07 DIAGNOSIS — M5459 Other low back pain: Secondary | ICD-10-CM | POA: Diagnosis not present

## 2022-11-07 DIAGNOSIS — M6281 Muscle weakness (generalized): Secondary | ICD-10-CM | POA: Diagnosis not present

## 2022-11-10 DIAGNOSIS — M2560 Stiffness of unspecified joint, not elsewhere classified: Secondary | ICD-10-CM | POA: Diagnosis not present

## 2022-11-10 DIAGNOSIS — M5459 Other low back pain: Secondary | ICD-10-CM | POA: Diagnosis not present

## 2022-11-10 DIAGNOSIS — M6281 Muscle weakness (generalized): Secondary | ICD-10-CM | POA: Diagnosis not present

## 2022-11-14 DIAGNOSIS — M5416 Radiculopathy, lumbar region: Secondary | ICD-10-CM | POA: Diagnosis not present

## 2022-11-14 DIAGNOSIS — M6281 Muscle weakness (generalized): Secondary | ICD-10-CM | POA: Diagnosis not present

## 2022-11-14 DIAGNOSIS — M5459 Other low back pain: Secondary | ICD-10-CM | POA: Diagnosis not present

## 2022-11-14 DIAGNOSIS — M2560 Stiffness of unspecified joint, not elsewhere classified: Secondary | ICD-10-CM | POA: Diagnosis not present

## 2022-11-17 DIAGNOSIS — I129 Hypertensive chronic kidney disease with stage 1 through stage 4 chronic kidney disease, or unspecified chronic kidney disease: Secondary | ICD-10-CM | POA: Diagnosis not present

## 2022-11-17 DIAGNOSIS — I5042 Chronic combined systolic (congestive) and diastolic (congestive) heart failure: Secondary | ICD-10-CM | POA: Diagnosis not present

## 2022-11-17 DIAGNOSIS — Z794 Long term (current) use of insulin: Secondary | ICD-10-CM | POA: Diagnosis not present

## 2022-11-17 DIAGNOSIS — E785 Hyperlipidemia, unspecified: Secondary | ICD-10-CM | POA: Diagnosis not present

## 2022-11-17 DIAGNOSIS — E118 Type 2 diabetes mellitus with unspecified complications: Secondary | ICD-10-CM | POA: Diagnosis not present

## 2022-11-17 DIAGNOSIS — N182 Chronic kidney disease, stage 2 (mild): Secondary | ICD-10-CM | POA: Diagnosis not present

## 2022-11-28 ENCOUNTER — Telehealth: Payer: Self-pay | Admitting: Cardiovascular Disease

## 2022-11-28 NOTE — Telephone Encounter (Signed)
Do we have BP recording? Is chest squeezing worse when lying down flat in bed? Or when physically active? Increase furosemide to 2 tablets daily please. Has the extra furosemide helped so far?

## 2022-11-28 NOTE — Telephone Encounter (Signed)
Pt c/o of Chest Pain: STAT if active CP, including tightness, pressure, jaw pain, radiating pain to shoulder/upper arm/back, CP unrelieved by Nitro. Symptoms reported of SOB, nausea, vomiting, sweating.  1. Are you having CP right now?   Gentle squeezing in his chest  2. Are you experiencing any other symptoms (ex. SOB, nausea, vomiting, sweating)?   No  3. Is your CP continuous or coming and going?   Coming and going for a couple of weeks  4. Have you taken Nitroglycerin?   NBo   5. How long have you been experiencing CP?  A couple of weeks    6. If NO CP at time of call then end call with telling Pt to call back or call 911 if Chest pain returns prior to return call from triage team.   Wife stated patient is feeling some squeezing in his chest.

## 2022-11-28 NOTE — Telephone Encounter (Signed)
Spoke to the patient wife Diane DPR, for the past couple week  pt experienced intermitted  gentle "squeezing in his chest". Pt does have mild BLE swelling, he did increased his lasix to 1.5 daily. Does not monitor his weight,  elevate his legs at times, and does monitor sodium intake. Explained ED precautions, pt wife stated her husband does not want to go and he is a DNR. Pt  asked his wife to call and give Dr. Royann Shivers an update with his current symptoms. Will forward to MD and nurse for advise.

## 2022-11-29 MED ORDER — FUROSEMIDE 20 MG PO TABS: 30.0000 mg | ORAL_TABLET | Freq: Every day | ORAL | Status: DC

## 2022-11-29 NOTE — Telephone Encounter (Signed)
Spoke to the patient wife Donald [name], [place] patient does not monitor blood pressure, pt experience chest squeezing at random times during little to no activities. Correct with previous note, pt wife stated they were considering increasing his lasix to 1.5 however, waiting for Dr.C recommendation. Pt wife stated they will increase his lasix to 1.5 starting today. Will forward to MD and nurse.

## 2022-11-29 NOTE — Telephone Encounter (Signed)
She states that the pt was taking 1 tablet, and that they moved up to 1.5 tabs today. Do you still want them to increase to 2 or stay at 1.5?  How many days worth of BP and weight readings does Dr C want?   Dr Royann Shivers responded:  Check weight and BP through Monday and let us know readings. Also take 1.5 tabs of lasix. She verbalized understanding.

## 2022-12-03 DIAGNOSIS — G4733 Obstructive sleep apnea (adult) (pediatric): Secondary | ICD-10-CM | POA: Diagnosis not present

## 2022-12-04 DIAGNOSIS — H5213 Myopia, bilateral: Secondary | ICD-10-CM | POA: Diagnosis not present

## 2022-12-04 DIAGNOSIS — E119 Type 2 diabetes mellitus without complications: Secondary | ICD-10-CM | POA: Diagnosis not present

## 2022-12-04 DIAGNOSIS — H35372 Puckering of macula, left eye: Secondary | ICD-10-CM | POA: Diagnosis not present

## 2022-12-04 DIAGNOSIS — H31002 Unspecified chorioretinal scars, left eye: Secondary | ICD-10-CM | POA: Diagnosis not present

## 2022-12-04 DIAGNOSIS — Z961 Presence of intraocular lens: Secondary | ICD-10-CM | POA: Diagnosis not present

## 2022-12-05 ENCOUNTER — Encounter: Payer: Self-pay | Admitting: Cardiovascular Disease

## 2022-12-05 DIAGNOSIS — N183 Chronic kidney disease, stage 3 unspecified: Secondary | ICD-10-CM

## 2022-12-05 DIAGNOSIS — Z79899 Other long term (current) drug therapy: Secondary | ICD-10-CM

## 2022-12-05 NOTE — Telephone Encounter (Signed)
Can't see that there is any correlation between his vital signs and his symptoms.  Blood pressure is definitely not high and is borderline low.  Weight seems to be lower than what we have recorded in the past in the office.  Not sure what is going on, but I am concerned that this is not congestive heart failure.  Please have him get a BMET and proBNP and make him an office appointment.

## 2022-12-06 ENCOUNTER — Other Ambulatory Visit: Payer: Self-pay

## 2022-12-06 DIAGNOSIS — N183 Chronic kidney disease, stage 3 unspecified: Secondary | ICD-10-CM | POA: Diagnosis not present

## 2022-12-06 DIAGNOSIS — Z79899 Other long term (current) drug therapy: Secondary | ICD-10-CM | POA: Diagnosis not present

## 2022-12-07 ENCOUNTER — Telehealth: Payer: Self-pay | Admitting: Emergency Medicine

## 2022-12-07 MED ORDER — FUROSEMIDE 20 MG PO TABS: 20.0000 mg | ORAL_TABLET | ORAL | Status: DC

## 2022-12-07 NOTE — Telephone Encounter (Signed)
[name], [name], MD  Labs actually suggest that he is quite "dry".  The BNP is quite low and kidney function tests are little worse than baseline, more diuretic is not the answer.  Please skip furosemide for a couple of days - see if that makes him feel a little better. Then go back to just 1 x 20 mg furosemide every other day.  Left the information above on the patient's and spouse's (dpr) voicemail. Left call back number. Want to be sure he understands the directions. This information is also available in MyChart.   Updated new dosage on pt's medication list

## 2022-12-13 ENCOUNTER — Ambulatory Visit: Payer: PPO | Attending: Cardiovascular Disease | Admitting: Cardiovascular Disease

## 2022-12-13 ENCOUNTER — Encounter: Payer: Self-pay | Admitting: Cardiovascular Disease

## 2022-12-13 VITALS — BP 112/78 | HR 102 | Ht 68.0 in | Wt 200.6 lb

## 2022-12-13 DIAGNOSIS — I10 Essential (primary) hypertension: Secondary | ICD-10-CM | POA: Diagnosis not present

## 2022-12-13 DIAGNOSIS — I428 Other cardiomyopathies: Secondary | ICD-10-CM

## 2022-12-13 DIAGNOSIS — I2510 Atherosclerotic heart disease of native coronary artery without angina pectoris: Secondary | ICD-10-CM

## 2022-12-13 DIAGNOSIS — Z794 Long term (current) use of insulin: Secondary | ICD-10-CM

## 2022-12-13 DIAGNOSIS — I429 Cardiomyopathy, unspecified: Secondary | ICD-10-CM

## 2022-12-13 DIAGNOSIS — E782 Mixed hyperlipidemia: Secondary | ICD-10-CM | POA: Diagnosis not present

## 2022-12-13 DIAGNOSIS — E119 Type 2 diabetes mellitus without complications: Secondary | ICD-10-CM

## 2022-12-13 DIAGNOSIS — I5042 Chronic combined systolic (congestive) and diastolic (congestive) heart failure: Secondary | ICD-10-CM

## 2022-12-13 MED ORDER — FUROSEMIDE 20 MG PO TABS: 20.0000 mg | ORAL_TABLET | ORAL | 3 refills | Status: DC | PRN

## 2022-12-13 NOTE — Patient Instructions (Addendum)
Medication Instructions:  Your physician has recommended you make the following change in your medication: 1) STOP taking Lasix (furosemide) daily - only take if weight is greater than 200lbs. *If you need a refill on your cardiac medications before your next appointment, please call your pharmacy*  Follow-Up: At [facility], you and your health needs are our priority.  As part of our continuing mission to provide you with exceptional heart care, we have created designated Provider Care Teams.  These Care Teams include your primary Cardiologist (physician) and Advanced Practice Providers (APPs -  Physician Assistants and Nurse Practitioners) who all work together to provide you with the care you need, when you need it.  Your next appointment:   1 year  Provider:   Thurmon Fair, MD

## 2022-12-13 NOTE — Progress Notes (Signed)
Cardiology Office Note:    Date:  12/16/2022   ID:  Donald [name], DOB Mar 02, 1951, MRN [id number]  PCP:  Chilton Greathouse, MD   [facility] Providers Cardiologist:  Thurmon Fair, MD     Referring MD: Chilton Greathouse, MD   Chief Complaint  Patient presents with   Shortness of Breath   Fatigue     History of Present Illness:    Donald [name] is a 72 y.o. male with a hx of nonischemic cardiomyopathy dating back to at least 2013 when he had coronary angiography that showed no evidence of significant obstructive disease.  Most recent LVEF by echo 08/09/2022 was 40%.  Additional medical problems include treated hypertension, type 2 diabetes mellitus, hypercholesterolemia, OSA on CPAP.    He recently had increasing problems with fatigue and shortness of breath, but did not have edema or orthopnea or PND.  Labs performed a week ago showed a low proBNP level at only 46 (despite the fact that he is on Entresto which could make it artifactually high) and then elevated creatinine at 1.3, compared to his baseline around 1.15 BUN of 71 compared to the baseline 45-50.  On our office scale he weighs 200 pounds which is our previous estimated "dry weight", at home he weighs about 5 pounds last.  He is mildly tachycardic today.  His medications also include spironolactone, Entresto.  He is not on SGLT2 inhibitors due to frequent urinary tract infections.  He has a glucose monitor that shows mild hyperglycemia, not often over 180.  His most recent hemoglobin A1c was 8.5%.  In 2013, nuclear stress that showed a left ventricular ejection fraction of 50%, with a normal perfusion pattern and mild left ventricular dilatation.  His echocardiogram also showed an EF of 50-55% with mild diffuse left ventricular hypokinesis and no significant valvular abnormalities.  In 2016, his pulmonary specialist Dr. Marchelle Gearing ordered a cardiopulmonary excise stress test that showed mild-mod reduced functional  capacity without any clear evidence of cardiovascular limitations.  Chronotropic response was normal.  The peak VO2 was mildly reduced at 23.5 mL/kilogram/minute (76% of predicted).  VE/VCO2 slope was normal.echocardiogram performed 03/22/2021 shows further reduction in LVEF which is now down to 35-40%.  Diastolic parameters suggest normal filling pressures.  The systolic PA pressure is normal at 29 mmHg. Once again there are no significant valvular abnormalities.  Despite transition to [facility] since maximum dose), metoprolol succinate and spironolactone LVEF has not really increased much, now 40% on echocardiogram performed April 2023.  He had allergic response to SGLT2 inhibitors and has a history of very frequent urinary tract infections due to ureteral obstruction.  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  He also has some early cognitive deficits especially short-term memory problems and is seeing Dr. Vickey Huger , who also monitors his CPAP for OSA.  He is taking memantine with some benefit.   Past Medical History:  Diagnosis Date   Allergic rhinitis, seasonal    Asthma    Bronchitis    CHF (congestive heart failure) (HCC)    Chronic kidney disease, stage I    Cognitive impairment    Effects Short Term Memory   collar bone    dislocation left side 05/2014   Degenerative joint disease of knee    bilateral   Diabetes mellitus    Type 2   Dysrhythmia    PVCs   Fatty liver  GERD (gastroesophageal reflux disease)    Headache    Heachaces r/t eyes   Heart murmur    history of   History of acute prostatitis    History of hematuria    History of hiatal hernia    H/O   Hyperlipidemia    Hypertension    IgA nephropathy    OSA on CPAP    Pneumonia    Sinusitis     Past Surgical History:  Procedure Laterality Date   BIOPSY  05/17/2020   Procedure: BIOPSY;  Surgeon: Vida Rigger,  MD;  Location: [facility];  Service: Endoscopy;;   CARDIAC CATHETERIZATION  09/29/2011   normal   CATARACT EXTRACTION Bilateral    CERVICAL SPINE SURGERY     COLONOSCOPY WITH PROPOFOL N/A 05/17/2020   Procedure: COLONOSCOPY WITH PROPOFOL;  Surgeon: Vida Rigger, MD;  Location: [facility];  Service: Endoscopy;  Laterality: N/A;   deviated septum     1990s   ESOPHAGOGASTRODUODENOSCOPY (EGD) WITH PROPOFOL N/A 05/17/2020   Procedure: ESOPHAGOGASTRODUODENOSCOPY (EGD) WITH PROPOFOL;  Surgeon: Vida Rigger, MD;  Location: [facility];  Service: Endoscopy;  Laterality: N/A;   HEMOSTASIS CLIP PLACEMENT  05/17/2020   Procedure: HEMOSTASIS CLIP PLACEMENT;  Surgeon: Vida Rigger, MD;  Location: [facility];  Service: Endoscopy;;   HERNIA REPAIR     LEFT HEART CATHETERIZATION WITH CORONARY ANGIOGRAM N/A 09/29/2011   Procedure: LEFT HEART CATHETERIZATION WITH CORONARY ANGIOGRAM;  Surgeon: Thurmon Fair, MD;  Location: [facility];  Service: Cardiovascular;  Laterality: N/A;   NM MYOVIEW LTD  07/08/2011   mild LV dilatation,mild septal hypokinesia   POLYPECTOMY  05/17/2020   Procedure: POLYPECTOMY;  Surgeon: Vida Rigger, MD;  Location: [facility];  Service: Endoscopy;;   TONSILLECTOMY     US ECHOCARDIOGRAPHY  09/07/2011   EF 35-45%,mod.ant hypokinesis,boderline LA & RA enlargement,trace MR,TR & PI    Current Medications: Current Meds  Medication Sig   Continuous Blood Gluc Sensor (FREESTYLE LIBRE 14 DAY SENSOR) MISC    DULoxetine (CYMBALTA) 30 MG capsule Take 90 mg by mouth daily.   finasteride (PROSCAR) 5 MG tablet Take 5 mg by mouth daily.   furosemide (LASIX) 20 MG tablet Take 1 tablet (20 mg total) by mouth as needed. Take 1 tablet (20 mg) daily if weight is greater than 200.   HYDROcodone-acetaminophen (NORCO/VICODIN) 5-325 MG tablet Take 1-2 tablets by mouth every 4 (four) hours as needed for moderate pain ((score 4 to 6)).   insulin degludec (TRESIBA FLEXTOUCH) 100 UNIT/ML FlexTouch  Pen Inject 100 Units into the skin daily.   insulin lispro (HUMALOG KWIKPEN) 200 UNIT/ML KwikPen Inject 40-60 Units into the skin as needed.   Insulin Pen Needle (UNIFINE PENTIPS PLUS) 32G X 4 MM MISC USE WITH TRESIBA DAILY   memantine (NAMENDA) 10 MG tablet Take 1 tablet (10 mg total) by mouth 2 (two) times daily.   metFORMIN (GLUCOPHAGE) 1000 MG tablet Take 1 tablet (1,000 mg total) by mouth 2 (two) times daily with a meal. Restart on 8/22   metoprolol succinate (TOPROL-XL) 50 MG 24 hr tablet Take 50 mg by mouth 2 (two) times daily. Take with or immediately following a meal.   pantoprazole (PROTONIX) 40 MG tablet Take 1 tablet (40 mg total) by mouth 2 (two) times daily. (Patient taking differently: Take 40 mg by mouth daily.)   rosuvastatin (CRESTOR) 20 MG tablet TAKE ONE TABLET BY MOUTH ONE TIME DAILY   sacubitril-valsartan (ENTRESTO) 97-103 MG Take 1 tablet by mouth 2 (two) times  daily.   silodosin (RAPAFLO) 8 MG CAPS capsule Take 8 mg by mouth daily.   spironolactone (ALDACTONE) 25 MG tablet Take 0.5 tablets (12.5 mg total) by mouth daily.   [DISCONTINUED] furosemide (LASIX) 20 MG tablet Take 1 tablet (20 mg total) by mouth every other day.     Allergies:   Ciprofloxacin, Empagliflozin, Levofloxacin, Aricept [donepezil hcl], Bactrim [sulfamethoxazole-trimethoprim], Levofloxacin, and Misc. sulfonamide containing compounds   Social History   Socioeconomic History   Marital status: Married    Spouse name: Diane   Number of children: 1   Years of education: 14   Highest education level: Not on file  Occupational History   Occupation: Teaching laboratory technician: Nurse, children's  Tobacco Use   Smoking status: Never   Smokeless tobacco: Never   Tobacco comments:    Second hand smoke for 26 years per pt  Vaping Use   Vaping status: Never Used  Substance and Sexual Activity   Alcohol use: No    Alcohol/week: 0.0 standard drinks of alcohol   Drug use: No   Sexual activity: Yes   Other Topics Concern   Not on file  Social History Narrative   Patient is married (Diane) and lives at home with his wife.   Patient has one child.   Patient is working full-time.   Patient has a Scientist, research (physical sciences).   Patient is left-handed.   Patient drinks one glass of tea daily, one soda daily.   Social Determinants of Health   Financial Resource Strain: Not on file  Food Insecurity: Not on file  Transportation Needs: Not on file  Physical Activity: Not on file  Stress: Not on file  Social Connections: Not on file     Family History: The patient's family history includes Coronary artery disease in his father; Diabetes type II in his father; Heart attack in his father.  ROS:   Please see the history of present illness.     All other systems reviewed and are negative.  EKGs/Labs/Other Studies Reviewed:    The following studies were reviewed today: Notes/labs/imaging studies from ED visit 03/27/2021  Echocardiogram 08/09/2022:   1. Left ventricular ejection fraction, by estimation, is 40%. The left  ventricle has moderately decreased function. The left ventricle  demonstrates global hypokinesis. The left ventricular internal cavity size  was mildly dilated. Left ventricular  diastolic parameters are consistent with Grade I diastolic dysfunction  (impaired relaxation). There is abnromal septal motion.   2. Right ventricular systolic function is normal. The right ventricular  size is mildly enlarged. Tricuspid regurgitation signal is inadequate for  assessing PA pressure.   3. The mitral valve is grossly normal. Trivial mitral valve  regurgitation. No evidence of mitral stenosis.   4. The aortic valve is tricuspid. There is mild thickening of the aortic  valve. Aortic valve regurgitation is not visualized. Aortic valve  sclerosis/calcification is present, without any evidence of aortic  stenosis.   5. The inferior vena cava is normal in size with greater than 50%   respiratory variability, suggesting right atrial pressure of 3 mmHg.   Comparison(s): No significant change from prior study. Prior images  reviewed side by side.    Event monitor 05/09/2022:    Rhythm is normal sinus rhythm with normal circadian variation.   There are rare isolated premature atrial contractions and a single 4 beat run of nonsustained atrial tachycardia.  Atrial fibrillation was not seen.   There were rare premature ventricular complexes, very  rare couplets and a brief period of ventricular try Gemini.  When tachycardia is not seen.   There is no significant bradycardia.   Symptom driven recordings show isolated premature atrial contractions or premature ventricular contractions.   Minimally abnormal event monitor.  Patient's symptoms are associated with isolated ectopic beats.  EKG:  EKG is not ordered today; ECG from 04/07/2022 shows NSR, normal tracing.  QTc 464 ms. Recent Labs: 09/27/2022: ALT 33; Hemoglobin 12.8; Platelets 283 12/06/2022: BUN 71; Creatinine, Ser 1.31; NT-Pro BNP 46; Potassium 4.1; Sodium 140  Recent Lipid Panel    Component Value Date/Time   CHOL 233 (H) 05/13/2021 0949   TRIG 386 (H) 05/13/2021 0949   HDL 40 05/13/2021 0949   CHOLHDL 5.8 (H) 05/13/2021 0949   LDLCALC 124 (H) 05/13/2021 0949     Risk Assessment/Calculations:           Physical Exam:    VS:  BP 112/78 (BP Location: Left Arm, Patient Position: Sitting, Cuff Size: Normal)   Pulse (!) 102   Ht 5\' 8"  (1.727 m)   Wt 200 lb 9.6 oz (91 kg)   SpO2 92%   BMI 30.50 kg/m     Wt Readings from Last 3 Encounters:  12/13/22 200 lb 9.6 oz (91 kg)  09/29/22 199 lb (90.3 kg)  09/27/22 200 lb 9.6 oz (91 kg)     General: Alert, oriented x3, no distress Head: no evidence of trauma, PERRL, EOMI, no exophtalmos or lid lag, no myxedema, no xanthelasma; normal ears, nose and oropharynx Neck: normal jugular venous pulsations and no hepatojugular reflux; brisk carotid pulses without  delay and no carotid bruits Chest: clear to auscultation, no signs of consolidation by percussion or palpation, normal fremitus, symmetrical and full respiratory excursions Cardiovascular: normal position and quality of the apical impulse, regular rhythm, normal first and second heart sounds, no murmurs, rubs or gallops Abdomen: no tenderness or distention, no masses by palpation, no abnormal pulsatility or arterial bruits, normal bowel sounds, no hepatosplenomegaly Extremities: no clubbing, cyanosis or edema; 2+ radial, ulnar and brachial pulses bilaterally; 2+ right femoral, posterior tibial and dorsalis pedis pulses; 2+ left femoral, posterior tibial and dorsalis pedis pulses; no subclavian or femoral bruits Neurological: grossly nonfocal Psych: Normal mood and affect    ASSESSMENT:    1. Chronic combined systolic and diastolic congestive heart failure (HCC)   2. Nonischemic cardiomyopathy (HCC)   3. Coronary artery disease involving native coronary artery of native heart without angina pectoris   4. Type 2 diabetes mellitus without complication, with long-term current use of insulin (HCC)   5. Primary hypertension   6. Mixed hyperlipidemia       PLAN:    In order of problems listed above:  CHF: LV function remains mild-mildly depressed with an EF of 40%, due to nonischemic dilated cardiomyopathy.  He seems to be hypovolemic.  Will stop his loop diuretic altogether, but can take it "as needed" only if his weight is over 200 pounds on his home scale.  He is on maximum medical therapy (maximum dose Entresto, good dose of beta-blocker and spironolactone, not using SGLT2 inhibitors due to side effects).       CAD: Extensive atherosclerotic plaque without meaningful stenoses.  Focus is on risk factor modification.  Ideally would like his LDL cholesterol less than 70. DM: He believes his glycemic control is getting better, but last hemoglobin A1c in May was up to 8.5%.  I do not think his  glucose is  high enough to explain his hypovolemia due to polyuria. HTN: Blood pressure in normal range. HLP: Started on rosuvastatin.  Due for repeat lipid profile. OSA: Compliant with CPAP (Monitored by Dr. Vickey Huger). Cognitive deficits: Unchanged. Also followed by Dr. Vickey Huger in the neurology clinic.  Note absence of specific abnormalities on PET scan of the brain and MRI of the brain (although he does have some mild chronic microvascular ischemic changes in the hemispheres and pons).        Medication Adjustments/Labs and Tests Ordered: Current medicines are reviewed at length with the patient today.  Concerns regarding medicines are outlined above.  Orders Placed This Encounter  Procedures   EKG 12-Lead   Meds ordered this encounter  Medications   furosemide (LASIX) 20 MG tablet    Sig: Take 1 tablet (20 mg total) by mouth as needed. Take 1 tablet (20 mg) daily if weight is greater than 200.    Dispense:  45 tablet    Refill:  3    Patient Instructions  Medication Instructions:  Your physician has recommended you make the following change in your medication: 1) STOP taking Lasix (furosemide) daily - only take if weight is greater than 200lbs. *If you need a refill on your cardiac medications before your next appointment, please call your pharmacy*  Follow-Up: At [facility], you and your health needs are our priority.  As part of our continuing mission to provide you with exceptional heart care, we have created designated Provider Care Teams.  These Care Teams include your primary Cardiologist (physician) and Advanced Practice Providers (APPs -  Physician Assistants and Nurse Practitioners) who all work together to provide you with the care you need, when you need it.  Your next appointment:   1 year  Provider:   Thurmon Fair, MD       Signed, Thurmon Fair, MD  12/16/2022 4:56 PM    [facility]

## 2022-12-15 DIAGNOSIS — N182 Chronic kidney disease, stage 2 (mild): Secondary | ICD-10-CM | POA: Diagnosis not present

## 2022-12-15 DIAGNOSIS — E785 Hyperlipidemia, unspecified: Secondary | ICD-10-CM | POA: Diagnosis not present

## 2022-12-15 DIAGNOSIS — I129 Hypertensive chronic kidney disease with stage 1 through stage 4 chronic kidney disease, or unspecified chronic kidney disease: Secondary | ICD-10-CM | POA: Diagnosis not present

## 2022-12-15 DIAGNOSIS — Z125 Encounter for screening for malignant neoplasm of prostate: Secondary | ICD-10-CM | POA: Diagnosis not present

## 2022-12-15 DIAGNOSIS — Z794 Long term (current) use of insulin: Secondary | ICD-10-CM | POA: Diagnosis not present

## 2022-12-15 DIAGNOSIS — E1122 Type 2 diabetes mellitus with diabetic chronic kidney disease: Secondary | ICD-10-CM | POA: Diagnosis not present

## 2022-12-15 DIAGNOSIS — Z1212 Encounter for screening for malignant neoplasm of rectum: Secondary | ICD-10-CM | POA: Diagnosis not present

## 2022-12-15 DIAGNOSIS — R7989 Other specified abnormal findings of blood chemistry: Secondary | ICD-10-CM | POA: Diagnosis not present

## 2022-12-16 ENCOUNTER — Encounter: Payer: Self-pay | Admitting: Cardiovascular Disease

## 2022-12-18 DIAGNOSIS — N182 Chronic kidney disease, stage 2 (mild): Secondary | ICD-10-CM | POA: Diagnosis not present

## 2022-12-18 DIAGNOSIS — Z794 Long term (current) use of insulin: Secondary | ICD-10-CM | POA: Diagnosis not present

## 2022-12-18 DIAGNOSIS — E1122 Type 2 diabetes mellitus with diabetic chronic kidney disease: Secondary | ICD-10-CM | POA: Diagnosis not present

## 2022-12-18 DIAGNOSIS — I5040 Unspecified combined systolic (congestive) and diastolic (congestive) heart failure: Secondary | ICD-10-CM | POA: Diagnosis not present

## 2022-12-21 DIAGNOSIS — E785 Hyperlipidemia, unspecified: Secondary | ICD-10-CM | POA: Diagnosis not present

## 2022-12-21 DIAGNOSIS — Z1212 Encounter for screening for malignant neoplasm of rectum: Secondary | ICD-10-CM | POA: Diagnosis not present

## 2022-12-22 DIAGNOSIS — N182 Chronic kidney disease, stage 2 (mild): Secondary | ICD-10-CM | POA: Diagnosis not present

## 2022-12-22 DIAGNOSIS — E785 Hyperlipidemia, unspecified: Secondary | ICD-10-CM | POA: Diagnosis not present

## 2022-12-22 DIAGNOSIS — F39 Unspecified mood [affective] disorder: Secondary | ICD-10-CM | POA: Diagnosis not present

## 2022-12-22 DIAGNOSIS — Z0000 Encounter for general adult medical examination without abnormal findings: Secondary | ICD-10-CM | POA: Diagnosis not present

## 2022-12-22 DIAGNOSIS — Z1331 Encounter for screening for depression: Secondary | ICD-10-CM | POA: Diagnosis not present

## 2022-12-22 DIAGNOSIS — I2510 Atherosclerotic heart disease of native coronary artery without angina pectoris: Secondary | ICD-10-CM | POA: Diagnosis not present

## 2022-12-22 DIAGNOSIS — I5042 Chronic combined systolic (congestive) and diastolic (congestive) heart failure: Secondary | ICD-10-CM | POA: Diagnosis not present

## 2022-12-22 DIAGNOSIS — I130 Hypertensive heart and chronic kidney disease with heart failure and stage 1 through stage 4 chronic kidney disease, or unspecified chronic kidney disease: Secondary | ICD-10-CM | POA: Diagnosis not present

## 2022-12-22 DIAGNOSIS — E118 Type 2 diabetes mellitus with unspecified complications: Secondary | ICD-10-CM | POA: Diagnosis not present

## 2022-12-22 DIAGNOSIS — R82998 Other abnormal findings in urine: Secondary | ICD-10-CM | POA: Diagnosis not present

## 2022-12-22 DIAGNOSIS — Z1339 Encounter for screening examination for other mental health and behavioral disorders: Secondary | ICD-10-CM | POA: Diagnosis not present

## 2022-12-22 DIAGNOSIS — E1122 Type 2 diabetes mellitus with diabetic chronic kidney disease: Secondary | ICD-10-CM | POA: Diagnosis not present

## 2022-12-22 DIAGNOSIS — M170 Bilateral primary osteoarthritis of knee: Secondary | ICD-10-CM | POA: Diagnosis not present

## 2022-12-22 DIAGNOSIS — Z6830 Body mass index (BMI) 30.0-30.9, adult: Secondary | ICD-10-CM | POA: Diagnosis not present

## 2022-12-28 DIAGNOSIS — G4733 Obstructive sleep apnea (adult) (pediatric): Secondary | ICD-10-CM | POA: Diagnosis not present

## 2023-01-03 DIAGNOSIS — G4733 Obstructive sleep apnea (adult) (pediatric): Secondary | ICD-10-CM | POA: Diagnosis not present

## 2023-01-10 DIAGNOSIS — R8271 Bacteriuria: Secondary | ICD-10-CM | POA: Diagnosis not present

## 2023-01-31 DIAGNOSIS — R053 Chronic cough: Secondary | ICD-10-CM | POA: Diagnosis not present

## 2023-01-31 DIAGNOSIS — M62838 Other muscle spasm: Secondary | ICD-10-CM | POA: Diagnosis not present

## 2023-01-31 DIAGNOSIS — R0781 Pleurodynia: Secondary | ICD-10-CM | POA: Diagnosis not present

## 2023-02-15 DIAGNOSIS — M5416 Radiculopathy, lumbar region: Secondary | ICD-10-CM | POA: Diagnosis not present

## 2023-02-27 DIAGNOSIS — E118 Type 2 diabetes mellitus with unspecified complications: Secondary | ICD-10-CM | POA: Diagnosis not present

## 2023-02-27 DIAGNOSIS — G8929 Other chronic pain: Secondary | ICD-10-CM | POA: Diagnosis not present

## 2023-02-27 DIAGNOSIS — R1011 Right upper quadrant pain: Secondary | ICD-10-CM | POA: Diagnosis not present

## 2023-02-27 DIAGNOSIS — K5900 Constipation, unspecified: Secondary | ICD-10-CM | POA: Diagnosis not present

## 2023-02-27 DIAGNOSIS — M62838 Other muscle spasm: Secondary | ICD-10-CM | POA: Diagnosis not present

## 2023-02-27 DIAGNOSIS — N182 Chronic kidney disease, stage 2 (mild): Secondary | ICD-10-CM | POA: Diagnosis not present

## 2023-02-27 DIAGNOSIS — J45909 Unspecified asthma, uncomplicated: Secondary | ICD-10-CM | POA: Diagnosis not present

## 2023-02-27 DIAGNOSIS — I130 Hypertensive heart and chronic kidney disease with heart failure and stage 1 through stage 4 chronic kidney disease, or unspecified chronic kidney disease: Secondary | ICD-10-CM | POA: Diagnosis not present

## 2023-02-27 DIAGNOSIS — I5042 Chronic combined systolic (congestive) and diastolic (congestive) heart failure: Secondary | ICD-10-CM | POA: Diagnosis not present

## 2023-02-27 DIAGNOSIS — E1122 Type 2 diabetes mellitus with diabetic chronic kidney disease: Secondary | ICD-10-CM | POA: Diagnosis not present

## 2023-02-27 DIAGNOSIS — Z7689 Persons encountering health services in other specified circumstances: Secondary | ICD-10-CM | POA: Diagnosis not present

## 2023-02-27 DIAGNOSIS — R071 Chest pain on breathing: Secondary | ICD-10-CM | POA: Diagnosis not present

## 2023-02-27 DIAGNOSIS — R0781 Pleurodynia: Secondary | ICD-10-CM | POA: Diagnosis not present

## 2023-03-16 DIAGNOSIS — H6123 Impacted cerumen, bilateral: Secondary | ICD-10-CM | POA: Diagnosis not present

## 2023-03-16 DIAGNOSIS — H9193 Unspecified hearing loss, bilateral: Secondary | ICD-10-CM | POA: Diagnosis not present

## 2023-03-19 ENCOUNTER — Telehealth: Payer: Self-pay | Admitting: Cardiovascular Disease

## 2023-03-19 NOTE — Telephone Encounter (Signed)
Pt c/o swelling/edema: STAT if pt has developed SOB within 24 hours  If swelling, where is the swelling located? Feet   How much weight have you gained and in what time span? Doesn't know   Have you gained 2 pounds in a day or 5 pounds in a week? Unsure   Do you have a log of your daily weights (if so, list)? Doesn't weigh normally   Are you currently taking a fluid pill? No   Are you currently SOB? No   Have you traveled recently in a car or plane for an extended period of time? No

## 2023-03-19 NOTE — Telephone Encounter (Signed)
Patient identification verified by 2 forms. Marilynn Rail, RN   Called and spoke to patient wife Gregor Hams states:   -patient has some swelling in his feet   -unsure what the directions are for the Lasix  -patient has increased fatigue   -swelling started 2 days ago   -does not do daily weights   -weight today is 204lb  Graciella Belton denies:   -SOB/difficulty breathing  Informed dianne per 8/7 OV:  Will stop his loop diuretic altogether, but can take it "as needed" only if his weight is over 200 pounds on his home scale.  Advised Dianne patient can take one 20mg  tablet lasix since weight >200lbs, can repeat as needed tomorrow if weight >200lbs  Dianne verbalized understanding, no questions at this time

## 2023-03-19 NOTE — Telephone Encounter (Signed)
Agree with advice. Thanks.

## 2023-03-21 DIAGNOSIS — I129 Hypertensive chronic kidney disease with stage 1 through stage 4 chronic kidney disease, or unspecified chronic kidney disease: Secondary | ICD-10-CM | POA: Diagnosis not present

## 2023-03-21 DIAGNOSIS — N1832 Chronic kidney disease, stage 3b: Secondary | ICD-10-CM | POA: Diagnosis not present

## 2023-03-21 DIAGNOSIS — I5042 Chronic combined systolic (congestive) and diastolic (congestive) heart failure: Secondary | ICD-10-CM | POA: Diagnosis not present

## 2023-03-21 DIAGNOSIS — E118 Type 2 diabetes mellitus with unspecified complications: Secondary | ICD-10-CM | POA: Diagnosis not present

## 2023-03-21 DIAGNOSIS — E785 Hyperlipidemia, unspecified: Secondary | ICD-10-CM | POA: Diagnosis not present

## 2023-03-21 DIAGNOSIS — Z794 Long term (current) use of insulin: Secondary | ICD-10-CM | POA: Diagnosis not present

## 2023-03-29 ENCOUNTER — Other Ambulatory Visit (HOSPITAL_COMMUNITY): Payer: Self-pay | Admitting: *Deleted

## 2023-03-30 ENCOUNTER — Encounter (HOSPITAL_COMMUNITY): Payer: PPO

## 2023-03-30 ENCOUNTER — Telehealth: Payer: Self-pay | Admitting: Primary Care

## 2023-03-30 ENCOUNTER — Telehealth: Payer: PPO | Admitting: Primary Care

## 2023-03-30 DIAGNOSIS — J4520 Mild intermittent asthma, uncomplicated: Secondary | ICD-10-CM | POA: Diagnosis not present

## 2023-03-30 DIAGNOSIS — R053 Chronic cough: Secondary | ICD-10-CM | POA: Diagnosis not present

## 2023-03-30 DIAGNOSIS — G4733 Obstructive sleep apnea (adult) (pediatric): Secondary | ICD-10-CM | POA: Diagnosis not present

## 2023-03-30 MED ORDER — BUDESONIDE-FORMOTEROL FUMARATE 80-4.5 MCG/ACT IN AERO: 2.0000 | INHALATION_SPRAY | Freq: Two times a day (BID) | RESPIRATORY_TRACT | 1 refills | Status: DC

## 2023-03-30 MED ORDER — IPRATROPIUM BROMIDE HFA 17 MCG/ACT IN AERS: 1.0000 | INHALATION_SPRAY | Freq: Three times a day (TID) | RESPIRATORY_TRACT | 1 refills | Status: DC

## 2023-03-30 MED ORDER — GABAPENTIN 100 MG PO CAPS: 100.0000 mg | ORAL_CAPSULE | Freq: Three times a day (TID) | ORAL | 1 refills | Status: DC

## 2023-03-30 NOTE — Patient Instructions (Addendum)
-  CHRONIC COUGH: A chronic cough is a cough that lasts for a long time, often due to underlying conditions. We will discontinue Flonase and start you on Atrovent nasal spray to address potential postnasal drip. Additionally, you will begin taking Gabapentin, starting with 100mg  at night for 2-3 days, then twice daily for 2-3 days, and then three times daily if tolerated. Please monitor for any neurologic symptoms and stop taking Gabapentin if any occur. If your cough persists, we may refer you to an Ear, Nose, and Throat specialist.  -ASTHMA/SMALL AIRWAY DISEASE: Asthma and small airway disease cause difficulty in breathing due to inflammation and narrowing of the airways. We will discontinue Trelegy and start you on a different inhaler (Symbicort, Dulera, or Advair) that is not a dry powder inhaler. Remember to rinse your mouth after using the inhaler to prevent potential side effects.  -CONGESTIVE HEART FAILURE: Congestive heart failure is a condition where the heart does not pump blood as well as it should, leading to symptoms like shortness of breath. We will continue with your current management plan and monitor your symptoms.  Rx: Gabapentin Symbicort Atrovent   Orders: None   Follow-up 4-6 weeks with Beth NP to assess medication response (needs to be scheduled)

## 2023-03-30 NOTE — Telephone Encounter (Signed)
Needs FU 4-6 weeks with Beth NP to assess medication response (needs to be scheduled)

## 2023-03-30 NOTE — Progress Notes (Signed)
Virtual Visit via Video Note  I connected with Donald [name] on 03/30/23 at  3:30 PM EST by a video enabled telemedicine application and verified that I am speaking with the correct person using two identifiers.  Location: Patient: Home Provider: Office    I discussed the limitations of evaluation and management by telemedicine and the availability of in person appointments. The patient expressed understanding and agreed to proceed.  History of Present Illness:  72 year old male with history of UACS vs asthma followed in our clinic last seen 2016, GERD, sleep-disordered breathing on CPAP and O2, referred for acute cough.  Previous [facility] pulmonary encounter:  01/27/21- Original consult, Dr. Janey [name] ENT at [facility] 2016, they felt cough/arytenoid edema caused by GERD, LPR.  He has had cough which he thinks is related to something in his throat. He has had neck surgery in past for herniated disks - 10 years ago. He also has elevated left collarbone related to prior injury and healing and still wonders if it's related to his cough. It is not productive. There is positional component to his cough where if he flexes his neck it's worse. He has no trouble swallowing. No real trouble with choking/aspirating on food/liquids.   He does think a prednisone course helped a little bit.  He is still taking protonix 40 mg once daily in AM right before breakfast.   Very frequent sensation of PND. Doesn't feel sinonasal congestion. Had deviated septum surgery in 1980s/90s.  He has mild DOE which he attributes to asthma - has not worsened since visit in 2016. Takes albuterol intermittently.   No family history of lung disease  He has secondhand smoke exposure.   He was a Quarry manager, worked with Scientist, forensic. 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 Had frequent dust/particulate exposure without mask back then (last in 1981). Has beagle at home. No pet birds. He has no hot tub. Lived in [place] for  some time, [place].  Interval HPI: At last visit reinforced TLC measures for GERD. He thinks his cough has probably gotten a little better after switching to vanilla ice cream and no longer eating anything after 5pm. He also self-increased to BID ppi. Still bothered by throat clearing, irritable sensation in his throat.   Otherwise pertinent review of systems is negative.   # Acute on chronic cough: # Mild restrictive ventilatory defect, borderline BD response: # GERD/LPR Etiology of cough may be multifactorial - regardless he's doing better at this visit. I still feel cough by history (and prior [facility] exam in 2016) is most consistent with LPR/GERD, less likely longstanding asthma (does have borderline BD response). Alternatively 9% of patients in entresto arm of PARADIGM-HF trial did develop cough. Restriction may be due to combination of habitus and volume overload at time of PFT.   # OSA on CPAP   Plan: - flonase for at least a few weeks when has postnasal drainage, continue allegra - decrease to protonix daily in morning 30 minutes before eating OR 30 min before dinner in evening - If you find that your cough worsens as you decrease dose protonix to once daily, then I'd recommend getting an appointment with GI Dr. Ewing Schlein just to make sure there's not something underlying causing worse reflux - Conservative measures discussed to work on throat clearing (look for bicarbonate or baking soda gum on [place] - there's a [place] company that makes this) - We can consider a controller inhaler for asthma at or  before next visit if doing no better   03/30/2023- Interim hx  Discussed the use of AI scribe software for clinical note transcription with the patient, who gave verbal consent to proceed.  History of Present Illness   Patient of [name] last seen 12/28/21 for chronic cough. Patient contacted today for acute visit due to cough. Followed by our office for UACS vs asthma, GERD,  sleep-disordered breathing on CPAP and O2.    Patient has had a persistent cough that has been present for several years. The cough is described as dry, non-productive, and associated with a tickling sensation in the throat. The patient reports that the cough is often preceded by a sensation in the throat that lasts for about 15-20 seconds before the coughing episode. The cough has been severe enough to cause muscle strain in the ribs due to the force of the coughing. The patient has been managing the cough and associated rib pain with hydrocodone and ibuprofen.  The patient was previously on Trelegy, which altered the nature of the cough to a more productive type and causes a lot of sinus drainage. However, the cough has since returned and remains dry. The patient has also been taking Allegra daily and Flonase intermittently, with uncertain benefit.  The patient also reports experiencing shortness of breath with exertion, which he attributes to his underlying lung and heart conditions. Despite these symptoms, the patient describes his breathing as normal for his condition.      Observations/Objective:  Appears well without over respiratory symptoms   Assessment and Plan:  Chronic Cough Etiology cough likely multifactorial. Cough remains persistent despite multiple interventions. Currently managed with hydrocodone and ibuprofen. Cough described as dry, nonproductive, and associated with a tickling sensation in the throat. No significant sinus drainage noted. -Initiate Gabapentin 100mg  at night for 2-3 days, then twice daily for 2-3 days, and then three times daily if tolerated. Monitor for any neurologic symptoms and discontinue if any occur. -Consider referral to an Ear, Nose, and Throat specialist if cough persists despite these interventions.  Asthma/Small Airway Disease Evidence of bronchodilator response on previous breathing tests and mild small airway disease on CT scan. Patient reports  some difficulty with breathing on exertion. -Discontinue Trelegy and start a different ICS LABA inhaler (Symbicort, Dulera, or Advair) that is not a dry powder inhaler. -Rinse mouth after using the inhaler to prevent potential side effects.  Congestive Heart Failure Patient reports shortness of breath with exertion, potentially related to heart condition. -Continue current management and monitor symptoms.   OSA - Continue CPAP at bedtime with oxygen  GERD/LPR - Continue Protonix daily  Post Nasal Drip  - Continue Allegra -Discontinue Flonase and start Atrovent nasal spray for potential postnasal drip.   Follow Up Instructions:  - Follow-up in a couple of weeks to assess response to new interventions.     I discussed the assessment and treatment plan with the patient. The patient was provided an opportunity to ask questions and all were answered. The patient agreed with the plan and demonstrated an understanding of the instructions.   The patient was advised to call back or seek an in-person evaluation if the symptoms worsen or if the condition fails to improve as anticipated.  I provided 25 minutes of non-face-to-face time during this encounter.   Glenford Bayley, NP

## 2023-03-31 ENCOUNTER — Other Ambulatory Visit: Payer: Self-pay

## 2023-03-31 ENCOUNTER — Emergency Department (HOSPITAL_BASED_OUTPATIENT_CLINIC_OR_DEPARTMENT_OTHER)
Admission: EM | Admit: 2023-03-31 | Discharge: 2023-03-31 | Disposition: A | Discharge status: Home / Self Care | Payer: PPO | Attending: Emergency Medicine | Admitting: Emergency Medicine

## 2023-03-31 ENCOUNTER — Encounter (HOSPITAL_BASED_OUTPATIENT_CLINIC_OR_DEPARTMENT_OTHER): Payer: Self-pay | Admitting: Emergency Medicine

## 2023-03-31 ENCOUNTER — Emergency Department (HOSPITAL_BASED_OUTPATIENT_CLINIC_OR_DEPARTMENT_OTHER): Payer: PPO

## 2023-03-31 DIAGNOSIS — N189 Chronic kidney disease, unspecified: Secondary | ICD-10-CM | POA: Diagnosis not present

## 2023-03-31 DIAGNOSIS — Z79899 Other long term (current) drug therapy: Secondary | ICD-10-CM | POA: Insufficient documentation

## 2023-03-31 DIAGNOSIS — Z1152 Encounter for screening for COVID-19: Secondary | ICD-10-CM | POA: Diagnosis not present

## 2023-03-31 DIAGNOSIS — Z794 Long term (current) use of insulin: Secondary | ICD-10-CM | POA: Insufficient documentation

## 2023-03-31 DIAGNOSIS — I129 Hypertensive chronic kidney disease with stage 1 through stage 4 chronic kidney disease, or unspecified chronic kidney disease: Secondary | ICD-10-CM | POA: Diagnosis not present

## 2023-03-31 DIAGNOSIS — I700 Atherosclerosis of aorta: Secondary | ICD-10-CM | POA: Diagnosis not present

## 2023-03-31 DIAGNOSIS — R059 Cough, unspecified: Secondary | ICD-10-CM | POA: Diagnosis not present

## 2023-03-31 DIAGNOSIS — R0602 Shortness of breath: Secondary | ICD-10-CM | POA: Insufficient documentation

## 2023-03-31 DIAGNOSIS — I2510 Atherosclerotic heart disease of native coronary artery without angina pectoris: Secondary | ICD-10-CM | POA: Diagnosis not present

## 2023-03-31 DIAGNOSIS — J9811 Atelectasis: Secondary | ICD-10-CM | POA: Diagnosis not present

## 2023-03-31 LAB — CBC
HCT: 39.8 % (ref 39.0–52.0)
Hemoglobin: 12.7 g/dL — ABNORMAL LOW (ref 13.0–17.0)
MCH: 24 pg — ABNORMAL LOW (ref 26.0–34.0)
MCHC: 31.9 g/dL (ref 30.0–36.0)
MCV: 75.1 fL — ABNORMAL LOW (ref 80.0–100.0)
Platelets: 346 10*3/uL (ref 150–400)
RBC: 5.3 MIL/uL (ref 4.22–5.81)
RDW: 19.7 % — ABNORMAL HIGH (ref 11.5–15.5)
WBC: 15 10*3/uL — ABNORMAL HIGH (ref 4.0–10.5)
nRBC: 0 % (ref 0.0–0.2)

## 2023-03-31 LAB — BASIC METABOLIC PANEL
Anion gap: 12 (ref 5–15)
BUN: 61 mg/dL — ABNORMAL HIGH (ref 8–23)
CO2: 20 mmol/L — ABNORMAL LOW (ref 22–32)
Calcium: 9.8 mg/dL (ref 8.9–10.3)
Chloride: 107 mmol/L (ref 98–111)
Creatinine, Ser: 1.27 mg/dL — ABNORMAL HIGH (ref 0.61–1.24)
GFR, Estimated: >60 mL/min (ref >60)
Glucose, Bld: 99 mg/dL (ref 70–99)
Potassium: 3.4 mmol/L — ABNORMAL LOW (ref 3.5–5.1)
Sodium: 139 mmol/L (ref 135–145)

## 2023-03-31 LAB — RESP PANEL BY RT-PCR (RSV, FLU A&B, COVID)  RVPGX2
Influenza A by PCR: NEGATIVE
Influenza B by PCR: NEGATIVE
Resp Syncytial Virus by PCR: NEGATIVE
SARS Coronavirus 2 by RT PCR: NEGATIVE

## 2023-03-31 LAB — BRAIN NATRIURETIC PEPTIDE: B Natriuretic Peptide: 26 pg/mL (ref 0.0–100.0)

## 2023-03-31 LAB — TROPONIN I (HIGH SENSITIVITY): Troponin I (High Sensitivity): 19 ng/L — ABNORMAL HIGH (ref <18)

## 2023-03-31 MED ORDER — AZITHROMYCIN 250 MG PO TABS: 250.0000 mg | ORAL_TABLET | Freq: Every day | ORAL | 0 refills | Status: AC

## 2023-03-31 MED ORDER — AZITHROMYCIN 250 MG PO TABS
500.0000 mg | ORAL_TABLET | Freq: Once | ORAL | Status: AC
Start: 2023-03-31 — End: 2023-03-31
  Administered 2023-03-31: 500 mg via ORAL
  Filled 2023-03-31: qty 2

## 2023-03-31 MED ORDER — PREDNISONE 10 MG PO TABS: 20.0000 mg | ORAL_TABLET | Freq: Every day | ORAL | 0 refills | Status: DC

## 2023-03-31 MED ORDER — IOHEXOL 350 MG/ML SOLN
75.0000 mL | Freq: Once | INTRAVENOUS | Status: AC | PRN
  Administered 2023-03-31: 75 mL via INTRAVENOUS

## 2023-03-31 NOTE — ED Triage Notes (Signed)
Intermittent x 1 week that worsened 1 hour pta. Completed steroid course for cough today. Had visit with pulmonologist yesterday.

## 2023-03-31 NOTE — ED Notes (Signed)
RT Note: Patient is having some SOB upon exertion but has clear breath sounds and no HX Asthma. Is currently on prednisone

## 2023-03-31 NOTE — ED Notes (Signed)
Patient transported to CT 

## 2023-03-31 NOTE — Discharge Instructions (Addendum)
Take antibiotic as prescribed.  Take next dose of steroid tomorrow.  Follow-up with primary care doctors.

## 2023-03-31 NOTE — ED Notes (Signed)
Discharge paperwork reviewed entirely with patient, including follow up care. Pain was under control. The patient received instruction and coaching on their prescriptions, and all follow-up questions were answered.  Pt verbalized understanding as well as all parties involved. No questions or concerns voiced at the time of discharge. No acute distress noted.   Pt was wheeled out to the PVA in a wheelchair without incident.  Pt advised they will notify their PCP immediately.

## 2023-03-31 NOTE — ED Provider Notes (Addendum)
[place] [facility] AT [facility] Provider Note   CSN: [id number] Arrival date & time: 03/31/23  1722     History  Chief Complaint  Patient presents with   Shortness of Breath    Donald [name] is a 72 y.o. male.  Patient here with shortness of breath for the last few days.  He is currently on steroids.  Been having a chronic cough for some time but worse here recently.  Has been doing inhaler rest.  He has had pneumonia in the past.  History of CKD.  Denies any chest pain.  Nothing makes it worse or better.  He is on his fifth day of steroids.  Denies any recent surgery or travel.  Denies any fever or chills.  Denies any leg swelling.  Shortness of breath slightly worse this afternoon.  The history is provided by the patient.       Home Medications Prior to Admission medications   Medication Sig Start Date End Date Taking? Authorizing Provider  azithromycin (ZITHROMAX) 250 MG tablet Take 1 tablet (250 mg total) by mouth daily for 4 days. Take first 2 tablets together, then 1 every day until finished. 03/31/23 04/04/23 Yes Joyous Gleghorn, DO  predniSONE (DELTASONE) 10 MG tablet Take 2 tablets (20 mg total) by mouth daily for 2 days. 03/31/23 04/02/23 Yes Josearmando Kuhnert, DO  budesonide-formoterol (SYMBICORT) 80-4.5 MCG/ACT inhaler Inhale 2 puffs into the lungs in the morning and at bedtime. 03/30/23   Glenford Bayley, NP  Continuous Blood Gluc Sensor (FREESTYLE LIBRE 14 DAY SENSOR) MISC  07/17/20   [provider]  DULoxetine (CYMBALTA) 30 MG capsule Take 90 mg by mouth daily. 05/02/21   [provider]  finasteride (PROSCAR) 5 MG tablet Take 5 mg by mouth daily. 01/28/21   [provider]  furosemide (LASIX) 20 MG tablet Take 1 tablet (20 mg total) by mouth as needed. Take 1 tablet (20 mg) daily if weight is greater than 200. 12/13/22   Croitoru, [name], MD  gabapentin (NEURONTIN) 100 MG capsule Take 1 capsule (100 mg total) by  mouth 3 (three) times daily. 03/30/23   Glenford Bayley, NP  HYDROcodone-acetaminophen (NORCO/VICODIN) 5-325 MG tablet Take 1-2 tablets by mouth every 4 (four) hours as needed for moderate pain ((score 4 to 6)). 09/30/22   Val Eagle D, NP  insulin degludec (TRESIBA FLEXTOUCH) 100 UNIT/ML FlexTouch Pen Inject 100 Units into the skin daily. 07/17/20   [provider]  insulin lispro (HUMALOG KWIKPEN) 200 UNIT/ML KwikPen Inject 40-60 Units into the skin as needed. 05/18/22   [provider]  Insulin Pen Needle (UNIFINE PENTIPS PLUS) 32G X 4 MM MISC USE WITH TRESIBA DAILY 01/27/19   [provider]  ipratropium (ATROVENT HFA) 17 MCG/ACT inhaler Inhale 1 puff into the lungs 3 (three) times daily. 03/30/23 03/29/24  Glenford Bayley, NP  memantine (NAMENDA) 10 MG tablet Take 1 tablet (10 mg total) by mouth 2 (two) times daily. 08/24/22   Lomax, Amy, NP  metFORMIN (GLUCOPHAGE) 1000 MG tablet Take 1 tablet (1,000 mg total) by mouth 2 (two) times daily with a meal. Restart on 8/22 12/27/18   Edsel Petrin, DO  metoprolol succinate (TOPROL-XL) 50 MG 24 hr tablet Take 50 mg by mouth 2 (two) times daily. Take with or immediately following a meal.    [provider]  naproxen sodium (ALEVE) 220 MG tablet Take 220 mg by mouth daily as needed.    [provider]  pantoprazole (PROTONIX) 40 MG tablet Take 1 tablet (40 mg total) by mouth 2 (two) times daily. Patient taking differently: Take 40 mg by mouth daily. 12/28/21   Omar Person, MD  rosuvastatin (CRESTOR) 20 MG tablet TAKE ONE TABLET BY MOUTH ONE TIME DAILY 05/09/22   Croitoru, Rachelle Hora, MD  sacubitril-valsartan (ENTRESTO) 97-103 MG Take 1 tablet by mouth 2 (two) times daily. 06/08/22   Croitoru, [name], MD  silodosin (RAPAFLO) 8 MG CAPS capsule Take 8 mg by mouth daily. 04/01/21   [provider]  spironolactone (ALDACTONE) 25 MG tablet Take 0.5 tablets (12.5 mg total) by mouth daily. 04/07/22    Croitoru, [name], MD      Allergies    Ciprofloxacin, Empagliflozin, Levofloxacin, Aricept [donepezil hcl], Bactrim [sulfamethoxazole-trimethoprim], Levofloxacin, and Misc. sulfonamide containing compounds    Review of Systems   Review of Systems  Physical Exam Updated Vital Signs BP (!) 129/92 (BP Location: Left Arm)   Pulse 92   Temp 97.8 F (36.6 C) (Oral)   Resp 18   SpO2 97%  Physical Exam Vitals and nursing note reviewed.  Constitutional:      General: He is not in acute distress.    Appearance: He is well-developed. He is not ill-appearing.  HENT:     Head: Normocephalic and atraumatic.     Mouth/Throat:     Mouth: Mucous membranes are moist.  Eyes:     Conjunctiva/sclera: Conjunctivae normal.     Pupils: Pupils are equal, round, and reactive to light.  Cardiovascular:     Rate and Rhythm: Normal rate and regular rhythm.     Pulses: Normal pulses.     Heart sounds: Normal heart sounds. No murmur heard. Pulmonary:     Effort: Pulmonary effort is normal. No respiratory distress.     Breath sounds: Normal breath sounds.  Abdominal:     Palpations: Abdomen is soft.     Tenderness: There is no abdominal tenderness.  Musculoskeletal:        General: No swelling.     Cervical back: Normal range of motion and neck supple.  Skin:    General: Skin is warm and dry.     Capillary Refill: Capillary refill takes less than 2 seconds.  Neurological:     Mental Status: He is alert.  Psychiatric:        Mood and Affect: Mood normal.     ED Results / Procedures / Treatments   Labs (all labs ordered are listed, but only abnormal results are displayed) Labs Reviewed  BASIC METABOLIC PANEL - Abnormal; Notable for the following components:      Result Value   Potassium 3.4 (*)    CO2 20 (*)    BUN 61 (*)    Creatinine, Ser 1.27 (*)    All other components within normal limits  CBC - Abnormal; Notable for the following components:   WBC 15.0 (*)    Hemoglobin 12.7 (*)     MCV 75.1 (*)    MCH 24.0 (*)    RDW 19.7 (*)    All other components within normal limits  TROPONIN I (HIGH SENSITIVITY) - Abnormal; Notable for the following components:   Troponin I (High Sensitivity) 19 (*)    All other components within normal limits  RESP PANEL BY RT-PCR (RSV, FLU A&B, COVID)  [id number]  BRAIN NATRIURETIC PEPTIDE    EKG EKG Interpretation Date/Time:  Saturday March 31 2023 17:30:44 EST Ventricular Rate:  93 PR Interval:  135 QRS Duration:  94 QT Interval:  384 QTC Calculation: 478 R Axis:   14  Text Interpretation: Sinus rhythm Minimal ST depression, lateral leads Confirmed by Virgina Norfolk [phone]) on 03/31/2023 5:34:08 PM  Radiology CT Angio Chest PE W and/or Wo Contrast  Result Date: 03/31/2023 CLINICAL DATA:  Shortness of breath. Worsened in the last hour. Cough. EXAM: CT ANGIOGRAPHY CHEST WITH CONTRAST TECHNIQUE: Multidetector CT imaging of the chest was performed using the standard protocol during bolus administration of intravenous contrast. Multiplanar CT image reconstructions and MIPs were obtained to evaluate the vascular anatomy. RADIATION DOSE REDUCTION: This exam was performed according to the departmental dose-optimization program which includes automated exposure control, adjustment of the mA and/or kV according to patient size and/or use of iterative reconstruction technique. CONTRAST:  75mL OMNIPAQUE IOHEXOL 350 MG/ML SOLN COMPARISON:  Same day chest radiograph and CT chest 02/14/2021 FINDINGS: Cardiovascular: Negative for acute pulmonary embolism. No pericardial effusion. Coronary artery and aortic atherosclerotic calcification. Mediastinum/Nodes: Trachea and esophagus are unremarkable. No thoracic adenopathy. Lungs/Pleura: Lower lobe atelectasis. The lungs are otherwise clear. No pleural effusion or pneumothorax. Upper Abdomen: No acute abnormality. Musculoskeletal: No acute fracture. Review of the MIP images confirms the above findings.  IMPRESSION: 1. Negative for acute pulmonary embolism. 2. No acute abnormality in the chest. Aortic Atherosclerosis (ICD10-I70.0). Electronically Signed   By: Minerva Fester M.D.   On: 03/31/2023 19:24   DG Chest Portable 1 View  Result Date: 03/31/2023 CLINICAL DATA:  Shortness of breath EXAM: PORTABLE CHEST 1 VIEW COMPARISON:  03/27/2021 FINDINGS: Subsegmental atelectasis at the bases. Normal cardiac size. Aortic atherosclerosis. No pneumothorax IMPRESSION: Subsegmental atelectasis at the bases. Electronically Signed   By: Jasmine Pang M.D.   On: 03/31/2023 18:52    Procedures Procedures    Medications Ordered in ED Medications  azithromycin (ZITHROMAX) tablet 500 mg (has no administration in time range)  iohexol (OMNIPAQUE) 350 MG/ML injection 75 mL (75 mLs Intravenous Contrast Given 03/31/23 1847)    ED Course/ Medical Decision Making/ A&P                                 Medical Decision Making Amount and/or Complexity of Data Reviewed Labs: ordered. Radiology: ordered.  Risk Prescription drug management.   HERMILO KYLE is here with shortness of breath.  Differential diagnosis PE versus ACS versus heart failure versus pneumonia/infectious process.  He has a history of what sounds like chronic bronchitis, hypertension high cholesterol.  History of CKD.  Patient is currently on steroids.  Overall we will get CBC BMP troponin BNP COVID and flu test, chest x-ray/PE scan.  He is not having any chest pain and seems unlikely to be ACS.  Per my review and interpretation the labs troponin at baseline kidney function at baseline.  COVID and flu test negative.  White count 15 but has been on steroids.  BNP is unremarkable.  CT scan of the chest shows no evidence of PE or pneumonia.  Overall I do think that this is likely a resolving inflammatory/reactive airway process.  I will extend his steroids for 2 more days and will put him on a Z-Pak.  I have no concern for ACS or other acute  process.  He understands return precautions.  Recommend follow-up with primary care doctor.  Discharged in good condition.  This chart was dictated using voice recognition software.  Despite best efforts to proofread,  errors can  occur which can change the documentation meaning.         Final Clinical Impression(s) / ED Diagnoses Final diagnoses:  Shortness of breath    Rx / DC Orders ED Discharge Orders          Ordered    azithromycin (ZITHROMAX) 250 MG tablet  Daily        03/31/23 1944    predniSONE (DELTASONE) 10 MG tablet  Daily        03/31/23 1944              Virgina Norfolk, DO 03/31/23 1947    Virgina Norfolk, DO 03/31/23 1948

## 2023-04-02 ENCOUNTER — Ambulatory Visit: Payer: PPO | Attending: Adult Health | Admitting: Adult Health

## 2023-04-02 ENCOUNTER — Encounter: Payer: Self-pay | Admitting: Adult Health

## 2023-04-02 VITALS — BP 102/72 | HR 88 | Ht 68.0 in | Wt 203.4 lb

## 2023-04-02 DIAGNOSIS — I5020 Unspecified systolic (congestive) heart failure: Secondary | ICD-10-CM

## 2023-04-02 DIAGNOSIS — I429 Cardiomyopathy, unspecified: Secondary | ICD-10-CM | POA: Diagnosis not present

## 2023-04-02 DIAGNOSIS — Z794 Long term (current) use of insulin: Secondary | ICD-10-CM

## 2023-04-02 DIAGNOSIS — E7800 Pure hypercholesterolemia, unspecified: Secondary | ICD-10-CM

## 2023-04-02 DIAGNOSIS — E119 Type 2 diabetes mellitus without complications: Secondary | ICD-10-CM | POA: Diagnosis not present

## 2023-04-02 DIAGNOSIS — G4733 Obstructive sleep apnea (adult) (pediatric): Secondary | ICD-10-CM | POA: Diagnosis not present

## 2023-04-02 NOTE — Telephone Encounter (Signed)
Left message for patient to schedule a follow up to assess medication response per [facility].

## 2023-04-02 NOTE — Patient Instructions (Signed)
Medication Instructions:  Lasix 20 mg ( Take 1 Tablet Twice Daily For 2 Days) *If you need a refill on your cardiac medications before your next appointment, please call your pharmacy*   Lab Work: No labs If you have labs (blood work) drawn today and your tests are completely normal, you will receive your results only by: MyChart Message (if you have MyChart) OR A paper copy in the mail If you have any lab test that is abnormal or we need to change your treatment, we will call you to review the results.   Testing/Procedures: Your physician has requested that you have an echocardiogram. Echocardiography is a painless test that uses sound waves to create images of your heart. It provides your doctor with information about the size and shape of your heart and how well your heart's chambers and valves are working. This procedure takes approximately one hour. There are no restrictions for this procedure. Please do NOT wear cologne, perfume, aftershave, or lotions (deodorant is allowed). Please arrive 15 minutes prior to your appointment time.  Please note: We ask at that you not bring children with you during ultrasound (echo/ vascular) testing. Due to room size and safety concerns, children are not allowed in the ultrasound rooms during exams. Our front office staff cannot provide observation of children in our lobby area while testing is being conducted. An adult accompanying a patient to their appointment will only be allowed in the ultrasound room at the discretion of the ultrasound technician under special circumstances. We apologize for any inconvenience.    Follow-Up: At [facility], you and your health needs are our priority.  As part of our continuing mission to provide you with exceptional heart care, we have created designated Provider Care Teams.  These Care Teams include your primary Cardiologist (physician) and Advanced Practice Providers (APPs -  Physician Assistants and  Nurse Practitioners) who all work together to provide you with the care you need, when you need it.  We recommend signing up for the patient portal called "MyChart".  Sign up information is provided on this After Visit Summary.  MyChart is used to connect with patients for Virtual Visits (Telemedicine).  Patients are able to view lab/test results, encounter notes, upcoming appointments, etc.  Non-urgent messages can be sent to your provider as well.   To learn more about what you can do with MyChart, go to [email or web address].    Your next appointment:   1 week(s)  Provider:   Joni Reining, DNP, ANP     Other Instructions Measure waist Daily.

## 2023-04-02 NOTE — Progress Notes (Signed)
Cardiology Office Note:  .   Date:  04/02/2023  ID:  [name], DOB April 23, 1951, MRN [id number] PCP: Chilton Greathouse, MD  [facility] Providers Cardiologist:  Thurmon Fair, MD  }   History of Present Illness: .   Donald [name] is a 72 y.o. male  with a hx of nonischemic cardiomyopathy dating back to at least 2013 when he had coronary angiography that showed no evidence of significant obstructive disease.  Most recent LVEF by echo 08/09/2022 was 40%.  Additional medical problems include treated hypertension, type 2 diabetes mellitus, hypercholesterolemia, OSA on CPAP.    On last office visit Dr. Royann Shivers stopped his loop diuretic but can take it as needed for weight over 200 pounds on his home scale.  He was to continue Entresto, beta-blocker and spironolactone, and was not on SGL 2 inhibitors due to side effects.  Unfortunately the patient was seen in [facility] emergency room on 03/31/2019 for due to shortness of breath.  He had also been having chronic cough which has gotten worse.  He was ruled out for COVID, PE, pneumonia, and flu.  His steroid dosing was extended for 2 more days and he was placed on Z-Pak.  He was to follow-up with his primary care provider.  He is on Lasix 20 mg for weight greater than 200 pounds.  He was up on his weight by 3 pounds and did have some lower extremity edema in his ankle.  He he started taking the Lasix for the last 3 to 4 days (but was cutting them in half, taking 10 mg).  He had some improvement in his lower extremity edema and stopped taking him 2 days ago.  His breathing has become worse.   He states he does not have sleep apnea, but he stops breathing at night.  He is now on oxygen via nasal cannula which she states is at 4 L when he wears it.  He denies significant PND or orthopnea but he states when he lays back his breathing does become somewhat worse.  His abdomen has become a little heavier.  He admits to eating a  large bowl of popcorn daily.  Weight today is 203 pounds on our scale but he states that it was 200 pounds on his scale.  He states walking less than 50 feet he is short of breath and has to stop.  When he is at rest he does not have trouble breathing at all.  ROS: As above otherwise negative.  Studies Reviewed: .     Echocardiogram 08/09/2022 1. Left ventricular ejection fraction, by estimation, is 40%. The left  ventricle has moderately decreased function. The left ventricle  demonstrates global hypokinesis. The left ventricular internal cavity size  was mildly dilated. Left ventricular  diastolic parameters are consistent with Grade I diastolic dysfunction  (impaired relaxation). There is abnromal septal motion.   2. Right ventricular systolic function is normal. The right ventricular  size is mildly enlarged. Tricuspid regurgitation signal is inadequate for  assessing PA pressure.   3. The mitral valve is grossly normal. Trivial mitral valve  regurgitation. No evidence of mitral stenosis.   4. The aortic valve is tricuspid. There is mild thickening of the aortic  valve. Aortic valve regurgitation is not visualized. Aortic valve  sclerosis/calcification is present, without any evidence of aortic  stenosis.   5. The inferior vena cava is normal in size with greater than 50%  respiratory variability, suggesting right atrial  pressure of 3 mmHg.     Physical Exam:   VS:  BP 102/72 (BP Location: Left Arm, Patient Position: Sitting, Cuff Size: Large)   Pulse 88   Ht 5\' 8"  (1.727 m)   Wt 203 lb 6.4 oz (92.3 kg)   SpO2 93%   BMI 30.93 kg/m    Wt Readings from Last 3 Encounters:  04/02/23 203 lb 6.4 oz (92.3 kg)  12/13/22 200 lb 9.6 oz (91 kg)  09/29/22 199 lb (90.3 kg)    GEN: Well nourished, well developed in no acute distress NECK: No JVD; No carotid bruits CARDIAC: RRR, occasional irregular beat, no murmurs, rubs, gallops RESPIRATORY:  Clear to auscultation without rales,  wheezing or rhonchi  ABDOMEN: Soft, non-tender, enlarged with some ballottement. EXTREMITIES:  No edema; No deformity   ASSESSMENT AND PLAN: .    Chronic diastolic heart failure: He was taking 10 mg of Lasix daily for a few days when his weight was greater than 200 pounds and he did have some lower extremity edema.  Since that time his weight has returned.  I am uncertain if his dry weight is now 200 pounds we may need to consider lowering it.  He appears volume overloaded in the abdomen with some ballottement on examination.         I have started him on Lasix 20 mg twice daily for 2 days, likely will need to go with third day but he is willing to do this for 2 days only.  I have asked him to please stop eating the salted foods as well.  We will check back with him in 2 days by phone to check his weight.  He is also to measure his abdominal girth.  Do not want to over diurese him.  He will need to have a follow-up BMET.  May need to consider repeating his echo and/or a right heart cath at the discretion of Dr. Royann Shivers.  He remains on Entresto, metoprolol 50 mg twice a day and spironolactone 12.5 mg daily.  2.  History of OSA: He is listed as being on CPAP but he states he is not using the CPAP mask but instead nasal cannula oxygen at 4 L.  He is chronically short of breath with exertion but has noticed worsening over the last few days and has had a cough.  He now denies that the cough an issue when he went to the emergency room.  He coughs very little now.  3.  Insulin-dependent diabetes: Followed by PCP.  4.  Hypercholesterolemia: Currently on rosuvastatin 20 mg daily.  Goal of LDL less than 70.  Review of labs reveals glucose of 99 when being evaluated in ED.     Will see him on close follow-up in 1 week with the BMET.   Signed, Bettey Mare. Liborio Nixon, ANP, AACC

## 2023-04-04 ENCOUNTER — Telehealth: Payer: Self-pay

## 2023-04-04 NOTE — Telephone Encounter (Signed)
Called patient regarding instructions per Joni Reining regarding breathing . Per K. Lawrence to take Lasix 20 mg Twice daily for the next 3 days. Patients wife had understanding of instructions.

## 2023-04-06 ENCOUNTER — Telehealth: Payer: Self-pay

## 2023-04-06 ENCOUNTER — Other Ambulatory Visit (HOSPITAL_COMMUNITY): Payer: Self-pay

## 2023-04-06 NOTE — Telephone Encounter (Signed)
Pharmacy Patient Advocate Encounter   Received notification from CoverMyMeds that prior authorization for Budesonide-Formoterol Fumarate 80-4.5MCG/ACT aerosol is required/requested.   Insurance verification completed.   The patient is insured through [facility] ADVANTAGE/RX ADVANCE .   Per test claim: PA required; PA submitted to above mentioned insurance via CoverMyMeds Key/confirmation #/EOC [id number] Status is pending

## 2023-04-09 NOTE — Telephone Encounter (Signed)
Pharmacy Patient Advocate Encounter  Received notification from [facility] ADVANTAGE/RX ADVANCE that Prior Authorization for Symbicort has been DENIED.  Full denial letter will be uploaded to the media tab. See denial reason below.   PA #/Case ID/Reference #:

## 2023-04-09 NOTE — Telephone Encounter (Signed)
Patient has follow up appointment on 05/18/2023.

## 2023-04-09 NOTE — Telephone Encounter (Signed)
Spoke with pt wife, she reports the weight today is 194.8 lb. He has lost 1 inch in the waist. She reports he can walk to the bathroom and get SOB and it will take about 20 to 30 mins for his breathing to return to normal when last week it would take about 45 min. He did not take furosemide yesterday or today. Aware will forward information to [employer] np to review.

## 2023-04-11 ENCOUNTER — Ambulatory Visit: Payer: PPO | Admitting: Pulmonary Disease

## 2023-04-11 ENCOUNTER — Encounter: Payer: Self-pay | Admitting: Pulmonary Disease

## 2023-04-11 VITALS — BP 112/72 | HR 86 | Temp 98.0°F | Ht 68.0 in | Wt 198.8 lb

## 2023-04-11 DIAGNOSIS — R0609 Other forms of dyspnea: Secondary | ICD-10-CM | POA: Diagnosis not present

## 2023-04-11 MED ORDER — BREZTRI AEROSPHERE 160-9-4.8 MCG/ACT IN AERO: 2.0000 | INHALATION_SPRAY | Freq: Two times a day (BID) | RESPIRATORY_TRACT | Status: DC

## 2023-04-11 NOTE — Patient Instructions (Addendum)
Stop gabapentin - cough better prior to starting and side effects worse/not worth it  Try Breztri 2 puffs twice a day every day, rinse your mouth out with water after every use  The chest imaging is clear.  This is reassuring that the lungs are doing okay.  Will order pulmonary function test for reevaluation, these have been okay in the past.  I sent a message to your cardiology team for consideration of what we discussed  Return to clinic in 3 months or sooner as needed with Dr. Judeth Horn, please send me a message if the inhaler helps and I can prescribe long-term or if things are going well we will try to work you back in clinic to reassess

## 2023-04-11 NOTE — Progress Notes (Signed)
@Patient  ID: Donald [name], male    DOB: [birth year], 72 y.o.   MRN: [id number]  Chief Complaint  Patient presents with   Acute Visit    SOB with cough    Referring provider: Chilton Greathouse, MD  HPI:   72 y.o. man whom we are seeing for acute visit of shortness of breath.  Chronic cough seems better controlled formally seen by Rise Paganini, MD.  Most recent note from him as well as most recent pulmonary note Buelah Manis, NP reviewed.  Long issue with chronic cough.  Tried multiple things.  Was seen 72 y.o. man whom we are visit chiefly with shortness of breath.  They state cough is improved.  Despite this.  Recommendations made for trying gabapentin.  Again cough improved prior to this.  Took it for few days and that made him feel lightheaded woozy not quite himself.  Similar issues in the past.  This was discontinued.  Which I agree with.  He continues with severe shortness of breath.  Today after virtual visit went to ED.  Chest x-ray on my review interpretation is clear.  CTA PE protocol my review interpretation is clear with no evidence of PE.  He is tried Atrovent prescribed by Buelah Manis, NP at last visit.  No improvement.  Through the course of this got a round of azithromycin as well as prednisone.  No improvement with this.  Seen by his cardiology team, NP in the interim.  Placed on Lasix for a few days.  His weight is down about 5 pounds by scales here.  He says at home his weight is down a little bit more.  Regardless, no real improvement in the symptoms despite taking Lasix.  Sounds like they were concerned about fluid accumulation in his abdomen.  We discussed at length with normal chest imaging and prior normal PFTs that lung is likely not the culprit here.  In addition, if lungs were the culprit asthma will be the most likely thing given normal PFTs and normal chest imaging.  However he is not improved with inhalers in the past, short acting bronchodilators with current  symptoms, nor with prednisone or antibiotics.  Discussed further evaluation with PFTs although in the past these been reassuring.  Discussed messaging cardiology team for further evaluation and assistance.  Discussed trying an inhaler just in case.  Questionaires / Pulmonary Flowsheets:   ACT:      No data to display          MMRC:     No data to display          Epworth:      No data to display          Tests:   FENO:  No results found for: "NITRICOXIDE"  PFT:    Latest Ref Rng & Units 02/11/2021   11:56 AM 06/17/2014    2:21 PM  PFT Results  FVC-Pre L 2.81  3.30   FVC-Predicted Pre % 67  75   FVC-Post L 3.04  3.49   FVC-Predicted Post % 73  79   Pre FEV1/FVC % % 73  73   Post FEV1/FCV % % 76  77   FEV1-Pre L 2.05  2.42   FEV1-Predicted Pre % 66  73   FEV1-Post L 2.32  2.69   DLCO uncorrected ml/min/mmHg 19.80  21.18   DLCO UNC% % 80  71   DLCO corrected ml/min/mmHg 19.80    DLCO COR %Predicted % 80  DLVA Predicted % 100  94   TLC L 4.54  5.18   TLC % Predicted % 68  78   RV % Predicted % 78  83     WALK:     03/30/2021    1:12 PM 06/11/2014    5:29 PM 08/18/2011    4:37 PM  SIX MIN WALK  Supplimental Oxygen during Test? (L/min) No No No  Tech Comments: walked at a steady pace with no breaks  test completed/pt's o2 sats drop but he quickly recovers//mmr    Imaging: Personally reviewed and as per EMR and discussion in this note CT Angio Chest PE W and/or Wo Contrast  Result Date: 03/31/2023 CLINICAL DATA:  Shortness of breath. Worsened in the last hour. Cough. EXAM: CT ANGIOGRAPHY CHEST WITH CONTRAST TECHNIQUE: Multidetector CT imaging of the chest was performed using the standard protocol during bolus administration of intravenous contrast. Multiplanar CT image reconstructions and MIPs were obtained to evaluate the vascular anatomy. RADIATION DOSE REDUCTION: This exam was performed according to the departmental dose-optimization program which  includes automated exposure control, adjustment of the mA and/or kV according to patient size and/or use of iterative reconstruction technique. CONTRAST:  75mL OMNIPAQUE IOHEXOL 350 MG/ML SOLN COMPARISON:  Same day chest radiograph and CT chest 02/14/2021 FINDINGS: Cardiovascular: Negative for acute pulmonary embolism. No pericardial effusion. Coronary artery and aortic atherosclerotic calcification. Mediastinum/Nodes: Trachea and esophagus are unremarkable. No thoracic adenopathy. Lungs/Pleura: Lower lobe atelectasis. The lungs are otherwise clear. No pleural effusion or pneumothorax. Upper Abdomen: No acute abnormality. Musculoskeletal: No acute fracture. Review of the MIP images confirms the above findings. IMPRESSION: 1. Negative for acute pulmonary embolism. 2. No acute abnormality in the chest. Aortic Atherosclerosis (ICD10-I70.0). Electronically Signed   By: Minerva Fester M.D.   On: 03/31/2023 19:24   DG Chest Portable 1 View  Result Date: 03/31/2023 CLINICAL DATA:  Shortness of breath EXAM: PORTABLE CHEST 1 VIEW COMPARISON:  03/27/2021 FINDINGS: Subsegmental atelectasis at the bases. Normal cardiac size. Aortic atherosclerosis. No pneumothorax IMPRESSION: Subsegmental atelectasis at the bases. Electronically Signed   By: Jasmine Pang M.D.   On: 03/31/2023 18:52    Lab Results: Personally reviewed, no significant anemia recently CBC    Component Value Date/Time   WBC 15.0 (H) 03/31/2023 1743   RBC 5.30 03/31/2023 1743   HGB 12.7 (L) 03/31/2023 1743   HGB 13.4 10/18/2020 1033   HCT 39.8 03/31/2023 1743   HCT 42.5 10/18/2020 1033   PLT 346 03/31/2023 1743   PLT 309 10/18/2020 1033   MCV 75.1 (L) 03/31/2023 1743   MCV 82 10/18/2020 1033   MCH 24.0 (L) 03/31/2023 1743   MCHC 31.9 03/31/2023 1743   RDW 19.7 (H) 03/31/2023 1743   RDW 16.7 (H) 10/18/2020 1033   LYMPHSABS 1.9 10/18/2020 1033   MONOABS 0.7 12/25/2018 1500   EOSABS 0.4 10/18/2020 1033   BASOSABS 0.1 10/18/2020 1033     BMET    Component Value Date/Time   NA 139 03/31/2023 1743   NA 140 12/06/2022 1436   K 3.4 (L) 03/31/2023 1743   CL 107 03/31/2023 1743   CO2 20 (L) 03/31/2023 1743   GLUCOSE 99 03/31/2023 1743   BUN 61 (H) 03/31/2023 1743   BUN 71 (H) 12/06/2022 1436   CREATININE 1.27 (H) 03/31/2023 1743   CALCIUM 9.8 03/31/2023 1743   GFRNONAA >60 03/31/2023 1743   GFRAA >60 12/27/2018 0653    BNP    Component Value Date/Time  BNP 26.0 03/31/2023 1743    ProBNP    Component Value Date/Time   PROBNP 46 12/06/2022 1436   PROBNP 7.0 03/30/2021 1147    Specialty Problems       Pulmonary Problems   Seasonal allergic rhinitis due to pollen   SOB (shortness of breath)    CXR 07/2011:  Clear.  Echo 07/2011:  Mild diffuse HK with EF 50-55%.  Otherwise ok.  PFT"s 08/2011:  Totally normal.       Nocturnal hypoxemia    ONO 08/2011:  Low sat 82%, 8 hrs less than or equal to 88%. Ambulatory ox 08/2011:  desat 86-88%, but recovered very quickly      OSA (obstructive sleep apnea)    NPSG 10/2011:  AHI 15/hr with desat to 80%.  Spent 194 min less than 88% during the night.       Pneumonia, interstitial (HCC)   Mild intermittent asthma without complication   Hypoxemia requiring supplemental oxygen   OSA on CPAP   Pneumonitis   Chronic cough   Chronic sinusitis with recurrent bronchitis   Dyspnea    Allergies  Allergen Reactions   Ciprofloxacin     Tendons problem in shoulder Other reaction(s): Arthralgias (intolerance), Myalgias (intolerance)   Empagliflozin Anaphylaxis    Dehydration   Levofloxacin     Other reaction(s): Arthralgias (intolerance), Myalgias (intolerance)   Aricept [Donepezil Hcl] Nausea Only   Bactrim [Sulfamethoxazole-Trimethoprim] Hives   Levofloxacin Other (See Comments)    Muscle tighten up in left and right calf   Misc. Sulfonamide Containing Compounds Rash    Immunization History  Administered Date(s) Administered   Influenza Split 02/05/2014    Influenza, Quadrivalent, Recombinant, Inj, Pf 01/16/2018, 03/06/2019, 02/25/2020, 02/23/2021   Influenza-Unspecified 03/09/2009, 02/08/2010, 02/22/2011, 01/15/2012, 03/05/2013, 01/15/2014, 02/05/2014, 03/23/2015, 03/14/2016, 03/16/2017   Pneumococcal Conjugate-13 04/06/2014, 08/29/2017   Pneumococcal Polysaccharide-23 05/08/2006   Pneumococcal-Unspecified 03/08/2014   Tdap 07/10/2008   Zoster, Live 01/30/2014    Past Medical History:  Diagnosis Date   Allergic rhinitis, seasonal    Asthma    Bronchitis    CHF (congestive heart failure) (HCC)    Chronic kidney disease, stage I    Cognitive impairment    Effects Short Term Memory   collar bone    dislocation left side 05/2014   Degenerative joint disease of knee    bilateral   Diabetes mellitus    Type 2   Dysrhythmia    PVCs   Fatty liver    GERD (gastroesophageal reflux disease)    Headache    Heachaces r/t eyes   Heart murmur    history of   History of acute prostatitis    History of hematuria    History of hiatal hernia    H/O   Hyperlipidemia    Hypertension    IgA nephropathy    OSA on CPAP    Pneumonia    Sinusitis     Tobacco History: Social History   Tobacco Use  Smoking Status Never  Smokeless Tobacco Never  Tobacco Comments   Second hand smoke for 26 years per pt   Counseling given: Not Answered Tobacco comments: Second hand smoke for 26 years per pt   Continue to not smoke  Outpatient Encounter Medications as of 04/11/2023  Medication Sig   Budeson-Glycopyrrol-Formoterol (BREZTRI AEROSPHERE) 160-9-4.8 MCG/ACT AERO Inhale 2 puffs into the lungs in the morning and at bedtime.   Continuous Blood Gluc Sensor (FREESTYLE LIBRE 14 DAY SENSOR) MISC  DULoxetine (CYMBALTA) 30 MG capsule Take 90 mg by mouth daily.   finasteride (PROSCAR) 5 MG tablet Take 5 mg by mouth daily.   furosemide (LASIX) 20 MG tablet Take 1 tablet (20 mg total) by mouth as needed. Take 1 tablet (20 mg) daily if weight is greater  than 200.   gabapentin (NEURONTIN) 100 MG capsule Take 1 capsule (100 mg total) by mouth 3 (three) times daily.   HYDROcodone-acetaminophen (NORCO/VICODIN) 5-325 MG tablet Take 1-2 tablets by mouth every 4 (four) hours as needed for moderate pain ((score 4 to 6)).   inclisiran (LEQVIO) 284 MG/1.5ML SOSY injection as directed Subcutaneous   insulin degludec (TRESIBA FLEXTOUCH) 100 UNIT/ML FlexTouch Pen Inject 100 Units into the skin daily.   insulin lispro (HUMALOG KWIKPEN) 200 UNIT/ML KwikPen Inject 40-60 Units into the skin as needed.   Insulin Pen Needle (UNIFINE PENTIPS PLUS) 32G X 4 MM MISC USE WITH TRESIBA DAILY   ipratropium (ATROVENT HFA) 17 MCG/ACT inhaler Inhale 1 puff into the lungs 3 (three) times daily.   memantine (NAMENDA) 10 MG tablet Take 1 tablet (10 mg total) by mouth 2 (two) times daily.   metFORMIN (GLUCOPHAGE) 1000 MG tablet Take 1 tablet (1,000 mg total) by mouth 2 (two) times daily with a meal. Restart on 8/22   metoprolol succinate (TOPROL-XL) 50 MG 24 hr tablet Take 50 mg by mouth 2 (two) times daily. Take with or immediately following a meal.   naproxen sodium (ALEVE) 220 MG tablet Take 220 mg by mouth daily as needed.   pantoprazole (PROTONIX) 40 MG tablet Take 1 tablet (40 mg total) by mouth 2 (two) times daily. (Patient taking differently: Take 40 mg by mouth daily.)   rosuvastatin (CRESTOR) 20 MG tablet TAKE ONE TABLET BY MOUTH ONE TIME DAILY   sacubitril-valsartan (ENTRESTO) 97-103 MG Take 1 tablet by mouth 2 (two) times daily.   silodosin (RAPAFLO) 8 MG CAPS capsule Take 8 mg by mouth daily.   spironolactone (ALDACTONE) 25 MG tablet Take 0.5 tablets (12.5 mg total) by mouth daily.   [DISCONTINUED] budesonide-formoterol (SYMBICORT) 80-4.5 MCG/ACT inhaler Inhale 2 puffs into the lungs in the morning and at bedtime. (Patient not taking: Reported on 04/02/2023)   [DISCONTINUED] Fluticasone-Umeclidin-Vilant (TRELEGY ELLIPTA) 100-62.5-25 MCG/ACT AEPB 1 puff. Rinse mouth  afterwards Inhalation Once a day for 30 days (Patient not taking: Reported on 04/11/2023)   No facility-administered encounter medications on file as of 04/11/2023.     Review of Systems  Review of Systems  Denies chest pain with exertion.  No orthopnea or PND.  Comprehensive review of systems otherwise negative. Physical Exam  BP 112/72 (BP Location: Left Arm, Patient Position: Sitting, Cuff Size: Large)   Pulse 86   Temp 98 F (36.7 C) (Oral)   Ht 5\' 8"  (1.727 m)   Wt 198 lb 12.8 oz (90.2 kg)   SpO2 97%   BMI 30.23 kg/m   Wt Readings from Last 5 Encounters:  04/11/23 198 lb 12.8 oz (90.2 kg)  04/02/23 203 lb 6.4 oz (92.3 kg)  12/13/22 200 lb 9.6 oz (91 kg)  09/29/22 199 lb (90.3 kg)  09/27/22 200 lb 9.6 oz (91 kg)    BMI Readings from Last 5 Encounters:  04/11/23 30.23 kg/m  04/02/23 30.93 kg/m  12/13/22 30.50 kg/m  09/29/22 30.26 kg/m  09/27/22 30.50 kg/m     Physical Exam General: Sitting in chair, no acute distress Eyes: EOMI, no icterus Neck: Supple, no JVP Cardiovascular: Warm, no edema noted Pulmonary:  Clear, normal work of breathing Abdomen: Nondistended, bowel sounds present MSK: No synovitis, no joint effusion Neuro: Normal gait, no weakness Psych: Normal mood, full affect   Assessment & Plan:   Dyspnea on exertion: Relatively sudden onset.  Chest imaging including PE protocol CTA clear and no evidence of PE.  No improvement with asthma therapies such as prednisone and inhalers.  No improvement with antibiotics.  Prior PFTs in the past reassuring.  Low suspicion for pulmonary driver.  Try Breztri 2 puff twice a day just in case new diagnosis of asthma.  Again, lack of response to steroids makes me think this is less likely.  Encouraged him to continue Lasix as prescribed by cardiology.  Will message cardiologist to evaluate sooner TTE versus right heart cath to assess for development of pulmonary hypertension in the setting of his reduced EF.   Notably he appears euvolemic on exam today.  Recent BNP within normal limits.  Repeat PFTs ordered as well.  Given clear parenchyma on recent CT scan suspect this will be normal.   Return in about 3 months (around 07/10/2023) for f/u Dr. Judeth Horn.   Karren Burly, MD 04/11/2023   This appointment required 41 minutes of patient care (this includes precharting, chart review, review of results, face-to-face care, etc.).

## 2023-04-12 NOTE — Progress Notes (Signed)
Cardiology Office Note:  .   Date:  04/13/2023  ID:  [name], DOB 07/03/1950, MRN [id number] PCP: Chilton Greathouse, MD  [facility] Providers Cardiologist:  Thurmon Fair, MD 1}   }   History of Present Illness: .   Donald [name] is a 72 y.o. male with a hx of nonischemic cardiomyopathy dating back to at least 2013 when he had coronary angiography that showed no evidence of significant obstructive disease.  Most recent LVEF by echo 08/09/2022 was 40%.  Additional medical problems include treated hypertension, type 2 diabetes mellitus, hypercholesterolemia, OSA on CPAP.  He is now on oxygen via nasal cannula which is 4 L when he wears it.  His weight has consistently been around 200 pounds but he got short of breath walking less than 50 feet.  Review of his echocardiogram in April 24 revealed an EF of 40% grade 1 diastolic dysfunction.  There is no significant valvular abnormalities.  When I saw him last I increased his Lasix to 20 mg twice a day for 3 days, he was to weigh himself daily and measure his waist.  He was very full in the abdomen.  He continued to do this after 2 days as his weight had only come down a few pounds and his breathing continues to be an issue.  He was seen by Dr. Vilma Meckel, pulmonology, with shortness of breath and chronic cough.  He had been placed on Atrovent inhaler in the ED prior to being seen.  He had not improved with inhalers.  It was recommended that the patient have a right heart cath to assess for pulmonary hypertension in the setting of reduced EF, as he had had normal PFTs and normal chest imaging.  He was noted to have nocturnal hypoxemia he was to return CPAP.  He felt that the breathing status was not related to a pulmonary issue.  He was to continue Lasix as directed.  The patient continues to have dyspnea at rest and on exertion.  He is sleeping a lot during the day and using his CPAP with oxygen when he does so.  He also  sleeps a lot at night.  His weight is coming back on.  He continues on Lasix 20 mg twice daily but is now feeling kind of dried out and thirsty.  I had already discussed with Dr. Royann Shivers about right heart catheterization scheduling and he is in agreement with moving forward with this procedure to evaluate for pulmonary hypertension.  Echocardiogram is still pending in January.   ROS: As above otherwise negative.  Studies Reviewed: Marland Kitchen   EKG Interpretation Date/Time:  Friday April 13 2023 08:32:58 EST Ventricular Rate:  80 PR Interval:  152 QRS Duration:  106 QT Interval:  378 QTC Calculation: 435 R Axis:   -9  Text Interpretation: Normal sinus rhythm Nonspecific ST and T wave abnormality When compared with ECG of 31-Mar-2023 17:30, PREVIOUS ECG IS PRESENT Confirmed by Joni Reining [phone]) on 04/13/2023 9:03:31 AM     Physical Exam:   VS:  BP 106/68   Pulse 80   Ht 5\' 8"  (1.727 m)   Wt 199 lb (90.3 kg)   SpO2 91%   BMI 30.26 kg/m    Wt Readings from Last 3 Encounters:  04/13/23 199 lb (90.3 kg)  04/11/23 198 lb 12.8 oz (90.2 kg)  04/02/23 203 lb 6.4 oz (92.3 kg)    GEN: Well nourished, well developed in no acute distress, NECK: No  JVD; No carotid bruits CARDIAC: RRR, no murmurs, rubs, gallops RESPIRATORY:  Clear to auscultation without rales, wheezing or rhonchi short of breath when speaking. ABDOMEN: Soft, non-tender, non-distended EXTREMITIES:  No edema; No deformity   ASSESSMENT AND PLAN: .    Nonischemic cardiomyopathy: Most recent echocardiogram April 2024 with a EF of 40%.  On last office visit he appeared volume overloaded and I increased his Lasix to 20 mg twice daily.  He did lose 6 pounds.  Breathing status did not improve.  He is now feeling very dehydrated and Thursday.  I will decrease his Lasix to 20 mg daily for now as he has been able to maintain his weight on low-sodium diet limiting popcorn which she was eating a lot of that was highly salted.  He will  continue Entresto 97/103 mg twice a day.  Spironolactone 12.5 mg daily.   2.  Pulmonary hypertension: Has been followed by Dr. Judeth Horn pulmonologist who does not find any significant lung disease causing his dyspnea.  He is getting scheduled for a right heart cath which will be completed by Dr. Herbie Baltimore on Tuesday, December 10 at 10:30 AM.  I reviewed the procedure, they verbalized understanding and are willing to proceed.  Informed Consent   Shared Decision Making/Informed Consent{ All outpatient stress tests require an informed consent ([id number]) ATTESTATION ORDER       :[id number]} The risks [stroke (1 in 1000), death (1 in 1000), kidney failure [usually temporary] (1 in 500), bleeding (1 in 200), allergic reaction [possibly serious] (1 in 200)], benefits (diagnostic support and management of coronary artery disease) and alternatives of a cardiac catheterization were discussed in detail with Mr. Cogan and he is willing to proceed.     3.  Hypercholesterolemia: He remains on rosuvastatin 20 mg daily.  Labs are followed by PCP.    Signed, Bettey Mare. Liborio Nixon, ANP, AACC

## 2023-04-12 NOTE — H&P (View-Only) (Signed)
Cardiology Office Note:  .   Date:  04/13/2023  ID:  Donald [name], DOB 1950/10/16, MRN [id number] PCP: Donald Greathouse, MD  [facility] Providers Cardiologist:  Donald Fair, MD 1}   }   History of Present Illness: .   Donald [name] is a 72 y.o. male with a hx of nonischemic cardiomyopathy dating back to at least 2013 when he had coronary angiography that showed no evidence of significant obstructive disease.  Most recent LVEF by echo 08/09/2022 was 40%.  Additional medical problems include treated hypertension, type 2 diabetes mellitus, hypercholesterolemia, OSA on CPAP.  He is now on oxygen via nasal cannula which is 4 L when he wears it.  His weight has consistently been around 200 pounds but he got short of breath walking less than 50 feet.  Review of his echocardiogram in April 24 revealed an EF of 40% grade 1 diastolic dysfunction.  There is no significant valvular abnormalities.  When I saw him last I increased his Lasix to 20 mg twice a day for 3 days, he was to weigh himself daily and measure his waist.  He was very full in the abdomen.  He continued to do this after 2 days as his weight had only come down a few pounds and his breathing continues to be an issue.  He was seen by Dr. Vilma [name], pulmonology, with shortness of breath and chronic cough.  He had been placed on Atrovent inhaler in the ED prior to being seen.  He had not improved with inhalers.  It was recommended that the patient have a right heart cath to assess for pulmonary hypertension in the setting of reduced EF, as he had had normal PFTs and normal chest imaging.  He was noted to have nocturnal hypoxemia he was to return CPAP.  He felt that the breathing status was not related to a pulmonary issue.  He was to continue Lasix as directed.  The patient continues to have dyspnea at rest and on exertion.  He is sleeping a lot during the day and using his CPAP with oxygen when he does so.  He also  sleeps a lot at night.  His weight is coming back on.  He continues on Lasix 20 mg twice daily but is now feeling kind of dried out and thirsty.  I had already discussed with Dr. Royann [name] about right heart catheterization scheduling and he is in agreement with moving forward with this procedure to evaluate for pulmonary hypertension.  Echocardiogram is still pending in January.   ROS: As above otherwise negative.  Studies Reviewed: Marland Kitchen   EKG Interpretation Date/Time:  Friday April 13 2023 08:32:58 EST Ventricular Rate:  80 PR Interval:  152 QRS Duration:  106 QT Interval:  378 QTC Calculation: 435 R Axis:   -9  Text Interpretation: Normal sinus rhythm Nonspecific ST and T wave abnormality When compared with ECG of 31-Mar-2023 17:30, PREVIOUS ECG IS PRESENT Confirmed by Donald [name] [phone]) on 04/13/2023 9:03:31 AM     Physical Exam:   VS:  BP 106/68   Pulse 80   Ht 5\' 8"  (1.727 m)   Wt 199 lb (90.3 kg)   SpO2 91%   BMI 30.26 kg/m    Wt Readings from Last 3 Encounters:  04/13/23 199 lb (90.3 kg)  04/11/23 198 lb 12.8 oz (90.2 kg)  04/02/23 203 lb 6.4 oz (92.3 kg)    GEN: Well nourished, well developed in no acute distress, NECK: No  JVD; No carotid bruits CARDIAC: RRR, no murmurs, rubs, gallops RESPIRATORY:  Clear to auscultation without rales, wheezing or rhonchi short of breath when speaking. ABDOMEN: Soft, non-tender, non-distended EXTREMITIES:  No edema; No deformity   ASSESSMENT AND PLAN: .    Nonischemic cardiomyopathy: Most recent echocardiogram April 2024 with a EF of 40%.  On last office visit he appeared volume overloaded and I increased his Lasix to 20 mg twice daily.  He did lose 6 pounds.  Breathing status did not improve.  He is now feeling very dehydrated and Thursday.  I will decrease his Lasix to 20 mg daily for now as he has been able to maintain his weight on low-sodium diet limiting popcorn which she was eating a lot of that was highly salted.  He will  continue Entresto 97/103 mg twice a day.  Spironolactone 12.5 mg daily.   2.  Pulmonary hypertension: Has been followed by Dr. Judeth Horn pulmonologist who does not find any significant lung disease causing his dyspnea.  He is getting scheduled for a right heart cath which will be completed by Dr. Herbie [name] on Tuesday, December 10 at 10:30 AM.  I reviewed the procedure, they verbalized understanding and are willing to proceed.  Informed Consent   Shared Decision Making/Informed Consent The risks [stroke (1 in 1000), death (1 in 1000), kidney failure [usually temporary] (1 in 500), bleeding (1 in 200), allergic reaction [possibly serious] (1 in 200)], benefits (diagnostic support and management of coronary artery disease) and alternatives of a cardiac catheterization were discussed in detail with Mr. Hitt and he is willing to proceed.     3.  Hypercholesterolemia: He remains on rosuvastatin 20 mg daily.  Labs are followed by PCP.    Signed, Bettey [name]. Donald [name], ANP, AACC

## 2023-04-13 ENCOUNTER — Encounter: Payer: Self-pay | Admitting: Adult Health

## 2023-04-13 ENCOUNTER — Other Ambulatory Visit: Payer: Self-pay | Admitting: Adult Health

## 2023-04-13 ENCOUNTER — Ambulatory Visit: Payer: PPO | Attending: Adult Health | Admitting: Adult Health

## 2023-04-13 VITALS — BP 106/68 | HR 80 | Ht 68.0 in | Wt 199.0 lb

## 2023-04-13 DIAGNOSIS — I5022 Chronic systolic (congestive) heart failure: Secondary | ICD-10-CM

## 2023-04-13 DIAGNOSIS — I272 Pulmonary hypertension, unspecified: Secondary | ICD-10-CM | POA: Diagnosis not present

## 2023-04-13 DIAGNOSIS — G4733 Obstructive sleep apnea (adult) (pediatric): Secondary | ICD-10-CM

## 2023-04-13 DIAGNOSIS — I428 Other cardiomyopathies: Secondary | ICD-10-CM | POA: Diagnosis not present

## 2023-04-13 DIAGNOSIS — R0602 Shortness of breath: Secondary | ICD-10-CM | POA: Diagnosis not present

## 2023-04-13 DIAGNOSIS — G4734 Idiopathic sleep related nonobstructive alveolar hypoventilation: Secondary | ICD-10-CM | POA: Diagnosis not present

## 2023-04-13 DIAGNOSIS — I10 Essential (primary) hypertension: Secondary | ICD-10-CM

## 2023-04-13 NOTE — Patient Instructions (Signed)
Medication Instructions:  Decrease Lasix 20 mg ( Take 1 Tablet Daily). *If you need a refill on your cardiac medications before your next appointment, please call your pharmacy*   Lab Work: BMET, CBC Monday If you have labs (blood work) drawn today and your tests are completely normal, you will receive your results only by: MyChart Message (if you have MyChart) OR A paper copy in the mail If you have any lab test that is abnormal or we need to change your treatment, we will call you to review the results.   Testing/Procedures:       Cardiac/Peripheral Catheterization   You are scheduled for a Cardiac Catheterization on Tuesday, December 10 with Dr. Bryan Lemma.  1. Please arrive at the [facility] (Main Entrance A) at [facility]: [address] [place], [place] [address] at 8:30 AM (This time is 2 hour(s) before your procedure to ensure your preparation).   Free valet parking service is available. You will check in at ADMITTING. The support person will be asked to wait in the waiting room.  It is OK to have someone drop you off and come back when you are ready to be discharged.        Special note: Every effort is made to have your procedure done on time. Please understand that emergencies sometimes delay scheduled procedures.  2. Diet: Do not eat solid foods after midnight.  You may have clear liquids until 5 AM the day of the procedure.  3. Labs: You will need to have blood drawn on Monday, December 8 at [facility] [address], [place]  Open: 8am - 5pm (Lunch 12:30 - 1:30)   Phone: [phone]. You do not need to be fasting.  4. Medication instructions in preparation for your procedure:   Contrast Allergy: No   Current Outpatient Medications (Endocrine & Metabolic):    insulin degludec (TRESIBA FLEXTOUCH) 100 UNIT/ML FlexTouch Pen, Inject 100 Units into the skin daily.   insulin lispro (HUMALOG KWIKPEN) 200 UNIT/ML KwikPen, Inject 40-60 Units  into the skin as needed.   metFORMIN (GLUCOPHAGE) 1000 MG tablet, Take 1 tablet (1,000 mg total) by mouth 2 (two) times daily with a meal. Restart on 8/22  Current Outpatient Medications (Cardiovascular):    furosemide (LASIX) 20 MG tablet, Take 1 tablet (20 mg total) by mouth as needed. Take 1 tablet (20 mg) daily if weight is greater than 200. (Patient taking differently: Take 20 mg by mouth daily. Take 1 Tablet Daily)   inclisiran (LEQVIO) 284 MG/1.5ML SOSY injection, as directed Subcutaneous   metoprolol succinate (TOPROL-XL) 50 MG 24 hr tablet, Take 50 mg by mouth 2 (two) times daily. Take with or immediately following a meal.   rosuvastatin (CRESTOR) 20 MG tablet, TAKE ONE TABLET BY MOUTH ONE TIME DAILY   sacubitril-valsartan (ENTRESTO) 97-103 MG, Take 1 tablet by mouth 2 (two) times daily.   spironolactone (ALDACTONE) 25 MG tablet, Take 0.5 tablets (12.5 mg total) by mouth daily.  Current Outpatient Medications (Respiratory):    Budeson-Glycopyrrol-Formoterol (BREZTRI AEROSPHERE) 160-9-4.8 MCG/ACT AERO, Inhale 2 puffs into the lungs in the morning and at bedtime.   ipratropium (ATROVENT HFA) 17 MCG/ACT inhaler, Inhale 1 puff into the lungs 3 (three) times daily. (Patient not taking: Reported on 04/13/2023)  Current Outpatient Medications (Analgesics):    HYDROcodone-acetaminophen (NORCO/VICODIN) 5-325 MG tablet, Take 1-2 tablets by mouth every 4 (four) hours as needed for moderate pain ((score 4 to 6)).   naproxen sodium (ALEVE) 220  MG tablet, Take 220 mg by mouth daily as needed.   Current Outpatient Medications (Other):    Continuous Blood Gluc Sensor (FREESTYLE LIBRE 14 DAY SENSOR) MISC,    DULoxetine (CYMBALTA) 30 MG capsule, Take 90 mg by mouth daily.   finasteride (PROSCAR) 5 MG tablet, Take 5 mg by mouth daily.   Insulin Pen Needle (UNIFINE PENTIPS PLUS) 32G X 4 MM MISC, USE WITH TRESIBA DAILY   memantine (NAMENDA) 10 MG tablet, Take 1 tablet (10 mg total) by mouth 2 (two) times  daily.   pantoprazole (PROTONIX) 40 MG tablet, Take 1 tablet (40 mg total) by mouth 2 (two) times daily. (Patient taking differently: Take 40 mg by mouth daily.)   silodosin (RAPAFLO) 8 MG CAPS capsule, Take 8 mg by mouth daily.   gabapentin (NEURONTIN) 100 MG capsule, Take 1 capsule (100 mg total) by mouth 3 (three) times daily. (Patient not taking: Reported on 04/13/2023) *For reference purposes while preparing patient instructions.   Delete this med list prior to printing instructions for patient.*    Stop taking, Furosemide  Tuesday, December 10,  Take only 50 units of insulin the night before your procedure. Do not take any insulin on the day of the procedure.  Do not take Diabetes Med Glucophage (Metformin) on the day of the procedure and HOLD 48 HOURS AFTER THE PROCEDURE.  On the morning of your procedure, take Aspirin 81 mg and any morning medicines NOT listed above.  You may use sips of water.  5. Plan to go home the same day, you will only stay overnight if medically necessary. 6. You MUST have a responsible adult to drive you home. 7. An adult MUST be with you the first 24 hours after you arrive home. 8. Bring a current list of your medications, and the last time and date medication taken. 9. Bring ID and current insurance cards. 10.Please wear clothes that are easy to get on and off and wear slip-on shoes.  Thank you for allowing [place] to care for you!   -- [facility]    Follow-Up: At [facility], you and your health needs are our priority.  As part of our continuing mission to provide you with exceptional heart care, we have created designated Provider Care Teams.  These Care Teams include your primary Cardiologist (physician) and Advanced Practice Providers (APPs -  Physician Assistants and Nurse Practitioners) who all work together to provide you with the care you need, when you need it.  We recommend signing up for the patient  portal called "MyChart".  Sign up information is provided on this After Visit Summary.  MyChart is used to connect with patients for Virtual Visits (Telemedicine).  Patients are able to view lab/test results, encounter notes, upcoming appointments, etc.  Non-urgent messages can be sent to your provider as well.   To learn more about what you can do with MyChart, go to [email or web address].    Your next appointment:   2 week(s) (Post Cath)  Provider:   Joni Reining, DNP, ANP

## 2023-04-16 ENCOUNTER — Other Ambulatory Visit: Payer: Self-pay

## 2023-04-16 ENCOUNTER — Telehealth: Payer: Self-pay | Admitting: Cardiovascular Disease

## 2023-04-16 ENCOUNTER — Other Ambulatory Visit (HOSPITAL_COMMUNITY): Payer: Self-pay

## 2023-04-16 ENCOUNTER — Telehealth: Payer: Self-pay | Admitting: *Deleted

## 2023-04-16 DIAGNOSIS — I428 Other cardiomyopathies: Secondary | ICD-10-CM | POA: Diagnosis not present

## 2023-04-16 DIAGNOSIS — I5022 Chronic systolic (congestive) heart failure: Secondary | ICD-10-CM | POA: Diagnosis not present

## 2023-04-16 DIAGNOSIS — G4734 Idiopathic sleep related nonobstructive alveolar hypoventilation: Secondary | ICD-10-CM | POA: Diagnosis not present

## 2023-04-16 DIAGNOSIS — G4733 Obstructive sleep apnea (adult) (pediatric): Secondary | ICD-10-CM | POA: Diagnosis not present

## 2023-04-16 DIAGNOSIS — I272 Pulmonary hypertension, unspecified: Secondary | ICD-10-CM | POA: Diagnosis not present

## 2023-04-16 DIAGNOSIS — I10 Essential (primary) hypertension: Secondary | ICD-10-CM | POA: Diagnosis not present

## 2023-04-16 DIAGNOSIS — R0602 Shortness of breath: Secondary | ICD-10-CM | POA: Diagnosis not present

## 2023-04-16 LAB — CBC
Hematocrit: 38.4 % (ref 37.5–51.0)
Hemoglobin: 12 g/dL — ABNORMAL LOW (ref 13.0–17.7)
MCH: 23.7 pg — ABNORMAL LOW (ref 26.6–33.0)
MCHC: 31.3 g/dL — ABNORMAL LOW (ref 31.5–35.7)
MCV: 76 fL — ABNORMAL LOW (ref 79–97)
Platelets: 283 10*3/uL (ref 150–450)
RBC: 5.07 x10E6/uL (ref 4.14–5.80)
RDW: 20.8 % — ABNORMAL HIGH (ref 11.6–15.4)
WBC: 7.8 10*3/uL (ref 3.4–10.8)

## 2023-04-16 LAB — BASIC METABOLIC PANEL: EGFR: 46

## 2023-04-16 NOTE — Telephone Encounter (Signed)
Right Heart Cath scheduled at [facility] for: Tuesday April 17, 2023 10:30 AM Arrival time [facility] Main Entrance A at: 8:30 AM  Nothing to eat after midnight prior to procedure, clear liquids until 5 AM day of procedure.  Medication instructions: -Hold:  Insulin/Tresiba/Metformin-AM of procedure-1/2 usual Insulin dose HS prior to procedure  Lasix/Spironolactone-AM of procedure -Other usual morning medications can be taken with sips of water.  Plan to go home the same day, you will only stay overnight if medically necessary.  You must have responsible adult to drive you home.  Someone must be with you the first 24 hours after you arrive home.  Reviewed procedure instructions with patient's wife, Sedalia Muta, at patient's request

## 2023-04-16 NOTE — Telephone Encounter (Signed)
Spoke to husbands wife in regards to clarifying that he does need to [place] an aspirin 81 mg the morning of his procedure.

## 2023-04-16 NOTE — Telephone Encounter (Signed)
Pt c/o medication issue:  1. Name of Medication: Aspirin  2. How are you currently taking this medication (dosage and times per day)? Pt is not taking   3. Are you having a reaction (difficulty breathing--STAT)? No   4. What is your medication issue? Pt's wife states someone called her this morning from the hospital about husbands procedure in the morning and told her for her husband to take his aspirin. Wife is confused because pt doe snot take aspirin. Please advise

## 2023-04-17 ENCOUNTER — Ambulatory Visit (HOSPITAL_COMMUNITY)
Admission: RE | Admit: 2023-04-17 | Discharge: 2023-04-17 | Disposition: A | Discharge status: Home / Self Care | Payer: PPO | Attending: Cardiology | Admitting: Cardiology

## 2023-04-17 ENCOUNTER — Encounter (HOSPITAL_COMMUNITY)
Admission: RE | Disposition: A | Discharge status: Home / Self Care | Payer: Self-pay | Source: Home / Self Care | Attending: Cardiology

## 2023-04-17 ENCOUNTER — Other Ambulatory Visit: Payer: Self-pay | Admitting: Cardiovascular Disease

## 2023-04-17 ENCOUNTER — Encounter: Payer: Self-pay | Admitting: Pulmonary Disease

## 2023-04-17 ENCOUNTER — Encounter (HOSPITAL_COMMUNITY): Payer: Self-pay | Admitting: Cardiology

## 2023-04-17 ENCOUNTER — Other Ambulatory Visit: Payer: Self-pay

## 2023-04-17 DIAGNOSIS — E7800 Pure hypercholesterolemia, unspecified: Secondary | ICD-10-CM | POA: Insufficient documentation

## 2023-04-17 DIAGNOSIS — E119 Type 2 diabetes mellitus without complications: Secondary | ICD-10-CM | POA: Diagnosis not present

## 2023-04-17 DIAGNOSIS — G4733 Obstructive sleep apnea (adult) (pediatric): Secondary | ICD-10-CM | POA: Insufficient documentation

## 2023-04-17 DIAGNOSIS — I272 Pulmonary hypertension, unspecified: Secondary | ICD-10-CM | POA: Diagnosis not present

## 2023-04-17 DIAGNOSIS — I428 Other cardiomyopathies: Secondary | ICD-10-CM | POA: Diagnosis not present

## 2023-04-17 DIAGNOSIS — I10 Essential (primary) hypertension: Secondary | ICD-10-CM | POA: Diagnosis not present

## 2023-04-17 DIAGNOSIS — R0609 Other forms of dyspnea: Secondary | ICD-10-CM | POA: Insufficient documentation

## 2023-04-17 DIAGNOSIS — Z79899 Other long term (current) drug therapy: Secondary | ICD-10-CM | POA: Insufficient documentation

## 2023-04-17 DIAGNOSIS — Z9981 Dependence on supplemental oxygen: Secondary | ICD-10-CM | POA: Insufficient documentation

## 2023-04-17 HISTORY — PX: RIGHT HEART CATH: CATH118263

## 2023-04-17 LAB — POCT I-STAT EG7
Acid-base deficit: 2 mmol/L (ref 0.0–2.0)
Acid-base deficit: 3 mmol/L — ABNORMAL HIGH (ref 0.0–2.0)
Bicarbonate: 22.6 mmol/L (ref 20.0–28.0)
Bicarbonate: 24.3 mmol/L (ref 20.0–28.0)
Calcium, Ion: 1.24 mmol/L (ref 1.15–1.40)
Calcium, Ion: 1.28 mmol/L (ref 1.15–1.40)
HCT: 33 % — ABNORMAL LOW (ref 39.0–52.0)
HCT: 34 % — ABNORMAL LOW (ref 39.0–52.0)
Hemoglobin: 11.2 g/dL — ABNORMAL LOW (ref 13.0–17.0)
Hemoglobin: 11.6 g/dL — ABNORMAL LOW (ref 13.0–17.0)
O2 Saturation: 64 %
O2 Saturation: 65 %
Potassium: 3.4 mmol/L — ABNORMAL LOW (ref 3.5–5.1)
Potassium: 3.5 mmol/L (ref 3.5–5.1)
Sodium: 142 mmol/L (ref 135–145)
Sodium: 144 mmol/L (ref 135–145)
TCO2: 24 mmol/L (ref 22–32)
TCO2: 26 mmol/L (ref 22–32)
pCO2, Ven: 43.5 mm[Hg] — ABNORMAL LOW (ref 44–60)
pCO2, Ven: 46.1 mm[Hg] (ref 44–60)
pH, Ven: 7.323 (ref 7.25–7.43)
pH, Ven: 7.33 (ref 7.25–7.43)
pO2, Ven: 36 mm[Hg] (ref 32–45)
pO2, Ven: 36 mm[Hg] (ref 32–45)

## 2023-04-17 LAB — BASIC METABOLIC PANEL
BUN/Creatinine Ratio: 41 — ABNORMAL HIGH (ref 10–24)
BUN: 66 mg/dL — ABNORMAL HIGH (ref 8–27)
CO2: 24 mmol/L (ref 20–29)
Calcium: 9.9 mg/dL (ref 8.6–10.2)
Chloride: 106 mmol/L (ref 96–106)
Creatinine, Ser: 1.6 mg/dL — ABNORMAL HIGH (ref 0.76–1.27)
Glucose: 241 mg/dL — ABNORMAL HIGH (ref 70–99)
Potassium: 4.4 mmol/L (ref 3.5–5.2)
Sodium: 145 mmol/L — ABNORMAL HIGH (ref 134–144)
eGFR: 46 mL/min/{1.73_m2} — ABNORMAL LOW (ref >59)

## 2023-04-17 LAB — GLUCOSE, CAPILLARY: Glucose-Capillary: 283 mg/dL — ABNORMAL HIGH (ref 70–99)

## 2023-04-17 SURGERY — RIGHT HEART CATH

## 2023-04-17 MED ORDER — SODIUM CHLORIDE 0.9 % IV SOLN: 250.0000 mL | INTRAVENOUS | Status: DC | PRN

## 2023-04-17 MED ORDER — HEPARIN (PORCINE) IN NACL 1000-0.9 UT/500ML-% IV SOLN
INTRAVENOUS | Status: DC | PRN
  Administered 2023-04-17: 500 mL

## 2023-04-17 MED ORDER — SODIUM CHLORIDE 0.9% FLUSH: 3.0000 mL | INTRAVENOUS | Status: DC | PRN

## 2023-04-17 MED ORDER — ASPIRIN 81 MG PO CHEW: 81.0000 mg | CHEWABLE_TABLET | ORAL | Status: DC

## 2023-04-17 MED ORDER — SODIUM CHLORIDE 0.9% FLUSH: 3.0000 mL | Freq: Two times a day (BID) | INTRAVENOUS | Status: DC

## 2023-04-17 MED ORDER — LIDOCAINE HCL (PF) 1 % IJ SOLN
INTRAMUSCULAR | Status: DC | PRN
  Administered 2023-04-17: 2 mL

## 2023-04-17 MED ORDER — ACETAMINOPHEN 325 MG PO TABS: 650.0000 mg | ORAL_TABLET | ORAL | Status: DC | PRN

## 2023-04-17 MED ORDER — ONDANSETRON HCL 4 MG/2ML IJ SOLN: 4.0000 mg | Freq: Four times a day (QID) | INTRAMUSCULAR | Status: DC | PRN

## 2023-04-17 SURGICAL SUPPLY — 6 items
CATH SWAN GANZ 7F STRAIGHT (CATHETERS) IMPLANT
GLIDESHEATH SLENDER 7FR .021G (SHEATH) IMPLANT
GUIDEWIRE .025 260CM (WIRE) IMPLANT
PACK CARDIAC CATHETERIZATION (CUSTOM PROCEDURE TRAY) IMPLANT
TRANSDUCER W/STOPCOCK (MISCELLANEOUS) IMPLANT
TUBING ART PRESS 72 MALE/FEM (TUBING) IMPLANT

## 2023-04-17 NOTE — Discharge Instructions (Signed)

## 2023-04-17 NOTE — Interval H&P Note (Signed)
History and Physical Interval Note:  04/17/2023 11:38 AM  Donald [name]  has presented today for surgery, with the diagnosis of hp.  The various methods of treatment have been discussed with the patient and family. After consideration of risks, benefits and other options for treatment, the patient has consented to  Procedure(s): RIGHT HEART CATH (N/A) as a surgical intervention.  The patient's history has been reviewed, patient examined, no change in status, stable for surgery.  I have reviewed the patient's chart and labs.  Questions were answered to the patient's satisfaction.     Bryan Lemma

## 2023-04-17 NOTE — Telephone Encounter (Signed)
Per [facility]-04/16/23 BMP has been resulted but not in Epic yet-they are unsure of reason not in Epic. I requested result faxed to Cath Lab attn: Victorino Dike [phone] and [phone].

## 2023-04-19 ENCOUNTER — Telehealth: Payer: Self-pay | Admitting: Cardiovascular Disease

## 2023-04-19 ENCOUNTER — Telehealth: Payer: Self-pay | Admitting: Pulmonary Disease

## 2023-04-19 DIAGNOSIS — G4734 Idiopathic sleep related nonobstructive alveolar hypoventilation: Secondary | ICD-10-CM

## 2023-04-19 DIAGNOSIS — I5042 Chronic combined systolic (congestive) and diastolic (congestive) heart failure: Secondary | ICD-10-CM

## 2023-04-19 DIAGNOSIS — G4733 Obstructive sleep apnea (adult) (pediatric): Secondary | ICD-10-CM

## 2023-04-19 NOTE — Telephone Encounter (Signed)
Pt wife calling in to inform us that her husband Oxygen machine is broken. He uses it while he naps and it was a self purchase. Pt will need a new prescription to get a new oxygen machine.

## 2023-04-19 NOTE — Telephone Encounter (Signed)
I am ok trying to order oxygen. He did not see me for O2 nor have I ordered it in the past. Without a study justifying need I suspect it will be a challenge to get O2. Please place appropriate O2 order and see if DME will deliver.  Have they reached out to sleep physician? - Dr. Vickey Huger ([facility]) ordered the O2 last - that should be first contact since they manage the CPAP and O2. Most appropriate office to handle this issue.

## 2023-04-19 NOTE — Telephone Encounter (Signed)
Pt's wife is requesting a callback regarding the pt's oxygen machine no longer working as of today. She stated he's been on it for years now while sleeping and napping so she'd like to see what the options would be for him to have oxygen machine again. Please advise

## 2023-04-19 NOTE — Telephone Encounter (Addendum)
Called and spoke to patient's wife who was calling to see about getting a replacement oxygen concentrator. Patient has been on oxygen for over 6 years. They bought their own concentrator out of pocket and it doesn't have a warranty. It was initially ordered by urology and pulmonary feels patient still needs it. She report patient patient's O2 sat drops down in the 70"s at night. After talking to social work they recommended they reach out to the pulmonologist to see if they can order it. She will call to see if they can order the oxygen and will call back if there are any issues.

## 2023-04-19 NOTE — Telephone Encounter (Addendum)
Called and spoke with patient's wife (Diane), per DPR.  She states that he was put on oxygen after a sleep study by Dr. Eugenio Hoes.  They bought the oxygen unit out of pocket, not a POC.  The unit made a loud noise, a high pitch noise and then stopped working.  He currently does not have oxygen to wear with CPAP.  She states that none of his sleep studies showed a drop in his oxygen level when he sleeps.  He was wearing 3.5 L of oxygen until last week when they turned it up to 4.5 L).  They are wanting a new script for oxygen to order online, however, they will need oxygen tonight.  Advised I would have to send a message to Dr. Judeth Horn and see what he recommends.  I called [employer] as patient's wife stated they had oxygen through them previously and was not happy and decided to buy their own concentrator out of pocket.  I spoke with someone at [facility] and was told they had no record of this patient having oxygen through [employer].  I called Brad with [employer] and had to leave a VM to see if we had ordered oxygen with them previously.  He has his CPAP through [employer]. Will await return call.  Dr. Judeth Horn, I cannot see where we ordered oxygen for this patient recently or in the past.  The patient's wife said she was referred to our office by the cardiologist that did his heart cath.  He wears oxygen with his CPAP at night as his oxygen levels only drop at night.  Please advise on recommendations regarding ordering oxygen for this patient since his current oxygen machine is not working.  Thank you.

## 2023-04-19 NOTE — Telephone Encounter (Signed)
Wife calling again. Needs 02 ASAP. No specific company. Not portable unit (compressor/condenser type)

## 2023-04-19 NOTE — Telephone Encounter (Signed)
Spoke with Elease Hashimoto at [employer], she states he had an ONO and his sats dropped, however, he is on CPAP and would have to have a CPAP titration study in order to get oxygen through insurance and that study would have to be within 30 days of the oxygen being ordered.

## 2023-04-20 ENCOUNTER — Ambulatory Visit (HOSPITAL_BASED_OUTPATIENT_CLINIC_OR_DEPARTMENT_OTHER): Payer: PPO | Admitting: Pulmonary Disease

## 2023-04-20 DIAGNOSIS — R0609 Other forms of dyspnea: Secondary | ICD-10-CM

## 2023-04-20 LAB — PULMONARY FUNCTION TEST
DL/VA % pred: 102 %
DL/VA: 4.22 ml/min/mmHg/L
DLCO cor % pred: 89 %
DLCO cor: 20.63 ml/min/mmHg
DLCO unc % pred: 79 %
DLCO unc: 18.34 ml/min/mmHg
FEF 25-75 Post: 2.22 L/s
FEF 25-75 Pre: 1.71 L/s
FEF2575-%Change-Post: 30 %
FEF2575-%Pred-Post: 104 %
FEF2575-%Pred-Pre: 80 %
FEV1-%Change-Post: 7 %
FEV1-%Pred-Post: 86 %
FEV1-%Pred-Pre: 80 %
FEV1-Post: 2.43 L
FEV1-Pre: 2.25 L
FEV1FVC-%Change-Post: 3 %
FEV1FVC-%Pred-Pre: 100 %
FEV6-%Change-Post: 4 %
FEV6-%Pred-Post: 89 %
FEV6-%Pred-Pre: 85 %
FEV6-Post: 3.19 L
FEV6-Pre: 3.05 L
FEV6FVC-%Change-Post: 0 %
FEV6FVC-%Pred-Post: 106 %
FEV6FVC-%Pred-Pre: 106 %
FVC-%Change-Post: 4 %
FVC-%Pred-Post: 83 %
FVC-%Pred-Pre: 80 %
FVC-Post: 3.19 L
FVC-Pre: 3.05 L
Post FEV1/FVC ratio: 76 %
Post FEV6/FVC ratio: 100 %
Pre FEV1/FVC ratio: 74 %
Pre FEV6/FVC Ratio: 100 %
RV % pred: 112 %
RV: 2.53 L
TLC % pred: 92 %
TLC: 5.81 L

## 2023-04-20 NOTE — Patient Instructions (Signed)
Full PFT Performed Today  

## 2023-04-20 NOTE — Telephone Encounter (Signed)
Called and spoke with pts wife as listed in dpr. Pt states she did try that, he is on o2 when he is sleep. Order for ono placed, will await results before ordering o2. Pt wife verbalized understanding.

## 2023-04-20 NOTE — Progress Notes (Signed)
Full PFT Performed Today  

## 2023-04-21 NOTE — Telephone Encounter (Signed)
Dr. Judeth Horn, please see mychart sent by pt and advise.

## 2023-04-23 NOTE — Progress Notes (Signed)
Cardiology Office Note:  .   Date:  04/30/2023  ID:  Donald [name], DOB [birth year], MRN [id number] PCP: Chilton Greathouse, MD  [facility] Providers Cardiologist:  Thurmon Fair, MD }   History of Present Illness: .   Donald [name] is a 72 y.o. male with a hx of nonischemic cardiomyopathy dating back to at least 2013 when he had coronary angiography that showed no evidence of significant obstructive disease.  Most recent LVEF by echo 08/09/2022 was 40%.  Additional medical problems include treated hypertension, type 2 diabetes mellitus, hypercholesterolemia, OSA on CPAP.    He is now on oxygen via nasal cannula which is 4 L when he wears it.  He is no longer wearing oxygen at this time.  His weight has consistently been around 200 pounds but he got short of breath walking less than 50 ft. He was scheduled for a right heart cath with Dr. Bryan Lemma on 04/17/2023 due to ongoing symptoms.  Findings were consistent with mild borderline pulmonary pretension with a PAP of 27 over 2023 mmHg with a pulmonary wedge pressure of 11 mmHg.  It was recommended that he follow-up with pulmonology.  He is being followed by Dr. Judeth Horn.  He did have normal PFTs, and Dr. Judeth Horn did not feel his lungs were the likely culprit of his breathing status.  He was started on an inhaler to use as needed.  He was started on Breztri 2 puffs a day.  He was unable to tolerate this causing worsening coughing.Marland Kitchen  He and his wife come today very frustrated as they have not been able to find a direct cause to his breathing insufficiency.  The patient states that he can walk around in his home and not be short of breath but when he is seated he can get short of breath because he is not taking deep breaths.  His oxygen level drops into the 80s at that point.  He states that it has gotten some better with oxygen levels about 88 to 90%.  He is to work with a Psychologist, educational but is stopped doing so because he is unable to  breathe very well during exercise.  ROS: As above otherwise negative  Studies Reviewed: .   Right Heart Cath 04/17/2023     Hemodynamic findings consistent with borderline mild pulmonary hypertension.   Right Heart Cath       Right Heart Pressures Hemodynamic findings consistent with mild pulmonary hypertension. Borderline pulmonary pretension: PAP-mean 27/20-23 mmHg.  PCWP 11 mmHg.  Ao sat 93%, PA sat 65%.   Cardiac Output-Index: Hiram Comber) 5.87-2.91; (TD)6.46-3.2  Right Atrium Right atrial pressure is elevated. Mean RAP 9 mmHg  Right Ventricle RVP-EDP 29/14-8 mmHg   Echocardiogram 08/09/2022  1. Left ventricular ejection fraction, by estimation, is 40%. The left  ventricle has moderately decreased function. The left ventricle  demonstrates global hypokinesis. The left ventricular internal cavity size  was mildly dilated. Left ventricular  diastolic parameters are consistent with Grade I diastolic dysfunction  (impaired relaxation). There is abnromal septal motion.   2. Right ventricular systolic function is normal. The right ventricular  size is mildly enlarged. Tricuspid regurgitation signal is inadequate for  assessing PA pressure.   3. The mitral valve is grossly normal. Trivial mitral valve  regurgitation. No evidence of mitral stenosis.   4. The aortic valve is tricuspid. There is mild thickening of the aortic  valve. Aortic valve regurgitation is not visualized. Aortic valve  sclerosis/calcification is present, without  any evidence of aortic  stenosis.   5. The inferior vena cava is normal in size with greater than 50%  respiratory variability, suggesting right atrial pressure of 3 mmHg.   Comparison(s): No significant change from prior study. Prior images  reviewed side by side.    Coronary CTA 05/11/2021 IMPRESSION: 1. Coronary calcium score of 870. This was 38 percentile for age and sex matched control.   2. Normal coronary origin with right dominance.   3.  Moderate coronary artery disease. CAD-RADS 3. Moderate stenosis. Consider symptom-guided anti-ischemic pharmacotherapy as well as risk factor modification per guideline directed care Physical Exam:   VS:  BP 100/62 (BP Location: Left Arm, Patient Position: Sitting, Cuff Size: Normal)   Pulse 80   Ht 5\' 8"  (1.727 m)   Wt 201 lb 3.2 oz (91.3 kg)   SpO2 97%   BMI 30.59 kg/m    Wt Readings from Last 3 Encounters:  04/30/23 201 lb 3.2 oz (91.3 kg)  04/20/23 202 lb 3.2 oz (91.7 kg)  04/17/23 194 lb (88 kg)    GEN: Well nourished, well developed in no acute distress NECK: No JVD; No carotid bruits CARDIAC: RRR, no murmurs, rubs, gallops RESPIRATORY:  Clear to auscultation without rales, wheezing or rhonchi  ABDOMEN: Soft, non-tender, non-distended EXTREMITIES:  No edema; No deformity   ASSESSMENT AND PLAN: .    Chronic dyspnea on exertion and at rest: He notices most when he is seated as he states he does not take deep breaths and he noticed that his oxygen level drops.  The patient feels dyspnea with exertion but not with normal walking through his home.  I have gone over the right heart cath results, the most recent echocardiogram results in April 2024, as well as a coronary CTA results from January 2023.  I explained each of the tests results and answered multiple questions.  He is also being seen by pulmonary and was unable to tolerate inhalers.  He no longer uses oxygen, but this has been reordered and he should be getting a tank sometime this week.  This is followed by Dr. Judeth Horn.   2.  Chronic systolic heart failure: Most recent echocardiogram April 2024 EF of 40%.  Repeating this May 09, 2022.  He is on GDMT with exception of SGLT inhibitor.  I have spoken with Dr. Royann Shivers about this patient and his current regimen.  He takes Lasix as needed for weight greater than 198 pounds.  Looking back on his previous notes the patient was unable to tolerate SGLT 1 inhibitors because he was  having frequent UTIs.  He will follow-up with Dr. Royann Shivers on May 28, 2023 to discuss any further treatment options depending upon results of echo.  It would be my suggestion that he be seen by Advanced Heart Failure team for ongoing assistance in his care and treatment regimen.    The patient verbalizes understanding.  3.  Hypertension: Blood pressure is low normal for reduced EF.  No other changes in his regimen left considering a change from metoprolol to carvedilol.  With his breathing status would like to keep him on cardioselective beta-blocker for now to avoid bronchospasms and worsening coughing.   4.  OSA: On CPAP.  States he is wearing it every night.  He does not feel it is making much of a difference in his breathing during the daytime.  He will need to make sure that he is cleaning the machine, tubing, and device frequently to avoid bacterial  growth within the system.  5.  Obesity: He has a good bit of central obesity.  Weight loss would be extremely helpful to reduce burden on breathing and cardiac health.  He is unable to workout due to breathing issues.  Adherence to diabetic diet and low calorie diet would be beneficial.  6.  Uncontrolled diabetes: Most recent hemoglobin A1c was drawn in May 2024 with a level of 8.5.  This is followed by PCP with ongoing labs and surveillance.  I have spent over 45 minutes with the patient, reviewing records, discussing case with Dr. Royann Shivers, giving explanation concerning test results and plans concerning his treatment regimen.         Signed, Bettey Mare. Aryaa Bunting DNP, ANP, AACC aggravated the cough and he Thank you is on that right so your your lungs are concerning for pulmonary pressure is not having pulmonary hypertension or anything like that so have you been using the inhaler [employer]

## 2023-04-24 DIAGNOSIS — G4733 Obstructive sleep apnea (adult) (pediatric): Secondary | ICD-10-CM | POA: Diagnosis not present

## 2023-04-25 ENCOUNTER — Telehealth: Payer: Self-pay

## 2023-04-25 DIAGNOSIS — G8929 Other chronic pain: Secondary | ICD-10-CM | POA: Diagnosis not present

## 2023-04-25 DIAGNOSIS — F39 Unspecified mood [affective] disorder: Secondary | ICD-10-CM | POA: Diagnosis not present

## 2023-04-25 DIAGNOSIS — M170 Bilateral primary osteoarthritis of knee: Secondary | ICD-10-CM | POA: Diagnosis not present

## 2023-04-25 DIAGNOSIS — E785 Hyperlipidemia, unspecified: Secondary | ICD-10-CM | POA: Diagnosis not present

## 2023-04-25 DIAGNOSIS — E118 Type 2 diabetes mellitus with unspecified complications: Secondary | ICD-10-CM | POA: Diagnosis not present

## 2023-04-25 DIAGNOSIS — N182 Chronic kidney disease, stage 2 (mild): Secondary | ICD-10-CM | POA: Diagnosis not present

## 2023-04-25 DIAGNOSIS — J45909 Unspecified asthma, uncomplicated: Secondary | ICD-10-CM | POA: Diagnosis not present

## 2023-04-25 DIAGNOSIS — R053 Chronic cough: Secondary | ICD-10-CM | POA: Diagnosis not present

## 2023-04-25 DIAGNOSIS — I2510 Atherosclerotic heart disease of native coronary artery without angina pectoris: Secondary | ICD-10-CM | POA: Diagnosis not present

## 2023-04-25 DIAGNOSIS — I5042 Chronic combined systolic (congestive) and diastolic (congestive) heart failure: Secondary | ICD-10-CM | POA: Diagnosis not present

## 2023-04-25 DIAGNOSIS — I130 Hypertensive heart and chronic kidney disease with heart failure and stage 1 through stage 4 chronic kidney disease, or unspecified chronic kidney disease: Secondary | ICD-10-CM | POA: Diagnosis not present

## 2023-04-25 DIAGNOSIS — Z6830 Body mass index (BMI) 30.0-30.9, adult: Secondary | ICD-10-CM | POA: Diagnosis not present

## 2023-04-25 MED ORDER — PREDNISONE 20 MG PO TABS: 20.0000 mg | ORAL_TABLET | Freq: Every day | ORAL | 0 refills | Status: AC

## 2023-04-25 NOTE — Telephone Encounter (Addendum)
Called patient regarding results. Left detailed message regarding results.----- Message from Joni Reining sent at 04/16/2023  2:53 PM EST ----- I have reviewed CBC, his hemoglobin is normal at 12.0 with hematocrit of 38.4.  Awaiting BMET.

## 2023-04-26 ENCOUNTER — Telehealth: Payer: Self-pay

## 2023-04-26 NOTE — Telephone Encounter (Addendum)
Results viewed by patient via Mychart.----- Message from Joni Reining sent at 04/16/2023  2:53 PM EST ----- I have reviewed CBC, his hemoglobin is normal at 12.0 with hematocrit of 38.4.  Awaiting BMET.

## 2023-04-30 ENCOUNTER — Ambulatory Visit: Payer: PPO | Attending: Adult Health | Admitting: Adult Health

## 2023-04-30 ENCOUNTER — Encounter: Payer: Self-pay | Admitting: Adult Health

## 2023-04-30 VITALS — BP 100/62 | HR 80 | Ht 68.0 in | Wt 201.2 lb

## 2023-04-30 DIAGNOSIS — E7800 Pure hypercholesterolemia, unspecified: Secondary | ICD-10-CM | POA: Diagnosis not present

## 2023-04-30 DIAGNOSIS — G4733 Obstructive sleep apnea (adult) (pediatric): Secondary | ICD-10-CM

## 2023-04-30 DIAGNOSIS — Z794 Long term (current) use of insulin: Secondary | ICD-10-CM | POA: Diagnosis not present

## 2023-04-30 DIAGNOSIS — R0602 Shortness of breath: Secondary | ICD-10-CM | POA: Diagnosis not present

## 2023-04-30 DIAGNOSIS — I10 Essential (primary) hypertension: Secondary | ICD-10-CM

## 2023-04-30 DIAGNOSIS — E119 Type 2 diabetes mellitus without complications: Secondary | ICD-10-CM

## 2023-04-30 DIAGNOSIS — I5022 Chronic systolic (congestive) heart failure: Secondary | ICD-10-CM

## 2023-04-30 NOTE — Patient Instructions (Signed)
Medication Instructions:  No changes *If you need a refill on your cardiac medications before your next appointment, please call your pharmacy*   Lab Work: No Labs If you have labs (blood work) drawn today and your tests are completely normal, you will receive your results only by: MyChart Message (if you have MyChart) OR A paper copy in the mail If you have any lab test that is abnormal or we need to change your treatment, we will call you to review the results.   Testing/Procedures: No Testing   Follow-Up: At [facility], you and your health needs are our priority.  As part of our continuing mission to provide you with exceptional heart care, we have created designated Provider Care Teams.  These Care Teams include your primary Cardiologist (physician) and Advanced Practice Providers (APPs -  Physician Assistants and Nurse Practitioners) who all work together to provide you with the care you need, when you need it.  We recommend signing up for the patient portal called "MyChart".  Sign up information is provided on this After Visit Summary.  MyChart is used to connect with patients for Virtual Visits (Telemedicine).  Patients are able to view lab/test results, encounter notes, upcoming appointments, etc.  Non-urgent messages can be sent to your provider as well.   To learn more about what you can do with MyChart, go to [email or web address].    Your next appointment:   First Available ( Doctor Only).  Provider:   Thurmon Fair, MD

## 2023-05-10 ENCOUNTER — Ambulatory Visit (HOSPITAL_COMMUNITY): Payer: PPO | Attending: Adult Health

## 2023-05-10 DIAGNOSIS — I5020 Unspecified systolic (congestive) heart failure: Secondary | ICD-10-CM | POA: Diagnosis not present

## 2023-05-10 DIAGNOSIS — I429 Cardiomyopathy, unspecified: Secondary | ICD-10-CM

## 2023-05-10 LAB — ECHOCARDIOGRAM COMPLETE
Area-P 1/2: 2.62 cm2
S' Lateral: 4.7 cm

## 2023-05-11 ENCOUNTER — Telehealth: Payer: Self-pay

## 2023-05-11 NOTE — Telephone Encounter (Addendum)
 Reulsts viewed by patient via Mychart----- Message from [name] sent at 05/11/2023  7:28 AM EST ----- I have reviewed the echocardiogram report.  Heart pumping function is still low at 40-45% similar to prior echocardiogram. Heart stiffening has improved from grade II down to grade 1. Trivial aortic valve back flow.  Continue current regimen and follow up with Dr. Francyne.

## 2023-05-11 NOTE — Progress Notes (Signed)
 I have reviewed the echocardiogram report.  Heart pumping function is still low at 40-45% similar to prior echocardiogram. Heart stiffening has improved from grade II down to grade 1. Trivial aortic valve back flow.  Continue current regimen and follow up with Dr. Francyne.

## 2023-05-16 ENCOUNTER — Other Ambulatory Visit: Payer: Self-pay | Admitting: Cardiovascular Disease

## 2023-05-16 DIAGNOSIS — R0902 Hypoxemia: Secondary | ICD-10-CM | POA: Diagnosis not present

## 2023-05-16 DIAGNOSIS — G4730 Sleep apnea, unspecified: Secondary | ICD-10-CM | POA: Diagnosis not present

## 2023-05-17 DIAGNOSIS — Z6831 Body mass index (BMI) 31.0-31.9, adult: Secondary | ICD-10-CM | POA: Diagnosis not present

## 2023-05-17 DIAGNOSIS — M5416 Radiculopathy, lumbar region: Secondary | ICD-10-CM | POA: Diagnosis not present

## 2023-05-18 ENCOUNTER — Telehealth: Payer: PPO | Admitting: Primary Care

## 2023-05-18 ENCOUNTER — Ambulatory Visit: Payer: PPO | Admitting: Dietician

## 2023-05-28 ENCOUNTER — Ambulatory Visit: Payer: PPO | Attending: Cardiovascular Disease | Admitting: Cardiovascular Disease

## 2023-05-28 ENCOUNTER — Ambulatory Visit (INDEPENDENT_AMBULATORY_CARE_PROVIDER_SITE_OTHER): Payer: PPO

## 2023-05-28 VITALS — BP 120/78 | HR 81 | Ht 68.0 in | Wt 207.0 lb

## 2023-05-28 DIAGNOSIS — R000 Tachycardia, unspecified: Secondary | ICD-10-CM

## 2023-05-28 DIAGNOSIS — E119 Type 2 diabetes mellitus without complications: Secondary | ICD-10-CM

## 2023-05-28 DIAGNOSIS — G4733 Obstructive sleep apnea (adult) (pediatric): Secondary | ICD-10-CM | POA: Diagnosis not present

## 2023-05-28 DIAGNOSIS — Z794 Long term (current) use of insulin: Secondary | ICD-10-CM | POA: Diagnosis not present

## 2023-05-28 DIAGNOSIS — I10 Essential (primary) hypertension: Secondary | ICD-10-CM

## 2023-05-28 DIAGNOSIS — G3184 Mild cognitive impairment, so stated: Secondary | ICD-10-CM | POA: Diagnosis not present

## 2023-05-28 DIAGNOSIS — Z79899 Other long term (current) drug therapy: Secondary | ICD-10-CM

## 2023-05-28 DIAGNOSIS — I5042 Chronic combined systolic (congestive) and diastolic (congestive) heart failure: Secondary | ICD-10-CM | POA: Diagnosis not present

## 2023-05-28 DIAGNOSIS — E785 Hyperlipidemia, unspecified: Secondary | ICD-10-CM

## 2023-05-28 DIAGNOSIS — I25810 Atherosclerosis of coronary artery bypass graft(s) without angina pectoris: Secondary | ICD-10-CM

## 2023-05-28 MED ORDER — POTASSIUM CHLORIDE CRYS ER 20 MEQ PO TBCR: 20.0000 meq | EXTENDED_RELEASE_TABLET | Freq: Every day | ORAL | 3 refills | Status: DC

## 2023-05-28 MED ORDER — FUROSEMIDE 40 MG PO TABS
ORAL_TABLET | ORAL | 3 refills | Status: DC
Start: 2023-05-28 — End: 2023-07-03

## 2023-05-28 NOTE — Progress Notes (Unsigned)
Cardiology Office Note:    Date:  05/30/2023   ID:  Donald [name], DOB 1950/06/25, MRN [id number]  PCP:  Chilton Greathouse, MD   [facility] Providers Cardiologist:  Thurmon Fair, MD     Referring MD: Chilton Greathouse, MD   No chief complaint on file.    History of Present Illness:    Donald [name] is a 73 y.o. male with a hx of nonischemic cardiomyopathy dating back to at least 2013 when he had coronary angiography that showed no evidence of significant obstructive disease.  Most recent LVEF by echo 08/09/2022 was 40%.  Additional medical problems include treated hypertension, type 2 diabetes mellitus, hypercholesterolemia, OSA on CPAP (Dr. Vickey Huger), chronic bronchitis.    He is not feeling well and both he and his wife are very frustrated.  He had very abrupt worsening of his shortness of breath on 03/31/2023.  He was on prednisone at the time for lung problems.  He went to the emergency room where his physical exam and vital signs were unremarkable.  He is ECG showed sinus rhythm and no meaningful changes.  Chest x-ray and CT of the chest showed no evidence of pulmonary embolism or pneumonia or heart failure (although there was incidental evidence of no wound aortic atherosclerosis and coronary artery calcifications).  His white blood cell count was mildly elevated at 15K (on prednisone).  His troponin did not show signs of an acute myocardial infarction.  Unfortunately, he has not been better since.  He continues to have a sensation of being short of breath all the time, even at rest, although his symptoms worsen substantially with even slight activity such as walking around the house from room to room.  Even changing positions sometimes makes him short of breath.  Seems to have NYHA functional class III dyspnea.  He habitually sleeps on small incline and this has not changed, but it is hard to say whether he has any true orthopnea.  He has not noticed edema, but today on  physical exam he has 1-2+ pitting edema in both lower extremities.  He is also well above our previously estimated "dry weight" of 200 pounds and has actually gained 13 pounds since his right heart catheterization on 04/17/2023 when he he weighed 194 pounds and appeared to be at optimal volume status (PCWP 11 mmHg, PAP 27/20, cardiac index 3.2 L/min/m, RAP 9 mmHg).  BNP on 03/31/2023, when he weighed around 200 pounds and when he was short of breath, was only 26, strongly suggesting that his shortness of breath is not from congestive heart failure.  He is largely on comprehensive medical therapy for heart failure including maximum dose Entresto, spironolactone, metoprolol succinate, but is not on an SGLT2 inhibitor since he had problems with urinary tract infections on that medication.  Metabolic control remains suboptimal with a hemoglobin A1c of 8.0% on 03/21/2023 and an LDL cholesterol of 104 and triglycerides of 406.  In 2013, nuclear stress that showed a left ventricular ejection fraction of 50%, with a normal perfusion pattern and mild left ventricular dilatation.  His echocardiogram also showed an EF of 50-55% with mild diffuse left ventricular hypokinesis and no significant valvular abnormalities.  In 2016, his pulmonary specialist Dr. Marchelle Gearing ordered a cardiopulmonary excise stress test that showed mild-mod reduced functional capacity without any clear evidence of cardiovascular limitations.  Chronotropic response was normal.  The peak VO2 was mildly reduced at 23.5 mL/kilogram/minute (76% of predicted).  VE/VCO2 slope was normal.echocardiogram performed 03/22/2021 shows  further reduction in LVEF which is now down to 35-40%.  Diastolic parameters suggest normal filling pressures.  The systolic PA pressure is normal at 29 mmHg. Once again there are no significant valvular abnormalities.  Despite transition to [facility] since maximum dose), metoprolol succinate and spironolactone LVEF has not really  increased much, now 40% on echocardiogram performed April 2023.  He had allergic response to SGLT2 inhibitors and has a history of very frequent urinary tract infections due to ureteral obstruction.   Past Medical History:  Diagnosis Date   Allergic rhinitis, seasonal    Asthma    Bronchitis    CHF (congestive heart failure) (HCC)    Chronic kidney disease, stage I    Cognitive impairment    Effects Short Term Memory   collar bone    dislocation left side 05/2014   Degenerative joint disease of knee    bilateral   Diabetes mellitus    Type 2   Dysrhythmia    PVCs   Fatty liver    GERD (gastroesophageal reflux disease)    Headache    Heachaces r/t eyes   Heart murmur    history of   History of acute prostatitis    History of hematuria    History of hiatal hernia    H/O   Hyperlipidemia    Hypertension    IgA nephropathy    OSA on CPAP    Pneumonia    Sinusitis     Past Surgical History:  Procedure Laterality Date   BIOPSY  05/17/2020   Procedure: BIOPSY;  Surgeon: Vida Rigger, MD;  Location: [facility];  Service: Endoscopy;;   CARDIAC CATHETERIZATION  09/29/2011   normal   CATARACT EXTRACTION Bilateral    CERVICAL SPINE SURGERY     COLONOSCOPY WITH PROPOFOL N/A 05/17/2020   Procedure: COLONOSCOPY WITH PROPOFOL;  Surgeon: Vida Rigger, MD;  Location: [facility];  Service: Endoscopy;  Laterality: N/A;   deviated septum     1990s   ESOPHAGOGASTRODUODENOSCOPY (EGD) WITH PROPOFOL N/A 05/17/2020   Procedure: ESOPHAGOGASTRODUODENOSCOPY (EGD) WITH PROPOFOL;  Surgeon: Vida Rigger, MD;  Location: [facility];  Service: Endoscopy;  Laterality: N/A;   HEMOSTASIS CLIP PLACEMENT  05/17/2020   Procedure: HEMOSTASIS CLIP PLACEMENT;  Surgeon: Vida Rigger, MD;  Location: [facility];  Service: Endoscopy;;   HERNIA REPAIR     LEFT HEART CATHETERIZATION WITH CORONARY ANGIOGRAM N/A 09/29/2011   Procedure: LEFT HEART CATHETERIZATION WITH CORONARY ANGIOGRAM;  Surgeon: Thurmon Fair, MD;  Location: [facility] CATH LAB;  Service: Cardiovascular;  Laterality: N/A;   NM MYOVIEW LTD  07/08/2011   mild LV dilatation,mild septal hypokinesia   POLYPECTOMY  05/17/2020   Procedure: POLYPECTOMY;  Surgeon: Vida Rigger, MD;  Location: [facility];  Service: Endoscopy;;   RIGHT HEART CATH N/A 04/17/2023   Procedure: RIGHT HEART CATH;  Surgeon: Marykay Lex, MD;  Location: [facility] INVASIVE CV LAB;  Service: Cardiovascular;  Laterality: N/A;   TONSILLECTOMY     US ECHOCARDIOGRAPHY  09/07/2011   EF 35-45%,mod.ant hypokinesis,boderline LA & RA enlargement,trace MR,TR & PI    Current Medications: Current Meds  Medication Sig   Continuous Blood Gluc Sensor (FREESTYLE LIBRE 14 DAY SENSOR) MISC    DULoxetine (CYMBALTA) 30 MG capsule Take 30 mg by mouth in the morning.   finasteride (PROSCAR) 5 MG tablet Take 5 mg by mouth in the morning.   insulin lispro (HUMALOG KWIKPEN) 200 UNIT/ML KwikPen Inject 40-60 Units into the skin in the morning, at noon, and  at bedtime.   Insulin Pen Needle (UNIFINE PENTIPS PLUS) 32G X 4 MM MISC USE WITH TRESIBA DAILY   memantine (NAMENDA) 10 MG tablet Take 1 tablet (10 mg total) by mouth 2 (two) times daily.   metFORMIN (GLUCOPHAGE) 1000 MG tablet Take 1 tablet (1,000 mg total) by mouth 2 (two) times daily with a meal. Restart on 8/22   metoprolol succinate (TOPROL-XL) 50 MG 24 hr tablet Take 50 mg by mouth 2 (two) times daily. Take with or immediately following a meal.   naproxen sodium (ALEVE) 220 MG tablet Take 220-440 mg by mouth daily as needed (pain.).   pantoprazole (PROTONIX) 40 MG tablet Take 1 tablet (40 mg total) by mouth 2 (two) times daily. (Patient taking differently: Take 40 mg by mouth every evening.)   potassium chloride SA (KLOR-CON M) 20 MEQ tablet Take 1 tablet (20 mEq total) by mouth daily.   rosuvastatin (CRESTOR) 20 MG tablet TAKE ONE TABLET BY MOUTH ONE TIME DAILY   sacubitril-valsartan (ENTRESTO) 97-103 MG Take 1 tablet by mouth 2  (two) times daily.   silodosin (RAPAFLO) 8 MG CAPS capsule Take 8 mg by mouth every evening.   spironolactone (ALDACTONE) 25 MG tablet Take one-half tablet by mouth daily   TRESIBA FLEXTOUCH 200 UNIT/ML FlexTouch Pen Inject 100 Units into the skin in the morning.   [DISCONTINUED] furosemide (LASIX) 20 MG tablet Take 1 tablet (20 mg total) by mouth as needed. Take 1 tablet (20 mg) daily if weight is greater than 200. (Patient taking differently: Take 20 mg by mouth daily.)     Allergies:   Ciprofloxacin, Empagliflozin, Levofloxacin, Aricept [donepezil hcl], Bactrim [sulfamethoxazole-trimethoprim], Levofloxacin, and Misc. sulfonamide containing compounds   Social History   Socioeconomic History   Marital status: Married    Spouse name: Diane   Number of children: 1   Years of education: 14   Highest education level: Not on file  Occupational History   Occupation: Teaching laboratory technician: Nurse, children's  Tobacco Use   Smoking status: Never   Smokeless tobacco: Never   Tobacco comments:    Second hand smoke for 26 years per pt  Vaping Use   Vaping status: Never Used  Substance and Sexual Activity   Alcohol use: No    Alcohol/week: 0.0 standard drinks of alcohol   Drug use: No   Sexual activity: Yes  Other Topics Concern   Not on file  Social History Narrative   Patient is married (Diane) and lives at home with his wife.   Patient has one child.   Patient is working full-time.   Patient has a Scientist, research (physical sciences).   Patient is left-handed.   Patient drinks one glass of tea daily, one soda daily.   Social Drivers of Corporate investment banker Strain: Not on file  Food Insecurity: Not on file  Transportation Needs: Not on file  Physical Activity: Not on file  Stress: Not on file  Social Connections: Not on file     Family History: The patient's family history includes Coronary artery disease in his father; Diabetes type II in his father; Heart attack in his  father.  ROS:   Please see the history of present illness.     All other systems reviewed and are negative.  EKGs/Labs/Other Studies Reviewed:    The following studies were reviewed today: Right heart catheterization 04/17/2023:  Right Heart Pressures Hemodynamic findings consistent with mild pulmonary hypertension. Borderline pulmonary pretension: PAP-mean 27/20-23 mmHg.  PCWP 11 mmHg.  Ao sat 93%, PA sat 65%.   Cardiac Output-Index: Hiram Comber) 5.87-2.91; (TD)6.46-3.2  Right Atrium Right atrial pressure is elevated. Mean RAP 9 mmHg  Right Ventricle RVP-EDP 29/14-8 mmHg      RECOMMENDATIONS   In the absence of any other complications or medical issues, we expect the patient to be ready for discharge from a cath perspective on 04/17/2023.   Continue evaluate for nonpulmonary pretension etiology for dyspnea. After other options are exhausted, could consider right heart cath with bicycle to assess for exercise related elevated pressures.    Echocardiogram 05/09/2022:    1. Left ventricular ejection fraction, by estimation, is 40 to 45%. The  left ventricle has mildly decreased function. The left ventricle has no  regional wall motion abnormalities. Left ventricular diastolic parameters  are consistent with Grade I  diastolic dysfunction (impaired relaxation). GLS -16.1%.   2. Right ventricular systolic function is normal. The right ventricular  size is normal.   3. The mitral valve is normal in structure. No evidence of mitral valve  regurgitation. No evidence of mitral stenosis.   4. The aortic valve is normal in structure. Aortic valve regurgitation is  trivial. No aortic stenosis is present.   5. Aortic dilatation noted. There is borderline dilatation of the  ascending aorta, measuring 36 mm.   6. The inferior vena cava is normal in size with greater than 50%  respiratory variability, suggesting right atrial pressure of 3 mmHg.     Event monitor 05/09/2022:    Rhythm is  normal sinus rhythm with normal circadian variation.   There are rare isolated premature atrial contractions and a single 4 beat run of nonsustained atrial tachycardia.  Atrial fibrillation was not seen.   There were rare premature ventricular complexes, very rare couplets and a brief period of ventricular try Gemini.  When tachycardia is not seen.   There is no significant bradycardia.   Symptom driven recordings show isolated premature atrial contractions or premature ventricular contractions.   Minimally abnormal event monitor.  Patient's symptoms are associated with isolated ectopic beats.  EKG: Personally reviewed the tracing from 04/13/2023 Which shows normal sinus rhythm and minor nonspecific repolarization abnormalities   Recent Labs: 09/27/2022: ALT 33 12/06/2022: NT-Pro BNP 46 03/31/2023: B Natriuretic Peptide 26.0 04/16/2023: BUN 66; Creatinine, Ser 1.60; Platelets 283 04/17/2023: Hemoglobin 11.2; Potassium 3.4; Sodium 144  Recent Lipid Panel    Component Value Date/Time   CHOL 233 (H) 05/13/2021 0949   TRIG 386 (H) 05/13/2021 0949   HDL 40 05/13/2021 0949   CHOLHDL 5.8 (H) 05/13/2021 0949   LDLCALC 124 (H) 05/13/2021 0949     Risk Assessment/Calculations:           Physical Exam:    VS:  BP 120/78 (BP Location: Left Arm, Patient Position: Sitting)   Pulse 81   Ht 5\' 8"  (1.727 m)   Wt 207 lb (93.9 kg)   SpO2 92%   BMI 31.47 kg/m     Wt Readings from Last 3 Encounters:  05/28/23 207 lb (93.9 kg)  04/30/23 201 lb 3.2 oz (91.3 kg)  04/20/23 202 lb 3.2 oz (91.7 kg)     General: Alert, oriented x3, no distress Head: no evidence of trauma, PERRL, EOMI, no exophtalmos or lid lag, no myxedema, no xanthelasma; normal ears, nose and oropharynx Neck: mildly elevated jugular venous pulsations  at 6-7 cm; brisk carotid pulses without delay and no carotid bruits Chest: clear to auscultation, no signs of  consolidation by percussion or palpation, normal fremitus,  symmetrical and full respiratory excursions Cardiovascular: normal position and quality of the apical impulse, regular rhythm, normal first and second heart sounds, no murmurs, rubs or gallops Abdomen: no tenderness or distention, no masses by palpation, no abnormal pulsatility or arterial bruits, normal bowel sounds, no hepatosplenomegaly Extremities:  1-2+ pretibial edema bilaterally, symmetrically  Neurological: grossly nonfocal Psych: Normal mood and affect    ASSESSMENT:    1. Chronic combined systolic and diastolic congestive heart failure (HCC)   2. Coronary artery disease involving coronary bypass graft of native heart without angina pectoris   3. Medication management   4. Type 2 diabetes mellitus without complication, with long-term current use of insulin (HCC)   5. Essential hypertension   6. Dyslipidemia (high LDL; low HDL)   7. OSA on CPAP   8. MCI (mild cognitive impairment)        PLAN:    In order of problems listed above:  CHF:  throughout all these events, his LVEF has remained unchanged at about 40-45% and he is known to have nonischemic dilated cardiomyopathy.  Today he is clearly hypervolemic and probably about 8-13 pounds above his ideal volume weight. He is on maximum medical therapy (maximum dose Entresto, good dose of beta-blocker and spironolactone, not using SGLT2 inhibitors due to side effects).    Will prescribe diuretics.  He points out that he was still feeling poorly and short of breath even when he was at dry weight, 194 pounds and clearly euvolemic.  All in all, but data suggest that his shortness of breath is not due to congestive heart failure.  PFTs were also pretty unremarkable.  He is hypervolemic today and we need to fix that, but I am not sure how to improve his symptoms. CAD: At his last evaluation he did not have any meaningful coronary stenoses.  If we really cannot figure out why he is short of breath it may be worthwhile performing a true  left heart catheterization to make sure were not missing coronary artery disease as a cause for his symptoms.  Ideally would like his LDL cholesterol less than 70. DM: Remains poorly controlled with a hemoglobin A1c most recently at 8%.  He did not tolerate SGLT2 inhibitors due to urinary tract infections. HTN: Very controlled in fact his diet thought blood pressure is relatively low. HLP: on rosuvastatin.  Excellent LDL cholesterol on his most recent lipid profile, chronically low HDL.  Normal triglycerides.  Continue same medications. OSA: He reports that he is compliant with CPAP (Monitored by Dr. Vickey Huger).  Denies daytime hypersomnolence Cognitive deficits: Unchanged. Also followed by Dr. Vickey Huger in the neurology clinic.  Note absence of specific abnormalities on PET scan of the brain and MRI of the brain (although he does have some mild chronic microvascular ischemic changes in the hemispheres and pons).        Medication Adjustments/Labs and Tests Ordered: Current medicines are reviewed at length with the patient today.  Concerns regarding medicines are outlined above.  Orders Placed This Encounter  Procedures   Basic metabolic panel   Brain natriuretic peptide   LONG TERM MONITOR (3-14 DAYS)   Meds ordered this encounter  Medications   furosemide (LASIX) 40 MG tablet    Sig: Take 80 mg daily until weight reaches 194 pounds. Then decrease to 40 mg daily    Dispense:  180 tablet    Refill:  3   potassium chloride SA (KLOR-CON M) 20  MEQ tablet    Sig: Take 1 tablet (20 mEq total) by mouth daily.    Dispense:  90 tablet    Refill:  3    Patient Instructions  Medication Instructions:  Increase Furosemide to 80 mg daily until your weight reaches 194 pounds, then decrease it to 40 mg daily Start Potassium 20 meq daily *If you need a refill on your cardiac medications before your next appointment, please call your pharmacy*   Lab Work: BMP, BNP- Please return for Blood Work on  Friday 06/01/23. No appointment needed, lab here at the office is open Monday-Friday from 8AM to 4PM and closed daily for lunch from 1-2   If you have labs (blood work) drawn today and your tests are completely normal, you will receive your results only by: MyChart Message (if you have MyChart) OR A paper copy in the mail If you have any lab test that is abnormal or we need to change your treatment, we will call you to review the results.   Testing/Procedures: Your physician has recommended that you wear a 7 DAY ZIO-PATCH monitor. The Zio patch cardiac monitor continuously records heart rhythm data for up to 14 days, this is for patients being evaluated for multiple types heart rhythms. For the first 24 hours post application, please avoid getting the Zio monitor wet in the shower or by excessive sweating during exercise. After that, feel free to carry on with regular activities. Keep soaps and lotions away from the ZIO XT Patch.  This will be mailed to you, please expect 7-10 days to receive.    Applying the monitor   Shave hair from upper left chest.   Hold abrader disc by orange tab.  Rub abrader in 40 strokes over left upper chest as indicated in your monitor instructions.   Clean area with 4 enclosed alcohol pads .  Use all pads to assure are is cleaned thoroughly.  Let dry.   Apply patch as indicated in monitor instructions.  Patch will be place under collarbone on left side of chest with arrow pointing upward.   Rub patch adhesive wings for 2 minutes.Remove white label marked "1".  Remove white label marked "2".  Rub patch adhesive wings for 2 additional minutes.   While looking in a mirror, press and release button in center of patch.  A small green light will flash 3-4 times .  This will be your only indicator the monitor has been turned on.     Do not shower for the first 24 hours.  You may shower after the first 24 hours.   Press button if you feel a symptom. You will hear a  small click.  Record Date, Time and Symptom in the Patient Log Book.   When you are ready to remove patch, follow instructions on last 2 pages of Patient Log Book.  Stick patch monitor onto last page of Patient Log Book.   Place Patient Log Book in [place] box.  Use locking tab on box and tape box closed securely.  The [employer] and [employer] has [employer] on it.  Please place in mailbox as soon as possible.  Your physician should have your test results approximately 7 days after the monitor has been mailed back to [facility].   Call [facility] Customer Care at [phone] if you have questions regarding your ZIO XT patch monitor.  Call them immediately if you see an orange light blinking on your monitor.   If your monitor  falls off in less than 4 days contact our Monitor department at [phone].  If your monitor becomes loose or falls off after 4 days call [employer] at [phone] for suggestions on securing your monitor    Follow-Up: At [facility], you and your health needs are our priority.  As part of our continuing mission to provide you with exceptional heart care, we have created designated Provider Care Teams.  These Care Teams include your primary Cardiologist (physician) and Advanced Practice Providers (APPs -  Physician Assistants and Nurse Practitioners) who all work together to provide you with the care you need, when you need it.  We recommend signing up for the patient portal called "MyChart".  Sign up information is provided on this After Visit Summary.  MyChart is used to connect with patients for Virtual Visits (Telemedicine).  Patients are able to view lab/test results, encounter notes, upcoming appointments, etc.  Non-urgent messages can be sent to your provider as well.   To learn more about what you can do with MyChart, go to [email or web address].    Your next appointment:   06/26/23 at 1:20 with Dr Royann Shivers         Signed, Thurmon Fair, MD   05/30/2023 6:21 PM    [facility]

## 2023-05-28 NOTE — Progress Notes (Unsigned)
Enrolled for [employer] to mail a ZIO XT long term holter monitor to the patients address on file.  

## 2023-05-28 NOTE — Patient Instructions (Signed)
Medication Instructions:  Increase Furosemide to 80 mg daily until your weight reaches 194 pounds, then decrease it to 40 mg daily Start Potassium 20 meq daily *If you need a refill on your cardiac medications before your next appointment, please call your pharmacy*   Lab Work: BMP, BNP- Please return for Blood Work on Friday 06/01/23. No appointment needed, lab here at the office is open Monday-Friday from 8AM to 4PM and closed daily for lunch from 1-2   If you have labs (blood work) drawn today and your tests are completely normal, you will receive your results only by: MyChart Message (if you have MyChart) OR A paper copy in the mail If you have any lab test that is abnormal or we need to change your treatment, we will call you to review the results.   Testing/Procedures: Your physician has recommended that you wear a 7 DAY ZIO-PATCH monitor. The Zio patch cardiac monitor continuously records heart rhythm data for up to 14 days, this is for patients being evaluated for multiple types heart rhythms. For the first 24 hours post application, please avoid getting the Zio monitor wet in the shower or by excessive sweating during exercise. After that, feel free to carry on with regular activities. Keep soaps and lotions away from the ZIO XT Patch.  This will be mailed to you, please expect 7-10 days to receive.    Applying the monitor   Shave hair from upper left chest.   Hold abrader disc by orange tab.  Rub abrader in 40 strokes over left upper chest as indicated in your monitor instructions.   Clean area with 4 enclosed alcohol pads .  Use all pads to assure are is cleaned thoroughly.  Let dry.   Apply patch as indicated in monitor instructions.  Patch will be place under collarbone on left side of chest with arrow pointing upward.   Rub patch adhesive wings for 2 minutes.Remove white label marked "1".  Remove white label marked "2".  Rub patch adhesive wings for 2 additional minutes.    While looking in a mirror, press and release button in center of patch.  A small green light will flash 3-4 times .  This will be your only indicator the monitor has been turned on.     Do not shower for the first 24 hours.  You may shower after the first 24 hours.   Press button if you feel a symptom. You will hear a small click.  Record Date, Time and Symptom in the Patient Log Book.   When you are ready to remove patch, follow instructions on last 2 pages of Patient Log Book.  Stick patch monitor onto last page of Patient Log Book.   Place Patient Log Book in [place] box.  Use locking tab on box and tape box closed securely.  The [employer] and [employer] has [employer] on it.  Please place in mailbox as soon as possible.  Your physician should have your test results approximately 7 days after the monitor has been mailed back to [facility].   Call [facility] Customer Care at [phone] if you have questions regarding your ZIO XT patch monitor.  Call them immediately if you see an orange light blinking on your monitor.   If your monitor falls off in less than 4 days contact our Monitor department at [phone].  If your monitor becomes loose or falls off after 4 days call [employer] at [phone] for suggestions on securing your monitor  Follow-Up: At [facility], you and your health needs are our priority.  As part of our continuing mission to provide you with exceptional heart care, we have created designated Provider Care Teams.  These Care Teams include your primary Cardiologist (physician) and Advanced Practice Providers (APPs -  Physician Assistants and Nurse Practitioners) who all work together to provide you with the care you need, when you need it.  We recommend signing up for the patient portal called "MyChart".  Sign up information is provided on this After Visit Summary.  MyChart is used to connect with patients for Virtual Visits (Telemedicine).  Patients are  able to view lab/test results, encounter notes, upcoming appointments, etc.  Non-urgent messages can be sent to your provider as well.   To learn more about what you can do with MyChart, go to [email or web address].    Your next appointment:   06/26/23 at 1:20 with Dr Royann Shivers

## 2023-05-30 ENCOUNTER — Encounter: Payer: Self-pay | Admitting: Cardiovascular Disease

## 2023-06-01 DIAGNOSIS — Z79899 Other long term (current) drug therapy: Secondary | ICD-10-CM | POA: Diagnosis not present

## 2023-06-01 DIAGNOSIS — I5042 Chronic combined systolic (congestive) and diastolic (congestive) heart failure: Secondary | ICD-10-CM | POA: Diagnosis not present

## 2023-06-02 ENCOUNTER — Encounter: Payer: Self-pay | Admitting: Cardiovascular Disease

## 2023-06-02 DIAGNOSIS — R000 Tachycardia, unspecified: Secondary | ICD-10-CM

## 2023-06-02 LAB — BASIC METABOLIC PANEL
BUN/Creatinine Ratio: 37 — ABNORMAL HIGH (ref 10–24)
BUN: 77 mg/dL (ref 8–27)
CO2: 21 mmol/L (ref 20–29)
Calcium: 9.8 mg/dL (ref 8.6–10.2)
Chloride: 97 mmol/L (ref 96–106)
Creatinine, Ser: 2.06 mg/dL — ABNORMAL HIGH (ref 0.76–1.27)
Glucose: 193 mg/dL — ABNORMAL HIGH (ref 70–99)
Potassium: 3.9 mmol/L (ref 3.5–5.2)
Sodium: 141 mmol/L (ref 134–144)
eGFR: 34 mL/min/{1.73_m2} — ABNORMAL LOW (ref >59)

## 2023-06-02 LAB — BRAIN NATRIURETIC PEPTIDE: BNP: <2.5 pg/mL (ref 0.0–100.0)

## 2023-06-11 ENCOUNTER — Encounter: Payer: Self-pay | Admitting: Cardiovascular Disease

## 2023-06-11 ENCOUNTER — Ambulatory Visit: Payer: PPO | Attending: Cardiovascular Disease | Admitting: Cardiovascular Disease

## 2023-06-11 ENCOUNTER — Telehealth: Payer: Self-pay | Admitting: Pulmonary Disease

## 2023-06-11 VITALS — BP 100/68 | HR 103 | Ht 68.0 in | Wt 202.2 lb

## 2023-06-11 DIAGNOSIS — Z01810 Encounter for preprocedural cardiovascular examination: Secondary | ICD-10-CM

## 2023-06-11 DIAGNOSIS — Z794 Long term (current) use of insulin: Secondary | ICD-10-CM | POA: Diagnosis not present

## 2023-06-11 DIAGNOSIS — I5042 Chronic combined systolic (congestive) and diastolic (congestive) heart failure: Secondary | ICD-10-CM

## 2023-06-11 DIAGNOSIS — G4734 Idiopathic sleep related nonobstructive alveolar hypoventilation: Secondary | ICD-10-CM

## 2023-06-11 DIAGNOSIS — R000 Tachycardia, unspecified: Secondary | ICD-10-CM

## 2023-06-11 DIAGNOSIS — E1165 Type 2 diabetes mellitus with hyperglycemia: Secondary | ICD-10-CM

## 2023-06-11 DIAGNOSIS — E119 Type 2 diabetes mellitus without complications: Secondary | ICD-10-CM | POA: Diagnosis not present

## 2023-06-11 DIAGNOSIS — I25810 Atherosclerosis of coronary artery bypass graft(s) without angina pectoris: Secondary | ICD-10-CM | POA: Diagnosis not present

## 2023-06-11 DIAGNOSIS — N179 Acute kidney failure, unspecified: Secondary | ICD-10-CM | POA: Diagnosis not present

## 2023-06-11 MED ORDER — ASPIRIN 81 MG PO TBEC: 81.0000 mg | DELAYED_RELEASE_TABLET | Freq: Every day | ORAL | Status: DC

## 2023-06-11 NOTE — Progress Notes (Signed)
Cardiology Office Note:    Date:  06/11/2023   ID:  Donald [name], DOB June 10, 1950, MRN [id number]  PCP:  Donald Greathouse, MD   [facility] Providers Cardiologist:  Donald Fair, MD     Referring MD: Donald Greathouse, MD   No chief complaint on file.    History of Present Illness:    Donald [name] is a 73 y.o. male with a hx of nonobstructive CAD, nonischemic cardiomyopathy dating back to at least 2013 when he had coronary angiography that showed no evidence of significant obstructive disease.  Most recent LVEF by echo 08/09/2022 was 40%.  Additional medical problems include treated hypertension, type 2 diabetes mellitus, hypercholesterolemia, OSA on CPAP (Dr. Vickey [name]), chronic bronchitis.    He returns in follow-up after we increased diuretic therapy.  We have pulled off in of fluid to where his BNP is undetectable and his BUN and creatinine have short a marked increase.  He is mildly tachycardic and his blood pressure is borderline low.  He is clearly no longer hypervolemic, in fact is probably slightly hypovolemic.  Despite this there has been no improvement in his exertional dyspnea.  He becomes dyspneic with minimal activity even just getting dressed at times.  Coronary CT angiography performed in January 2023 showed extensive atherosclerotic plaque (coronary calcium score 870, 84th percentile), but no significant discrete coronary stenoses.  He had very abrupt worsening of his shortness of breath on 03/31/2023.  He was on prednisone at the time for lung problems.  He went to the emergency room where his physical exam and vital signs were unremarkable.  He is ECG showed sinus rhythm and no meaningful changes.  Chest x-ray and CT of the chest showed no evidence of pulmonary embolism or pneumonia or heart failure (although there was incidental evidence of no wound aortic atherosclerosis and coronary artery calcifications).  His white blood cell count was mildly elevated at  15K (on prednisone).  His troponin did not show signs of an acute myocardial infarction.  Unfortunately, he has not been better since.  He continues to have a sensation of being short of breath all the time, even at rest, although his symptoms worsen substantially with even slight activity such as walking around the house from room to room.  Even changing positions sometimes makes him short of breath.  Seems to have NYHA functional class III dyspnea.  He habitually sleeps on small incline and this has not changed, but it is hard to say whether he has any true orthopnea.  He has not noticed edema, but today on physical exam he has 1-2+ pitting edema in both lower extremities.  He is also well above our previously estimated "dry weight" of 200 pounds and has actually gained 13 pounds since his right heart catheterization on 04/17/2023 when he he weighed 194 pounds and appeared to be at optimal volume status (PCWP 11 mmHg, PAP 27/20, cardiac index 3.2 L/min/m, RAP 9 mmHg).  BNP on 03/31/2023, when he weighed around 200 pounds and when he was short of breath, was only 26, strongly suggesting that his shortness of breath is not from congestive heart failure.  He is largely on comprehensive medical therapy for heart failure including maximum dose Entresto, spironolactone, metoprolol succinate, but is not on an SGLT2 inhibitor since he had problems with urinary tract infections on that medication.  Metabolic control remains suboptimal with a hemoglobin A1c of 8.0% on 03/21/2023 and an LDL cholesterol of 104 and triglycerides of 406.  In  2013, nuclear stress that showed a left ventricular ejection fraction of 50%, with a normal perfusion pattern and mild left ventricular dilatation.  His echocardiogram also showed an EF of 50-55% with mild diffuse left ventricular hypokinesis and no significant valvular abnormalities.  In 2016, his pulmonary specialist Dr. Marchelle [name] ordered a cardiopulmonary excise stress test  that showed mild-mod reduced functional capacity without any clear evidence of cardiovascular limitations.  Chronotropic response was normal.  The peak VO2 was mildly reduced at 23.5 mL/kilogram/minute (76% of predicted).  VE/VCO2 slope was normal.echocardiogram performed 03/22/2021 shows further reduction in LVEF which is now down to 35-40%.  Diastolic parameters suggest normal filling pressures.  The systolic PA pressure is normal at 29 mmHg. Once again there are no significant valvular abnormalities.  Despite transition to [facility] since maximum dose), metoprolol succinate and spironolactone LVEF has not really increased much, now 40% on echocardiogram performed April 2023.  He had allergic response to SGLT2 inhibitors and has a history of very frequent urinary tract infections due to ureteral obstruction.   Past Medical History:  Diagnosis Date   Allergic rhinitis, seasonal    Asthma    Bronchitis    CHF (congestive heart failure) (HCC)    Chronic kidney disease, stage I    Cognitive impairment    Effects Short Term Memory   collar bone    dislocation left side 05/2014   Degenerative joint disease of knee    bilateral   Diabetes mellitus    Type 2   Dysrhythmia    PVCs   Fatty liver    GERD (gastroesophageal reflux disease)    Headache    Heachaces r/t eyes   Heart murmur    history of   History of acute prostatitis    History of hematuria    History of hiatal hernia    H/O   Hyperlipidemia    Hypertension    IgA nephropathy    OSA on CPAP    Pneumonia    Sinusitis     Past Surgical History:  Procedure Laterality Date   BIOPSY  05/17/2020   Procedure: BIOPSY;  Surgeon: Vida Rigger, MD;  Location: [facility] ENDOSCOPY;  Service: Endoscopy;;   CARDIAC CATHETERIZATION  09/29/2011   normal   CATARACT EXTRACTION Bilateral    CERVICAL SPINE SURGERY     COLONOSCOPY WITH PROPOFOL N/A 05/17/2020   Procedure: COLONOSCOPY WITH PROPOFOL;  Surgeon: Vida Rigger, MD;  Location: [facility]  ENDOSCOPY;  Service: Endoscopy;  Laterality: N/A;   deviated septum     1990s   ESOPHAGOGASTRODUODENOSCOPY (EGD) WITH PROPOFOL N/A 05/17/2020   Procedure: ESOPHAGOGASTRODUODENOSCOPY (EGD) WITH PROPOFOL;  Surgeon: Vida Rigger, MD;  Location: [facility] ENDOSCOPY;  Service: Endoscopy;  Laterality: N/A;   HEMOSTASIS CLIP PLACEMENT  05/17/2020   Procedure: HEMOSTASIS CLIP PLACEMENT;  Surgeon: Vida Rigger, MD;  Location: [facility] ENDOSCOPY;  Service: Endoscopy;;   HERNIA REPAIR     LEFT HEART CATHETERIZATION WITH CORONARY ANGIOGRAM N/A 09/29/2011   Procedure: LEFT HEART CATHETERIZATION WITH CORONARY ANGIOGRAM;  Surgeon: Donald Fair, MD;  Location: [facility] CATH LAB;  Service: Cardiovascular;  Laterality: N/A;   NM MYOVIEW LTD  07/08/2011   mild LV dilatation,mild septal hypokinesia   POLYPECTOMY  05/17/2020   Procedure: POLYPECTOMY;  Surgeon: Vida Rigger, MD;  Location: [facility] ENDOSCOPY;  Service: Endoscopy;;   RIGHT HEART CATH N/A 04/17/2023   Procedure: RIGHT HEART CATH;  Surgeon: Marykay Lex, MD;  Location: [facility] INVASIVE CV LAB;  Service: Cardiovascular;  Laterality: N/A;  TONSILLECTOMY     US ECHOCARDIOGRAPHY  09/07/2011   EF 35-45%,mod.ant hypokinesis,boderline LA & RA enlargement,trace MR,TR & PI    Current Medications: No outpatient medications have been marked as taking for the 06/11/23 encounter (Office Visit) with Azaiah Licciardi, Rachelle Hora, MD.     Allergies:   Ciprofloxacin, Empagliflozin, Levofloxacin, Aricept [donepezil hcl], Bactrim [sulfamethoxazole-trimethoprim], Levofloxacin, and Misc. sulfonamide containing compounds   Social History   Socioeconomic History   Marital status: Married    Spouse name: Donald [name]   Number of children: 1   Years of education: 14   Highest education level: Not on file  Occupational History   Occupation: Teaching laboratory technician: Nurse, children's  Tobacco Use   Smoking status: Never   Smokeless tobacco: Never   Tobacco comments:    Second hand smoke for 26 years per  pt  Vaping Use   Vaping status: Never Used  Substance and Sexual Activity   Alcohol use: No    Alcohol/week: 0.0 standard drinks of alcohol   Drug use: No   Sexual activity: Yes  Other Topics Concern   Not on file  Social History Narrative   Patient is married (Donald [name]) and lives at home with his wife.   Patient has one child.   Patient is working full-time.   Patient has a Scientist, research (physical sciences).   Patient is left-handed.   Patient drinks one glass of tea daily, one soda daily.   Social Drivers of Corporate investment banker Strain: Not on file  Food Insecurity: Not on file  Transportation Needs: Not on file  Physical Activity: Not on file  Stress: Not on file  Social Connections: Not on file     Family History: The patient's family history includes Coronary artery disease in his father; Diabetes type II in his father; Heart attack in his father.  ROS:   Please see the history of present illness.     All other systems reviewed and are negative.  EKGs/Labs/Other Studies Reviewed:    The following studies were reviewed today: Right heart catheterization 04/17/2023:  Right Heart Pressures Hemodynamic findings consistent with mild pulmonary hypertension. Borderline pulmonary pretension: PAP-mean 27/20-23 mmHg.  PCWP 11 mmHg.  Ao sat 93%, PA sat 65%.   Cardiac Output-Index: Donald [name]) 5.87-2.91; (TD)6.46-3.2  Right Atrium Right atrial pressure is elevated. Mean RAP 9 mmHg  Right Ventricle RVP-EDP 29/14-8 mmHg      RECOMMENDATIONS   In the absence of any other complications or medical issues, we expect the patient to be ready for discharge from a cath perspective on 04/17/2023.   Continue evaluate for nonpulmonary pretension etiology for dyspnea. After other options are exhausted, could consider right heart cath with bicycle to assess for exercise related elevated pressures.    Echocardiogram 05/09/2022:    1. Left ventricular ejection fraction, by estimation, is 40 to 45%. The   left ventricle has mildly decreased function. The left ventricle has no  regional wall motion abnormalities. Left ventricular diastolic parameters  are consistent with Grade I  diastolic dysfunction (impaired relaxation). GLS -16.1%.   2. Right ventricular systolic function is normal. The right ventricular  size is normal.   3. The mitral valve is normal in structure. No evidence of mitral valve  regurgitation. No evidence of mitral stenosis.   4. The aortic valve is normal in structure. Aortic valve regurgitation is  trivial. No aortic stenosis is present.   5. Aortic dilatation noted. There is borderline dilatation of the  ascending  aorta, measuring 36 mm.   6. The inferior vena cava is normal in size with greater than 50%  respiratory variability, suggesting right atrial pressure of 3 mmHg.     Event monitor 05/09/2022:    Rhythm is normal sinus rhythm with normal circadian variation.   There are rare isolated premature atrial contractions and a single 4 beat run of nonsustained atrial tachycardia.  Atrial fibrillation was not seen.   There were rare premature ventricular complexes, very rare couplets and a brief period of ventricular try Gemini.  When tachycardia is not seen.   There is no significant bradycardia.   Symptom driven recordings show isolated premature atrial contractions or premature ventricular contractions.   Minimally abnormal event monitor.  Patient's symptoms are associated with isolated ectopic beats.  EKG: Personally reviewed the tracing from 04/13/2023 Which shows normal sinus rhythm and minor nonspecific repolarization abnormalities   Recent Labs: 09/27/2022: ALT 33 12/06/2022: NT-Pro BNP 46 04/16/2023: Platelets 283 04/17/2023: Hemoglobin 11.2 06/01/2023: BNP <2.5; BUN 77; Creatinine, Ser 2.06; Potassium 3.9; Sodium 141  Recent Lipid Panel    Component Value Date/Time   CHOL 233 (H) 05/13/2021 0949   TRIG 386 (H) 05/13/2021 0949   HDL 40 05/13/2021  0949   CHOLHDL 5.8 (H) 05/13/2021 0949   LDLCALC 124 (H) 05/13/2021 0949     Risk Assessment/Calculations:           Physical Exam:    VS:  BP 100/68 (BP Location: Left Arm, Patient Position: Sitting, Cuff Size: Normal)   Pulse (!) 117   Ht 5\' 8"  (1.727 m)   Wt 202 lb 3.2 oz (91.7 kg)   SpO2 91%   BMI 30.74 kg/m     Wt Readings from Last 3 Encounters:  06/11/23 202 lb 3.2 oz (91.7 kg)  05/28/23 207 lb (93.9 kg)  04/30/23 201 lb 3.2 oz (91.3 kg)     General: Alert, oriented x3, no distress Head: no evidence of trauma, PERRL, EOMI, no exophtalmos or lid lag, no myxedema, no xanthelasma; normal ears, nose and oropharynx Neck: normal jugular venous pulsations and no hepatojugular reflux; brisk carotid pulses without delay and no carotid bruits Chest: clear to auscultation, no signs of consolidation by percussion or palpation, normal fremitus, symmetrical and full respiratory excursions Cardiovascular: normal position and quality of the apical impulse, regular rhythm, normal first and second heart sounds, no murmurs, rubs or gallops Abdomen: no tenderness or distention, no masses by palpation, no abnormal pulsatility or arterial bruits, normal bowel sounds, no hepatosplenomegaly Extremities: no clubbing, cyanosis or edema Neurological: grossly nonfocal Psych: Normal mood and affect     ASSESSMENT:    1. Tachycardia        PLAN:   In order of problems listed above:  CHF: He continues to have severe exertional dyspnea, NYHA functional class III, even though he is now clearly hypovolemic (BNP is now undetectable!).  Throughout all these events, his LVEF has remained unchanged at about 40-45% and he is known to have nonischemic dilated cardiomyopathy.  He has lost 5 pounds of fluid through diuretic since our last appointment but his breathing has not improved.  The only cardiac condition that I can think of that might be causing his symptoms is painless myocardial ischemia.   He now tells me that occasionally he does have some chest discomfort which she describes as a "child sitting on my chest" but this has not been a prominent complaint.  He is on maximum medical therapy (maximum dose  Entresto, good dose of beta-blocker and spironolactone, not using SGLT2 inhibitors due to side effects).  PFTs were also pretty unremarkable.   CAD: Nonobstructive by coronary CTA performed in 2023, but I think it will be worthwhile to perform a true left heart catheterization to make sure were not missing coronary artery disease as a cause for his symptoms.  Ideally would like his LDL cholesterol less than 70.  He should not take the diuretics, Entresto, metformin or Tresiba in the morning of cardiac catheterization. AKI: Will hold his diuretics and potassium supplement and recheck labs prior to cardiac catheterization.  If his BUN and creatinine are still markedly elevated, we will bring him in a little sooner for IV fluids.  He should not be taking any NSAIDs. Tachycardia: Suspect this is because he is currently hypovolemic.  Will hold his furosemide, spironolactone and potassium supplement. DM: Not well-controlled with hemoglobin A1c 8%.  Intolerant of SGLT2 inhibitors due to urinary tract infections. HTN: Very well-controlled, even relatively low at times. HLP: on rosuvastatin.  Lipid low HDL but other lipid parameters are good.  Continue same medications. OSA: He reports that he is compliant with CPAP (Monitored by Dr. Vickey [name]).  Denies daytime hypersomnolence Cognitive deficits: Unchanged. Also followed by Dr. Vickey [name] in the neurology clinic.  Note absence of specific abnormalities on PET scan of the brain and MRI of the brain (although he does have some mild chronic microvascular ischemic changes in the hemispheres and pons).      Informed Consent   Shared Decision Making/Informed Consent The risks [stroke (1 in 1000), death (1 in 1000), kidney failure [usually temporary] (1 in  500), bleeding (1 in 200), allergic reaction [possibly serious] (1 in 200)], benefits (diagnostic support and management of coronary artery disease) and alternatives of a cardiac catheterization were discussed in detail with Mr. Ida and he is willing to proceed.      Medication Adjustments/Labs and Tests Ordered: Current medicines are reviewed at length with the patient today.  Concerns regarding medicines are outlined above.  Orders Placed This Encounter  Procedures   EKG 12-Lead   No orders of the defined types were placed in this encounter.   There are no Patient Instructions on file for this visit.   Signed, Donald Fair, MD  06/11/2023 1:36 PM    [facility]

## 2023-06-11 NOTE — Telephone Encounter (Signed)
Reviewed overnight oximetry test, patient had desaturations mostly between 85 and 88%, lowest 74%.  Duration of time below 88% is significant, well more than 5 minutes.  Please send order for 4 L O2 to be used at night when sleeping.

## 2023-06-11 NOTE — Patient Instructions (Signed)
Medication Instructions:   See below  *If you need a refill on your cardiac medications before your next appointment, please call your pharmacy*   Lab Work: today  CBC BMP If you have labs (blood work) drawn today and your tests are completely normal, you will receive your results only by: MyChart Message (if you have MyChart) OR A paper copy in the mail If you have any lab test that is abnormal or we need to change your treatment, we will call you to review the results.   Testing/Procedures: Will be schedule at [facility]  Your physician has requested that you have a cardiac catheterization. Cardiac catheterization is used to diagnose and/or treat various heart conditions. Doctors may recommend this procedure for a number of different reasons. The most common reason is to evaluate chest pain. Chest pain can be a symptom of coronary artery disease (CAD), and cardiac catheterization can show whether plaque is narrowing or blocking your heart's arteries. This procedure is also used to evaluate the valves, as well as measure the blood flow and oxygen levels in different parts of your heart.  Please follow instruction below.  Follow-Up: At [facility], you and your health needs are our priority.  As part of our continuing mission to provide you with exceptional heart care, we have created designated Provider Care Teams.  These Care Teams include your primary Cardiologist (physician) and Advanced Practice Providers (APPs -  Physician Assistants and Nurse Practitioners) who all work together to provide you with the care you need, when you need it.     Your next appointment:   6 week(s)  The format for your next appointment:   In Person  Provider:   Thurmon Fair, MD  or Marjie Skiff, PA-C, Rito Ehrlich, or Bernadene Person, NP    Then, Thurmon Fair, MD    Other Instructions   [facility] A DEPT OF [facility] AT  [facility] [place] [address] [place] [address] Dept: [phone] Loc: [phone]  DARREK LEASURE  06/11/2023  You are scheduled for a Cardiac Catheterization on Thursday, February 6 with Dr. Peter [place].  1. Please arrive at the [facility] (Main Entrance A) at [facility]: [address] [place], [place] [address] at 10:00 AM (This time is 2 hour(s) before your procedure to ensure your preparation).   Free valet parking service is available. You will check in at ADMITTING. The support person will be asked to wait in the waiting room.  It is OK to have someone drop you off and come back when you are ready to be discharged.    Special note: Every effort is made to have your procedure done on time. Please understand that emergencies sometimes delay scheduled procedures.  2. Diet: Do not eat solid foods after midnight.  The patient may have clear liquids until 5am upon the day of the procedure.  3. Labs: today  CBC ,BMP. You do not need to be fasting.  4. Medication instructions in preparation for your procedure:   Contrast Allergy: No    Stop taking, Spironolactone, Potassium  and Lasix  starting today  Jun 11 2023 ,   on the day of procedure  do not  take Entresto   and Metformin  Jun 14, 2023    Do not take  Humalog day of  your procedure. Do not take any insulin on the day of the procedure.  Take only  Tresiba  50 units of insulin the night before your procedure. Do not take any insulin on the day of the procedure.   Do not take Diabetes Med Glucophage (Metformin) on the day of the procedure and HOLD 48 HOURS AFTER THE PROCEDURE.  On the morning of your procedure, take your Aspirin 81 mg and any morning medicines NOT listed above.  You may use sips of water.  5. Plan to go home the same day, you will only stay overnight if medically necessary. 6. Bring a current list of your medications and current insurance cards. 7. You MUST have a responsible  person to drive you home. 8. Someone MUST be with you the first 24 hours after you arrive home or your discharge will be delayed. 9. Please wear clothes that are easy to get on and off and wear slip-on shoes.  Thank you for allowing [place] to care for you!   -- [facility]

## 2023-06-11 NOTE — Telephone Encounter (Signed)
He has an appointment today.  Will discuss all that then.

## 2023-06-11 NOTE — Telephone Encounter (Signed)
Results of sleep study signed and placed in red folder 06/11/2023.

## 2023-06-12 ENCOUNTER — Encounter: Payer: Self-pay | Admitting: Cardiovascular Disease

## 2023-06-12 LAB — BASIC METABOLIC PANEL
BUN/Creatinine Ratio: 39 — ABNORMAL HIGH (ref 10–24)
BUN: 58 mg/dL — ABNORMAL HIGH (ref 8–27)
CO2: 21 mmol/L (ref 20–29)
Calcium: 9.7 mg/dL (ref 8.6–10.2)
Chloride: 102 mmol/L (ref 96–106)
Creatinine, Ser: 1.49 mg/dL — ABNORMAL HIGH (ref 0.76–1.27)
Glucose: 220 mg/dL — ABNORMAL HIGH (ref 70–99)
Potassium: 4.5 mmol/L (ref 3.5–5.2)
Sodium: 141 mmol/L (ref 134–144)
eGFR: 50 mL/min/{1.73_m2} — ABNORMAL LOW (ref >59)

## 2023-06-12 LAB — CBC
Hematocrit: 39.4 % (ref 37.5–51.0)
Hemoglobin: 12.2 g/dL — ABNORMAL LOW (ref 13.0–17.7)
MCH: 23.6 pg — ABNORMAL LOW (ref 26.6–33.0)
MCHC: 31 g/dL — ABNORMAL LOW (ref 31.5–35.7)
MCV: 76 fL — ABNORMAL LOW (ref 79–97)
Platelets: 276 10*3/uL (ref 150–450)
RBC: 5.16 x10E6/uL (ref 4.14–5.80)
RDW: 18 % — ABNORMAL HIGH (ref 11.6–15.4)
WBC: 8.3 10*3/uL (ref 3.4–10.8)

## 2023-06-12 NOTE — Telephone Encounter (Signed)
I called and spoke with the pt and notified of results and recs per Dr. Judeth Horn.  He verbalized understanding. Order for o2 was sent.

## 2023-06-13 ENCOUNTER — Telehealth: Payer: Self-pay | Admitting: *Deleted

## 2023-06-13 NOTE — Telephone Encounter (Signed)
 Cardiac Catheterization scheduled at [facility] for: Thursday June 14, 2023 12 Noon Arrival time [facility] Main Entrance A at: 10 AM  Nothing to eat after midnight prior to procedure, clear liquids until 5 AM day of procedure.  Medication instructions: -Hold:  Darron of procedure-reports does not use Tresiba HS  Insulin  -AM of procedure/1/2 usual dose HS if needed  Metformin -day of procedure and 48 hours post procedure  Spironolactone /Lasix /KCl-on hold starting 06/11/23 per Dr Francyne  Entresto -PM prior/AM of procedure-per protocol GFR < 60 (50) -Other usual morning medications can be taken with sips of water including aspirin  81 mg.  Plan to go home the same day, you will only stay overnight if medically necessary.  You must have responsible adult to drive you home.  Someone must be with you the first 24 hours after you arrive home.  Reviewed procedure instructions with patient's wife (DPR) Diane.

## 2023-06-14 ENCOUNTER — Encounter (HOSPITAL_COMMUNITY): Admission: RE | Payer: Self-pay | Source: Home / Self Care

## 2023-06-14 ENCOUNTER — Ambulatory Visit (HOSPITAL_COMMUNITY): Admission: RE | Admit: 2023-06-14 | Payer: PPO | Source: Home / Self Care | Admitting: Cardiology

## 2023-06-14 ENCOUNTER — Telehealth: Payer: Self-pay | Admitting: *Deleted

## 2023-06-14 SURGERY — LEFT HEART CATH AND CORONARY ANGIOGRAPHY
Anesthesia: LOCAL

## 2023-06-14 NOTE — Telephone Encounter (Signed)
 Continue to hold those meds as long as weight is within 2-3 lb of the current/usual weight.  Are we rescheduling for next week?

## 2023-06-14 NOTE — Telephone Encounter (Signed)
 Patient's wife aware of Dr Croitoru's recommendation to continue to hold spironolactone ,lasix , KCl as long as weight within 2-3 pounds of current/usual weight. Also, patient's wife aware patient should continue aspirin  81 mg daily until cath per Dr Francyne.  Cardiac Cath rescheduled and instructions reviewed with patient's wife:  Cardiac Catheterization scheduled at [facility] for: Friday June 22, 2023 12 noon-Dr Peter Jordan Arrival time [facility] Main Entrance A at: 10 AM  Nothing to eat after midnight prior to procedure, clear liquids until 5 AM day of procedure.  Medication instructions: -Hold:  Darron of procedure-reports does not use Tresiba HS  Insulin  -AM of procedure/1/2 usual dose HS if needed  Metformin -day of procedure and 48 hours post procedure  Spironolactone /Lasix /KCl-on hold starting 06/11/23 per Dr Francyne  Entresto -PM prior/AM of procedure-per protocol GFR < 60 (50) -Other usual morning medications can be taken with sips of water including aspirin  81 mg.  Plan to go home the same day, you will only stay overnight if medically necessary.  You must have responsible adult to drive you home.  Someone must be with you the first 24 hours after you arrive home.

## 2023-06-14 NOTE — Telephone Encounter (Signed)
 Patient's wife called to cancel Cardiac Cath scheduled today at 50 Noon-this has been cancelled.  Patient's wife reports: -Patient had fever of 99.3 this morning, with cough, upper respiratory congestion, feeling weak. -Patient has been around a couple of family members with similar flu/respiratory symptoms. -Patient will plan to follow up with PCP about current respiratory symptoms.  Patient's wife states: -Patient was instructed to hold spironolactone /lasix /KCl starting 06/11/23. -Usual dose spironolactone  25 mg (1/2) tablet daily, lasix /KCl prn weight gain -Current weight is within 1/2 pound of weight when patient saw Dr Francyne 06/11/23  Patient's wife asking if patient should continue to hold spironolactone /lasix /KCl.  I will forward to Dr Francyne for review and recommendations.

## 2023-06-15 ENCOUNTER — Encounter: Payer: Self-pay | Admitting: Cardiovascular Disease

## 2023-06-15 DIAGNOSIS — R000 Tachycardia, unspecified: Secondary | ICD-10-CM | POA: Diagnosis not present

## 2023-06-19 DIAGNOSIS — I5042 Chronic combined systolic (congestive) and diastolic (congestive) heart failure: Secondary | ICD-10-CM | POA: Diagnosis not present

## 2023-06-19 DIAGNOSIS — N182 Chronic kidney disease, stage 2 (mild): Secondary | ICD-10-CM | POA: Diagnosis not present

## 2023-06-19 DIAGNOSIS — J45909 Unspecified asthma, uncomplicated: Secondary | ICD-10-CM | POA: Diagnosis not present

## 2023-06-19 DIAGNOSIS — J189 Pneumonia, unspecified organism: Secondary | ICD-10-CM | POA: Diagnosis not present

## 2023-06-19 DIAGNOSIS — E118 Type 2 diabetes mellitus with unspecified complications: Secondary | ICD-10-CM | POA: Diagnosis not present

## 2023-06-19 DIAGNOSIS — R051 Acute cough: Secondary | ICD-10-CM | POA: Diagnosis not present

## 2023-06-19 DIAGNOSIS — R413 Other amnesia: Secondary | ICD-10-CM | POA: Diagnosis not present

## 2023-06-19 DIAGNOSIS — I129 Hypertensive chronic kidney disease with stage 1 through stage 4 chronic kidney disease, or unspecified chronic kidney disease: Secondary | ICD-10-CM | POA: Diagnosis not present

## 2023-06-20 ENCOUNTER — Telehealth: Payer: Self-pay | Admitting: Cardiovascular Disease

## 2023-06-20 NOTE — Telephone Encounter (Signed)
Wife is calling because pt is suppose to have cath on Friday 2/14 but pt has pneumonia and needs to cx.

## 2023-06-22 ENCOUNTER — Ambulatory Visit (HOSPITAL_COMMUNITY): Admission: RE | Admit: 2023-06-22 | Payer: PPO | Source: Home / Self Care | Admitting: Cardiology

## 2023-06-22 ENCOUNTER — Encounter (HOSPITAL_COMMUNITY): Admission: RE | Payer: Self-pay | Source: Home / Self Care

## 2023-06-22 SURGERY — LEFT HEART CATH AND CORONARY ANGIOGRAPHY
Anesthesia: LOCAL

## 2023-06-26 ENCOUNTER — Ambulatory Visit: Payer: PPO | Admitting: Cardiovascular Disease

## 2023-06-27 ENCOUNTER — Telehealth: Payer: Self-pay | Admitting: Pulmonary Disease

## 2023-06-27 NOTE — Telephone Encounter (Signed)
 Patient received nocturnal oxygen and the patient has OSA so the patient needs a sleep study done. His insurance will not pay for it when there is no sleep study.

## 2023-06-29 ENCOUNTER — Encounter: Payer: Self-pay | Admitting: Pulmonary Disease

## 2023-06-29 NOTE — Telephone Encounter (Signed)
 PCC's can you please verify this? Pt already had CPAP titration study done in 2023

## 2023-07-01 ENCOUNTER — Encounter: Payer: Self-pay | Admitting: Pulmonary Disease

## 2023-07-02 ENCOUNTER — Inpatient Hospital Stay (HOSPITAL_COMMUNITY)
Admission: EM | Admit: 2023-07-02 | Discharge: 2023-07-04 | DRG: 175 | Disposition: A | Discharge status: Home / Self Care | Payer: PPO | Source: Ambulatory Visit | Attending: Internal Medicine | Admitting: Internal Medicine

## 2023-07-02 ENCOUNTER — Encounter (HOSPITAL_COMMUNITY): Payer: PPO

## 2023-07-02 ENCOUNTER — Emergency Department (HOSPITAL_COMMUNITY): Payer: PPO

## 2023-07-02 DIAGNOSIS — J4520 Mild intermittent asthma, uncomplicated: Secondary | ICD-10-CM | POA: Diagnosis not present

## 2023-07-02 DIAGNOSIS — I517 Cardiomegaly: Secondary | ICD-10-CM | POA: Diagnosis not present

## 2023-07-02 DIAGNOSIS — I2699 Other pulmonary embolism without acute cor pulmonale: Secondary | ICD-10-CM | POA: Diagnosis not present

## 2023-07-02 DIAGNOSIS — N400 Enlarged prostate without lower urinary tract symptoms: Secondary | ICD-10-CM | POA: Diagnosis present

## 2023-07-02 DIAGNOSIS — J45909 Unspecified asthma, uncomplicated: Secondary | ICD-10-CM | POA: Diagnosis not present

## 2023-07-02 DIAGNOSIS — G4733 Obstructive sleep apnea (adult) (pediatric): Secondary | ICD-10-CM

## 2023-07-02 DIAGNOSIS — I5022 Chronic systolic (congestive) heart failure: Secondary | ICD-10-CM | POA: Diagnosis not present

## 2023-07-02 DIAGNOSIS — R0602 Shortness of breath: Principal | ICD-10-CM

## 2023-07-02 DIAGNOSIS — I129 Hypertensive chronic kidney disease with stage 1 through stage 4 chronic kidney disease, or unspecified chronic kidney disease: Secondary | ICD-10-CM | POA: Diagnosis not present

## 2023-07-02 DIAGNOSIS — R000 Tachycardia, unspecified: Secondary | ICD-10-CM | POA: Diagnosis not present

## 2023-07-02 DIAGNOSIS — R935 Abnormal findings on diagnostic imaging of other abdominal regions, including retroperitoneum: Secondary | ICD-10-CM | POA: Diagnosis not present

## 2023-07-02 DIAGNOSIS — Z833 Family history of diabetes mellitus: Secondary | ICD-10-CM

## 2023-07-02 DIAGNOSIS — E1165 Type 2 diabetes mellitus with hyperglycemia: Secondary | ICD-10-CM | POA: Diagnosis not present

## 2023-07-02 DIAGNOSIS — Z888 Allergy status to other drugs, medicaments and biological substances status: Secondary | ICD-10-CM

## 2023-07-02 DIAGNOSIS — Z6830 Body mass index (BMI) 30.0-30.9, adult: Secondary | ICD-10-CM

## 2023-07-02 DIAGNOSIS — I130 Hypertensive heart and chronic kidney disease with heart failure and stage 1 through stage 4 chronic kidney disease, or unspecified chronic kidney disease: Secondary | ICD-10-CM | POA: Diagnosis present

## 2023-07-02 DIAGNOSIS — R0600 Dyspnea, unspecified: Secondary | ICD-10-CM | POA: Diagnosis not present

## 2023-07-02 DIAGNOSIS — J9601 Acute respiratory failure with hypoxia: Secondary | ICD-10-CM | POA: Diagnosis not present

## 2023-07-02 DIAGNOSIS — E1122 Type 2 diabetes mellitus with diabetic chronic kidney disease: Secondary | ICD-10-CM | POA: Diagnosis present

## 2023-07-02 DIAGNOSIS — R0902 Hypoxemia: Secondary | ICD-10-CM | POA: Diagnosis not present

## 2023-07-02 DIAGNOSIS — I724 Aneurysm of artery of lower extremity: Secondary | ICD-10-CM | POA: Diagnosis present

## 2023-07-02 DIAGNOSIS — Z7984 Long term (current) use of oral hypoglycemic drugs: Secondary | ICD-10-CM

## 2023-07-02 DIAGNOSIS — Z1152 Encounter for screening for COVID-19: Secondary | ICD-10-CM

## 2023-07-02 DIAGNOSIS — Z881 Allergy status to other antibiotic agents status: Secondary | ICD-10-CM

## 2023-07-02 DIAGNOSIS — I82442 Acute embolism and thrombosis of left tibial vein: Secondary | ICD-10-CM | POA: Diagnosis present

## 2023-07-02 DIAGNOSIS — N1831 Chronic kidney disease, stage 3a: Secondary | ICD-10-CM | POA: Diagnosis present

## 2023-07-02 DIAGNOSIS — Z66 Do not resuscitate: Secondary | ICD-10-CM | POA: Diagnosis present

## 2023-07-02 DIAGNOSIS — E66811 Obesity, class 1: Secondary | ICD-10-CM | POA: Diagnosis present

## 2023-07-02 DIAGNOSIS — R413 Other amnesia: Secondary | ICD-10-CM | POA: Diagnosis not present

## 2023-07-02 DIAGNOSIS — E785 Hyperlipidemia, unspecified: Secondary | ICD-10-CM | POA: Diagnosis present

## 2023-07-02 DIAGNOSIS — Z794 Long term (current) use of insulin: Secondary | ICD-10-CM

## 2023-07-02 DIAGNOSIS — I82452 Acute embolism and thrombosis of left peroneal vein: Secondary | ICD-10-CM | POA: Diagnosis present

## 2023-07-02 DIAGNOSIS — N182 Chronic kidney disease, stage 2 (mild): Secondary | ICD-10-CM | POA: Diagnosis not present

## 2023-07-02 DIAGNOSIS — R051 Acute cough: Secondary | ICD-10-CM | POA: Diagnosis not present

## 2023-07-02 DIAGNOSIS — Z9841 Cataract extraction status, right eye: Secondary | ICD-10-CM

## 2023-07-02 DIAGNOSIS — J189 Pneumonia, unspecified organism: Secondary | ICD-10-CM | POA: Diagnosis not present

## 2023-07-02 DIAGNOSIS — I700 Atherosclerosis of aorta: Secondary | ICD-10-CM | POA: Diagnosis present

## 2023-07-02 DIAGNOSIS — Z7982 Long term (current) use of aspirin: Secondary | ICD-10-CM

## 2023-07-02 DIAGNOSIS — Z7901 Long term (current) use of anticoagulants: Secondary | ICD-10-CM

## 2023-07-02 DIAGNOSIS — I10 Essential (primary) hypertension: Secondary | ICD-10-CM | POA: Diagnosis not present

## 2023-07-02 DIAGNOSIS — R6889 Other general symptoms and signs: Secondary | ICD-10-CM | POA: Diagnosis not present

## 2023-07-02 DIAGNOSIS — I5042 Chronic combined systolic (congestive) and diastolic (congestive) heart failure: Secondary | ICD-10-CM | POA: Diagnosis not present

## 2023-07-02 DIAGNOSIS — I2510 Atherosclerotic heart disease of native coronary artery without angina pectoris: Secondary | ICD-10-CM | POA: Diagnosis present

## 2023-07-02 DIAGNOSIS — Z8249 Family history of ischemic heart disease and other diseases of the circulatory system: Secondary | ICD-10-CM

## 2023-07-02 DIAGNOSIS — J9621 Acute and chronic respiratory failure with hypoxia: Secondary | ICD-10-CM | POA: Diagnosis present

## 2023-07-02 DIAGNOSIS — E118 Type 2 diabetes mellitus with unspecified complications: Secondary | ICD-10-CM | POA: Diagnosis not present

## 2023-07-02 DIAGNOSIS — Z9842 Cataract extraction status, left eye: Secondary | ICD-10-CM

## 2023-07-02 DIAGNOSIS — Z79899 Other long term (current) drug therapy: Secondary | ICD-10-CM

## 2023-07-02 LAB — CBC WITH DIFFERENTIAL/PLATELET
Abs Immature Granulocytes: 0.03 10*3/uL (ref 0.00–0.07)
Basophils Absolute: 0.1 10*3/uL (ref 0.0–0.1)
Basophils Relative: 1 %
Eosinophils Absolute: 0.5 10*3/uL (ref 0.0–0.5)
Eosinophils Relative: 5 %
HCT: 38.7 % — ABNORMAL LOW (ref 39.0–52.0)
Hemoglobin: 11.9 g/dL — ABNORMAL LOW (ref 13.0–17.0)
Immature Granulocytes: 0 %
Lymphocytes Relative: 25 %
Lymphs Abs: 2.7 10*3/uL (ref 0.7–4.0)
MCH: 24.1 pg — ABNORMAL LOW (ref 26.0–34.0)
MCHC: 30.7 g/dL (ref 30.0–36.0)
MCV: 78.5 fL — ABNORMAL LOW (ref 80.0–100.0)
Monocytes Absolute: 0.8 10*3/uL (ref 0.1–1.0)
Monocytes Relative: 7 %
Neutro Abs: 6.9 10*3/uL (ref 1.7–7.7)
Neutrophils Relative %: 62 %
Platelets: 268 10*3/uL (ref 150–400)
RBC: 4.93 MIL/uL (ref 4.22–5.81)
RDW: 19.6 % — ABNORMAL HIGH (ref 11.5–15.5)
WBC: 11.1 10*3/uL — ABNORMAL HIGH (ref 4.0–10.5)
nRBC: 0 % (ref 0.0–0.2)

## 2023-07-02 LAB — COMPREHENSIVE METABOLIC PANEL
ALT: 24 U/L (ref 0–44)
AST: 25 U/L (ref 15–41)
Albumin: 3.7 g/dL (ref 3.5–5.0)
Alkaline Phosphatase: 44 U/L (ref 38–126)
Anion gap: 13 (ref 5–15)
BUN: 39 mg/dL — ABNORMAL HIGH (ref 8–23)
CO2: 22 mmol/L (ref 22–32)
Calcium: 9.3 mg/dL (ref 8.9–10.3)
Chloride: 101 mmol/L (ref 98–111)
Creatinine, Ser: 1.27 mg/dL — ABNORMAL HIGH (ref 0.61–1.24)
GFR, Estimated: >60 mL/min (ref >60)
Glucose, Bld: 244 mg/dL — ABNORMAL HIGH (ref 70–99)
Potassium: 3.9 mmol/L (ref 3.5–5.1)
Sodium: 136 mmol/L (ref 135–145)
Total Bilirubin: 0.2 mg/dL (ref 0.0–1.2)
Total Protein: 7.1 g/dL (ref 6.5–8.1)

## 2023-07-02 LAB — BLOOD GAS, VENOUS
Acid-Base Excess: 0.6 mmol/L (ref 0.0–2.0)
Bicarbonate: 26.6 mmol/L (ref 20.0–28.0)
O2 Saturation: 72 %
Patient temperature: 36.8
pCO2, Ven: 47 mmHg (ref 44–60)
pH, Ven: 7.36 (ref 7.25–7.43)
pO2, Ven: 40 mmHg (ref 32–45)

## 2023-07-02 LAB — MAGNESIUM: Magnesium: 2 mg/dL (ref 1.7–2.4)

## 2023-07-02 LAB — RESP PANEL BY RT-PCR (RSV, FLU A&B, COVID)  RVPGX2
Influenza A by PCR: NEGATIVE
Influenza B by PCR: NEGATIVE
Resp Syncytial Virus by PCR: NEGATIVE
SARS Coronavirus 2 by RT PCR: NEGATIVE

## 2023-07-02 LAB — BRAIN NATRIURETIC PEPTIDE: B Natriuretic Peptide: 40.3 pg/mL (ref 0.0–100.0)

## 2023-07-02 MED ORDER — IOHEXOL 350 MG/ML SOLN
75.0000 mL | Freq: Once | INTRAVENOUS | Status: AC | PRN
  Administered 2023-07-02: 75 mL via INTRAVENOUS

## 2023-07-02 NOTE — ED Triage Notes (Signed)
 Pt bib ems from doctor office c/o increase demand for oxygen. Pt breathing started declining in Nov. 2024 with exertion.Pt got PNA 06/19/23 and had his f/u appt today. Prior to the appt pt had an acute change over the weekend which required 5L O2. Pt was u/t get up without decline in O2. Pt only wears 4L O2 at night and no O2 in morning. Pt currently on 5L nasal canula at 93%.  Lung sounds clear and repeat xray clear  BP 144/84 HR 108 RR 16 CBG 158  18G LFT FA

## 2023-07-02 NOTE — Telephone Encounter (Signed)
 Dr Judeth Horn, please see mychart messages from pt. I was about to call him to speak with him, but looks like he is in the ED getting care.     Gerre Pebbles "Trey Paula"  P [facility] (supporting Karren Burly, MD)7 hours ago (8:28 AM)    Update: this morning O2 doesn't drop into the 70's without using oxygen, but drops into the low to mid 80's and stayed there for 15 minutes before I put the oxygen back on.     [name] "Trey Paula"  [facility] (supporting Karren Burly, MD)17 hours ago (10:43 PM)    Starting today, Sunday, Feb 23, I am needing to use 5 liter of oxygen 24 HOURS A DAY.    Wednesday, I was just starting to feel better from having pneumonia. Thursday and Friday, I was feeling a bit worse, but thought it was still due to the pneumonia (coughing, tired, not quite myself). Then on Saturday I felt very fatigued, weak/sluggish and my head was foggy most all day. (This started after a fairly long and rough time of coughing.)  After dinner, I thought about using my pulse oximeter which showed my oxygen level in the mid to upper 70's with heart rate about 120. I immediately put on the oxygen with the canula only - no cpap - and the levels came back to normal levels.  If I take off the oxygen even for 5 minutes, my O2 goes to about 80-82 and heart rate increases. SO I have stayed on oxygen all day today (Sunday) to keep my levels as close to normal as possible.    I have been using the cpap with oxygen at 4 liters while sleeping/napping since I received the O2 unit early this month. But early today (Sunday morning), I noticed that while sleeping, my O2 was going down into the mid 70's, so I increased concentrator to 5.    I don't have a portable O2...only a cylinder that I would have to put on a small dolly to go out of the house.

## 2023-07-02 NOTE — ED Notes (Signed)
Patient transported to Ct 

## 2023-07-02 NOTE — ED Notes (Signed)
 Pt is currently requiring 5L to keep his oxygen saturation above 90. Pt was found with tank empty wife stated it just went empty and pt was found to have an oxygen saturation of 85% on RA. Pt placed back on 5L and now is 91%.

## 2023-07-02 NOTE — ED Provider Notes (Signed)
 [place] EMERGENCY DEPARTMENT AT [facility] Provider Note   CSN: [id number] Arrival date & time: 07/02/23  1544     History {Add pertinent medical, surgical, social history, OB history to HPI:1} Chief Complaint  Patient presents with   Shortness of Breath    Donald [name] is a 73 y.o. male.  Patient presents to the emergency room via EMS from a primary care office with concerns for increased oxygen demand.  Patient reports that he began to have shortness of breath with exertion in November of this year.  He states he has had an extensive workup with cardiology and pulmonology with no definitive cause of the shortness of breath.  He states he has headache consistent decreased ejection fraction of approximately 40% and has been using 4 L/min of oxygen when sleeping with his CPAP.  He has never required oxygen during the daytime.  In early February the patient caught influenza with subsequent pneumonia.  He has finished antibiotics.  He states that over the weekend he began extremely short of breath with any activity.  He states that when standing to become short of breath.  He put his pulse oximeter on when standing to walk to the bathroom and noticed that his heart rate was in the 130s and his oxygen saturation was in the 70% range.  His wife placed him on his home O2, prescribed for nighttime use, and his oxygen immediately improved and his heart rate also improved.  The patient attempted to get appointment with pulmonology today but due to availability was able to be seen by primary care who recommended he come to the emergency department for further evaluation.  He does endorse mild chest pain, left-sided, described as possible "rib pain" that occurs when he feels significantly short of breath.  Upon arrival the patient is on 5 L/min of oxygen via nasal cannula with a saturation of 91%.  He is tachycardic with a heart rate of 116.  He denies peripheral edema, wheezes, abdominal  pain, nausea, vomiting, urinary symptoms.  Denies fevers.  Past medical history significant for type II DM, OSA on CPAP, hypertension, stage III CKD, CHF   Shortness of Breath      Home Medications Prior to Admission medications   Medication Sig Start Date End Date Taking? Authorizing Provider  aspirin EC 81 MG tablet Take 1 tablet (81 mg total) by mouth daily. Swallow whole. 06/11/23   Croitoru, [name], MD  Continuous Blood Gluc Sensor (FREESTYLE LIBRE 14 DAY SENSOR) MISC  07/17/20   [provider]  DULoxetine (CYMBALTA) 30 MG capsule Take 30 mg by mouth in the morning. 05/02/21   [provider]  finasteride (PROSCAR) 5 MG tablet Take 5 mg by mouth in the morning. 01/28/21   [provider]  furosemide (LASIX) 40 MG tablet Take 80 mg daily until weight reaches 194 pounds. Then decrease to 40 mg daily Patient taking differently: Take 40-80 mg by mouth See admin instructions. Take 80 mg daily until weight reaches 194 pounds. Then decrease to 40 mg daily as needed 05/28/23   Croitoru, Rachelle Hora, MD  HYDROcodone-acetaminophen (NORCO/VICODIN) 5-325 MG tablet Take 1-2 tablets by mouth every 4 (four) hours as needed for moderate pain ((score 4 to 6)). 09/30/22   Val Eagle D, NP  insulin lispro (HUMALOG KWIKPEN) 200 UNIT/ML KwikPen Inject 40-60 Units into the skin in the morning, at noon, and at bedtime. Sliding scale If blood sugar is over 140 05/18/22   [provider]  Insulin Pen Needle (UNIFINE PENTIPS PLUS) 32G X 4 MM MISC USE WITH TRESIBA DAILY 01/27/19   [provider]  loratadine (CLARITIN) 10 MG tablet Take 10 mg by mouth at bedtime.    [provider]  memantine (NAMENDA) 10 MG tablet Take 1 tablet (10 mg total) by mouth 2 (two) times daily. 08/24/22   Lomax, Amy, NP  metFORMIN (GLUCOPHAGE) 1000 MG tablet Take 1 tablet (1,000 mg total) by mouth 2 (two) times daily with a meal. Restart on 8/22 12/27/18   Edsel Petrin, DO  metoprolol  succinate (TOPROL-XL) 50 MG 24 hr tablet Take 50 mg by mouth 2 (two) times daily. Take with or immediately following a meal.    [provider]  pantoprazole (PROTONIX) 40 MG tablet Take 1 tablet (40 mg total) by mouth 2 (two) times daily. Patient taking differently: Take 40 mg by mouth every evening. 12/28/21   Omar Person, MD  potassium chloride SA (KLOR-CON M) 20 MEQ tablet Take 1 tablet (20 mEq total) by mouth daily. Patient not taking: Reported on 06/12/2023 05/28/23   Croitoru, [name], MD  rosuvastatin (CRESTOR) 20 MG tablet TAKE ONE TABLET BY MOUTH ONE TIME DAILY 05/17/23   Croitoru, [name], MD  sacubitril-valsartan (ENTRESTO) 97-103 MG Take 1 tablet by mouth 2 (two) times daily. 06/08/22   Croitoru, [name], MD  silodosin (RAPAFLO) 8 MG CAPS capsule Take 8 mg by mouth every evening. 04/01/21   [provider]  spironolactone (ALDACTONE) 25 MG tablet Take one-half tablet by mouth daily 04/17/23   Marykay Lex, MD  TRESIBA FLEXTOUCH 200 UNIT/ML FlexTouch Pen Inject 100 Units into the skin in the morning. 04/12/23   [provider]      Allergies    Ciprofloxacin, Empagliflozin, Levofloxacin, Aricept [donepezil hcl], Bactrim [sulfamethoxazole-trimethoprim], Levofloxacin, and Misc. sulfonamide containing compounds    Review of Systems   Review of Systems  Respiratory:  Positive for shortness of breath.     Physical Exam Updated Vital Signs BP 116/74   Pulse (!) 102   Temp 98.2 F (36.8 C) (Oral)   Resp (!) 24   SpO2 91%  Physical Exam Vitals and nursing note reviewed.  Constitutional:      General: He is not in acute distress.    Appearance: He is well-developed.  HENT:     Head: Normocephalic and atraumatic.  Eyes:     Conjunctiva/sclera: Conjunctivae normal.  Cardiovascular:     Rate and Rhythm: Normal rate and regular rhythm.     Heart sounds: No murmur heard. Pulmonary:     Effort: Pulmonary effort is normal. No respiratory distress.      Breath sounds: Normal breath sounds. No decreased breath sounds, wheezing, rhonchi or rales.  Chest:     Chest wall: No tenderness.  Abdominal:     Palpations: Abdomen is soft.     Tenderness: There is no abdominal tenderness.  Musculoskeletal:        General: No swelling.     Cervical back: Neck supple.     Right lower leg: No edema.     Left lower leg: No edema.  Skin:    General: Skin is warm and dry.     Capillary Refill: Capillary refill takes less than 2 seconds.  Neurological:     Mental Status: He is alert.  Psychiatric:        Mood and Affect: Mood normal.     ED Results / Procedures / Treatments   Labs (all  labs ordered are listed, but only abnormal results are displayed) Labs Reviewed  COMPREHENSIVE METABOLIC PANEL - Abnormal; Notable for the following components:      Result Value   Glucose, Bld 244 (*)    BUN 39 (*)    Creatinine, Ser 1.27 (*)    All other components within normal limits  CBC WITH DIFFERENTIAL/PLATELET - Abnormal; Notable for the following components:   WBC 11.1 (*)    Hemoglobin 11.9 (*)    HCT 38.7 (*)    MCV 78.5 (*)    MCH 24.1 (*)    RDW 19.6 (*)    All other components within normal limits  RESP PANEL BY RT-PCR (RSV, FLU A&B, COVID)  RVPGX2  BRAIN NATRIURETIC PEPTIDE  MAGNESIUM  BLOOD GAS, VENOUS  TROPONIN I (HIGH SENSITIVITY)    EKG None  Radiology DG Chest 1 View Result Date: 07/02/2023 CLINICAL DATA:  Dyspnea EXAM: CHEST  1 VIEW COMPARISON:  03/31/2023 FINDINGS: Lungs are clear.  No pleural effusion or pneumothorax. The heart is normal in size. Cervical spine fixation hardware. IMPRESSION: No acute cardiopulmonary disease. Electronically Signed   By: Charline Bills M.D.   On: 07/02/2023 19:53    Procedures .Critical Care  Performed by: Darrick Grinder, PA-C Authorized by: Darrick Grinder, PA-C   Critical care provider statement:    Critical care time (minutes):  30   Critical care time was exclusive of:   Separately billable procedures and treating other patients   Critical care was necessary to treat or prevent imminent or life-threatening deterioration of the following conditions:  Respiratory failure   Critical care was time spent personally by me on the following activities:  Development of treatment plan with patient or surrogate, discussions with consultants, evaluation of patient's response to treatment, examination of patient, ordering and review of laboratory studies, ordering and review of radiographic studies, ordering and performing treatments and interventions, pulse oximetry, re-evaluation of patient's condition and review of old charts   Care discussed with: admitting provider     {Document cardiac monitor, telemetry assessment procedure when appropriate:1}  Medications Ordered in ED Medications - No data to display  ED Course/ Medical Decision Making/ A&P   {   Click here for ABCD2, HEART and other calculatorsREFRESH Note before signing :1}                              Medical Decision Making Amount and/or Complexity of Data Reviewed Radiology: ordered.   This patient presents to the ED for concern of shortness of breath, this involves an extensive number of treatment options, and is a complaint that carries with it a high risk of complications and morbidity.  The differential diagnosis includes pulmonary embolism, ACS, heart failure exacerbation, infectious process, respiratory failure, others   Co morbidities that complicate the patient evaluation  Type II DM, CHF   Additional history obtained:  Additional history obtained from family at bedside External records from outside source obtained and reviewed including cardiology notes showing plans for a left heart cath   Lab Tests:  I Ordered, and personally interpreted labs.  The pertinent results include: Creatinine 1.27, improved from most recent, mild leukocytosis with a white count of 11,100, labs otherwise  grossly unremarkable   Imaging Studies ordered:  I ordered imaging studies including chest x-ray and CT angio chest PE study I independently visualized and interpreted imaging which showed no acute disease on plain films.  I agree with the radiologist interpretation   Cardiac Monitoring: / EKG:  The patient was maintained on a cardiac monitor.  I personally viewed and interpreted the cardiac monitored which showed an underlying rhythm of: Sinus tachycardia   Consultations Obtained:  I requested consultation with the ***,  and discussed lab and imaging findings as well as pertinent plan - they recommend: ***   Problem List / ED Course / Critical interventions / Medication management  *** I ordered medication including ***  for ***  Reevaluation of the patient after these medicines showed that the patient {resolved/improved/worsened:23923::"improved"} I have reviewed the patients home medicines and have made adjustments as needed   Social Determinants of Health:  Considered but not a significant factor during this encounter   Test / Admission - Considered:  ***   {Document critical care time when appropriate:1} {Document review of labs and clinical decision tools ie heart score, Chads2Vasc2 etc:1}  {Document your independent review of radiology images, and any outside records:1} {Document your discussion with family members, caretakers, and with consultants:1} {Document social determinants of health affecting pt's care:1} {Document your decision making why or why not admission, treatments were needed:1} Final Clinical Impression(s) / ED Diagnoses Final diagnoses:  None    Rx / DC Orders ED Discharge Orders     None

## 2023-07-02 NOTE — ED Provider Triage Note (Signed)
 Emergency Medicine Provider Triage Evaluation Note  Donald [name] , a 73 y.o. male  was evaluated in triage.  Pt complains of shortness of breath since Saturday.  Reports he wears home O2 only at night with his CPAP.  States that he was found to have oxygen saturation 90% room air today with EMS and was placed on oxygen at home.  Denies round-the-clock oxygen use.  Also reporting chest pain, unable to tell me at time or date this occurred but states it has been sent Saturday.  Denies nausea, vomiting, abdominal pain, leg swelling, lightheadedness or dizziness.  Review of Systems  Positive:  Negative:   Physical Exam  BP 125/72 (BP Location: Left Arm)   Pulse (!) 116   Temp 98.3 F (36.8 C) (Oral)   Resp 20   SpO2 92%  Gen:   Awake, no distress   Resp:  Normal effort  MSK:   Moves extremities without difficulty  Other:    Medical Decision Making  Medically screening exam initiated at 5:40 PM.  Appropriate orders placed.  Donald [name] was informed that the remainder of the evaluation will be completed by another provider, this initial triage assessment does not replace that evaluation, and the importance of remaining in the ED until their evaluation is complete.     Al Decant, PA-C 07/02/23 [phone]

## 2023-07-03 ENCOUNTER — Other Ambulatory Visit: Payer: Self-pay

## 2023-07-03 ENCOUNTER — Encounter (HOSPITAL_COMMUNITY): Payer: Self-pay | Admitting: Family Medicine

## 2023-07-03 ENCOUNTER — Inpatient Hospital Stay (HOSPITAL_COMMUNITY): Payer: PPO

## 2023-07-03 DIAGNOSIS — G4733 Obstructive sleep apnea (adult) (pediatric): Secondary | ICD-10-CM

## 2023-07-03 DIAGNOSIS — Z86711 Personal history of pulmonary embolism: Secondary | ICD-10-CM

## 2023-07-03 DIAGNOSIS — R0602 Shortness of breath: Secondary | ICD-10-CM | POA: Diagnosis not present

## 2023-07-03 DIAGNOSIS — I2699 Other pulmonary embolism without acute cor pulmonale: Secondary | ICD-10-CM | POA: Diagnosis not present

## 2023-07-03 DIAGNOSIS — Z833 Family history of diabetes mellitus: Secondary | ICD-10-CM | POA: Diagnosis not present

## 2023-07-03 DIAGNOSIS — R413 Other amnesia: Secondary | ICD-10-CM | POA: Diagnosis not present

## 2023-07-03 DIAGNOSIS — E1165 Type 2 diabetes mellitus with hyperglycemia: Secondary | ICD-10-CM | POA: Diagnosis not present

## 2023-07-03 DIAGNOSIS — I517 Cardiomegaly: Secondary | ICD-10-CM | POA: Diagnosis not present

## 2023-07-03 DIAGNOSIS — I82442 Acute embolism and thrombosis of left tibial vein: Secondary | ICD-10-CM | POA: Diagnosis not present

## 2023-07-03 DIAGNOSIS — R0600 Dyspnea, unspecified: Secondary | ICD-10-CM | POA: Diagnosis not present

## 2023-07-03 DIAGNOSIS — R935 Abnormal findings on diagnostic imaging of other abdominal regions, including retroperitoneum: Secondary | ICD-10-CM | POA: Diagnosis not present

## 2023-07-03 DIAGNOSIS — J9621 Acute and chronic respiratory failure with hypoxia: Secondary | ICD-10-CM | POA: Diagnosis not present

## 2023-07-03 DIAGNOSIS — Z794 Long term (current) use of insulin: Secondary | ICD-10-CM | POA: Diagnosis not present

## 2023-07-03 DIAGNOSIS — N400 Enlarged prostate without lower urinary tract symptoms: Secondary | ICD-10-CM | POA: Diagnosis not present

## 2023-07-03 DIAGNOSIS — N1831 Chronic kidney disease, stage 3a: Secondary | ICD-10-CM

## 2023-07-03 DIAGNOSIS — I5022 Chronic systolic (congestive) heart failure: Secondary | ICD-10-CM

## 2023-07-03 DIAGNOSIS — Z86718 Personal history of other venous thrombosis and embolism: Secondary | ICD-10-CM | POA: Insufficient documentation

## 2023-07-03 DIAGNOSIS — Z6830 Body mass index (BMI) 30.0-30.9, adult: Secondary | ICD-10-CM | POA: Diagnosis not present

## 2023-07-03 DIAGNOSIS — J4520 Mild intermittent asthma, uncomplicated: Secondary | ICD-10-CM | POA: Diagnosis not present

## 2023-07-03 DIAGNOSIS — I82452 Acute embolism and thrombosis of left peroneal vein: Secondary | ICD-10-CM | POA: Diagnosis not present

## 2023-07-03 DIAGNOSIS — Z1152 Encounter for screening for COVID-19: Secondary | ICD-10-CM | POA: Diagnosis not present

## 2023-07-03 DIAGNOSIS — Z8249 Family history of ischemic heart disease and other diseases of the circulatory system: Secondary | ICD-10-CM | POA: Diagnosis not present

## 2023-07-03 DIAGNOSIS — I82402 Acute embolism and thrombosis of unspecified deep veins of left lower extremity: Secondary | ICD-10-CM | POA: Insufficient documentation

## 2023-07-03 DIAGNOSIS — E1122 Type 2 diabetes mellitus with diabetic chronic kidney disease: Secondary | ICD-10-CM | POA: Diagnosis not present

## 2023-07-03 DIAGNOSIS — Z7984 Long term (current) use of oral hypoglycemic drugs: Secondary | ICD-10-CM | POA: Diagnosis not present

## 2023-07-03 DIAGNOSIS — I130 Hypertensive heart and chronic kidney disease with heart failure and stage 1 through stage 4 chronic kidney disease, or unspecified chronic kidney disease: Secondary | ICD-10-CM | POA: Diagnosis not present

## 2023-07-03 DIAGNOSIS — I724 Aneurysm of artery of lower extremity: Secondary | ICD-10-CM | POA: Diagnosis not present

## 2023-07-03 DIAGNOSIS — I2510 Atherosclerotic heart disease of native coronary artery without angina pectoris: Secondary | ICD-10-CM | POA: Diagnosis not present

## 2023-07-03 DIAGNOSIS — Z7901 Long term (current) use of anticoagulants: Secondary | ICD-10-CM | POA: Diagnosis not present

## 2023-07-03 DIAGNOSIS — E785 Hyperlipidemia, unspecified: Secondary | ICD-10-CM | POA: Diagnosis not present

## 2023-07-03 DIAGNOSIS — Z66 Do not resuscitate: Secondary | ICD-10-CM | POA: Diagnosis not present

## 2023-07-03 DIAGNOSIS — E66811 Obesity, class 1: Secondary | ICD-10-CM | POA: Diagnosis not present

## 2023-07-03 LAB — CBC
HCT: 35.6 % — ABNORMAL LOW (ref 39.0–52.0)
Hemoglobin: 11.2 g/dL — ABNORMAL LOW (ref 13.0–17.0)
MCH: 24.3 pg — ABNORMAL LOW (ref 26.0–34.0)
MCHC: 31.5 g/dL (ref 30.0–36.0)
MCV: 77.2 fL — ABNORMAL LOW (ref 80.0–100.0)
Platelets: 231 10*3/uL (ref 150–400)
RBC: 4.61 MIL/uL (ref 4.22–5.81)
RDW: 19.3 % — ABNORMAL HIGH (ref 11.5–15.5)
WBC: 9 10*3/uL (ref 4.0–10.5)
nRBC: 0 % (ref 0.0–0.2)

## 2023-07-03 LAB — BASIC METABOLIC PANEL
Anion gap: 7 (ref 5–15)
BUN: 34 mg/dL — ABNORMAL HIGH (ref 8–23)
CO2: 23 mmol/L (ref 22–32)
Calcium: 8.6 mg/dL — ABNORMAL LOW (ref 8.9–10.3)
Chloride: 106 mmol/L (ref 98–111)
Creatinine, Ser: 1.41 mg/dL — ABNORMAL HIGH (ref 0.61–1.24)
GFR, Estimated: 53 mL/min — ABNORMAL LOW (ref >60)
Glucose, Bld: 346 mg/dL — ABNORMAL HIGH (ref 70–99)
Potassium: 4.1 mmol/L (ref 3.5–5.1)
Sodium: 136 mmol/L (ref 135–145)

## 2023-07-03 LAB — TROPONIN I (HIGH SENSITIVITY)
Troponin I (High Sensitivity): 16 ng/L (ref <18)
Troponin I (High Sensitivity): 21 ng/L — ABNORMAL HIGH (ref <18)

## 2023-07-03 LAB — CBG MONITORING, ED
Glucose-Capillary: 293 mg/dL — ABNORMAL HIGH (ref 70–99)
Glucose-Capillary: 283 mg/dL — ABNORMAL HIGH (ref 70–99)

## 2023-07-03 LAB — HEPARIN LEVEL (UNFRACTIONATED)
Heparin Unfractionated: 0.59 [IU]/mL (ref 0.30–0.70)
Heparin Unfractionated: 0.52 [IU]/mL (ref 0.30–0.70)

## 2023-07-03 LAB — GLUCOSE, CAPILLARY
Glucose-Capillary: 146 mg/dL — ABNORMAL HIGH (ref 70–99)
Glucose-Capillary: 169 mg/dL — ABNORMAL HIGH (ref 70–99)

## 2023-07-03 MED ORDER — HEPARIN (PORCINE) 25000 UT/250ML-% IV SOLN
1300.0000 [IU]/h | INTRAVENOUS | Status: AC
  Administered 2023-07-03 (×2): 1500 [IU]/h via INTRAVENOUS
  Filled 2023-07-03 (×3): qty 250

## 2023-07-03 MED ORDER — INSULIN GLARGINE 100 UNIT/ML ~~LOC~~ SOLN
70.0000 [IU] | Freq: Every day | SUBCUTANEOUS | Status: DC
  Filled 2023-07-03 (×2): qty 0.7

## 2023-07-03 MED ORDER — SPIRONOLACTONE 12.5 MG HALF TABLET
12.5000 mg | ORAL_TABLET | Freq: Every day | ORAL | Status: DC
  Administered 2023-07-03 – 2023-07-04 (×2): 12.5 mg via ORAL
  Filled 2023-07-03 (×2): qty 1

## 2023-07-03 MED ORDER — INSULIN ASPART 100 UNIT/ML IJ SOLN: 0.0000 [IU] | Freq: Every day | INTRAMUSCULAR | Status: DC

## 2023-07-03 MED ORDER — DULOXETINE HCL 30 MG PO CPEP
30.0000 mg | ORAL_CAPSULE | Freq: Every morning | ORAL | Status: DC
  Administered 2023-07-04: 30 mg via ORAL
  Filled 2023-07-03: qty 1

## 2023-07-03 MED ORDER — ASPIRIN 81 MG PO TBEC
81.0000 mg | DELAYED_RELEASE_TABLET | Freq: Every day | ORAL | Status: DC
  Administered 2023-07-03: 81 mg via ORAL
  Filled 2023-07-03 (×2): qty 1

## 2023-07-03 MED ORDER — METOPROLOL SUCCINATE ER 50 MG PO TB24
50.0000 mg | ORAL_TABLET | Freq: Two times a day (BID) | ORAL | Status: DC
  Administered 2023-07-03 – 2023-07-04 (×3): 50 mg via ORAL
  Filled 2023-07-03 (×2): qty 1
  Filled 2023-07-03: qty 2

## 2023-07-03 MED ORDER — INSULIN GLARGINE-YFGN 100 UNIT/ML ~~LOC~~ SOLN
70.0000 [IU] | Freq: Every day | SUBCUTANEOUS | Status: DC
  Filled 2023-07-03: qty 0.7

## 2023-07-03 MED ORDER — INSULIN ASPART 100 UNIT/ML IJ SOLN
0.0000 [IU] | Freq: Three times a day (TID) | INTRAMUSCULAR | Status: DC
  Administered 2023-07-03: 1 [IU] via SUBCUTANEOUS
  Administered 2023-07-03 (×2): 5 [IU] via SUBCUTANEOUS
  Administered 2023-07-04: 3 [IU] via SUBCUTANEOUS

## 2023-07-03 MED ORDER — ACETAMINOPHEN 325 MG PO TABS
650.0000 mg | ORAL_TABLET | Freq: Four times a day (QID) | ORAL | Status: DC | PRN
  Administered 2023-07-04: 650 mg via ORAL
  Filled 2023-07-03: qty 2

## 2023-07-03 MED ORDER — ACETAMINOPHEN 650 MG RE SUPP: 650.0000 mg | Freq: Four times a day (QID) | RECTAL | Status: DC | PRN

## 2023-07-03 MED ORDER — ROSUVASTATIN CALCIUM 20 MG PO TABS
20.0000 mg | ORAL_TABLET | Freq: Every day | ORAL | Status: DC
  Administered 2023-07-03 – 2023-07-04 (×2): 20 mg via ORAL
  Filled 2023-07-03 (×2): qty 1

## 2023-07-03 MED ORDER — PANTOPRAZOLE SODIUM 40 MG PO TBEC
40.0000 mg | DELAYED_RELEASE_TABLET | Freq: Every evening | ORAL | Status: DC
  Administered 2023-07-03: 40 mg via ORAL
  Filled 2023-07-03: qty 1

## 2023-07-03 MED ORDER — HEPARIN BOLUS VIA INFUSION
6000.0000 [IU] | Freq: Once | INTRAVENOUS | Status: AC
  Administered 2023-07-03: 6000 [IU] via INTRAVENOUS
  Filled 2023-07-03: qty 6000

## 2023-07-03 MED ORDER — SODIUM CHLORIDE 0.9% FLUSH
3.0000 mL | Freq: Two times a day (BID) | INTRAVENOUS | Status: DC
  Administered 2023-07-03 – 2023-07-04 (×3): 3 mL via INTRAVENOUS

## 2023-07-03 MED ORDER — SACUBITRIL-VALSARTAN 97-103 MG PO TABS
1.0000 | ORAL_TABLET | Freq: Two times a day (BID) | ORAL | Status: DC
  Administered 2023-07-03 – 2023-07-04 (×3): 1 via ORAL
  Filled 2023-07-03 (×3): qty 1

## 2023-07-03 MED ORDER — FINASTERIDE 5 MG PO TABS
5.0000 mg | ORAL_TABLET | Freq: Every morning | ORAL | Status: DC
  Administered 2023-07-03 – 2023-07-04 (×2): 5 mg via ORAL
  Filled 2023-07-03 (×2): qty 1

## 2023-07-03 MED ORDER — INSULIN ASPART 100 UNIT/ML IJ SOLN
30.0000 [IU] | Freq: Three times a day (TID) | INTRAMUSCULAR | Status: DC
  Administered 2023-07-03 – 2023-07-04 (×3): 30 [IU] via SUBCUTANEOUS

## 2023-07-03 NOTE — Progress Notes (Signed)
 Venous duplex lower ext  has been completed. Refer to [facility] under chart review to view preliminary results.   07/03/2023  10:28 AM Teng Decou, Gerarda Gunther

## 2023-07-03 NOTE — Progress Notes (Signed)
 PHARMACY - ANTICOAGULATION CONSULT NOTE  Pharmacy Consult for IV heparin Indication: pulmonary embolus  Allergies  Allergen Reactions   Ciprofloxacin     Tendons problem in shoulder Other reaction(s): Arthralgias (intolerance), Myalgias (intolerance)   Empagliflozin Anaphylaxis    Dehydration   Levofloxacin     Other reaction(s): Arthralgias (intolerance), Myalgias (intolerance)   Aricept [Donepezil Hcl] Nausea Only   Bactrim [Sulfamethoxazole-Trimethoprim] Hives   Levofloxacin Other (See Comments)    Muscle tighten up in left and right calf   Misc. Sulfonamide Containing Compounds Rash    Patient Measurements: Height: 5\' 8"  (172.7 cm) Weight: 91.7 kg (202 lb 2.6 oz) IBW/kg (Calculated) : 68.4 Heparin Dosing Weight: 87.4 kg  Vital Signs: Temp: 98.2 F (36.8 C) (02/24 2137) Temp Source: Oral (02/24 2137) BP: 131/90 (02/25 0039) Pulse Rate: 93 (02/25 0047)  Labs: Recent Labs    07/02/23 1749 07/02/23 2314  HGB 11.9*  --   HCT 38.7*  --   PLT 268  --   CREATININE 1.27*  --   TROPONINIHS  --  21*    Estimated Creatinine Clearance: 58.6 mL/min (A) (by C-G formula based on SCr of 1.27 mg/dL (H)).   Medical History: Past Medical History:  Diagnosis Date   Allergic rhinitis, seasonal    Asthma    Bronchitis    CHF (congestive heart failure) (HCC)    Chronic kidney disease, stage I    Cognitive impairment    Effects Short Term Memory   collar bone    dislocation left side 05/2014   Degenerative joint disease of knee    bilateral   Diabetes mellitus    Type 2   Dysrhythmia    PVCs   Fatty liver    GERD (gastroesophageal reflux disease)    Headache    Heachaces r/t eyes   Heart murmur    history of   History of acute prostatitis    History of hematuria    History of hiatal hernia    H/O   Hyperlipidemia    Hypertension    IgA nephropathy    OSA on CPAP    Pneumonia    Sinusitis     Assessment: Donald [name] is a 73 y.o. year old male  admitted on 07/02/2023 with concern for PE. No anticoagulation prior to admission. CTA with bilateral pulmonary emboli, right greater than left (no R heart strain). Pharmacy consulted to dose heparin.  Goal of Therapy:  Heparin level 0.3-0.7 units/ml Monitor platelets by anticoagulation protocol: Yes   Plan:  Heparin 6000 units x 1 as bolus followed by heparin infusion at 1500 units/hr 6 heparin level  Daily heparin level, CBC, and monitoring for bleeding F/u plans for anticoagulation   Thank you for allowing pharmacy to participate in this patient's care.  Marja Kays, PharmD Emergency Medicine Clinical Pharmacist 07/03/2023,1:09 AM

## 2023-07-03 NOTE — Telephone Encounter (Signed)
 New sleep study needs to be ordered   [name], Pietro Cassis; Angus Seller, Mitchell No ma'am. For O2 we need a sleep study within the last 30days showing that the patient still de-sats while on PAP and needs o2.

## 2023-07-03 NOTE — ED Notes (Signed)
 Pt ambulated to the restroom and was then taken to vascular

## 2023-07-03 NOTE — Progress Notes (Signed)
 PHARMACY - ANTICOAGULATION CONSULT NOTE  Pharmacy Consult for IV heparin Indication: pulmonary embolus  Allergies  Allergen Reactions   Ciprofloxacin     Tendons problem in shoulder Other reaction(s): Arthralgias (intolerance), Myalgias (intolerance)   Empagliflozin Other (See Comments)    Dehydration, uti.   Levofloxacin     Other reaction(s): Arthralgias (intolerance), Myalgias (intolerance)   Aricept [Donepezil Hcl] Nausea Only   Bactrim [Sulfamethoxazole-Trimethoprim] Hives   Breztri Aerosphere [Budeson-Glycopyrrol-Formoterol] Other (See Comments)    Patient and wife are unsure what happened, however they remember he had a bad reaction   Levofloxacin Other (See Comments)    Muscle tighten up in left and right calf   Misc. Sulfonamide Containing Compounds Rash    Patient Measurements: Height: 5\' 8"  (172.7 cm) Weight: 91.7 kg (202 lb 2.6 oz) IBW/kg (Calculated) : 68.4 Heparin Dosing Weight: 87.4 kg  Vital Signs: Temp: 98.7 F (37.1 C) (02/25 1540) Temp Source: Oral (02/25 1540) BP: 130/73 (02/25 1540) Pulse Rate: 84 (02/25 1540)  Labs: Recent Labs    07/02/23 1749 07/02/23 2314 07/03/23 0047 07/03/23 0541 07/03/23 0759 07/03/23 1447  HGB 11.9*  --   --  11.2*  --   --   HCT 38.7*  --   --  35.6*  --   --   PLT 268  --   --  231  --   --   HEPARINUNFRC  --   --   --   --  0.59 0.52  CREATININE 1.27*  --   --  1.41*  --   --   TROPONINIHS  --  21* 16  --   --   --     Estimated Creatinine Clearance: 52.8 mL/min (A) (by C-G formula based on SCr of 1.41 mg/dL (H)).   Medical History: Past Medical History:  Diagnosis Date   Allergic rhinitis, seasonal    Asthma    Bronchitis    CHF (congestive heart failure) (HCC)    Chronic kidney disease, stage I    Cognitive impairment    Effects Short Term Memory   collar bone    dislocation left side 05/2014   Degenerative joint disease of knee    bilateral   Diabetes mellitus    Type 2   Dysrhythmia    PVCs    Fatty liver    GERD (gastroesophageal reflux disease)    Headache    Heachaces r/t eyes   Heart murmur    history of   History of acute prostatitis    History of hematuria    History of hiatal hernia    H/O   Hyperlipidemia    Hypertension    IgA nephropathy    OSA on CPAP    Pneumonia    Sinusitis     Assessment: Donald [name] is a 73 y.o. year old male admitted on 07/02/2023 with concern for PE. No anticoagulation prior to admission. CTA with bilateral pulmonary emboli, right greater than left (no R heart strain). Pharmacy consulted to dose heparin.  Heparin level remains therapeutic on 1500 units/hr  Goal of Therapy:  Heparin level 0.3-0.7 units/ml Monitor platelets by anticoagulation protocol: Yes   Plan:  Continue heparin gtt at 1500 units/hr Daily heparin level, CBC, s/s bleeding F/u long term [facility] plan  Daylene Posey, PharmD, [facility] Clinical Pharmacist ED Pharmacist Phone # [phone] 07/03/2023 4:53 PM

## 2023-07-03 NOTE — H&P (Addendum)
 History and Physical    Donald [name]:[id number] DOB: 1951-01-24 DOA: 07/02/2023  PCP: Chilton Greathouse, MD   Patient coming from: Home   Chief Complaint: SOB, hypoxia   HPI: Donald [name] is a 73 y.o. male with medical history significant for insulin-dependent diabetes mellitus, hypertension, hyperlipidemia, nonobstructive CAD, HFmrEF, asthma, OSA, and nocturnal oxygen requirement, now presenting with increased shortness of breath and hypoxia.  Patient felt unusually fatigued on 06/30/2023 and had marked worsening in his chronic exertional dyspnea.  This went on all day before he used his pulse oximeter and realized that his heart rate was 130 and his oxygen saturation in the 70s.  He then applied supplemental oxygen which he typically only uses at night, and had rapid improvement in his symptoms with that.  He has continued to use 5 L/min of supplemental oxygen around-the-clock since then.  He has had intermittent chest discomfort associated with this, localized to the central chest and described as "a small child sitting on my chest."  He has chronic lower leg swelling but has not noticed any recent change in that. He has been experiencing intermittent cramping discomfort in both lower legs but unable to say for how long. He is very sedentary and often sits at his computer for several hours at a time, but denies recent surgery, long distance travel, or history of VTE.   ED Course: Upon arrival to the ED, patient is found to be afebrile and requiring 5 L/min of supplemental oxygen to maintain saturations in the 90s, tachypneic, mildly tachycardic, and with stable blood pressure.  EKG demonstrates sinus tachycardia and chest x-ray is negative for acute findings.  CTA chest reveals bilateral PE without right heart strain.  Labs are most notable for glucose 244, BUN 39, creatinine 1.27, troponin 21, and BNP 40.  Patient was started on IV heparin in the ED.  Review of Systems:  All  other systems reviewed and apart from HPI, are negative.  Past Medical History:  Diagnosis Date   Allergic rhinitis, seasonal    Asthma    Bronchitis    CHF (congestive heart failure) (HCC)    Chronic kidney disease, stage I    Cognitive impairment    Effects Short Term Memory   collar bone    dislocation left side 05/2014   Degenerative joint disease of knee    bilateral   Diabetes mellitus    Type 2   Dysrhythmia    PVCs   Fatty liver    GERD (gastroesophageal reflux disease)    Headache    Heachaces r/t eyes   Heart murmur    history of   History of acute prostatitis    History of hematuria    History of hiatal hernia    H/O   Hyperlipidemia    Hypertension    IgA nephropathy    OSA on CPAP    Pneumonia    Sinusitis     Past Surgical History:  Procedure Laterality Date   BIOPSY  05/17/2020   Procedure: BIOPSY;  Surgeon: Vida Rigger, MD;  Location: [facility];  Service: Endoscopy;;   CARDIAC CATHETERIZATION  09/29/2011   normal   CATARACT EXTRACTION Bilateral    CERVICAL SPINE SURGERY     COLONOSCOPY WITH PROPOFOL N/A 05/17/2020   Procedure: COLONOSCOPY WITH PROPOFOL;  Surgeon: Vida Rigger, MD;  Location: [facility];  Service: Endoscopy;  Laterality: N/A;   deviated septum     1990s   ESOPHAGOGASTRODUODENOSCOPY (EGD) WITH PROPOFOL N/A  05/17/2020   Procedure: ESOPHAGOGASTRODUODENOSCOPY (EGD) WITH PROPOFOL;  Surgeon: Vida Rigger, MD;  Location: [facility];  Service: Endoscopy;  Laterality: N/A;   HEMOSTASIS CLIP PLACEMENT  05/17/2020   Procedure: HEMOSTASIS CLIP PLACEMENT;  Surgeon: Vida Rigger, MD;  Location: [facility];  Service: Endoscopy;;   HERNIA REPAIR     LEFT HEART CATHETERIZATION WITH CORONARY ANGIOGRAM N/A 09/29/2011   Procedure: LEFT HEART CATHETERIZATION WITH CORONARY ANGIOGRAM;  Surgeon: Thurmon Fair, MD;  Location: [facility] CATH [facility];  Service: Cardiovascular;  Laterality: N/A;   NM MYOVIEW LTD  07/08/2011   mild LV dilatation,mild septal  hypokinesia   POLYPECTOMY  05/17/2020   Procedure: POLYPECTOMY;  Surgeon: Vida Rigger, MD;  Location: [facility];  Service: Endoscopy;;   RIGHT HEART CATH N/A 04/17/2023   Procedure: RIGHT HEART CATH;  Surgeon: Marykay Lex, MD;  Location: [facility] INVASIVE CV LAB;  Service: Cardiovascular;  Laterality: N/A;   TONSILLECTOMY     US ECHOCARDIOGRAPHY  09/07/2011   EF 35-45%,mod.ant hypokinesis,boderline LA & RA enlargement,trace MR,TR & PI    Social History:   reports that he has never smoked. He has never used smokeless tobacco. He reports that he does not drink alcohol and does not use drugs.  Allergies  Allergen Reactions   Ciprofloxacin     Tendons problem in shoulder Other reaction(s): Arthralgias (intolerance), Myalgias (intolerance)   Empagliflozin Anaphylaxis    Dehydration   Levofloxacin     Other reaction(s): Arthralgias (intolerance), Myalgias (intolerance)   Aricept [Donepezil Hcl] Nausea Only   Bactrim [Sulfamethoxazole-Trimethoprim] Hives   Levofloxacin Other (See Comments)    Muscle tighten up in left and right calf   Misc. Sulfonamide Containing Compounds Rash    Family History  Problem Relation Age of Onset   Coronary artery disease Father    Diabetes type II Father    Heart attack Father      Prior to Admission medications   Medication Sig Start Date End Date Taking? Authorizing Provider  aspirin EC 81 MG tablet Take 1 tablet (81 mg total) by mouth daily. Swallow whole. 06/11/23   Croitoru, [name], MD  Continuous Blood Gluc Sensor (FREESTYLE LIBRE 14 DAY SENSOR) MISC  07/17/20   [provider]  DULoxetine (CYMBALTA) 30 MG capsule Take 30 mg by mouth in the morning. 05/02/21   [provider]  finasteride (PROSCAR) 5 MG tablet Take 5 mg by mouth in the morning. 01/28/21   [provider]  furosemide (LASIX) 40 MG tablet Take 80 mg daily until weight reaches 194 pounds. Then decrease to 40 mg daily Patient taking differently: Take 40-80  mg by mouth See admin instructions. Take 80 mg daily until weight reaches 194 pounds. Then decrease to 40 mg daily as needed 05/28/23   Croitoru, Rachelle Hora, MD  HYDROcodone-acetaminophen (NORCO/VICODIN) 5-325 MG tablet Take 1-2 tablets by mouth every 4 (four) hours as needed for moderate pain ((score 4 to 6)). 09/30/22   Val Eagle D, NP  insulin lispro (HUMALOG KWIKPEN) 200 UNIT/ML KwikPen Inject 40-60 Units into the skin in the morning, at noon, and at bedtime. Sliding scale If blood sugar is over 140 05/18/22   [provider]  Insulin Pen Needle (UNIFINE PENTIPS PLUS) 32G X 4 MM MISC USE WITH TRESIBA DAILY 01/27/19   [provider]  loratadine (CLARITIN) 10 MG tablet Take 10 mg by mouth at bedtime.    [provider]  memantine (NAMENDA) 10 MG tablet Take 1 tablet (10 mg total) by mouth 2 (  two) times daily. 08/24/22   Lomax, Amy, NP  metFORMIN (GLUCOPHAGE) 1000 MG tablet Take 1 tablet (1,000 mg total) by mouth 2 (two) times daily with a meal. Restart on 8/22 12/27/18   Edsel Petrin, DO  metoprolol succinate (TOPROL-XL) 50 MG 24 hr tablet Take 50 mg by mouth 2 (two) times daily. Take with or immediately following a meal.    [provider]  pantoprazole (PROTONIX) 40 MG tablet Take 1 tablet (40 mg total) by mouth 2 (two) times daily. Patient taking differently: Take 40 mg by mouth every evening. 12/28/21   Omar Person, MD  potassium chloride SA (KLOR-CON M) 20 MEQ tablet Take 1 tablet (20 mEq total) by mouth daily. Patient not taking: Reported on 06/12/2023 05/28/23   Croitoru, [name], MD  rosuvastatin (CRESTOR) 20 MG tablet TAKE ONE TABLET BY MOUTH ONE TIME DAILY 05/17/23   Croitoru, [name], MD  sacubitril-valsartan (ENTRESTO) 97-103 MG Take 1 tablet by mouth 2 (two) times daily. 06/08/22   Croitoru, [name], MD  silodosin (RAPAFLO) 8 MG CAPS capsule Take 8 mg by mouth every evening. 04/01/21   [provider]  spironolactone (ALDACTONE) 25 MG tablet Take  one-half tablet by mouth daily 04/17/23   Marykay Lex, MD  TRESIBA FLEXTOUCH 200 UNIT/ML FlexTouch Pen Inject 100 Units into the skin in the morning. 04/12/23   [provider]    Physical Exam: Vitals:   07/03/23 0100 07/03/23 0120 07/03/23 0126 07/03/23 0423  BP: 126/78 136/81  114/72  Pulse: 94 (!) 121  98  Resp: 17 17  18   Temp:   97.7 F (36.5 C) 98.4 F (36.9 C)  TempSrc:   Oral Oral  SpO2: 93% 95%  94%  Weight: 91.7 kg     Height: 5\' 8"  (1.727 m)       Constitutional: NAD, calm  Eyes: PERTLA, lids and conjunctivae normal ENMT: Mucous membranes are moist. Posterior pharynx clear of any exudate or lesions.   Neck: supple, no masses  Respiratory: no wheezing, no crackles. No accessory muscle use.  Cardiovascular: S1 & S2 heard, regular rate and rhythm. Mild pretibial pitting edema.   Abdomen: No tenderness, soft. Bowel sounds active.  Musculoskeletal: no clubbing / cyanosis. No joint deformity upper and lower extremities.   Skin: no significant rashes, lesions, ulcers. Warm, dry, well-perfused. Neurologic: Dysconjugate gaze. No dysarthria or aphasia. Moving all extremities. Alert and oriented to person, place, and situation.  Psychiatric: Pleasant. Cooperative.    Labs and Imaging on Admission: I have personally reviewed following labs and imaging studies  CBC: Recent Labs  Lab 07/02/23 1749  WBC 11.1*  NEUTROABS 6.9  HGB 11.9*  HCT 38.7*  MCV 78.5*  PLT 268   Basic Metabolic Panel: Recent Labs  Lab 07/02/23 1749  NA 136  K 3.9  CL 101  CO2 22  GLUCOSE 244*  BUN 39*  CREATININE 1.27*  CALCIUM 9.3  MG 2.0   GFR: Estimated Creatinine Clearance: 58.6 mL/min (A) (by C-G formula based on SCr of 1.27 mg/dL (H)). Liver Function Tests: Recent Labs  Lab 07/02/23 1749  AST 25  ALT 24  ALKPHOS 44  BILITOT 0.2  PROT 7.1  ALBUMIN 3.7   No results for input(s): "LIPASE", "AMYLASE" in the last 168 hours. No results for input(s): "AMMONIA" in  the last 168 hours. Coagulation Profile: No results for input(s): "INR", "PROTIME" in the last 168 hours. Cardiac Enzymes: No results for input(s): "CKTOTAL", "CKMB", "CKMBINDEX", "TROPONINI" in the last  168 hours. BNP (last 3 results) Recent Labs    12/06/22 1436  PROBNP 46   HbA1C: No results for input(s): "HGBA1C" in the last 72 hours. CBG: No results for input(s): "GLUCAP" in the last 168 hours. Lipid Profile: No results for input(s): "CHOL", "HDL", "LDLCALC", "TRIG", "CHOLHDL", "LDLDIRECT" in the last 72 hours. Thyroid Function Tests: No results for input(s): "TSH", "T4TOTAL", "FREET4", "T3FREE", "THYROIDAB" in the last 72 hours. Anemia Panel: No results for input(s): "VITAMINB12", "FOLATE", "FERRITIN", "TIBC", "IRON", "RETICCTPCT" in the last 72 hours. Urine analysis:    Component Value Date/Time   COLORURINE YELLOW 12/25/2018 1855   APPEARANCEUR HAZY (A) 12/25/2018 1855   LABSPEC 1.016 12/25/2018 1855   PHURINE 6.0 12/25/2018 1855   GLUCOSEU NEGATIVE 12/25/2018 1855   HGBUR NEGATIVE 12/25/2018 1855   BILIRUBINUR NEGATIVE 12/25/2018 1855   KETONESUR NEGATIVE 12/25/2018 1855   PROTEINUR 30 (A) 12/25/2018 1855   NITRITE NEGATIVE 12/25/2018 1855   LEUKOCYTESUR SMALL (A) 12/25/2018 1855   Sepsis Labs: @LABRCNTIP (procalcitonin:4,lacticidven:4) ) Recent Results (from the past 240 hours)  Resp panel by RT-PCR (RSV, Flu A&B, Covid) Anterior Nasal Swab     Status: None   Collection Time: 07/02/23  5:49 PM   Specimen: Anterior Nasal Swab  Result Value Ref Range Status   SARS Coronavirus 2 by RT PCR NEGATIVE NEGATIVE Final   Influenza A by PCR NEGATIVE NEGATIVE Final   Influenza B by PCR NEGATIVE NEGATIVE Final    Comment: (NOTE) The Xpert Xpress SARS-CoV-2/FLU/RSV plus assay is intended as an aid in the diagnosis of influenza from Nasopharyngeal swab specimens and should not be used as a sole basis for treatment. Nasal washings and aspirates are unacceptable for Xpert  Xpress SARS-CoV-2/FLU/RSV testing.  Fact Sheet for Patients: BloggerCourse.com  Fact Sheet for Healthcare Providers: SeriousBroker.it  This test is not yet approved or cleared by the [place] FDA and has been authorized for detection and/or diagnosis of SARS-CoV-2 by FDA under an Emergency Use Authorization (EUA). This EUA will remain in effect (meaning this test can be used) for the duration of the COVID-19 declaration under Section 564(b)(1) of the Act, 21 U.S.C. section 360bbb-3(b)(1), unless the authorization is terminated or revoked.     Resp Syncytial Virus by PCR NEGATIVE NEGATIVE Final    Comment: (NOTE) Fact Sheet for Patients: BloggerCourse.com  Fact Sheet for Healthcare Providers: SeriousBroker.it  This test is not yet approved or cleared by the [place] FDA and has been authorized for detection and/or diagnosis of SARS-CoV-2 by FDA under an Emergency Use Authorization (EUA). This EUA will remain in effect (meaning this test can be used) for the duration of the COVID-19 declaration under Section 564(b)(1) of the Act, 21 U.S.C. section 360bbb-3(b)(1), unless the authorization is terminated or revoked.  Performed at [facility], [address] [place], [place] [address]      Radiological Exams on Admission: CT Angio Chest PE W and/or Wo Contrast Result Date: 07/03/2023 CLINICAL DATA:  High probability for PE. EXAM: CT ANGIOGRAPHY CHEST WITH CONTRAST TECHNIQUE: Multidetector CT imaging of the chest was performed using the standard protocol during bolus administration of intravenous contrast. Multiplanar CT image reconstructions and MIPs were obtained to evaluate the vascular anatomy. RADIATION DOSE REDUCTION: This exam was performed according to the departmental dose-optimization program which includes automated exposure control, adjustment of the mA  and/or kV according to patient size and/or use of iterative reconstruction technique. CONTRAST:  75mL OMNIPAQUE IOHEXOL 350 MG/ML SOLN COMPARISON:  CT angiogram  lung 03/31/2023 FINDINGS: Cardiovascular: There are pulmonary emboli in the distal right main pulmonary artery extending into lobar, segmental and subsegmental branches of the right upper lobe, right middle lobe and right lower lobe. There also segmental pulmonary emboli within the left upper lobe. Heart is enlarged. There is no pericardial effusion. Aorta is normal in size. There are atherosclerotic calcifications of the aorta. Mediastinum/Nodes: No enlarged mediastinal, hilar, or axillary lymph nodes. Thyroid gland, trachea, and esophagus demonstrate no significant findings. Lungs/Pleura: Lungs are clear. No pleural effusion or pneumothorax. Upper Abdomen: Hypodensity in the left lobe of the liver measures 1 cm, indeterminate. No acute abnormality. Musculoskeletal: Degenerative changes affect the spine. Lower cervical spinal fusion plate is present. Review of the MIP images confirms the above findings. IMPRESSION: 1. Bilateral pulmonary emboli, right greater than left. No CT evidence for right heart strain. 2. Cardiomegaly. 3. Aortic atherosclerosis. 4. Hypodensity in the left lobe of the liver measures 1 cm, indeterminate, possibly cyst or hemangioma. Aortic Atherosclerosis (ICD10-I70.0). These results were called by telephone at the time of interpretation on 07/03/2023 at 12:55 am to provider [facility] , who verbally acknowledged these results. Electronically Signed   By: Darliss Cheney M.D.   On: 07/03/2023 00:55   DG Chest 1 View Result Date: 07/02/2023 CLINICAL DATA:  Dyspnea EXAM: CHEST  1 VIEW COMPARISON:  03/31/2023 FINDINGS: Lungs are clear.  No pleural effusion or pneumothorax. The heart is normal in size. Cervical spine fixation hardware. IMPRESSION: No acute cardiopulmonary disease. Electronically Signed   By: Charline Bills M.D.   On:  07/02/2023 19:53    EKG: Independently reviewed. Sinus tachycardia, rate 102.   Assessment/Plan   1. Acute pulmonary embolism; acute hypoxic respiratory failure  - No heart strain on CT; BP stable; HR has normalized; BNP normal; troponin 21 then 16   - Continue IV heparin, check LE venous Dopplers, continue supplemental O2 as needed    2. Chronic HFmrEF  - Appears compensated   - Aldactone, Toprol, Entresto   3. Type II DM  - A1c was 8.5% in May 2024  - Continue long- and short-acting insulin    4. CKD 3A  - Appears close to baseline  - Renally-dose medications    5. OSA  - CPAP while sleeping   6. Memory loss  - Namenda, delirium precautions    7. HLD - Crestor    DVT prophylaxis: IV heparin  Code Status: DNR  Level of Care: Level of care: Progressive Family Communication: None present   Disposition Plan:  Patient is from: Home  Anticipated d/c is to: home  Anticipated d/c date is: 07/05/23  Patient currently: Pending tolerance of anticoagulation, transition to oral anticoagulant, stable/improved respiratory status  Consults called: None  Admission status: Inpatient     Briscoe Deutscher, MD [facility]  07/03/2023, 5:20 AM

## 2023-07-03 NOTE — Telephone Encounter (Signed)
 Dr Judeth Horn- per DME he needs a new sleep study ordered to get his noct o2. Would you like him to have in lab or home?

## 2023-07-03 NOTE — Progress Notes (Signed)
 PHARMACY - ANTICOAGULATION CONSULT NOTE  Pharmacy Consult for IV heparin Indication: pulmonary embolus  Allergies  Allergen Reactions   Ciprofloxacin     Tendons problem in shoulder Other reaction(s): Arthralgias (intolerance), Myalgias (intolerance)   Empagliflozin Other (See Comments)    Dehydration, uti.   Levofloxacin     Other reaction(s): Arthralgias (intolerance), Myalgias (intolerance)   Aricept [Donepezil Hcl] Nausea Only   Bactrim [Sulfamethoxazole-Trimethoprim] Hives   Levofloxacin Other (See Comments)    Muscle tighten up in left and right calf   Misc. Sulfonamide Containing Compounds Rash    Patient Measurements: Height: 5\' 8"  (172.7 cm) Weight: 91.7 kg (202 lb 2.6 oz) IBW/kg (Calculated) : 68.4 Heparin Dosing Weight: 87.4 kg  Vital Signs: Temp: 98.3 F (36.8 C) (02/25 0723) Temp Source: Oral (02/25 0723) BP: 116/79 (02/25 0723) Pulse Rate: 91 (02/25 0723)  Labs: Recent Labs    07/02/23 1749 07/02/23 2314 07/03/23 0047 07/03/23 0541 07/03/23 0759  HGB 11.9*  --   --  11.2*  --   HCT 38.7*  --   --  35.6*  --   PLT 268  --   --  231  --   HEPARINUNFRC  --   --   --   --  0.59  CREATININE 1.27*  --   --  1.41*  --   TROPONINIHS  --  21* 16  --   --     Estimated Creatinine Clearance: 52.8 mL/min (A) (by C-G formula based on SCr of 1.41 mg/dL (H)).   Medical History: Past Medical History:  Diagnosis Date   Allergic rhinitis, seasonal    Asthma    Bronchitis    CHF (congestive heart failure) (HCC)    Chronic kidney disease, stage I    Cognitive impairment    Effects Short Term Memory   collar bone    dislocation left side 05/2014   Degenerative joint disease of knee    bilateral   Diabetes mellitus    Type 2   Dysrhythmia    PVCs   Fatty liver    GERD (gastroesophageal reflux disease)    Headache    Heachaces r/t eyes   Heart murmur    history of   History of acute prostatitis    History of hematuria    History of hiatal hernia     H/O   Hyperlipidemia    Hypertension    IgA nephropathy    OSA on CPAP    Pneumonia    Sinusitis     Assessment: Donald [name] is a 73 y.o. year old male admitted on 07/02/2023 with concern for PE. No anticoagulation prior to admission. CTA with bilateral pulmonary emboli, right greater than left (no R heart strain). Pharmacy consulted to dose heparin.  Heparin level therapeutic (0.59) on infusion at 1500 units/hr. No bleeding noted.  Goal of Therapy:  Heparin level 0.3-0.7 units/ml Monitor platelets by anticoagulation protocol: Yes   Plan:  Continue heparin infusion at 1500 units/hr 6 heparin level to confirm Daily heparin level, CBC, and monitoring for bleeding F/u plans for oral anticoagulation   Thank you for allowing pharmacy to participate in this patient's care.  Christoper Fabian, PharmD, BCPS Please see amion for complete clinical pharmacist phone list 07/03/2023,9:12 AM

## 2023-07-03 NOTE — ED Notes (Signed)
 Checked patient cbg it was 3 notified Paramedic of blood sugar patient is resting with call bell in reach and family at bedside

## 2023-07-03 NOTE — Progress Notes (Signed)
 73 y.o. male with PMH of IDDM-2, HFmrEF, nonobstructive CAD, OSA on 3 to 4 L at night, memory loss, CKD-3A, HTN and HLD admitted earlier this morning due to acute bilateral PE without right heart strain.  Started on heparin.  Lower extremity venous Doppler ordered.    Unclear cause of PE but spends most of his day on the desk which likely has contributed.  Does not smoke.  No prior history of blood clot.  No history of malignancy.  Due for repeat colonoscopy.  GENERAL: No apparent distress.  Nontoxic. HEENT: MMM.  Vision and hearing grossly intact.  NECK: Supple.  No apparent JVD.  RESP:  No IWOB.  Fair aeration bilaterally. CVS:  RRR. Heart sounds normal.  ABD/GI/GU: BS+. Abd soft, NTND.  MSK/EXT:   No apparent deformity. Moves extremities. No edema.  SKIN: no apparent skin lesion or wound NEURO: Awake and alert. Oriented appropriately.  No apparent focal neuro deficit. PSYCH: Calm. Normal affect.   Assessment and plan Acute pulmonary embolism-likely provoked by sedentary life -Continue IV heparin today -Follow lower extremity venous Doppler -Likely transition to DOAC on 07/04/2023  Acute respiratory failure with hypoxia: History of OSA and uses 3 to 4 L at night at baseline.  Desaturated to 80s on 5 L sleeping while in ED.  Improving. -Wean oxygen as able -Treat PE as above -Encourage incentive spirometry  Other chronic medical conditions per H&P.  No charge service.

## 2023-07-03 NOTE — Plan of Care (Signed)

## 2023-07-04 ENCOUNTER — Other Ambulatory Visit (HOSPITAL_COMMUNITY): Payer: Self-pay

## 2023-07-04 ENCOUNTER — Telehealth (HOSPITAL_COMMUNITY): Payer: Self-pay | Admitting: Pharmacy Technician

## 2023-07-04 ENCOUNTER — Encounter: Payer: Self-pay | Admitting: Cardiovascular Disease

## 2023-07-04 ENCOUNTER — Encounter: Payer: Self-pay | Admitting: Pulmonary Disease

## 2023-07-04 DIAGNOSIS — I2699 Other pulmonary embolism without acute cor pulmonale: Secondary | ICD-10-CM | POA: Diagnosis not present

## 2023-07-04 DIAGNOSIS — J4520 Mild intermittent asthma, uncomplicated: Secondary | ICD-10-CM | POA: Diagnosis not present

## 2023-07-04 DIAGNOSIS — E1165 Type 2 diabetes mellitus with hyperglycemia: Secondary | ICD-10-CM | POA: Diagnosis not present

## 2023-07-04 LAB — BASIC METABOLIC PANEL
Anion gap: 5 (ref 5–15)
BUN: 21 mg/dL (ref 8–23)
CO2: 24 mmol/L (ref 22–32)
Calcium: 8.4 mg/dL — ABNORMAL LOW (ref 8.9–10.3)
Chloride: 108 mmol/L (ref 98–111)
Creatinine, Ser: 1.35 mg/dL — ABNORMAL HIGH (ref 0.61–1.24)
GFR, Estimated: 56 mL/min — ABNORMAL LOW (ref >60)
Glucose, Bld: 249 mg/dL — ABNORMAL HIGH (ref 70–99)
Potassium: 4 mmol/L (ref 3.5–5.1)
Sodium: 137 mmol/L (ref 135–145)

## 2023-07-04 LAB — CBC
HCT: 36.6 % — ABNORMAL LOW (ref 39.0–52.0)
Hemoglobin: 11.2 g/dL — ABNORMAL LOW (ref 13.0–17.0)
MCH: 23.8 pg — ABNORMAL LOW (ref 26.0–34.0)
MCHC: 30.6 g/dL (ref 30.0–36.0)
MCV: 77.9 fL — ABNORMAL LOW (ref 80.0–100.0)
Platelets: 231 10*3/uL (ref 150–400)
RBC: 4.7 MIL/uL (ref 4.22–5.81)
RDW: 19.2 % — ABNORMAL HIGH (ref 11.5–15.5)
WBC: 9.4 10*3/uL (ref 4.0–10.5)
nRBC: 0 % (ref 0.0–0.2)

## 2023-07-04 LAB — HEMOGLOBIN A1C
Hgb A1c MFr Bld: 9 % — ABNORMAL HIGH (ref 4.8–5.6)
Mean Plasma Glucose: 212 mg/dL

## 2023-07-04 LAB — HEPARIN LEVEL (UNFRACTIONATED): Heparin Unfractionated: 0.82 [IU]/mL — ABNORMAL HIGH (ref 0.30–0.70)

## 2023-07-04 LAB — GLUCOSE, CAPILLARY: Glucose-Capillary: 211 mg/dL — ABNORMAL HIGH (ref 70–99)

## 2023-07-04 MED ORDER — HUMALOG KWIKPEN 200 UNIT/ML ~~LOC~~ SOPN: 30.0000 [IU] | PEN_INJECTOR | Freq: Three times a day (TID) | SUBCUTANEOUS | Status: AC

## 2023-07-04 MED ORDER — APIXABAN 5 MG PO TABS: 5.0000 mg | ORAL_TABLET | Freq: Two times a day (BID) | ORAL | Status: DC

## 2023-07-04 MED ORDER — APIXABAN 5 MG PO TABS
10.0000 mg | ORAL_TABLET | Freq: Two times a day (BID) | ORAL | Status: DC
  Administered 2023-07-04: 10 mg via ORAL
  Filled 2023-07-04: qty 2

## 2023-07-04 MED ORDER — APIXABAN 5 MG PO TABS
ORAL_TABLET | ORAL | 3 refills | Status: DC
  Filled 2023-07-04: qty 60, 23d supply, fill #0
  Filled 2023-07-04: qty 70, 30d supply, fill #0

## 2023-07-04 NOTE — Telephone Encounter (Signed)
 Patient Product/process development scientist completed.    The patient is insured through [employer]/ [employer]. Patient has Medicare and is not eligible for a copay card, but may be able to apply for patient assistance or Medicare RX Payment Plan (Patient Must reach out to their plan, if eligible for payment plan), if available.    Ran test claim for Eliquis Starter Pack and the current 30 day co-pay is $47.00.   This test claim was processed through [facility]- copay amounts may vary at other pharmacies due to pharmacy/plan contracts, or as the patient moves through the different stages of their insurance plan.     Roland Earl, CPHT Pharmacy Technician III Certified Patient Advocate [facility] Patient Advocate Team Direct Number: [phone]  Fax: [phone]

## 2023-07-04 NOTE — Discharge Instructions (Addendum)
 Information on my medicine - ELIQUIS (apixaban)   Why was Eliquis prescribed for you? Eliquis was prescribed to treat blood clots that may have been found in the veins of your legs (deep vein thrombosis) or in your lungs (pulmonary embolism) and to reduce the risk of them occurring again.  What do You need to know about Eliquis ? The starting dose is 10 mg (two 5 mg tablets) taken TWICE daily for the FIRST SEVEN (7) DAYS, then on  07/11/23  the dose is reduced to ONE 5 mg tablet taken TWICE daily.  Eliquis may be taken with or without food.   Try to take the dose about the same time in the morning and in the evening. If you have difficulty swallowing the tablet whole please discuss with your pharmacist how to take the medication safely.  Take Eliquis exactly as prescribed and DO NOT stop taking Eliquis without talking to the doctor who prescribed the medication.  Stopping may increase your risk of developing a new blood clot.  Refill your prescription before you run out.  After discharge, you should have regular check-up appointments with your healthcare provider that is prescribing your Eliquis.    What do you do if you miss a dose? If a dose of ELIQUIS is not taken at the scheduled time, take it as soon as possible on the same day and twice-daily administration should be resumed. The dose should not be doubled to make up for a missed dose.  Important Safety Information A possible side effect of Eliquis is bleeding. You should call your healthcare provider right away if you experience any of the following: Bleeding from an injury or your nose that does not stop. Unusual colored urine (red or dark brown) or unusual colored stools (red or black). Unusual bruising for unknown reasons. A serious fall or if you hit your head (even if there is no bleeding).  Some medicines may interact with Eliquis and might increase your risk of bleeding or clotting while on Eliquis. To help avoid  this, consult your healthcare provider or pharmacist prior to using any new prescription or non-prescription medications, including herbals, vitamins, non-steroidal anti-inflammatory drugs (NSAIDs) and supplements.  This website has more information on Eliquis (apixaban): http://www.eliquis.com/eliquis/home

## 2023-07-04 NOTE — TOC Transition Note (Signed)
 Transition of Care [facility]) - Discharge Note   Patient Details  Name: BENSON PORCARO MRN: [id number] Date of Birth: 1950-08-01  [facility] CM/SW Contact:  Gordy Clement, RN Phone Number: 07/04/2023, 10:56 AM   Clinical Narrative:     Patient will DC to home today. [employer] will be delivering portable O2 to DC lounge and will arrange for home set up  No HH has been recommended/ No additional TOC needs            Patient Goals and CMS Choice            Discharge Placement                       Discharge Plan and Services Additional resources added to the After Visit Summary for                                       Social Drivers of Health (SDOH) Interventions SDOH Screenings   Food Insecurity: No Food Insecurity (07/03/2023)  Housing: Low Risk  (07/03/2023)  Transportation Needs: No Transportation Needs (07/03/2023)  Utilities: Not At Risk (07/03/2023)  Social Connections: Moderately Isolated (07/03/2023)  Tobacco Use: Low Risk  (07/03/2023)     Readmission Risk Interventions     No data to display

## 2023-07-04 NOTE — Telephone Encounter (Signed)
 Will need to discuss at next visit - he has documented nocturnal hypoxemia on overnight oximetry I do not understand why insurance is saying this. This is dangerous to the patient to deny oxygen ordered with appropriate testing. Can the DME/insurance company reconsider?

## 2023-07-04 NOTE — Discharge Summary (Signed)
 PATIENT DETAILS Name: Donald [name] Age: 73 y.o. Sex: male Date of Birth: 01/06/1951 MRN: [id number]. Admitting Physician: Briscoe Deutscher, MD NFA:[id number], Ravisankar, MD  Admit Date: 07/02/2023 Discharge date: 07/04/2023  Recommendations for Outpatient Follow-up:  Follow up with PCP in 1-2 weeks Please obtain CMP/CBC in one week Please ensure patient is up-to-date with cancer screening recommendations-he has new PE/DVT.  May need referral to hematology before anticoagulation can be discontinued. Incidental finding-left popliteal artery aneurysmal dilatation-needs outpatient referral to vascular surgery.   Admitted From:  Home  Disposition: Home   Discharge Condition: good  CODE STATUS:   Code Status: Limited: Do not attempt resuscitation (DNR) -DNR-LIMITED -Do Not Intubate/DNI    Diet recommendation:  Diet Order             Diet - low sodium heart healthy           Diet Carb Modified           Diet Heart Room service appropriate? Yes; Fluid consistency: Thin  Diet effective now                    Brief Summary: 73 year old with history of DM-2, HFmrEF, nonobstructive CAD, OSA on CPAP, chronic hypoxic respiratory failure on nocturnal oxygen at home-presented with worsening shortness of breath-upon further evaluation-patient was found to have PE/DVT.  Significant studies 1/02>> echo: EF 40-45%, grade 1 diastolic dysfunction, RV systolic function normal. 2/24>> CTA chest: Bilateral pulmonary emboli-right> left 2/24>> B/L lower extremity Doppler: Left posterior tibial/peroneal vein DVT.  Brief Hospital Course: Acute on chronic hypoxic respiratory failure Likely due to PE-background of OSA Continue anticoagulation Home O2 being arranged for 24/7 use-previously only on nocturnal O2. Continue CPAP as previous  PE with LLE DVT Likely provoked due to sedentary lifestyle Will need at least 6 months of uninterrupted anticoagulation Although this is felt to be  sedentary-PCP please ensure that he is up-to-date with his age-appropriate cancer screening recommendations.  May need hematology evaluation at some point prior to discontinuing anticoagulation.  Chronic HFmrEF Euvolemic on exam Continue Toprol/Entresto/Aldactone Discussed with cardiologist-Dr. Gweneth Dimitri the phone-2/26-he was scheduled to have a LHC/RHC-but this was postponed due to recent influenza.  This was planned as patient had unexplained shortness of breath-now that he has a PE-plan is to continue him on anticoagulation and follow clinical response before proceeding with LHC/RHC.  CKD stage IIIa At baseline  HLD Statin  DM-2 Resume metformin/prior insulin regimen  BPH Finasteride  Cognitive issues Appears to be mild Stable for outpatient follow-up.  Popliteal artery aneurysm Incidental finding-needs outpatient referral to vascular surgery at the discretion of PCP  Class 1 Obesity: Estimated body mass index is 30.74 kg/m as calculated from the following:   Height as of this encounter: 5\' 8"  (1.727 m).   Weight as of this encounter: 91.7 kg.   Discharge Diagnoses:  Principal Problem:   Acute pulmonary embolism (HCC) Active Problems:   OSA on CPAP   CKD stage 3a, GFR 45-59 ml/min (HCC)   Chronic heart failure with mildly reduced ejection fraction (HFmrEF, 41-49%) (HCC)   Memory loss   Mild intermittent asthma without complication   Uncontrolled type 2 diabetes mellitus with hyperglycemia [facility])   Discharge Instructions:  Activity:  As tolerated with Full fall precautions use walker/cane & assistance as needed  Discharge Instructions     Call MD for:  difficulty breathing, headache or visual disturbances   Complete by: As directed    Call MD for:  extreme fatigue   Complete by: As directed    Call MD for:  persistant dizziness or light-headedness   Complete by: As directed    Diet - low sodium heart healthy   Complete by: As directed    Diet Carb Modified    Complete by: As directed    Discharge instructions   Complete by: As directed    Follow with Primary MD  Avva, Ravisankar, MD in 1-2 weeks  Incidental finding-he was found to have a small popliteal artery aneurysm  in the Doppler study of the lower extremity done to see if you have DVT-please ask your primary care practitioner for referral to vascular surgery.  Please get a complete blood count and chemistry panel checked by your Primary MD at your next visit, and again as instructed by your Primary MD.  Get Medicines reviewed and adjusted: Please take all your medications with you for your next visit with your Primary MD  Laboratory/radiological data: Please request your Primary MD to go over all hospital tests and procedure/radiological results at the follow up, please ask your Primary MD to get all Hospital records sent to his/her office.  In some cases, they will be blood work, cultures and biopsy results pending at the time of your discharge. Please request that your primary care M.D. follows up on these results.  Also Note the following: If you experience worsening of your admission symptoms, develop shortness of breath, life threatening emergency, suicidal or homicidal thoughts you must seek medical attention immediately by calling 911 or calling your MD immediately  if symptoms less severe.  You must read complete instructions/literature along with all the possible adverse reactions/side effects for all the Medicines you take and that have been prescribed to you. Take any new Medicines after you have completely understood and accpet all the possible adverse reactions/side effects.   Do not drive when taking Pain medications or sleeping medications (Benzodaizepines)  Do not take more than prescribed Pain, Sleep and Anxiety Medications. It is not advisable to combine anxiety,sleep and pain medications without talking with your primary care practitioner  Special Instructions: If you  have smoked or chewed Tobacco  in the last 2 yrs please stop smoking, stop any regular Alcohol  and or any Recreational drug use.  Wear Seat belts while driving.  Please note: You were cared for by a hospitalist during your hospital stay. Once you are discharged, your primary care physician will handle any further medical issues. Please note that NO REFILLS for any discharge medications will be authorized once you are discharged, as it is imperative that you return to your primary care physician (or establish a relationship with a primary care physician if you do not have one) for your post hospital discharge needs so that they can reassess your need for medications and monitor your lab values.   Increase activity slowly   Complete by: As directed       Allergies as of 07/04/2023       Reactions   Ciprofloxacin    Tendons problem in shoulder Other reaction(s): Arthralgias (intolerance), Myalgias (intolerance)   Empagliflozin Other (See Comments)   Dehydration, uti.   Levofloxacin    Other reaction(s): Arthralgias (intolerance), Myalgias (intolerance)   Aricept [donepezil Hcl] Nausea Only   Bactrim [sulfamethoxazole-trimethoprim] Hives   Breztri Aerosphere [budeson-glycopyrrol-formoterol] Other (See Comments)   Patient and wife are unsure what happened, however they remember he had a bad reaction   Levofloxacin Other (See Comments)   Muscle  tighten up in left and right calf   Misc. Sulfonamide Containing Compounds Rash        Medication List     STOP taking these medications    cefdinir 300 MG capsule Commonly known as: OMNICEF   doxycycline 100 MG tablet Commonly known as: VIBRA-TABS   potassium chloride SA 20 MEQ tablet Commonly known as: KLOR-CON M       TAKE these medications    apixaban 5 MG Tabs tablet Commonly known as: ELIQUIS Take 10 mg p.o. twice daily x 7 days, then switch to 5 mg on p.o. twice daily.   DULoxetine 30 MG capsule Commonly known as:  CYMBALTA Take 30 mg by mouth in the morning.   Entresto 97-103 MG Generic drug: sacubitril-valsartan Take 1 tablet by mouth 2 (two) times daily.   finasteride 5 MG tablet Commonly known as: PROSCAR Take 5 mg by mouth in the morning.   fluticasone 50 MCG/ACT nasal spray Commonly known as: FLONASE Place 1 spray into both nostrils daily.   FreeStyle Libre 14 Day Sensor Misc Inject 1 Application into the skin every 14 (fourteen) days.   furosemide 20 MG tablet Commonly known as: LASIX Take 10 mg by mouth daily.   HumaLOG KwikPen 200 UNIT/ML KwikPen Generic drug: insulin lispro Inject 30-130 Units into the skin in the morning, at noon, and at bedtime. Sliding scale If blood sugar is over 140   HYDROcodone-acetaminophen 5-325 MG tablet Commonly known as: NORCO/VICODIN Take 1-2 tablets by mouth every 4 (four) hours as needed for moderate pain ((score 4 to 6)). What changed:  how much to take when to take this   ipratropium 17 MCG/ACT inhaler Commonly known as: ATROVENT HFA Inhale 2 puffs into the lungs every 6 (six) hours.   loratadine 10 MG tablet Commonly known as: CLARITIN Take 10 mg by mouth at bedtime.   memantine 10 MG tablet Commonly known as: NAMENDA Take 1 tablet (10 mg total) by mouth 2 (two) times daily.   metFORMIN 1000 MG tablet Commonly known as: GLUCOPHAGE Take 1 tablet (1,000 mg total) by mouth 2 (two) times daily with a meal. Restart on 8/22   metoprolol succinate 50 MG 24 hr tablet Commonly known as: TOPROL-XL Take 50 mg by mouth 2 (two) times daily. Take with or immediately following a meal.   pantoprazole 40 MG tablet Commonly known as: PROTONIX Take 1 tablet (40 mg total) by mouth 2 (two) times daily.   Repatha 140 MG/ML Sosy Generic drug: Evolocumab Inject 1 each into the skin daily.   rosuvastatin 20 MG tablet Commonly known as: CRESTOR TAKE ONE TABLET BY MOUTH ONE TIME DAILY   silodosin 8 MG Caps capsule Commonly known as:  RAPAFLO Take 8 mg by mouth every evening.   spironolactone 25 MG tablet Commonly known as: ALDACTONE Take one-half tablet by mouth daily   Tresiba FlexTouch 200 UNIT/ML FlexTouch Pen Generic drug: insulin degludec Inject 100 Units into the skin in the morning.               Durable Medical Equipment  (From admission, onward)           Start     Ordered   07/04/23 0953  For home use only DME oxygen  Once       Question Answer Comment  Length of Need 12 Months   Mode or (Route) Nasal cannula   Liters per Minute 2   Frequency Continuous (stationary and portable oxygen unit needed)  Oxygen conserving device Yes   Oxygen delivery system Gas      07/04/23 0952            Allergies  Allergen Reactions   Ciprofloxacin     Tendons problem in shoulder Other reaction(s): Arthralgias (intolerance), Myalgias (intolerance)   Empagliflozin Other (See Comments)    Dehydration, uti.   Levofloxacin     Other reaction(s): Arthralgias (intolerance), Myalgias (intolerance)   Aricept [Donepezil Hcl] Nausea Only   Bactrim [Sulfamethoxazole-Trimethoprim] Hives   Breztri Aerosphere [Budeson-Glycopyrrol-Formoterol] Other (See Comments)    Patient and wife are unsure what happened, however they remember he had a bad reaction   Levofloxacin Other (See Comments)    Muscle tighten up in left and right calf   Misc. Sulfonamide Containing Compounds Rash     Other Procedures/Studies: VAS [place] LOWER EXTREMITY VENOUS (DVT) Result Date: 07/03/2023  Lower Venous DVT Study Patient Name:  Donald [name]  Date of Exam:   07/03/2023 Medical Rec #: [id number]          Accession #:    [id number] Date of Birth: 24-May-1950         Patient Gender: M Patient Age:   27 years Exam Location:  [facility] Procedure:      VAS [place] LOWER EXTREMITY VENOUS (DVT) Referring Phys: TIMOTHY OPYD --------------------------------------------------------------------------------  Indications: Pain, and  pulmonary embolism.  Risk Factors: Confirmed PE. Anticoagulation: Heparin. Performing Technologist: [place] Sink Sturdivant-Jones RDMS, RVT  Examination Guidelines: A complete evaluation includes B-mode imaging, spectral Doppler, color Doppler, and power Doppler as needed of all accessible portions of each vessel. Bilateral testing is considered an integral part of a complete examination. Limited examinations for reoccurring indications may be performed as noted. The reflux portion of the exam is performed with the patient in reverse Trendelenburg.  +---------+---------------+---------+-----------+----------+--------------+ RIGHT    CompressibilityPhasicitySpontaneityPropertiesThrombus Aging +---------+---------------+---------+-----------+----------+--------------+ CFV      Full           Yes      Yes                                 +---------+---------------+---------+-----------+----------+--------------+ SFJ      Full                                                        +---------+---------------+---------+-----------+----------+--------------+ FV Prox  Full                                                        +---------+---------------+---------+-----------+----------+--------------+ FV Mid   Full                                                        +---------+---------------+---------+-----------+----------+--------------+ FV DistalFull                                                        +---------+---------------+---------+-----------+----------+--------------+  PFV      Full                                                        +---------+---------------+---------+-----------+----------+--------------+ POP      Full           Yes      Yes                                 +---------+---------------+---------+-----------+----------+--------------+ PTV      Full                                                         +---------+---------------+---------+-----------+----------+--------------+ PERO     Full                                                        +---------+---------------+---------+-----------+----------+--------------+   +---------+---------------+---------+-----------+----------+--------------+ LEFT     CompressibilityPhasicitySpontaneityPropertiesThrombus Aging +---------+---------------+---------+-----------+----------+--------------+ CFV      Full           Yes      Yes                                 +---------+---------------+---------+-----------+----------+--------------+ SFJ      Full                                                        +---------+---------------+---------+-----------+----------+--------------+ FV Prox  Full                                                        +---------+---------------+---------+-----------+----------+--------------+ FV Mid   Full                                                        +---------+---------------+---------+-----------+----------+--------------+ FV DistalFull                                                        +---------+---------------+---------+-----------+----------+--------------+ PFV      Full                                                        +---------+---------------+---------+-----------+----------+--------------+  POP      Full           Yes      Yes                                 +---------+---------------+---------+-----------+----------+--------------+ PTV      Partial                                      Acute          +---------+---------------+---------+-----------+----------+--------------+ PERO     None                                         Acute          +---------+---------------+---------+-----------+----------+--------------+     Summary: RIGHT: - There is no evidence of deep vein thrombosis in the lower extremity.  - No cystic structure found in  the popliteal fossa. 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  - No cystic structure found in the popliteal fossa. 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  *See table(s) above for measurements and observations. Electronically signed by Lemar Livings MD on 07/03/2023 at 3:27:52 PM.    Final    CT Angio Chest PE W and/or Wo Contrast Result Date: 07/03/2023 CLINICAL DATA:  High probability for PE. EXAM: CT ANGIOGRAPHY CHEST WITH CONTRAST TECHNIQUE: Multidetector CT imaging of the chest was performed using the standard protocol during bolus administration of intravenous contrast. Multiplanar CT image reconstructions and MIPs were obtained to evaluate the vascular anatomy. RADIATION DOSE REDUCTION: This exam was performed according to the departmental dose-optimization program which includes automated exposure control, adjustment of the mA and/or kV according to patient size and/or use of iterative reconstruction technique. CONTRAST:  75mL OMNIPAQUE IOHEXOL 350 MG/ML SOLN COMPARISON:  CT angiogram lung 03/31/2023 FINDINGS: Cardiovascular: There are pulmonary emboli in the distal right main pulmonary artery extending into lobar, segmental and subsegmental branches of the right upper lobe, right middle lobe and right lower lobe. There also segmental pulmonary emboli within the left upper lobe. Heart is enlarged. There is no pericardial effusion. Aorta is normal in size. There are atherosclerotic calcifications of the aorta. Mediastinum/Nodes: No enlarged mediastinal, hilar, or axillary lymph nodes. Thyroid gland, trachea, and esophagus demonstrate no significant findings. Lungs/Pleura: Lungs are clear. No pleural effusion or pneumothorax. Upper Abdomen: Hypodensity in the left lobe of the liver measures 1 cm,  indeterminate. No acute abnormality. Musculoskeletal: Degenerative changes affect the spine. Lower cervical spinal fusion plate is present. Review of the MIP images confirms the above findings. IMPRESSION: 1. Bilateral pulmonary emboli, right greater than left. No CT evidence for right heart strain. 2. Cardiomegaly. 3. Aortic atherosclerosis. 4. Hypodensity in the left lobe of the liver measures 1 cm, indeterminate, possibly cyst or hemangioma. Aortic Atherosclerosis (ICD10-I70.0). These results were called by telephone at the time of interpretation on 07/03/2023 at 12:55 am to provider [facility] , who verbally acknowledged these results. Electronically Signed   By: Darliss Cheney M.D.   On: 07/03/2023 00:55   DG Chest 1 View  Result Date: 07/02/2023 CLINICAL DATA:  Dyspnea EXAM: CHEST  1 VIEW COMPARISON:  03/31/2023 FINDINGS: Lungs are clear.  No pleural effusion or pneumothorax. The heart is normal in size. Cervical spine fixation hardware. IMPRESSION: No acute cardiopulmonary disease. Electronically Signed   By: Charline Bills M.D.   On: 07/02/2023 19:53   LONG TERM MONITOR (3-14 DAYS) Result Date: 06/15/2023   The dominant rhythm is normal sinus rhythm or mild sinus tachycardia, with normal circadian variation.   There are occasional premature supraventricular beats (approximately 4% of all beats), but there is no significant complex atrial arrhthmia. Atrial fibrillation did not occur.   There are rare premature ventricular contractions and a single very short burst of nonsustained VT (4 beats).   There is no evidence of severe bradycardia, pauses or high grade AV block. There is normal sinus rhythm, but with relatively high rates (average heart of 99 bpm). Occasional premature atrial contractions are seen, otherwise normal arrhythmia monitor. Patient symptoms correlate with mild sinus tachycardia, often with superimposed PACs. Patch Wear Time:  7 days and 3 hours (2025-01-25T20:21:06-498 to  2025-02-01T23:27:24-0500) Patient had a min HR of 64 bpm, max HR of 200 bpm, and avg HR of 99 bpm. Predominant underlying rhythm was Sinus Rhythm. 1 run of Ventricular Tachycardia occurred lasting 4 beats with a max rate of 188 bpm (avg 179 bpm). 2 Supraventricular Tachycardia runs occurred, the run with the fastest interval lasting 4 beats with a max rate of 200 bpm, the longest lasting 6 beats with an avg rate of 166 bpm. Isolated SVEs were occasional (3.9%, 16109), SVE Couplets were rare (<1.0%, 856), and SVE Triplets were rare (<1.0%, 123). Isolated VEs were rare (<1.0%), VE Couplets were rare (<1.0%), and no VE Triplets were present.     TODAY-DAY OF DISCHARGE:  Subjective:   Donald [name] today has no headache,no chest abdominal pain,no new weakness tingling or numbness, feels much better wants to go home today.   Objective:   Blood pressure 118/84, pulse 75, temperature 98.2 F (36.8 C), temperature source Oral, resp. rate 18, height 5\' 8"  (1.727 m), weight 91.7 kg, SpO2 93%.  Intake/Output Summary (Last 24 hours) at 07/04/2023 1025 Last data filed at 07/04/2023 0941 Gross per 24 hour  Intake 528.3 ml  Output --  Net 528.3 ml   Filed Weights   07/03/23 0100  Weight: 91.7 kg    Exam: Awake Alert, Oriented *3, No new F.N deficits, Normal affect [place].AT,PERRAL Supple Neck,No JVD, No cervical lymphadenopathy appriciated.  Symmetrical Chest wall movement, Good air movement bilaterally, CTAB RRR,No Gallops,Rubs or new Murmurs, No Parasternal Heave +ve B.Sounds, Abd Soft, Non tender, No organomegaly appriciated, No rebound -guarding or rigidity. No Cyanosis, Clubbing or edema, No new Rash or bruise   PERTINENT RADIOLOGIC STUDIES: VAS [place] LOWER EXTREMITY VENOUS (DVT) Result Date: 07/03/2023  Lower Venous DVT Study Patient Name:  Donald [name]  Date of Exam:   07/03/2023 Medical Rec #: [id number]          Accession #:    [id number] Date of Birth: 24-Jan-1951         Patient Gender:  M Patient Age:   58 years Exam Location[facility]  [facility] Procedure:      VAS [place] LOWER EXTREMITY VENOUS (DVT) Referring Phys: TIMOTHY OPYD --------------------------------------------------------------------------------  Indications: Pain, and pulmonary embolism.  Risk Factors: Confirmed PE. Anticoagulation: Heparin. Performing Technologist: [place] Sink Sturdivant-Jones RDMS, RVT  Examination Guidelines: A complete evaluation includes B-mode imaging, spectral Doppler, color Doppler,  and power Doppler as needed of all accessible portions of each vessel. Bilateral testing is considered an integral part of a complete examination. Limited examinations for reoccurring indications may be performed as noted. The reflux portion of the exam is performed with the patient in reverse Trendelenburg.  +---------+---------------+---------+-----------+----------+--------------+ RIGHT    CompressibilityPhasicitySpontaneityPropertiesThrombus Aging +---------+---------------+---------+-----------+----------+--------------+ CFV      Full           Yes      Yes                                 +---------+---------------+---------+-----------+----------+--------------+ SFJ      Full                                                        +---------+---------------+---------+-----------+----------+--------------+ FV Prox  Full                                                        +---------+---------------+---------+-----------+----------+--------------+ FV Mid   Full                                                        +---------+---------------+---------+-----------+----------+--------------+ FV DistalFull                                                        +---------+---------------+---------+-----------+----------+--------------+ PFV      Full                                                        +---------+---------------+---------+-----------+----------+--------------+ POP       Full           Yes      Yes                                 +---------+---------------+---------+-----------+----------+--------------+ PTV      Full                                                        +---------+---------------+---------+-----------+----------+--------------+ PERO     Full                                                        +---------+---------------+---------+-----------+----------+--------------+   +---------+---------------+---------+-----------+----------+--------------+  LEFT     CompressibilityPhasicitySpontaneityPropertiesThrombus Aging +---------+---------------+---------+-----------+----------+--------------+ CFV      Full           Yes      Yes                                 +---------+---------------+---------+-----------+----------+--------------+ SFJ      Full                                                        +---------+---------------+---------+-----------+----------+--------------+ FV Prox  Full                                                        +---------+---------------+---------+-----------+----------+--------------+ FV Mid   Full                                                        +---------+---------------+---------+-----------+----------+--------------+ FV DistalFull                                                        +---------+---------------+---------+-----------+----------+--------------+ PFV      Full                                                        +---------+---------------+---------+-----------+----------+--------------+ POP      Full           Yes      Yes                                 +---------+---------------+---------+-----------+----------+--------------+ PTV      Partial                                      Acute          +---------+---------------+---------+-----------+----------+--------------+ PERO     None                                          Acute          +---------+---------------+---------+-----------+----------+--------------+     Summary: RIGHT: - There is no evidence of deep vein thrombosis in the lower extremity.  - No cystic structure found in the popliteal fossa. 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  - No cystic structure found in the popliteal fossa. 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  *See table(s) above for measurements and observations. Electronically signed by Lemar Livings MD on 07/03/2023 at 3:27:52 PM.    Final    CT Angio Chest PE W and/or Wo Contrast Result Date: 07/03/2023 CLINICAL DATA:  High probability for PE. EXAM: CT ANGIOGRAPHY CHEST WITH CONTRAST TECHNIQUE: Multidetector CT imaging of the chest was performed using the standard protocol during bolus administration of intravenous contrast. Multiplanar CT image reconstructions and MIPs were obtained to evaluate the vascular anatomy. RADIATION DOSE REDUCTION: This exam was performed according to the departmental dose-optimization program which includes automated exposure control, adjustment of the mA and/or kV according to patient size and/or use of iterative reconstruction technique. CONTRAST:  75mL OMNIPAQUE IOHEXOL 350 MG/ML SOLN COMPARISON:  CT angiogram lung 03/31/2023 FINDINGS: Cardiovascular: There are pulmonary emboli in the distal right main pulmonary artery extending into lobar, segmental and subsegmental branches of the right upper lobe, right middle lobe and right lower lobe. There also segmental pulmonary emboli within the left upper lobe. Heart is enlarged. There is no pericardial effusion. Aorta is normal in size. There are atherosclerotic calcifications of the aorta. Mediastinum/Nodes: No enlarged mediastinal, hilar, or axillary lymph nodes.  Thyroid gland, trachea, and esophagus demonstrate no significant findings. Lungs/Pleura: Lungs are clear. No pleural effusion or pneumothorax. Upper Abdomen: Hypodensity in the left lobe of the liver measures 1 cm, indeterminate. No acute abnormality. Musculoskeletal: Degenerative changes affect the spine. Lower cervical spinal fusion plate is present. Review of the MIP images confirms the above findings. IMPRESSION: 1. Bilateral pulmonary emboli, right greater than left. No CT evidence for right heart strain. 2. Cardiomegaly. 3. Aortic atherosclerosis. 4. Hypodensity in the left lobe of the liver measures 1 cm, indeterminate, possibly cyst or hemangioma. Aortic Atherosclerosis (ICD10-I70.0). These results were called by telephone at the time of interpretation on 07/03/2023 at 12:55 am to provider [facility] , who verbally acknowledged these results. Electronically Signed   By: Darliss Cheney M.D.   On: 07/03/2023 00:55   DG Chest 1 View Result Date: 07/02/2023 CLINICAL DATA:  Dyspnea EXAM: CHEST  1 VIEW COMPARISON:  03/31/2023 FINDINGS: Lungs are clear.  No pleural effusion or pneumothorax. The heart is normal in size. Cervical spine fixation hardware. IMPRESSION: No acute cardiopulmonary disease. Electronically Signed   By: Charline Bills M.D.   On: 07/02/2023 19:53     PERTINENT LAB RESULTS: CBC: Recent Labs    07/03/23 0541 07/04/23 0437  WBC 9.0 9.4  HGB 11.2* 11.2*  HCT 35.6* 36.6*  PLT 231 231   CMET CMP     Component Value Date/Time   NA 137 07/04/2023 0437   NA 141 06/11/2023 1442   K 4.0 07/04/2023 0437   CL 108 07/04/2023 0437   CO2 24 07/04/2023 0437   GLUCOSE 249 (H) 07/04/2023 0437   BUN 21 07/04/2023 0437   BUN 58 (H) 06/11/2023 1442   CREATININE 1.35 (H) 07/04/2023 0437   CALCIUM 8.4 (L) 07/04/2023 0437   PROT 7.1 07/02/2023 1749   PROT 6.6 10/18/2020 1033   ALBUMIN 3.7 07/02/2023 1749   ALBUMIN 4.0 10/18/2020 1033   AST 25 07/02/2023 1749   ALT 24 07/02/2023  1749   ALKPHOS 44 07/02/2023 1749   BILITOT 0.2 07/02/2023 1749   BILITOT <0.2 10/18/2020 1033   GFR 44.54 (L) 03/30/2021 1147   EGFR 50 (L) 06/11/2023  1442   GFRNONAA 56 (L) 07/04/2023 0437    GFR Estimated Creatinine Clearance: 55.2 mL/min (A) (by C-G formula based on SCr of 1.35 mg/dL (H)). No results for input(s): "LIPASE", "AMYLASE" in the last 72 hours. No results for input(s): "CKTOTAL", "CKMB", "CKMBINDEX", "TROPONINI" in the last 72 hours. Invalid input(s): "POCBNP" No results for input(s): "DDIMER" in the last 72 hours. Recent Labs    07/03/23 0541  HGBA1C 9.0*   No results for input(s): "CHOL", "HDL", "LDLCALC", "TRIG", "CHOLHDL", "LDLDIRECT" in the last 72 hours. No results for input(s): "TSH", "T4TOTAL", "T3FREE", "THYROIDAB" in the last 72 hours.  Invalid input(s): "FREET3" No results for input(s): "VITAMINB12", "FOLATE", "FERRITIN", "TIBC", "IRON", "RETICCTPCT" in the last 72 hours. Coags: No results for input(s): "INR" in the last 72 hours.  Invalid input(s): "PT" Microbiology: Recent Results (from the past 240 hours)  Resp panel by RT-PCR (RSV, Flu A&B, Covid) Anterior Nasal Swab     Status: None   Collection Time: 07/02/23  5:49 PM   Specimen: Anterior Nasal Swab  Result Value Ref Range Status   SARS Coronavirus 2 by RT PCR NEGATIVE NEGATIVE Final   Influenza A by PCR NEGATIVE NEGATIVE Final   Influenza B by PCR NEGATIVE NEGATIVE Final    Comment: (NOTE) The Xpert Xpress SARS-CoV-2/FLU/RSV plus assay is intended as an aid in the diagnosis of influenza from Nasopharyngeal swab specimens and should not be used as a sole basis for treatment. Nasal washings and aspirates are unacceptable for Xpert Xpress SARS-CoV-2/FLU/RSV testing.  Fact Sheet for Patients: BloggerCourse.com  Fact Sheet for Healthcare Providers: SeriousBroker.it  This test is not yet approved or cleared by the [place] FDA and has  been authorized for detection and/or diagnosis of SARS-CoV-2 by FDA under an Emergency Use Authorization (EUA). This EUA will remain in effect (meaning this test can be used) for the duration of the COVID-19 declaration under Section 564(b)(1) of the Act, 21 U.S.C. section 360bbb-3(b)(1), unless the authorization is terminated or revoked.     Resp Syncytial Virus by PCR NEGATIVE NEGATIVE Final    Comment: (NOTE) Fact Sheet for Patients: BloggerCourse.com  Fact Sheet for Healthcare Providers: SeriousBroker.it  This test is not yet approved or cleared by the [place] FDA and has been authorized for detection and/or diagnosis of SARS-CoV-2 by FDA under an Emergency Use Authorization (EUA). This EUA will remain in effect (meaning this test can be used) for the duration of the COVID-19 declaration under Section 564(b)(1) of the Act, 21 U.S.C. section 360bbb-3(b)(1), unless the authorization is terminated or revoked.  Performed at [facility], [address] [place], [place] [address]     FURTHER DISCHARGE INSTRUCTIONS:  Get Medicines reviewed and adjusted: Please take all your medications with you for your next visit with your Primary MD  Laboratory/radiological data: Please request your Primary MD to go over all hospital tests and procedure/radiological results at the follow up, please ask your Primary MD to get all Hospital records sent to his/her office.  In some cases, they will be blood work, cultures and biopsy results pending at the time of your discharge. Please request that your primary care M.D. goes through all the records of your hospital data and follows up on these results.  Also Note the following: If you experience worsening of your admission symptoms, develop shortness of breath, life threatening emergency, suicidal or homicidal thoughts you must seek medical attention immediately by calling 911 or  calling your MD immediately  if  symptoms less severe.  You must read complete instructions/literature along with all the possible adverse reactions/side effects for all the Medicines you take and that have been prescribed to you. Take any new Medicines after you have completely understood and accpet all the possible adverse reactions/side effects.   Do not drive when taking Pain medications or sleeping medications (Benzodaizepines)  Do not take more than prescribed Pain, Sleep and Anxiety Medications. It is not advisable to combine anxiety,sleep and pain medications without talking with your primary care practitioner  Special Instructions: If you have smoked or chewed Tobacco  in the last 2 yrs please stop smoking, stop any regular Alcohol  and or any Recreational drug use.  Wear Seat belts while driving.  Please note: You were cared for by a hospitalist during your hospital stay. Once you are discharged, your primary care physician will handle any further medical issues. Please note that NO REFILLS for any discharge medications will be authorized once you are discharged, as it is imperative that you return to your primary care physician (or establish a relationship with a primary care physician if you do not have one) for your post hospital discharge needs so that they can reassess your need for medications and monitor your lab values.  Total Time spent coordinating discharge including counseling, education and face to face time equals greater than 30 minutes.  SignedJeoffrey Massed 07/04/2023 10:25 AM

## 2023-07-04 NOTE — Plan of Care (Signed)

## 2023-07-04 NOTE — Progress Notes (Addendum)
 PHARMACY - ANTICOAGULATION CONSULT NOTE  Pharmacy Consult for IV heparin Indication: pulmonary embolus  Allergies  Allergen Reactions   Ciprofloxacin     Tendons problem in shoulder Other reaction(s): Arthralgias (intolerance), Myalgias (intolerance)   Empagliflozin Other (See Comments)    Dehydration, uti.   Levofloxacin     Other reaction(s): Arthralgias (intolerance), Myalgias (intolerance)   Aricept [Donepezil Hcl] Nausea Only   Bactrim [Sulfamethoxazole-Trimethoprim] Hives   Breztri Aerosphere [Budeson-Glycopyrrol-Formoterol] Other (See Comments)    Patient and wife are unsure what happened, however they remember he had a bad reaction   Levofloxacin Other (See Comments)    Muscle tighten up in left and right calf   Misc. Sulfonamide Containing Compounds Rash    Patient Measurements: Height: 5\' 8"  (172.7 cm) Weight: 91.7 kg (202 lb 2.6 oz) IBW/kg (Calculated) : 68.4 Heparin Dosing Weight: 87.4 kg  Vital Signs: Temp: 98.2 F (36.8 C) (02/26 0732) Temp Source: Oral (02/26 0732) BP: 118/84 (02/26 0732) Pulse Rate: 75 (02/26 0732)  Labs: Recent Labs    07/02/23 1749 07/02/23 2314 07/03/23 0047 07/03/23 0541 07/03/23 0759 07/03/23 1447 07/04/23 0437  HGB 11.9*  --   --  11.2*  --   --  11.2*  HCT 38.7*  --   --  35.6*  --   --  36.6*  PLT 268  --   --  231  --   --  231  HEPARINUNFRC  --   --   --   --  0.59 0.52 0.82*  CREATININE 1.27*  --   --  1.41*  --   --  1.35*  TROPONINIHS  --  21* 16  --   --   --   --     Estimated Creatinine Clearance: 55.2 mL/min (A) (by C-G formula based on SCr of 1.35 mg/dL (H)).   Medical History: Past Medical History:  Diagnosis Date   Allergic rhinitis, seasonal    Asthma    Bronchitis    CHF (congestive heart failure) (HCC)    Chronic kidney disease, stage I    Cognitive impairment    Effects Short Term Memory   collar bone    dislocation left side 05/2014   Degenerative joint disease of knee    bilateral    Diabetes mellitus    Type 2   Dysrhythmia    PVCs   Fatty liver    GERD (gastroesophageal reflux disease)    Headache    Heachaces r/t eyes   Heart murmur    history of   History of acute prostatitis    History of hematuria    History of hiatal hernia    H/O   Hyperlipidemia    Hypertension    IgA nephropathy    OSA on CPAP    Pneumonia    Sinusitis     Assessment: Donald [name] is a 73 y.o. year old male admitted on 07/02/2023 with concern for PE. No anticoagulation prior to admission. CTA with bilateral pulmonary emboli, right greater than left (no R heart strain). Pharmacy consulted to dose heparin.  2/26: Heparin level now supratherapeutic at 0.82 with heparin running at 1500 units/hour. CBC stable, no signs of bleeding or issues with heparin infusion noted.   Goal of Therapy:  Heparin level 0.3-0.7 units/ml Monitor platelets by anticoagulation protocol: Yes   Plan:  Decrease heparin gtt to 1300 units/hr Daily heparin level, CBC, s/s bleeding F/u long term AC plan  ADDENDUM:  Pharmacy consulted to transition  heparin to apixaban.   -STOP heparin infusion - START apixaban 10 mg BID x 7 days, followed by 5 mg BID- administer first dose at the same time heparin infusion is stopped  - Plan communicated to primary RN  - AVS posted, needs education prior to discharge  - on ASA for h/o CAD    Jani Gravel, PharmD Clinical Pharmacist  07/04/2023 7:44 AM

## 2023-07-04 NOTE — Progress Notes (Signed)
   07/04/23 1000  Mobility  Activity Ambulated independently in hallway  Level of Assistance Modified independent, requires aide device or extra time  Assistive Device  (Handrails)  Distance Ambulated (ft) 250 ft  Activity Response Tolerated fair  Mobility Referral Yes  Mobility visit 1 Mobility  Mobility Specialist Start Time (ACUTE ONLY) 1000  Mobility Specialist Stop Time (ACUTE ONLY) 1020  Mobility Specialist Time Calculation (min) (ACUTE ONLY) 20 min   Mobility Specialist: Progress Note  Pt agreeable to mobility session - received in bed. C/o SOB. Returned to bed with all needs met - call bell within reach.    Nurse requested Mobility Specialist to perform oxygen saturation test with pt which includes removing pt from oxygen both at rest and while ambulating.  Below are the results from that testing.     Patient Saturations on Room Air at Rest = spO2 86%  Patient Saturations on 2 Liters of oxygen while Ambulating = sp02 87%   Rested and performed pursed lip breathing for 1 minute with sp02 at 90%.  At end of testing pt left in room on 2 Liters of oxygen.  Reported results to nurse.    Barnie Mort, BS Mobility Specialist Please contact via SecureChat or  Rehab office at [phone].

## 2023-07-04 NOTE — Plan of Care (Signed)

## 2023-07-05 ENCOUNTER — Ambulatory Visit (HOSPITAL_BASED_OUTPATIENT_CLINIC_OR_DEPARTMENT_OTHER): Payer: Self-pay | Admitting: Pulmonary Disease

## 2023-07-05 DIAGNOSIS — G4734 Idiopathic sleep related nonobstructive alveolar hypoventilation: Secondary | ICD-10-CM

## 2023-07-05 NOTE — Telephone Encounter (Signed)
 My response is as above copied again here:  If dropping on 5L at night need new order for oxygen concentrator that can go to 10L and need to increase to flow rate that is necessary.   There is not time for a sleep study we can work on getting this done this is new worsening oxygen need due to PE, DME company needs to provide new concentrator with updated order if needed.

## 2023-07-05 NOTE — Telephone Encounter (Signed)
 Pt's wife calling back for update on plan of care, requesting orders before end of day so can get to DME store before closing if need be. Per pt wife request, called over to [employer] pulm office to request call back to pt wife asap.

## 2023-07-05 NOTE — Telephone Encounter (Signed)
 Called and informed [employer] office of [employer] message and urgent request from wife for pt care and that not wanting to take pt back to hospital, doing well during day, need something for tonight. Front desk verbalized understanding.

## 2023-07-05 NOTE — Telephone Encounter (Signed)
 This message is busy and difficult to understand. If O2 only dropping at night have we tried 5L at night? And if so is it dropping on 5L? If so we need a new order for oxygen concentrator that can go to 10L and need to increase to flow rate that is necessary.   There is not time for a sleep study we can work on getting this done this is new worsening oxygen need due to PE, DME company needs to provide new concentrator with updated order if needed.

## 2023-07-05 NOTE — Addendum Note (Signed)
 Addended by: Christen Butter on: 07/05/2023 04:55 PM   Modules accepted: Orders

## 2023-07-05 NOTE — Telephone Encounter (Signed)
 I spoke with the pt's spouse and notified of response from Dr Judeth Horn  I placed order for 10 lpm concentrator

## 2023-07-05 NOTE — Telephone Encounter (Addendum)
 Call from [employer] office, spoke to [place], office reporting pt experiencing low oxygen levels at 70% pulse ox. Speaking to pt.  Char[facility] Triage "Chief Complaint (e.g., cough, sob, wheezing, fever, chills, sweat or additional symptoms) *Go to specific symptom protocol after initial questions. Oxygen levels goes down to low 80s upper 70s when at rest, had to crank concentrator up to 5 in order for him to get sufficient amount of oxygen, first got it had it to 4 but Saturday turned up to 5 sluggish and SOB, hospital to ED on Monday, blood clots in his lung and left leg, just came home yesterday afternoon, at hospital he was on 3L of O2 while there and was okay, came home last night and he is in 70s at different points of the night sleeping but once into deep sleep oxygen level goes down, pulse ox all night even with concentrator at 5, PCP said call pulm for help, only needs it at 3 during the day, staying in the 90s at 3-3.5L, don't know if need different concentrator, when went to ER told him needed 5, started at 5 then didn't need 5, mostly at 3 most time he was there day and night so don't know if need different concentrator more like hospital's, need this done asap please for tonight, don't want to have to take him to hospital 10 hrs in waiting room even after ambulance, hoping it's the concentrator or if there's a med. Now taking eliquis for clots, was on heparin around 36 hours, not currently struggling during day, not a struggle it's more of a his oxygen levels drop down, when he's at rest his oxygen levels go low, when up moving around they are higher, up and talking, no confusion or wheezing, just not getting ample amount of oxygen into his system when sleeping even with CPAP with nap or bedtime "How long have symptoms been present?" Been happening for a long time, years but just recently, O&O needs oxygen  Need something for tonight [employer] [facility] may need to be called, can't do without  some sort of permission here, need something done today  OXYGEN: "Do you wear supplemental oxygen?" Yes If yes, "How many liters are you supposed to use?" Normally at 3-3.5L "Do you monitor your oxygen levels?" Yes If yes, "What is your reading (oxygen level) today?" 90s during the day, but going down way lower at night than usual to low 80s, upper 70s when sleeping   Chief Complaint: post-admission lower oxygen levels at night Symptoms: no trouble during day, not symptomatic of SOB at night but pulse ox worsening when sleeping upper 70s-low 80s% Frequency: intermittent, continual Pertinent Negatives: Patient wife denies current trouble breathing Disposition: [] 911 / [] ED /[] Urgent Care (no appt availability in office) / [] Appointment(In office/virtual)/ []  [facility]/ [] Home Care/ [] Refused Recommended Disposition /[] [facility]/ [x]  Follow-up with PCP Additional Notes: Pt wife reporting pt's oxygen levels been going "down to low 80s upper 70s" when sleeping only. Pt wife reporting recent hospital admission due to dropping O2 sats at night, reportingh that when "first got it [concentrator] had it to 4L but Saturday turned up to 5L" because pt was "sluggish" and SOB, took him to "hospital to ED on Monday, found blood clots in his lungs and left leg, just came home yesterday afternoon." Pt wife reporting that hospital started pt at 5L then titrated down from there, reporting that at hospital he was "at 3L most of the time he  was there," during "day and night" even while sleeping. Pt wife reporting now that pt "came home last night and he is in 70s at different points of the night sleeping," confirms "once into deep sleep, oxygen level goes down," pt has pulse ox all night to monitor and "even with concentrator at 5L" at night during sleep, pt having is these lower O2 sats. Pt wife reporting that PCP said call pulm for help. Pt wife confirms currently "only needs it at 3L  during the day, staying in the 90s% at 3-3.5L" during the day but night continues to be a worsened issue. Pt wife confirms not currently struggling during day, pt been "up and talking" during day, O2 sats are higher with activity per wife, no confusion or wheezing, "just not getting ample amount of oxygen into his system when sleeping" even with CPAP and oxygen concentrator with nap and bedtime. Wife reporting "don't know if need different concentrator," like one more like the hospital's with better pressure or otherwise, or if need medication, but "need this done asap please for tonight, don't want to have to take him to hospital," pt waited for "10 hrs in waiting room even after ambulance." Pt wife reporting pt "now taking eliquis for clots, was on heparin around 36 hours" at hospital." Nurse offered to try for appt, no availability today with pulm office but could try for tomorrow and send [employer] message in meantime for plan of care in the immediate. Wife emphasizing "need something for tonight," reporting "[employer] may need to be called, can't do that without some sort of permission here, need something done today." Advised call or hospital if worsening, advised will send [employer] message with request, should receive call back from office with plan of care in next few hours, advised wife to call back if no call back received from office in next few hours. Wife verbalized understanding. Attempted to call [employer] office to inform of message, no answer.  Reason for Disposition  Oxygen level (e.g., pulse oximetry) 90 percent or lower  Protocols used: Breathing Difficulty-A-AH

## 2023-07-05 NOTE — Telephone Encounter (Addendum)
 I apologize for the misunderstanding with the message, I will edit it for clarification. The pt's wife stated that his O2 sats are going down even at 5L now at night, when in the hospital he had been doing well at 3L at night. Wife is wondering if there's too much of a difference in the functionality of the concentrator they have at home versus the one in the hospital or if there is something else that must be done.  The narrative portion of the original note has now been edited for clarity. I hope this is helpful.

## 2023-07-05 NOTE — Telephone Encounter (Signed)
 Dr. Judeth Horn, please advise and send back to triage.  Thank you.

## 2023-07-06 ENCOUNTER — Telehealth: Payer: Self-pay | Admitting: Pulmonary Disease

## 2023-07-06 NOTE — Telephone Encounter (Addendum)
 Called patient.  Patient states she came by our clinic today to get update on oxygen concentrator.  Patient has also called [employer].  Patient states [employer] told her she should get the oxygen concentrator by this afternoon if everything goes okay, but may need a prior authorization.  Informed patient I would call [employer] regarding this needing a prior authorization.  Spoke with customer service person at [employer] in [place], [place].  She states it looks like this DOES need a prior authorization but she was not sure.  Patient asking that we call [employer] to get this corrected as patient NEEDS THE 10 L OXYGEN CONCENTRATOR UNIT TO HIS HOME TODAY.  [facility] team, please advise, thank you!

## 2023-07-06 NOTE — Telephone Encounter (Signed)
 THIS IS FOR A PE AND HYPOXEMIA INDEPENDENT OF PRIOR OSA. THE OXYGEN NEEDS TO BE DELIVERED, NEW ORDER SENT 2/27.

## 2023-07-06 NOTE — Telephone Encounter (Signed)
 Message from [employer]:   [name], Pietro Cassis; [name] We are still waiting on a sleep study to qualify the patient. This pt has OSA and so insurance would not take the ONO as testing. I will reach out to the patient and see what they would like to do.

## 2023-07-06 NOTE — Telephone Encounter (Signed)
 Please refer to telephone encounter dated 07/06/23. Pt has received his concentrator.

## 2023-07-06 NOTE — Telephone Encounter (Signed)
 Called patient and spoke with his wife. She states that the 10 liter O2 tank has been delivered and they are ok for the weekend.    We need to make sure the order sent matches the office note so the patient will not have to pay for this our of pocket.   Once the order has been fixed we need to make sure the patient is aware please.

## 2023-07-09 NOTE — Telephone Encounter (Signed)
 Patricia's number is [phone]

## 2023-07-09 NOTE — Telephone Encounter (Signed)
 Spoke to [place] with [employer].  Elease Hashimoto stated that patient's spouse reached out to health team and was advised that insurance is paying for oxygen.  She stated that [employer]'s billing team is currently working on this and she will call back with an update.

## 2023-07-09 NOTE — Telephone Encounter (Signed)
 Chan--do you have a number for Elease Hashimoto?

## 2023-07-10 ENCOUNTER — Ambulatory Visit: Payer: PPO | Admitting: Pulmonary Disease

## 2023-07-10 ENCOUNTER — Telehealth: Payer: Self-pay | Admitting: Pulmonary Disease

## 2023-07-10 ENCOUNTER — Encounter: Payer: Self-pay | Admitting: Pulmonary Disease

## 2023-07-10 VITALS — BP 114/73 | HR 109 | Ht 68.0 in | Wt 206.0 lb

## 2023-07-10 DIAGNOSIS — J9621 Acute and chronic respiratory failure with hypoxia: Secondary | ICD-10-CM

## 2023-07-10 DIAGNOSIS — R0609 Other forms of dyspnea: Secondary | ICD-10-CM

## 2023-07-10 DIAGNOSIS — Z7722 Contact with and (suspected) exposure to environmental tobacco smoke (acute) (chronic): Secondary | ICD-10-CM

## 2023-07-10 DIAGNOSIS — G4734 Idiopathic sleep related nonobstructive alveolar hypoventilation: Secondary | ICD-10-CM

## 2023-07-10 NOTE — Progress Notes (Signed)
 @Patient  ID: Donald [name], male    DOB: 1950-10-29, 73 y.o.   MRN: [id number]  Chief Complaint  Patient presents with   Follow-up    ED f/u 2/24 for trouble breathing, sent home to wear 02 all day. Pt feels exhausted, confused, no energy, & sob during exertion. Chest discomfort. (Ab 2wks)    Referring provider: Chilton Greathouse, MD  HPI:   73 y.o. man with dyspnea on exertion with clear chest imaging and whom we are seeing for hospital follow-up after worsening hypoxemia in the setting of pulmonary embolism.  Multiple hospital notes reviewed.  Multiple telephone encounters from our clinic reviewed.  Patient with dyspnea he says came out of the blue 03/2023.  Evaluated 1 month later by me at that time.  PFTs normal.  Notably he has chronic reduced EF.  This is likely the etiology of his symptomatology at that time.  Heart failure.  He had worsening hypoxemia couple weeks ago.  Call the clinic.  Went to the ED.  Diagnosed with PE.  Discharged home on oxygen.  Needing more oxygen at night etc.  Notes when he gets up and moves around oxygen improved.  We discussed shunt physiology and inactivity.  Discussed oxygen therapy.  He is interested in POC.  Qualifying walk performed today.  HPI initial visit: Long issue with chronic cough.  Tried multiple things.  Was seen a couple weeks ago video visit chiefly with shortness of breath.  They state cough is improved.  Despite this.  Recommendations made for trying gabapentin.  Again cough improved prior to this.  Took it for few days and that made him feel lightheaded woozy not quite himself.  Similar issues in the past.  This was discontinued.  Which I agree with.  He continues with severe shortness of breath.  Today after virtual visit went to ED.  Chest x-ray on my review interpretation is clear.  CTA PE protocol my review interpretation is clear with no evidence of PE.  He is tried Atrovent prescribed by Buelah Manis, NP at last visit.  No  improvement.  Through the course of this got a round of azithromycin as well as prednisone.  No improvement with this.  Seen by his cardiology team, NP in the interim.  Placed on Lasix for a few days.  His weight is down about 5 pounds by scales here.  He says at home his weight is down a little bit more.  Regardless, no real improvement in the symptoms despite taking Lasix.  Sounds like they were concerned about fluid accumulation in his abdomen.  We discussed at length with normal chest imaging and prior normal PFTs that lung is likely not the culprit here.  In addition, if lungs were the culprit asthma will be the most likely thing given normal PFTs and normal chest imaging.  However he is not improved with inhalers in the past, short acting bronchodilators with current symptoms, nor with prednisone or antibiotics.  Discussed further evaluation with PFTs although in the past these been reassuring.  Discussed messaging cardiology team for further evaluation and assistance.  Discussed trying an inhaler just in case.  Questionaires / Pulmonary Flowsheets:   ACT:      No data to display          MMRC:     No data to display          Epworth:      No data to display  Tests:   FENO:  No results found for: "NITRICOXIDE"  PFT:    Latest Ref Rng & Units 04/20/2023   11:26 AM 02/11/2021   11:56 AM 06/17/2014    2:21 PM  PFT Results  FVC-Pre L 3.05  P 2.81  3.30   FVC-Predicted Pre % 80  P 67  75   FVC-Post L 3.19  P 3.04  3.49   FVC-Predicted Post % 83  P 73  79   Pre FEV1/FVC % % 74  P 73  73   Post FEV1/FCV % % 76  P 76  77   FEV1-Pre L 2.25  P 2.05  2.42   FEV1-Predicted Pre % 80  P 66  73   FEV1-Post L 2.43  P 2.32  2.69   DLCO uncorrected ml/min/mmHg 18.34  P 19.80  21.18   DLCO UNC% % 79  P 80  71   DLCO corrected ml/min/mmHg 20.63  P 19.80    DLCO COR %Predicted % 89  P 80    DLVA Predicted % 102  P 100  94   TLC L 5.81  P 4.54  5.18   TLC % Predicted  % 92  P 68  78   RV % Predicted % 112  P 78  83     P Preliminary result  Personally viewed interpreted as normal spirometry, no significant bronchodilator response, lung volumes indicative of mild air trapping, DLCO within normal limits.  WALK:     07/10/2023   10:00 AM 03/30/2021    1:12 PM 06/11/2014    5:29 PM 08/18/2011    4:37 PM  SIX MIN WALK  Supplimental Oxygen during Test? (L/min) Yes No No No  O2 Flow Rate 4 L/min     Type Pulse     Tech Comments: Pt qualified for 4L POC & 2L continuous walked at a steady pace with no breaks  test completed/pt's o2 sats drop but he quickly recovers//mmr    Imaging: Personally reviewed and as per EMR and discussion in this note VAS [place] LOWER EXTREMITY VENOUS (DVT) Result Date: 07/03/2023  Lower Venous DVT Study Patient Name:  Donald [name]  Date of Exam:   07/03/2023 Medical Rec #: [id number]          Accession #:    [id number] Date of Birth: 05-05-1951         Patient Gender: M Patient Age:   73 years Exam Location:  [facility] Procedure:      VAS [place] LOWER EXTREMITY VENOUS (DVT) Referring Phys: TIMOTHY OPYD --------------------------------------------------------------------------------  Indications: Pain, and pulmonary embolism.  Risk Factors: Confirmed PE. Anticoagulation: Heparin. Performing Technologist: [place] Sink Sturdivant-Jones RDMS, RVT  Examination Guidelines: A complete evaluation includes B-mode imaging, spectral Doppler, color Doppler, and power Doppler as needed of all accessible portions of each vessel. Bilateral testing is considered an integral part of a complete examination. Limited examinations for reoccurring indications may be performed as noted. The reflux portion of the exam is performed with the patient in reverse Trendelenburg.  +---------+---------------+---------+-----------+----------+--------------+ RIGHT    CompressibilityPhasicitySpontaneityPropertiesThrombus Aging  +---------+---------------+---------+-----------+----------+--------------+ CFV      Full           Yes      Yes                                 +---------+---------------+---------+-----------+----------+--------------+ SFJ      Full                                                        +---------+---------------+---------+-----------+----------+--------------+  FV Prox  Full                                                        +---------+---------------+---------+-----------+----------+--------------+ FV Mid   Full                                                        +---------+---------------+---------+-----------+----------+--------------+ FV DistalFull                                                        +---------+---------------+---------+-----------+----------+--------------+ PFV      Full                                                        +---------+---------------+---------+-----------+----------+--------------+ POP      Full           Yes      Yes                                 +---------+---------------+---------+-----------+----------+--------------+ PTV      Full                                                        +---------+---------------+---------+-----------+----------+--------------+ PERO     Full                                                        +---------+---------------+---------+-----------+----------+--------------+   +---------+---------------+---------+-----------+----------+--------------+ LEFT     CompressibilityPhasicitySpontaneityPropertiesThrombus Aging +---------+---------------+---------+-----------+----------+--------------+ CFV      Full           Yes      Yes                                 +---------+---------------+---------+-----------+----------+--------------+ SFJ      Full                                                         +---------+---------------+---------+-----------+----------+--------------+ FV Prox  Full                                                        +---------+---------------+---------+-----------+----------+--------------+  FV Mid   Full                                                        +---------+---------------+---------+-----------+----------+--------------+ FV DistalFull                                                        +---------+---------------+---------+-----------+----------+--------------+ PFV      Full                                                        +---------+---------------+---------+-----------+----------+--------------+ POP      Full           Yes      Yes                                 +---------+---------------+---------+-----------+----------+--------------+ PTV      Partial                                      Acute          +---------+---------------+---------+-----------+----------+--------------+ PERO     None                                         Acute          +---------+---------------+---------+-----------+----------+--------------+     Summary: RIGHT: - There is no evidence of deep vein thrombosis in the lower extremity.  - No cystic structure found in the popliteal fossa. 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  - No cystic structure found in the popliteal fossa. 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  *See table(s) above for measurements and observations. Electronically signed by Lemar Livings MD on 07/03/2023 at 3:27:52 PM.    Final    CT Angio Chest PE W and/or Wo Contrast Result Date: 07/03/2023 CLINICAL DATA:  High probability for PE. EXAM: CT ANGIOGRAPHY CHEST WITH CONTRAST TECHNIQUE: Multidetector CT imaging  of the chest was performed using the standard protocol during bolus administration of intravenous contrast. Multiplanar CT image reconstructions and MIPs were obtained to evaluate the vascular anatomy. RADIATION DOSE REDUCTION: This exam was performed according to the departmental dose-optimization program which includes automated exposure control, adjustment of the mA and/or kV according to patient size and/or use of iterative reconstruction technique. CONTRAST:  75mL OMNIPAQUE IOHEXOL 350 MG/ML SOLN COMPARISON:  CT angiogram lung 03/31/2023 FINDINGS: Cardiovascular: There are pulmonary emboli in the distal right main pulmonary artery extending into lobar, segmental and subsegmental branches of the right upper lobe, right middle lobe and right lower lobe. There also  segmental pulmonary emboli within the left upper lobe. Heart is enlarged. There is no pericardial effusion. Aorta is normal in size. There are atherosclerotic calcifications of the aorta. Mediastinum/Nodes: No enlarged mediastinal, hilar, or axillary lymph nodes. Thyroid gland, trachea, and esophagus demonstrate no significant findings. Lungs/Pleura: Lungs are clear. No pleural effusion or pneumothorax. Upper Abdomen: Hypodensity in the left lobe of the liver measures 1 cm, indeterminate. No acute abnormality. Musculoskeletal: Degenerative changes affect the spine. Lower cervical spinal fusion plate is present. Review of the MIP images confirms the above findings. IMPRESSION: 1. Bilateral pulmonary emboli, right greater than left. No CT evidence for right heart strain. 2. Cardiomegaly. 3. Aortic atherosclerosis. 4. Hypodensity in the left lobe of the liver measures 1 cm, indeterminate, possibly cyst or hemangioma. Aortic Atherosclerosis (ICD10-I70.0). These results were called by telephone at the time of interpretation on 07/03/2023 at 12:55 am to provider [facility] , who verbally acknowledged these results. Electronically Signed   By: Darliss Cheney  M.D.   On: 07/03/2023 00:55   DG Chest 1 View Result Date: 07/02/2023 CLINICAL DATA:  Dyspnea EXAM: CHEST  1 VIEW COMPARISON:  03/31/2023 FINDINGS: Lungs are clear.  No pleural effusion or pneumothorax. The heart is normal in size. Cervical spine fixation hardware. IMPRESSION: No acute cardiopulmonary disease. Electronically Signed   By: Charline Bills M.D.   On: 07/02/2023 19:53   LONG TERM MONITOR (3-14 DAYS) Result Date: 06/15/2023   The dominant rhythm is normal sinus rhythm or mild sinus tachycardia, with normal circadian variation.   There are occasional premature supraventricular beats (approximately 4% of all beats), but there is no significant complex atrial arrhthmia. Atrial fibrillation did not occur.   There are rare premature ventricular contractions and a single very short burst of nonsustained VT (4 beats).   There is no evidence of severe bradycardia, pauses or high grade AV block. There is normal sinus rhythm, but with relatively high rates (average heart of 99 bpm). Occasional premature atrial contractions are seen, otherwise normal arrhythmia monitor. Patient symptoms correlate with mild sinus tachycardia, often with superimposed PACs. Patch Wear Time:  7 days and 3 hours (2025-01-25T20:21:06-498 to 2025-02-01T23:27:24-0500) Patient had a min HR of 64 bpm, max HR of 200 bpm, and avg HR of 99 bpm. Predominant underlying rhythm was Sinus Rhythm. 1 run of Ventricular Tachycardia occurred lasting 4 beats with a max rate of 188 bpm (avg 179 bpm). 2 Supraventricular Tachycardia runs occurred, the run with the fastest interval lasting 4 beats with a max rate of 200 bpm, the longest lasting 6 beats with an avg rate of 166 bpm. Isolated SVEs were occasional (3.9%, 16109), SVE Couplets were rare (<1.0%, 856), and SVE Triplets were rare (<1.0%, 123). Isolated VEs were rare (<1.0%), VE Couplets were rare (<1.0%), and no VE Triplets were present.    Lab Results: Personally reviewed, no significant  anemia recently CBC    Component Value Date/Time   WBC 9.4 07/04/2023 0437   RBC 4.70 07/04/2023 0437   HGB 11.2 (L) 07/04/2023 0437   HGB 12.2 (L) 06/11/2023 1442   HCT 36.6 (L) 07/04/2023 0437   HCT 39.4 06/11/2023 1442   PLT 231 07/04/2023 0437   PLT 276 06/11/2023 1442   MCV 77.9 (L) 07/04/2023 0437   MCV 76 (L) 06/11/2023 1442   MCH 23.8 (L) 07/04/2023 0437   MCHC 30.6 07/04/2023 0437   RDW 19.2 (H) 07/04/2023 0437   RDW 18.0 (H) 06/11/2023 1442   LYMPHSABS 2.7 07/02/2023 1749  LYMPHSABS 1.9 10/18/2020 1033   MONOABS 0.8 07/02/2023 1749   EOSABS 0.5 07/02/2023 1749   EOSABS 0.4 10/18/2020 1033   BASOSABS 0.1 07/02/2023 1749   BASOSABS 0.1 10/18/2020 1033    BMET    Component Value Date/Time   NA 137 07/04/2023 0437   NA 141 06/11/2023 1442   K 4.0 07/04/2023 0437   CL 108 07/04/2023 0437   CO2 24 07/04/2023 0437   GLUCOSE 249 (H) 07/04/2023 0437   BUN 21 07/04/2023 0437   BUN 58 (H) 06/11/2023 1442   CREATININE 1.35 (H) 07/04/2023 0437   CALCIUM 8.4 (L) 07/04/2023 0437   GFRNONAA 56 (L) 07/04/2023 0437   GFRAA >60 12/27/2018 0653    BNP    Component Value Date/Time   BNP 40.3 07/02/2023 1749    ProBNP    Component Value Date/Time   PROBNP 46 12/06/2022 1436   PROBNP 7.0 03/30/2021 1147    Specialty Problems       Pulmonary Problems   Seasonal allergic rhinitis due to pollen   SOB (shortness of breath)   CXR 07/2011:  Clear.  Echo 07/2011:  Mild diffuse HK with EF 50-55%.  Otherwise ok.  PFT"s 08/2011:  Totally normal.       Nocturnal hypoxemia   ONO 08/2011:  Low sat 82%, 8 hrs less than or equal to 88%. Ambulatory ox 08/2011:  desat 86-88%, but recovered very quickly      OSA (obstructive sleep apnea)   NPSG 10/2011:  AHI 15/hr with desat to 80%.  Spent 194 min less than 88% during the night.       Pneumonia, interstitial (HCC)   Mild intermittent asthma without complication   Hypoxemia requiring supplemental oxygen   OSA on CPAP    Pneumonitis   Chronic cough   Chronic sinusitis with recurrent bronchitis   Dyspnea    Allergies  Allergen Reactions   Ciprofloxacin     Tendons problem in shoulder Other reaction(s): Arthralgias (intolerance), Myalgias (intolerance)   Empagliflozin Other (See Comments)    Dehydration, uti.   Levofloxacin     Other reaction(s): Arthralgias (intolerance), Myalgias (intolerance)   Aricept [Donepezil Hcl] Nausea Only   Bactrim [Sulfamethoxazole-Trimethoprim] Hives   Breztri Aerosphere [Budeson-Glycopyrrol-Formoterol] Other (See Comments)    Patient and wife are unsure what happened, however they remember he had a bad reaction   Levofloxacin Other (See Comments)    Muscle tighten up in left and right calf   Misc. Sulfonamide Containing Compounds Rash    Immunization History  Administered Date(s) Administered   Influenza Split 02/05/2014   Influenza, Quadrivalent, Recombinant, Inj, Pf 01/16/2018, 03/06/2019, 02/25/2020, 02/23/2021   Influenza-Unspecified 03/09/2009, 02/08/2010, 02/22/2011, 01/15/2012, 03/05/2013, 01/15/2014, 02/05/2014, 03/23/2015, 03/14/2016, 03/16/2017   Pneumococcal Conjugate-13 04/06/2014, 08/29/2017   Pneumococcal Polysaccharide-23 05/08/2006   Pneumococcal-Unspecified 03/08/2014   Tdap 07/10/2008   Zoster, Live 01/30/2014    Past Medical History:  Diagnosis Date   Allergic rhinitis, seasonal    Asthma    Bronchitis    CHF (congestive heart failure) (HCC)    Chronic kidney disease, stage I    Cognitive impairment    Effects Short Term Memory   collar bone    dislocation left side 05/2014   Degenerative joint disease of knee    bilateral   Diabetes mellitus    Type 2   Dysrhythmia    PVCs   Fatty liver    GERD (gastroesophageal reflux disease)    Headache    Heachaces r/t  eyes   Heart murmur    history of   History of acute prostatitis    History of hematuria    History of hiatal hernia    H/O   Hyperlipidemia    Hypertension    IgA  nephropathy    OSA on CPAP    Pneumonia    Sinusitis     Tobacco History: Social History   Tobacco Use  Smoking Status Never  Smokeless Tobacco Never  Tobacco Comments   Second hand smoke for 26 years per pt   Counseling given: Not Answered Tobacco comments: Second hand smoke for 26 years per pt   Continue to not smoke  Outpatient Encounter Medications as of 07/10/2023  Medication Sig   apixaban (ELIQUIS) 5 MG TABS tablet Take 2 tablets (10mg ) by mouth twice daily for 7 days, then switch to 1 tablet (5mg ) by mouth twice daily.   Continuous Blood Gluc Sensor (FREESTYLE LIBRE 14 DAY SENSOR) MISC Inject 1 Application into the skin every 14 (fourteen) days.   DULoxetine (CYMBALTA) 30 MG capsule Take 30 mg by mouth in the morning.   finasteride (PROSCAR) 5 MG tablet Take 5 mg by mouth in the morning.   fluticasone (FLONASE) 50 MCG/ACT nasal spray Place 1 spray into both nostrils daily.   furosemide (LASIX) 20 MG tablet Take 10 mg by mouth daily.   HYDROcodone-acetaminophen (NORCO/VICODIN) 5-325 MG tablet Take 1-2 tablets by mouth every 4 (four) hours as needed for moderate pain ((score 4 to 6)). (Patient taking differently: Take 1 tablet by mouth every 6 (six) hours as needed for moderate pain (pain score 4-6) ((score 4 to 6)).)   insulin lispro (HUMALOG KWIKPEN) 200 UNIT/ML KwikPen Inject 30-130 Units into the skin in the morning, at noon, and at bedtime. Sliding scale If blood sugar is over 140   loratadine (CLARITIN) 10 MG tablet Take 10 mg by mouth at bedtime.   memantine (NAMENDA) 10 MG tablet Take 1 tablet (10 mg total) by mouth 2 (two) times daily.   metFORMIN (GLUCOPHAGE) 1000 MG tablet Take 1 tablet (1,000 mg total) by mouth 2 (two) times daily with a meal. Restart on 8/22   metoprolol succinate (TOPROL-XL) 50 MG 24 hr tablet Take 50 mg by mouth 2 (two) times daily. Take with or immediately following a meal.   pantoprazole (PROTONIX) 40 MG tablet Take 1 tablet (40 mg total) by  mouth 2 (two) times daily.   rosuvastatin (CRESTOR) 20 MG tablet TAKE ONE TABLET BY MOUTH ONE TIME DAILY   sacubitril-valsartan (ENTRESTO) 97-103 MG Take 1 tablet by mouth 2 (two) times daily.   silodosin (RAPAFLO) 8 MG CAPS capsule Take 8 mg by mouth every evening.   spironolactone (ALDACTONE) 25 MG tablet Take one-half tablet by mouth daily   TRESIBA FLEXTOUCH 200 UNIT/ML FlexTouch Pen Inject 100 Units into the skin in the morning.   ipratropium (ATROVENT HFA) 17 MCG/ACT inhaler Inhale 2 puffs into the lungs every 6 (six) hours. (Patient not taking: Reported on 07/03/2023)   REPATHA 140 MG/ML SOSY Inject 1 each into the skin daily. (Patient not taking: Reported on 07/03/2023)   No facility-administered encounter medications on file as of 07/10/2023.     Review of Systems  Review of Systems  N/a Physical Exam  BP 114/73 (BP Location: Left Arm, Patient Position: Sitting, Cuff Size: Large)   Pulse (!) 109   Ht 5\' 8"  (1.727 m)   Wt 206 lb (93.4 kg)   SpO2 95%  BMI 31.32 kg/m   Wt Readings from Last 5 Encounters:  07/10/23 206 lb (93.4 kg)  07/03/23 202 lb 2.6 oz (91.7 kg)  06/11/23 202 lb 3.2 oz (91.7 kg)  05/28/23 207 lb (93.9 kg)  04/30/23 201 lb 3.2 oz (91.3 kg)    BMI Readings from Last 5 Encounters:  07/10/23 31.32 kg/m  07/03/23 30.74 kg/m  06/11/23 30.74 kg/m  05/28/23 31.47 kg/m  04/30/23 30.59 kg/m     Physical Exam General: Sitting in chair, no acute distress Eyes: EOMI, no icterus Neck: Supple, no JVP Cardiovascular: Warm, no edema noted Pulmonary: Clear, normal work of breathing Abdomen: Nondistended, bowel sounds present MSK: No synovitis, no joint effusion Neuro: Normal gait, no weakness Psych: Normal mood, full affect   Assessment & Plan:   Dyspnea on exertion: Relatively sudden onset.  Initial chest imaging including PE protocol CTA clear and no evidence of PE.  No improvement with asthma therapies such as prednisone and inhalers.  No  improvement with antibiotics.  Prior PFTs in the past reassuring.  Low suspicion for pulmonary driver.  No improvement with maximal inhaled therapy, ICS/LABA LAMA.  Repeat PFTs within normal limits.  Suspect cardiac drivers of chronic baseline dyspnea on exertion as well as element of deconditioning.  Acute on chronic hypoxemic respiratory failure: Due to pulmonary embolism.  New in the interim since initial workup for dyspnea on exertion.  Sudden onset of worsening dyspnea as well as hypoxemia.  Diagnosed with PE on CTA PE protocol 06/2023.  Continue Eliquis.  Hopefully we able to wean oxygen in the future.  Oxygen numbers improved with ambulation, this likely ended up.  Physiology related to atelectasis and activity.  Qualify for POC today.  Patient Saturations on Room Air at Rest = 98% Patient Saturations on [employer] while Ambulating = 87% Patient Saturations on 4 Liters of continuous with POC oxygen while Ambulating = 94%  Return in about 3 months (around 10/10/2023) for f/u Dr. Judeth Horn.   Karren Burly, MD 07/10/2023   This appointment required 41 minutes of patient care (this includes precharting, chart review, review of results, face-to-face care, etc.).

## 2023-07-10 NOTE — Patient Instructions (Signed)
 Nice to see you again  Will walk around the clinic today to see if you qualify for a oxygen concentrator, if not, how much oxygen you need to be at continuous flow.  Continue Eliquis as you are, we will continue the blood thinner for now, we can discuss how long in the future  Slowly increase your activity level, build back up your stamina  You can get an incentive spirometer from [facility], I would use this throughout the day while you are sitting around to help take deeper breaths and help the oxygen numbers while you are inactive.  Return to clinic in 3 months or sooner as needed with Dr. Judeth Horn

## 2023-07-10 NOTE — Telephone Encounter (Signed)
 Donald [name]; [place], Tomie China; Kathe Becton; Randie Heinz, Tracy This pt is currently on nocturnal pt. The order will need to state continuous with POC and all three sats need to be copied in the providers progress note please.

## 2023-07-10 NOTE — Telephone Encounter (Signed)
Note has been finalized.

## 2023-07-11 DIAGNOSIS — I130 Hypertensive heart and chronic kidney disease with heart failure and stage 1 through stage 4 chronic kidney disease, or unspecified chronic kidney disease: Secondary | ICD-10-CM | POA: Diagnosis not present

## 2023-07-11 DIAGNOSIS — Z794 Long term (current) use of insulin: Secondary | ICD-10-CM | POA: Diagnosis not present

## 2023-07-11 DIAGNOSIS — R6889 Other general symptoms and signs: Secondary | ICD-10-CM | POA: Diagnosis not present

## 2023-07-11 DIAGNOSIS — R413 Other amnesia: Secondary | ICD-10-CM | POA: Diagnosis not present

## 2023-07-11 DIAGNOSIS — J45909 Unspecified asthma, uncomplicated: Secondary | ICD-10-CM | POA: Diagnosis not present

## 2023-07-11 DIAGNOSIS — I724 Aneurysm of artery of lower extremity: Secondary | ICD-10-CM | POA: Diagnosis not present

## 2023-07-11 DIAGNOSIS — I5042 Chronic combined systolic (congestive) and diastolic (congestive) heart failure: Secondary | ICD-10-CM | POA: Diagnosis not present

## 2023-07-11 DIAGNOSIS — E1122 Type 2 diabetes mellitus with diabetic chronic kidney disease: Secondary | ICD-10-CM | POA: Diagnosis not present

## 2023-07-11 DIAGNOSIS — R0902 Hypoxemia: Secondary | ICD-10-CM | POA: Diagnosis not present

## 2023-07-11 DIAGNOSIS — J9601 Acute respiratory failure with hypoxia: Secondary | ICD-10-CM | POA: Diagnosis not present

## 2023-07-11 DIAGNOSIS — I82452 Acute embolism and thrombosis of left peroneal vein: Secondary | ICD-10-CM | POA: Diagnosis not present

## 2023-07-11 DIAGNOSIS — N182 Chronic kidney disease, stage 2 (mild): Secondary | ICD-10-CM | POA: Diagnosis not present

## 2023-07-11 DIAGNOSIS — I2699 Other pulmonary embolism without acute cor pulmonale: Secondary | ICD-10-CM | POA: Diagnosis not present

## 2023-07-11 DIAGNOSIS — E118 Type 2 diabetes mellitus with unspecified complications: Secondary | ICD-10-CM | POA: Diagnosis not present

## 2023-07-11 NOTE — Telephone Encounter (Signed)
 Donald [name] please make sure [employer] has everything they need for this order.  Thanks!

## 2023-07-12 NOTE — Telephone Encounter (Signed)
 Sent to [employer] that note had been finalized awaiting response

## 2023-07-12 NOTE — Telephone Encounter (Signed)
 Donald [name]; [place], [place]; [name], [name]; [name] The order Frequency has not been modified to "Continuous with POC" and signed. Once that is updated everything else looks great. -Sorry this just needs to be added and then the order will be complete

## 2023-07-12 NOTE — Telephone Encounter (Signed)
 [employer] has received note. NFN

## 2023-07-12 NOTE — Telephone Encounter (Signed)
 Morrie Sheldon from [facility] states patient needs humidifier for his oxygen. Morrie Sheldon phone number is [phone].

## 2023-07-12 NOTE — Telephone Encounter (Signed)
 If someone could please update order as above that would be great thanks

## 2023-07-13 NOTE — Addendum Note (Signed)
 Addended by: Gay Filler T on: 07/13/2023 09:22 AM   Modules accepted: Orders

## 2023-07-13 NOTE — Telephone Encounter (Signed)
 Order sent. NFN

## 2023-07-14 DIAGNOSIS — I509 Heart failure, unspecified: Secondary | ICD-10-CM | POA: Diagnosis not present

## 2023-07-14 DIAGNOSIS — R0609 Other forms of dyspnea: Secondary | ICD-10-CM | POA: Diagnosis not present

## 2023-07-16 NOTE — Telephone Encounter (Signed)
 Spoke to Donald [name] with [employer] and confirmed that everything has been handled. Patient will not be charged out of pocket. Nothing further needed.

## 2023-07-18 NOTE — Progress Notes (Unsigned)
 Patient ID: Donald [name], male   DOB: [birth year], 73 y.o.   MRN: [id number]  Reason for Consult: No chief complaint on file.   Referred by Chilton Greathouse, MD  Subjective:     HPI  Donald [name] is a 73 y.o. 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  This was identified on DVT study during recent hospitalization for shortness of breath and intermittent cramping in discomfort in her lower extremities.  He was negative for DVT. ***  Past Medical History:  Diagnosis Date  . Allergic rhinitis, seasonal   . Asthma   . Bronchitis   . CHF (congestive heart failure) (HCC)   . Chronic kidney disease, stage I   . Cognitive impairment    Effects Short Term Memory  . collar bone    dislocation left side 05/2014  . Degenerative joint disease of knee    bilateral  . Diabetes mellitus    Type 2  . Dysrhythmia    PVCs  . Fatty liver   . GERD (gastroesophageal reflux disease)   . Headache    Heachaces r/t eyes  . Heart murmur    history of  . History of acute prostatitis   . History of hematuria   . History of hiatal hernia    H/O  . Hyperlipidemia   . Hypertension   . IgA nephropathy   . OSA on CPAP   . Pneumonia   . Sinusitis    Family History  Problem Relation Age of Onset  . Coronary artery disease Father   . Diabetes type II Father   . Heart attack Father    Past Surgical History:  Procedure Laterality Date  . BIOPSY  05/17/2020   Procedure: BIOPSY;  Surgeon: Vida Rigger, MD;  Location: [facility];  Service: Endoscopy;;  . CARDIAC CATHETERIZATION  09/29/2011   normal  . CATARACT EXTRACTION Bilateral   . CERVICAL SPINE SURGERY    . COLONOSCOPY WITH PROPOFOL N/A 05/17/2020   Procedure: COLONOSCOPY WITH PROPOFOL;  Surgeon: Vida Rigger, MD;  Location: [facility];  Service: Endoscopy;  Laterality: N/A;  . deviated septum     1990s  . ESOPHAGOGASTRODUODENOSCOPY (EGD) WITH PROPOFOL N/A 05/17/2020    Procedure: ESOPHAGOGASTRODUODENOSCOPY (EGD) WITH PROPOFOL;  Surgeon: Vida Rigger, MD;  Location: [facility];  Service: Endoscopy;  Laterality: N/A;  . HEMOSTASIS CLIP PLACEMENT  05/17/2020   Procedure: HEMOSTASIS CLIP PLACEMENT;  Surgeon: Vida Rigger, MD;  Location: [facility];  Service: Endoscopy;;  . HERNIA REPAIR    . LEFT HEART CATHETERIZATION WITH CORONARY ANGIOGRAM N/A 09/29/2011   Procedure: LEFT HEART CATHETERIZATION WITH CORONARY ANGIOGRAM;  Surgeon: Thurmon Fair, MD;  Location: [facility];  Service: Cardiovascular;  Laterality: N/A;  . NM MYOVIEW LTD  07/08/2011   mild LV dilatation,mild septal hypokinesia  . POLYPECTOMY  05/17/2020   Procedure: POLYPECTOMY;  Surgeon: Vida Rigger, MD;  Location: [facility];  Service: Endoscopy;;  . RIGHT HEART CATH N/A 04/17/2023   Procedure: RIGHT HEART CATH;  Surgeon: Marykay Lex, MD;  Location: [facility] INVASIVE CV LAB;  Service: Cardiovascular;  Laterality: N/A;  . TONSILLECTOMY    . US ECHOCARDIOGRAPHY  09/07/2011   EF 35-45%,mod.ant hypokinesis,boderline LA & RA enlargement,trace MR,TR & PI    Short Social History:  Social History   Tobacco Use  . Smoking status: Never  . Smokeless tobacco: Never  . Tobacco comments:    Second hand smoke for 26 years per pt  Substance  Use Topics  . Alcohol use: No    Alcohol/week: 0.0 standard drinks of alcohol    Allergies  Allergen Reactions  . Ciprofloxacin     Tendons problem in shoulder Other reaction(s): Arthralgias (intolerance), Myalgias (intolerance)  . Empagliflozin Other (See Comments)    Dehydration, uti.  . Levofloxacin     Other reaction(s): Arthralgias (intolerance), Myalgias (intolerance)  . Aricept [Donepezil Hcl] Nausea Only  . Bactrim [Sulfamethoxazole-Trimethoprim] Hives  . Breztri Aerosphere [Budeson-Glycopyrrol-Formoterol] Other (See Comments)    Patient and wife are unsure what happened, however they remember he had a bad reaction  . Levofloxacin Other (See  Comments)    Muscle tighten up in left and right calf  . Misc. Sulfonamide Containing Compounds Rash    Current Outpatient Medications  Medication Sig Dispense Refill  . apixaban (ELIQUIS) 5 MG TABS tablet Take 2 tablets (10mg ) by mouth twice daily for 7 days, then switch to 1 tablet (5mg ) by mouth twice daily. 90 tablet 3  . Continuous Blood Gluc Sensor (FREESTYLE LIBRE 14 DAY SENSOR) MISC Inject 1 Application into the skin every 14 (fourteen) days.    . DULoxetine (CYMBALTA) 30 MG capsule Take 30 mg by mouth in the morning.    . finasteride (PROSCAR) 5 MG tablet Take 5 mg by mouth in the morning.    . fluticasone (FLONASE) 50 MCG/ACT nasal spray Place 1 spray into both nostrils daily.    . furosemide (LASIX) 20 MG tablet Take 10 mg by mouth daily.    Marland Kitchen HYDROcodone-acetaminophen (NORCO/VICODIN) 5-325 MG tablet Take 1-2 tablets by mouth every 4 (four) hours as needed for moderate pain ((score 4 to 6)). (Patient taking differently: Take 1 tablet by mouth every 6 (six) hours as needed for moderate pain (pain score 4-6) ((score 4 to 6)).) 30 tablet 0  . insulin lispro (HUMALOG KWIKPEN) 200 UNIT/ML KwikPen Inject 30-130 Units into the skin in the morning, at noon, and at bedtime. Sliding scale If blood sugar is over 140    . ipratropium (ATROVENT HFA) 17 MCG/ACT inhaler Inhale 2 puffs into the lungs every 6 (six) hours. (Patient not taking: Reported on 07/03/2023)    . loratadine (CLARITIN) 10 MG tablet Take 10 mg by mouth at bedtime.    . memantine (NAMENDA) 10 MG tablet Take 1 tablet (10 mg total) by mouth 2 (two) times daily. 180 tablet 3  . metFORMIN (GLUCOPHAGE) 1000 MG tablet Take 1 tablet (1,000 mg total) by mouth 2 (two) times daily with a meal. Restart on 8/22    . metoprolol succinate (TOPROL-XL) 50 MG 24 hr tablet Take 50 mg by mouth 2 (two) times daily. Take with or immediately following a meal.    . pantoprazole (PROTONIX) 40 MG tablet Take 1 tablet (40 mg total) by mouth 2 (two) times  daily. 180 tablet 0  . REPATHA 140 MG/ML SOSY Inject 1 each into the skin daily. (Patient not taking: Reported on 07/03/2023)    . rosuvastatin (CRESTOR) 20 MG tablet TAKE ONE TABLET BY MOUTH ONE TIME DAILY 90 tablet 3  . sacubitril-valsartan (ENTRESTO) 97-103 MG Take 1 tablet by mouth 2 (two) times daily. 90 tablet 1  . silodosin (RAPAFLO) 8 MG CAPS capsule Take 8 mg by mouth every evening.    Marland Kitchen spironolactone (ALDACTONE) 25 MG tablet Take one-half tablet by mouth daily 45 tablet 0  . TRESIBA FLEXTOUCH 200 UNIT/ML FlexTouch Pen Inject 100 Units into the skin in the morning.     No current  facility-administered medications for this visit.    REVIEW OF SYSTEMS  Positive for ***  All other systems were reviewed and are negative     Objective:  Objective   There were no vitals filed for this visit. There is no height or weight on file to calculate BMI.  Physical Exam General: no acute distress Cardiac: hemodynamically stable Pulm: normal work of breathing Abdomen: non-tender, no pulsatile mass*** Neuro: alert, no focal deficit Extremities: no edema, cyanosis or wounds*** Vascular:   Right: ***  Left: ***   Data: DVT study +---------+---------------+---------+-----------+----------+--------------+   RIGHT   CompressibilityPhasicitySpontaneityPropertiesThrombus  Aging  +---------+---------------+---------+-----------+----------+--------------+   CFV     Full           Yes      Yes                                   +---------+---------------+---------+-----------+----------+--------------+   SFJ     Full                                                          +---------+---------------+---------+-----------+----------+--------------+   FV Prox  Full                                                          +---------+---------------+---------+-----------+----------+--------------+   FV Mid   Full                                                           +---------+---------------+---------+-----------+----------+--------------+   FV DistalFull                                                          +---------+---------------+---------+-----------+----------+--------------+   PFV     Full                                                          +---------+---------------+---------+-----------+----------+--------------+   POP     Full           Yes      Yes                                   +---------+---------------+---------+-----------+----------+--------------+   PTV     Full                                                          +---------+---------------+---------+-----------+----------+--------------+  PERO    Full                                                          +---------+---------------+---------+-----------+----------+--------------+          +---------+---------------+---------+-----------+----------+--------------+   LEFT    CompressibilityPhasicitySpontaneityPropertiesThrombus  Aging  +---------+---------------+---------+-----------+----------+--------------+   CFV     Full           Yes      Yes                                   +---------+---------------+---------+-----------+----------+--------------+   SFJ     Full                                                          +---------+---------------+---------+-----------+----------+--------------+   FV Prox  Full                                                          +---------+---------------+---------+-----------+----------+--------------+   FV Mid   Full                                                          +---------+---------------+---------+-----------+----------+--------------+   FV DistalFull                                                          +---------+---------------+---------+-----------+----------+--------------+    PFV     Full                                                          +---------+---------------+---------+-----------+----------+--------------+   POP     Full           Yes      Yes                                   +---------+---------------+---------+-----------+----------+--------------+   PTV     Partial                                      Acute            +---------+---------------+---------+-----------+----------+--------------+   PERO    None  Acute            +---------+---------------+---------+-----------+----------+--------------+     Summary:  RIGHT:      - There is no evidence of deep vein thrombosis in the lower extremity.    - No cystic structure found in the popliteal fossa.  - Incidental finding - Right popliteal artery aneurysmal dilatation 2.1 x  2.2 cm    LEFT:  - Findings consistent with acute deep vein thrombosis involving the left  posterior tibial veins, and left peroneal veins.    - No cystic structure found in the popliteal fossa.  -Incidental finding - Left popliteal artery aneurysmal dilatation 2.0 x  1.9 cm.      Assessment/Plan:     Donald [name] is a 73 y.o. 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  I discussed the indications for treatment and explained that the threshold of her size is 2 cm. Explained that we will need to obtain a CT scan to better evaluate the anatomy and his runoff prior to discussing intervention. Will obtain a CTA with runoff and plan for follow-up in 2 weeks once the scan is complete   Recommendations to optimize cardiovascular risk: Abstinence from all tobacco products. Blood glucose control with goal A1c < 7%. Blood pressure control with goal blood pressure < 140/90 mmHg. Lipid reduction therapy with goal LDL-C <100 mg/dL  Aspirin 81mg  PO QD.  Atorvastatin 40-80mg  PO QD  (or other "high intensity" statin therapy).     Daria Pastures MD [facility]

## 2023-07-20 ENCOUNTER — Encounter: Payer: Self-pay | Admitting: Vascular Surgery

## 2023-07-20 ENCOUNTER — Ambulatory Visit: Admitting: Vascular Surgery

## 2023-07-20 VITALS — BP 106/73 | HR 108 | Temp 98.0°F | Resp 22 | Ht 68.0 in | Wt 205.7 lb

## 2023-07-20 DIAGNOSIS — I724 Aneurysm of artery of lower extremity: Secondary | ICD-10-CM | POA: Diagnosis not present

## 2023-07-23 ENCOUNTER — Other Ambulatory Visit: Payer: Self-pay

## 2023-07-23 DIAGNOSIS — I724 Aneurysm of artery of lower extremity: Secondary | ICD-10-CM

## 2023-07-25 DIAGNOSIS — I129 Hypertensive chronic kidney disease with stage 1 through stage 4 chronic kidney disease, or unspecified chronic kidney disease: Secondary | ICD-10-CM | POA: Diagnosis not present

## 2023-07-25 DIAGNOSIS — N182 Chronic kidney disease, stage 2 (mild): Secondary | ICD-10-CM | POA: Diagnosis not present

## 2023-07-25 DIAGNOSIS — E118 Type 2 diabetes mellitus with unspecified complications: Secondary | ICD-10-CM | POA: Diagnosis not present

## 2023-07-25 DIAGNOSIS — I5042 Chronic combined systolic (congestive) and diastolic (congestive) heart failure: Secondary | ICD-10-CM | POA: Diagnosis not present

## 2023-07-25 DIAGNOSIS — I2510 Atherosclerotic heart disease of native coronary artery without angina pectoris: Secondary | ICD-10-CM | POA: Diagnosis not present

## 2023-07-25 DIAGNOSIS — Z794 Long term (current) use of insulin: Secondary | ICD-10-CM | POA: Diagnosis not present

## 2023-07-31 DIAGNOSIS — R0902 Hypoxemia: Secondary | ICD-10-CM | POA: Diagnosis not present

## 2023-07-31 DIAGNOSIS — R0981 Nasal congestion: Secondary | ICD-10-CM | POA: Diagnosis not present

## 2023-07-31 DIAGNOSIS — I130 Hypertensive heart and chronic kidney disease with heart failure and stage 1 through stage 4 chronic kidney disease, or unspecified chronic kidney disease: Secondary | ICD-10-CM | POA: Diagnosis not present

## 2023-07-31 DIAGNOSIS — N182 Chronic kidney disease, stage 2 (mild): Secondary | ICD-10-CM | POA: Diagnosis not present

## 2023-07-31 DIAGNOSIS — G8929 Other chronic pain: Secondary | ICD-10-CM | POA: Diagnosis not present

## 2023-07-31 DIAGNOSIS — E118 Type 2 diabetes mellitus with unspecified complications: Secondary | ICD-10-CM | POA: Diagnosis not present

## 2023-07-31 DIAGNOSIS — I82452 Acute embolism and thrombosis of left peroneal vein: Secondary | ICD-10-CM | POA: Diagnosis not present

## 2023-07-31 DIAGNOSIS — I2510 Atherosclerotic heart disease of native coronary artery without angina pectoris: Secondary | ICD-10-CM | POA: Diagnosis not present

## 2023-07-31 DIAGNOSIS — I2699 Other pulmonary embolism without acute cor pulmonale: Secondary | ICD-10-CM | POA: Diagnosis not present

## 2023-07-31 DIAGNOSIS — J069 Acute upper respiratory infection, unspecified: Secondary | ICD-10-CM | POA: Diagnosis not present

## 2023-07-31 DIAGNOSIS — R6889 Other general symptoms and signs: Secondary | ICD-10-CM | POA: Diagnosis not present

## 2023-07-31 DIAGNOSIS — I5042 Chronic combined systolic (congestive) and diastolic (congestive) heart failure: Secondary | ICD-10-CM | POA: Diagnosis not present

## 2023-07-31 DIAGNOSIS — J029 Acute pharyngitis, unspecified: Secondary | ICD-10-CM | POA: Diagnosis not present

## 2023-07-31 DIAGNOSIS — R051 Acute cough: Secondary | ICD-10-CM | POA: Diagnosis not present

## 2023-07-31 DIAGNOSIS — Z1152 Encounter for screening for COVID-19: Secondary | ICD-10-CM | POA: Diagnosis not present

## 2023-07-31 DIAGNOSIS — Z794 Long term (current) use of insulin: Secondary | ICD-10-CM | POA: Diagnosis not present

## 2023-08-01 ENCOUNTER — Ambulatory Visit
Admission: RE | Admit: 2023-08-01 | Discharge: 2023-08-01 | Disposition: A | Discharge status: Home / Self Care | Source: Ambulatory Visit | Attending: Surgery | Admitting: Surgery

## 2023-08-01 DIAGNOSIS — I724 Aneurysm of artery of lower extremity: Secondary | ICD-10-CM

## 2023-08-01 DIAGNOSIS — I700 Atherosclerosis of aorta: Secondary | ICD-10-CM | POA: Diagnosis not present

## 2023-08-01 DIAGNOSIS — K5730 Diverticulosis of large intestine without perforation or abscess without bleeding: Secondary | ICD-10-CM | POA: Diagnosis not present

## 2023-08-01 DIAGNOSIS — N400 Enlarged prostate without lower urinary tract symptoms: Secondary | ICD-10-CM | POA: Diagnosis not present

## 2023-08-01 MED ORDER — IOPAMIDOL (ISOVUE-370) INJECTION 76%
500.0000 mL | Freq: Once | INTRAVENOUS | Status: AC | PRN
  Administered 2023-08-01: 125 mL via INTRAVENOUS

## 2023-08-02 DIAGNOSIS — G4734 Idiopathic sleep related nonobstructive alveolar hypoventilation: Secondary | ICD-10-CM | POA: Diagnosis not present

## 2023-08-03 ENCOUNTER — Ambulatory Visit: Payer: PPO | Attending: Cardiovascular Disease | Admitting: Cardiovascular Disease

## 2023-08-03 ENCOUNTER — Encounter: Payer: Self-pay | Admitting: Cardiovascular Disease

## 2023-08-03 VITALS — BP 122/80 | HR 88 | Ht 68.0 in | Wt 206.0 lb

## 2023-08-03 DIAGNOSIS — E119 Type 2 diabetes mellitus without complications: Secondary | ICD-10-CM | POA: Diagnosis not present

## 2023-08-03 DIAGNOSIS — I5042 Chronic combined systolic (congestive) and diastolic (congestive) heart failure: Secondary | ICD-10-CM | POA: Diagnosis not present

## 2023-08-03 DIAGNOSIS — I2699 Other pulmonary embolism without acute cor pulmonale: Secondary | ICD-10-CM

## 2023-08-03 DIAGNOSIS — I447 Left bundle-branch block, unspecified: Secondary | ICD-10-CM

## 2023-08-03 DIAGNOSIS — Z794 Long term (current) use of insulin: Secondary | ICD-10-CM | POA: Diagnosis not present

## 2023-08-03 DIAGNOSIS — R9431 Abnormal electrocardiogram [ECG] [EKG]: Secondary | ICD-10-CM | POA: Diagnosis not present

## 2023-08-03 DIAGNOSIS — I10 Essential (primary) hypertension: Secondary | ICD-10-CM | POA: Diagnosis not present

## 2023-08-03 DIAGNOSIS — I25810 Atherosclerosis of coronary artery bypass graft(s) without angina pectoris: Secondary | ICD-10-CM

## 2023-08-03 DIAGNOSIS — E782 Mixed hyperlipidemia: Secondary | ICD-10-CM | POA: Diagnosis not present

## 2023-08-03 NOTE — Patient Instructions (Addendum)
 Medication Instructions:  No changes *If you need a refill on your cardiac medications before your next appointment, please call your pharmacy*  Follow-Up: At [facility], you and your health needs are our priority.  As part of our continuing mission to provide you with exceptional heart care, our providers are all part of one team.  This team includes your primary Cardiologist (physician) and Advanced Practice Providers or APPs (Physician Assistants and Nurse Practitioners) who all work together to provide you with the care you need, when you need it.  Your next appointment:   8 month(s)  Provider:   Thurmon Fair, MD     We recommend signing up for the patient portal called "MyChart".  Sign up information is provided on this After Visit Summary.  MyChart is used to connect with patients for Virtual Visits (Telemedicine).  Patients are able to view lab/test results, encounter notes, upcoming appointments, etc.  Non-urgent messages can be sent to your provider as well.   To learn more about what you can do with MyChart, go to [email or web address].        1st Floor: - Lobby - Registration  - Pharmacy  - Lab - Cafe  2nd Floor: - PV Lab - Diagnostic Testing (echo, CT, nuclear med)  3rd Floor: - Vacant  4th Floor: - TCTS (cardiothoracic surgery) - AFib Clinic - Structural Heart Clinic - Vascular Surgery  - Vascular Ultrasound  5th Floor: - HeartCare Cardiology (general and EP) - Clinical Pharmacy for coumadin, hypertension, lipid, weight-loss medications, and med management appointments    Valet parking services will be available as well.

## 2023-08-03 NOTE — Progress Notes (Signed)
 Cardiology Office Note:    Date:  08/11/2023   ID:  Donald [name], DOB 09/02/1950, MRN [id number]  PCP:  Chilton Greathouse, MD   [facility] Providers Cardiologist:  Thurmon Fair, MD     Referring MD: Chilton Greathouse, MD   No chief complaint on file.    History of Present Illness:    Donald [name] is a 73 y.o. male with a hx of nonobstructive CAD, nonischemic cardiomyopathy dating back to at least 2013 when he had coronary angiography that showed no evidence of significant obstructive disease.  Most recent LVEF by echo 08/09/2022 was 40%.  Additional medical problems include treated hypertension, type 2 diabetes mellitus, hypercholesterolemia, OSA on CPAP (Dr. Vickey Huger), chronic bronchitis.    He had abrupt onset of dyspnea in November 2024 and at that time his chest CT angiogram did not show evidence of pulmonary embolism.  We have been trying to identify the cause of his dyspnea, performed a right heart catheterization which showed normal left heart filling pressures, and were getting ready to perform a left heart catheterization just in case his unexplained dyspnea was due to coronary insufficiency.    He once again developed severe shortness of breath 07/03/2023 and this time his coronary CT angiogram showed evidence of pulmonary embolism.  Retrospectively it is very likely that the November episode and the subsequent symptoms were also due to pulmonary embolism.  Anticoagulation was with Eliquis, now down to the 5 mg twice daily dose.  He required oxygen supplementation 24 hours a day for a while, but now only uses oxygen at night.  He is doing better in the last 1-2 weeks.  More recently, he had 5 days of severe cough and upper airway congestion and increased secretions and Dr. Felipa Eth performed a chest x-ray which did not show any abnormalities.  COVID and flu test were negative.  He is slowly improving on cough medication and antibiotics.  His dose of diuretic has changed  quite a bit in the last few weeks, but he is currently taking furosemide 20 mg once daily.  He is also on spironolactone, Entresto and metoprolol succinate in maximum tolerated doses.  He did not tolerate SGLT2 inhibitors due to urinary tract infections.  Coronary CT angiography performed in January 2023 showed extensive atherosclerotic plaque (coronary calcium score 870, 84th percentile), but no significant discrete coronary stenoses.  He does have some lower extremity edema and he remains above our previous estimation of his dry weight of 200 pounds.  At the time of cardiac catheterization in December 2024 he weighed 194 pounds, and his filling pressures appear to be normal (PCWP 11 mmHg, PAP 27/20, cardiac index 3.2 L/min/m, RAP 9 mmHg).  BNP on 03/31/2023, when he weighed around 200 pounds and when he was short of breath, was only 26, strongly suggesting that his shortness of breath is not from congestive heart failure (retrospectively he had a pulmonary embolism).  Metabolic control remains suboptimal.  A very recent hemoglobin A1c was up to 8.9% and his last lipid profile showed that his LDL was 104 and his triglycerides were 406.  He had a CT angiogram of the abdominal aorta with runoff for PAD.  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  Again noted was aortic atherosclerosis.  In 2013, nuclear stress that showed a left ventricular ejection fraction of 50%, with a normal perfusion pattern and mild  left ventricular dilatation.  His echocardiogram also showed an EF of 50-55% with mild diffuse left ventricular hypokinesis and no significant valvular abnormalities.  In 2016, his pulmonary specialist Dr. Marchelle Gearing ordered a cardiopulmonary excise stress test that showed mild-mod reduced functional capacity without any clear evidence of cardiovascular limitations.  Chronotropic response was  normal.  The peak VO2 was mildly reduced at 23.5 mL/kilogram/minute (76% of predicted).  VE/VCO2 slope was normal.echocardiogram performed 03/22/2021 shows further reduction in LVEF which is now down to 35-40%.  Diastolic parameters suggest normal filling pressures.  The systolic PA pressure is normal at 29 mmHg. Once again there are no significant valvular abnormalities.  Despite transition to [facility] since maximum dose), metoprolol succinate and spironolactone LVEF has not really increased much, now 40% on echocardiogram performed April 2023.  He had allergic response to SGLT2 inhibitors and has a history of very frequent urinary tract infections due to ureteral obstruction.   Past Medical History:  Diagnosis Date   Allergic rhinitis, seasonal    Asthma    Bronchitis    CHF (congestive heart failure) (HCC)    Chronic kidney disease, stage I    Cognitive impairment    Effects Short Term Memory   collar bone    dislocation left side 05/2014   Degenerative joint disease of knee    bilateral   Diabetes mellitus    Type 2   Dysrhythmia    PVCs   Fatty liver    GERD (gastroesophageal reflux disease)    Headache    Heachaces r/t eyes   Heart murmur    history of   History of acute prostatitis    History of hematuria    History of hiatal hernia    H/O   Hyperlipidemia    Hypertension    IgA nephropathy    OSA on CPAP    Pneumonia    Sinusitis     Past Surgical History:  Procedure Laterality Date   BIOPSY  05/17/2020   Procedure: BIOPSY;  Surgeon: Vida Rigger, MD;  Location: [facility];  Service: Endoscopy;;   CARDIAC CATHETERIZATION  09/29/2011   normal   CATARACT EXTRACTION Bilateral    CERVICAL SPINE SURGERY     COLONOSCOPY WITH PROPOFOL N/A 05/17/2020   Procedure: COLONOSCOPY WITH PROPOFOL;  Surgeon: Vida Rigger, MD;  Location: [facility];  Service: Endoscopy;  Laterality: N/A;   deviated septum     1990s   ESOPHAGOGASTRODUODENOSCOPY (EGD) WITH PROPOFOL N/A  05/17/2020   Procedure: ESOPHAGOGASTRODUODENOSCOPY (EGD) WITH PROPOFOL;  Surgeon: Vida Rigger, MD;  Location: [facility];  Service: Endoscopy;  Laterality: N/A;   HEMOSTASIS CLIP PLACEMENT  05/17/2020   Procedure: HEMOSTASIS CLIP PLACEMENT;  Surgeon: Vida Rigger, MD;  Location: [facility];  Service: Endoscopy;;   HERNIA REPAIR     LEFT HEART CATHETERIZATION WITH CORONARY ANGIOGRAM N/A 09/29/2011   Procedure: LEFT HEART CATHETERIZATION WITH CORONARY ANGIOGRAM;  Surgeon: Thurmon Fair, MD;  Location: [facility];  Service: Cardiovascular;  Laterality: N/A;   NM MYOVIEW LTD  07/08/2011   mild LV dilatation,mild septal hypokinesia   POLYPECTOMY  05/17/2020   Procedure: POLYPECTOMY;  Surgeon: Vida Rigger, MD;  Location: [facility];  Service: Endoscopy;;   RIGHT HEART CATH N/A 04/17/2023   Procedure: RIGHT HEART CATH;  Surgeon: Marykay Lex, MD;  Location: [facility] INVASIVE CV LAB;  Service: Cardiovascular;  Laterality: N/A;   TONSILLECTOMY     US ECHOCARDIOGRAPHY  09/07/2011   EF 35-45%,mod.ant hypokinesis,boderline LA & RA enlargement,trace MR,TR &  PI    Current Medications: No outpatient medications have been marked as taking for the 08/03/23 encounter (Office Visit) with Thurmon Fair, MD.     Allergies:   Ciprofloxacin, Empagliflozin, Levofloxacin, Aricept [donepezil hcl], Bactrim [sulfamethoxazole-trimethoprim], Breztri aerosphere [budeson-glycopyrrol-formoterol], Levofloxacin, and Misc. sulfonamide containing compounds   EKGs/Labs/Other Studies Reviewed:    The following studies were reviewed today: Right heart catheterization 04/17/2023:  Right Heart Pressures Hemodynamic findings consistent with mild pulmonary hypertension. Borderline pulmonary pretension: PAP-mean 27/20-23 mmHg.  PCWP 11 mmHg.  Ao sat 93%, PA sat 65%.   Cardiac Output-Index: Hiram Comber) 5.87-2.91; (TD)6.46-3.2  Right Atrium Right atrial pressure is elevated. Mean RAP 9 mmHg  Right Ventricle RVP-EDP 29/14-8 mmHg       RECOMMENDATIONS   In the absence of any other complications or medical issues, we expect the patient to be ready for discharge from a cath perspective on 04/17/2023.   Continue evaluate for nonpulmonary pretension etiology for dyspnea. After other options are exhausted, could consider right heart cath with bicycle to assess for exercise related elevated pressures.    Echocardiogram 05/09/2022:    1. Left ventricular ejection fraction, by estimation, is 40 to 45%. The  left ventricle has mildly decreased function. The left ventricle has no  regional wall motion abnormalities. Left ventricular diastolic parameters  are consistent with Grade I  diastolic dysfunction (impaired relaxation). GLS -16.1%.   2. Right ventricular systolic function is normal. The right ventricular  size is normal.   3. The mitral valve is normal in structure. No evidence of mitral valve  regurgitation. No evidence of mitral stenosis.   4. The aortic valve is normal in structure. Aortic valve regurgitation is  trivial. No aortic stenosis is present.   5. Aortic dilatation noted. There is borderline dilatation of the  ascending aorta, measuring 36 mm.   6. The inferior vena cava is normal in size with greater than 50%  respiratory variability, suggesting right atrial pressure of 3 mmHg.     Event monitor 05/09/2022:    Rhythm is normal sinus rhythm with normal circadian variation.   There are rare isolated premature atrial contractions and a single 4 beat run of nonsustained atrial tachycardia.  Atrial fibrillation was not seen.   There were rare premature ventricular complexes, very rare couplets and a brief period of ventricular try Gemini.  When tachycardia is not seen.   There is no significant bradycardia.   Symptom driven recordings show isolated premature atrial contractions or premature ventricular contractions.   Minimally abnormal event monitor.  Patient's symptoms are associated with isolated  ectopic beats.  EKG: Personally reviewed the most recent tracing from 07/02/2023, which shows sinus tachycardia and nonspecific T wave inversion leads V1-V3.  EKG Interpretation Date/Time:    Ventricular Rate:    PR Interval:    QRS Duration:    QT Interval:    QTC Calculation:   R Axis:      Text Interpretation:            Recent Labs: 12/06/2022: NT-Pro BNP 46 07/02/2023: B Natriuretic Peptide 40.3; Magnesium 2.0 08/08/2023: ALT 26; BUN 46; Creatinine 1.56; Hemoglobin 11.5; Platelet Count 288; Potassium 3.7; Sodium 138  Recent Lipid Panel    Component Value Date/Time   CHOL 233 (H) 05/13/2021 0949   TRIG 386 (H) 05/13/2021 0949   HDL 40 05/13/2021 0949   CHOLHDL 5.8 (H) 05/13/2021 0949   LDLCALC 124 (H) 05/13/2021 0949     Risk Assessment/Calculations:  Physical Exam:    VS:  BP 122/80 (BP Location: Left Arm, Patient Position: Sitting, Cuff Size: Large)   Pulse 88   Ht 5\' 8"  (1.727 m)   Wt 206 lb (93.4 kg)   SpO2 90%   BMI 31.32 kg/m     Wt Readings from Last 3 Encounters:  08/10/23 208 lb 9.6 oz (94.6 kg)  08/08/23 209 lb 4.8 oz (94.9 kg)  08/03/23 206 lb (93.4 kg)      General: Alert, oriented x3, no distress, has upper airway congestion but appears comfortable. Head: no evidence of trauma, PERRL, EOMI, no exophtalmos or lid lag, no myxedema, no xanthelasma; normal ears, nose and oropharynx Neck: normal jugular venous pulsations and no hepatojugular reflux; brisk carotid pulses without delay and no carotid bruits Chest: clear to auscultation, no signs of consolidation by percussion or palpation, normal fremitus, symmetrical and full respiratory excursions Cardiovascular: normal position and quality of the apical impulse, regular rhythm, normal first and second heart sounds, no murmurs, rubs or gallops Abdomen: no tenderness or distention, no masses by palpation, no abnormal pulsatility or arterial bruits, normal bowel sounds, no  hepatosplenomegaly Extremities: Trivial bilateral ankle edema, normal distal pulses Neurological: grossly nonfocal Psych: Normal mood and affect   ASSESSMENT:    1. Bilateral pulmonary embolism (HCC)   2. Chronic combined systolic and diastolic congestive heart failure (HCC)   3. Coronary artery disease involving coronary bypass graft of native heart without angina pectoris   4. Type 2 diabetes mellitus without complication, with long-term current use of insulin (HCC)   5. Essential hypertension   6. Mixed hyperlipidemia   7. Nonspecific abnormal electrocardiogram (ECG) (EKG)     PLAN:   In order of problems listed above:  Pulmonary embolism: Retrospectively, his unexplained dyspnea was due to pulmonary embolism.  He is improving now on anticoagulation.  We discussed duration of therapy.  He had 2 unprovoked pulmonary emboli probably, in November 2024 and again in February 2025.  He should continue oral anticoagulants for minimum of a year and I would actually favor lifelong anticoagulation.  Will meet up with him again towards the end of the year to discuss this. CHF: He has longstanding nonischemic cardiomyopathy with mildly depressed LVEF.  They are at most minimal signs of clinical hypervolemia and he is only roughly 6 pounds above our previous estimated dry weight.  I would recommend continuing furosemide 20 mg daily.  Recent right heart catheterization showed normal left heart filling pressures and his recent BNP was undetectable, when he weighed roughly 10 pounds less than today.  Continue Entresto, metoprolol, spironolactone, unable to take SGLT2 inhibitors due to urinary tract infections. CAD: Nonobstructive by coronary CTA performed in 2023.  Ideally would like his LDL cholesterol less than 70.   AKI: We had diurese him to the point of worsening kidney function.  Based on the labs over the last year his probable baseline creatinine is around 1.15-1.28. DM: Poorly controlled.  Did  not tolerate SGLT2 inhibitors. HTN: Excellent on current medications. HLP: Continue rosuvastatin.  LDL is not at target and his triglycerides are high.  Suspect lipid parameters are improved with better glycemic control. PAD: Small bilateral popliteal aneurysms without evidence of peripheral arterial insufficiency, will be managed conservatively. OSA: He reports that he is compliant with CPAP (Monitored by Dr. Vickey Huger).  Denies daytime hypersomnolence Cognitive deficits: Unchanged. Also followed by Dr. Vickey Huger in the neurology clinic.  Note absence of specific abnormalities on PET scan of the brain  and MRI of the brain (although he does have some mild chronic microvascular ischemic changes in the hemispheres and pons).       Medication Adjustments/Labs and Tests Ordered: Current medicines are reviewed at length with the patient today.  Concerns regarding medicines are outlined above.  No orders of the defined types were placed in this encounter.  No orders of the defined types were placed in this encounter.   Patient Instructions  Medication Instructions:  No changes *If you need a refill on your cardiac medications before your next appointment, please call your pharmacy*  Follow-Up: At [facility], you and your health needs are our priority.  As part of our continuing mission to provide you with exceptional heart care, our providers are all part of one team.  This team includes your primary Cardiologist (physician) and Advanced Practice Providers or APPs (Physician Assistants and Nurse Practitioners) who all work together to provide you with the care you need, when you need it.  Your next appointment:   8 month(s)  Provider:   Thurmon Fair, MD     We recommend signing up for the patient portal called "MyChart".  Sign up information is provided on this After Visit Summary.  MyChart is used to connect with patients for Virtual Visits (Telemedicine).  Patients are able to  view lab/test results, encounter notes, upcoming appointments, etc.  Non-urgent messages can be sent to your provider as well.   To learn more about what you can do with MyChart, go to [email or web address].        1st Floor: - Lobby - Registration  - Pharmacy  - Lab - Cafe  2nd Floor: - PV Lab - Diagnostic Testing (echo, CT, nuclear med)  3rd Floor: - Vacant  4th Floor: - TCTS (cardiothoracic surgery) - AFib Clinic - Structural Heart Clinic - Vascular Surgery  - Vascular Ultrasound  5th Floor: - HeartCare Cardiology (general and EP) - Clinical Pharmacy for coumadin, hypertension, lipid, weight-loss medications, and med management appointments    Valet parking services will be available as well.      Signed, Thurmon Fair, MD  08/11/2023 6:52 PM    [facility]

## 2023-08-07 ENCOUNTER — Other Ambulatory Visit: Payer: Self-pay | Admitting: Cardiology

## 2023-08-08 ENCOUNTER — Other Ambulatory Visit: Payer: Self-pay

## 2023-08-08 ENCOUNTER — Inpatient Hospital Stay

## 2023-08-08 ENCOUNTER — Inpatient Hospital Stay: Attending: Oncology | Admitting: Oncology

## 2023-08-08 ENCOUNTER — Encounter: Payer: Self-pay | Admitting: Oncology

## 2023-08-08 ENCOUNTER — Telehealth: Payer: Self-pay

## 2023-08-08 ENCOUNTER — Other Ambulatory Visit: Payer: Self-pay | Admitting: Cardiovascular Disease

## 2023-08-08 VITALS — BP 111/74 | HR 102 | Temp 97.6°F | Resp 17 | Ht 68.0 in | Wt 209.3 lb

## 2023-08-08 DIAGNOSIS — Z7901 Long term (current) use of anticoagulants: Secondary | ICD-10-CM | POA: Diagnosis not present

## 2023-08-08 DIAGNOSIS — Z9889 Other specified postprocedural states: Secondary | ICD-10-CM | POA: Insufficient documentation

## 2023-08-08 DIAGNOSIS — M79673 Pain in unspecified foot: Secondary | ICD-10-CM | POA: Diagnosis not present

## 2023-08-08 DIAGNOSIS — N02B9 Other recurrent and persistent immunoglobulin A nephropathy: Secondary | ICD-10-CM | POA: Diagnosis not present

## 2023-08-08 DIAGNOSIS — I2699 Other pulmonary embolism without acute cor pulmonale: Secondary | ICD-10-CM

## 2023-08-08 DIAGNOSIS — I82402 Acute embolism and thrombosis of unspecified deep veins of left lower extremity: Secondary | ICD-10-CM | POA: Diagnosis not present

## 2023-08-08 DIAGNOSIS — I82492 Acute embolism and thrombosis of other specified deep vein of left lower extremity: Secondary | ICD-10-CM

## 2023-08-08 DIAGNOSIS — R262 Difficulty in walking, not elsewhere classified: Secondary | ICD-10-CM | POA: Diagnosis not present

## 2023-08-08 DIAGNOSIS — R059 Cough, unspecified: Secondary | ICD-10-CM | POA: Diagnosis not present

## 2023-08-08 DIAGNOSIS — I429 Cardiomyopathy, unspecified: Secondary | ICD-10-CM

## 2023-08-08 LAB — CMP (CANCER CENTER ONLY)
ALT: 26 U/L (ref 0–44)
AST: 24 U/L (ref 15–41)
Albumin: 4.2 g/dL (ref 3.5–5.0)
Alkaline Phosphatase: 47 U/L (ref 38–126)
Anion gap: 10 (ref 5–15)
BUN: 46 mg/dL — ABNORMAL HIGH (ref 8–23)
CO2: 26 mmol/L (ref 22–32)
Calcium: 9.4 mg/dL (ref 8.9–10.3)
Chloride: 102 mmol/L (ref 98–111)
Creatinine: 1.56 mg/dL — ABNORMAL HIGH (ref 0.61–1.24)
GFR, Estimated: 47 mL/min — ABNORMAL LOW (ref >60)
Glucose, Bld: 198 mg/dL — ABNORMAL HIGH (ref 70–99)
Potassium: 3.7 mmol/L (ref 3.5–5.1)
Sodium: 138 mmol/L (ref 135–145)
Total Bilirubin: 0.4 mg/dL (ref 0.0–1.2)
Total Protein: 7.2 g/dL (ref 6.5–8.1)

## 2023-08-08 LAB — CBC WITH DIFFERENTIAL (CANCER CENTER ONLY)
Abs Immature Granulocytes: 0.05 10*3/uL (ref 0.00–0.07)
Basophils Absolute: 0 10*3/uL (ref 0.0–0.1)
Basophils Relative: 0 %
Eosinophils Absolute: 0.5 10*3/uL (ref 0.0–0.5)
Eosinophils Relative: 6 %
HCT: 38.1 % — ABNORMAL LOW (ref 39.0–52.0)
Hemoglobin: 11.5 g/dL — ABNORMAL LOW (ref 13.0–17.0)
Immature Granulocytes: 1 %
Lymphocytes Relative: 17 %
Lymphs Abs: 1.6 10*3/uL (ref 0.7–4.0)
MCH: 23.3 pg — ABNORMAL LOW (ref 26.0–34.0)
MCHC: 30.2 g/dL (ref 30.0–36.0)
MCV: 77.3 fL — ABNORMAL LOW (ref 80.0–100.0)
Monocytes Absolute: 0.6 10*3/uL (ref 0.1–1.0)
Monocytes Relative: 6 %
Neutro Abs: 6.6 10*3/uL (ref 1.7–7.7)
Neutrophils Relative %: 70 %
Platelet Count: 288 10*3/uL (ref 150–400)
RBC: 4.93 MIL/uL (ref 4.22–5.81)
RDW: 18.7 % — ABNORMAL HIGH (ref 11.5–15.5)
WBC Count: 9.4 10*3/uL (ref 4.0–10.5)
nRBC: 0 % (ref 0.0–0.2)

## 2023-08-08 LAB — D-DIMER, QUANTITATIVE: D-Dimer, Quant: 0.47 ug{FEU}/mL (ref 0.00–0.50)

## 2023-08-08 NOTE — Assessment & Plan Note (Signed)
 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   Significant symptom improvement reported since starting anticoagulation, resumed gym activities.   Current treatment involves anticoagulation therapy with Eliquis 5 mg twice daily.   Will check D-dimer today for baseline.  It would also be important to rule out hypercoagulable state.  Hence we will obtain thrombophilia workup with factor V Leiden mutation, prothrombin gene mutation, beta-2 glycoprotein antibodies, anticardiolipin antibodies, lupus anticoagulant testing today.  Rest of the hypercoagulable workup will be deferred as it can be falsely abnormal given recent thromboembolism.  - Continue anticoagulation therapy with 5 mg twice daily for six months.  We may potentially extend it to 1 year depending on results of above workup.  I will give him a phone call next week with above mentioned results.  - Plan follow-up blood tests in five months to reassess clotting risk and determine the duration of anticoagulation therapy.

## 2023-08-08 NOTE — Assessment & Plan Note (Signed)
 Continue anticoagulation as mentioned above

## 2023-08-08 NOTE — Progress Notes (Signed)
 [facility]  HEMATOLOGY CLINIC CONSULTATION NOTE   PATIENT NAME: Donald [name]   MR#: [id number] DOB: [birth year]  DATE OF SERVICE: 08/08/2023  REFERRING PROVIDER  [name], NP  Patient Care Team: Chilton Greathouse, MD as PCP - General (Internal Medicine) Croitoru, Rachelle Hora, MD as PCP - Cardiology (Cardiology) Sol Passer, NP as Nurse Practitioner (Internal Medicine)   REASON FOR CONSULTATION/ CHIEF COMPLAINT:  Bilateral pulmonary emboli and DVT of the left lower extremity diagnosed in February 2025.  ASSESSMENT & PLAN:  Donald [name] is a 73 y.o. gentleman with a past medical history of hypertension, diabetes mellitus, dyslipidemia, IgA nephropathy, OSA on CPAP, CHF, CKD Stage 3b, was referred to our service for evaluation of possible hypercoagulable state, given recent bilateral pulmonary emboli and DVT of the left lower extremity, diagnosed on 07/03/2023.  Bilateral pulmonary embolism (HCC) Worsening dyspnea led to diagnosis of bilateral pulmonary emboli and left leg DVT on 07/02/2023.   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   Significant symptom improvement reported since starting anticoagulation, resumed gym activities.   Current treatment involves anticoagulation therapy with Eliquis 5 mg twice daily.   Will check D-dimer today for baseline.  It would also be important to rule out hypercoagulable state.  Hence we will obtain thrombophilia workup with factor V Leiden mutation, prothrombin gene mutation, beta-2 glycoprotein antibodies, anticardiolipin antibodies, lupus anticoagulant testing today.  Rest of the hypercoagulable workup will be deferred as it can be falsely abnormal given recent thromboembolism.  - Continue anticoagulation therapy with 5 mg twice daily for six months.  We may potentially  extend it to 1 year depending on results of above workup.  I will give him a phone call next week with above mentioned results.  - Plan follow-up blood tests in five months to reassess clotting risk and determine the duration of anticoagulation therapy.  Acute deep vein thrombosis (DVT) of left lower extremity (HCC) Continue anticoagulation as mentioned above  IgA nephropathy IgA nephropathy monitored by primary care physician. No significant change in renal function. Potential increased risk of clotting due to protein imbalances. - Monitor renal function through regular blood tests. - Consider nephrology referral if significant changes in renal function occur.  S/P colonoscopy with polypectomy Colonoscopy in January 2022 revealed hemorrhoids, diverticulosis, and a benign polyp. Recommended repeat in three years. Current anticoagulation therapy affects timing of colonoscopy. Ideal to delay until off anticoagulation, but if necessary, pause therapy for 3-5 days prior to procedure after June. - Delay colonoscopy until at least September, after reassessment of anticoagulation therapy. - If necessary, plan to pause anticoagulation therapy for 3-5 days prior to the procedure, but ideally wait until off anticoagulation.    I reviewed lab results and outside records for this visit and discussed relevant results with the patient. Diagnosis, plan of care and treatment options were also discussed in detail with the patient. Opportunity provided to ask questions and answers provided to his apparent satisfaction. Provided instructions to call our clinic with any problems, questions or concerns prior to return visit. I recommended to continue follow-up with PCP and sub-specialists. He verbalized understanding and agreed with the plan. No barriers to learning was detected.  Meryl Crutch, MD  08/08/2023 11:39 AM  [facility] - A DEPT OF [facility] [address]  [place] [address] Dept: [phone] Dept Fax: [phone]   HISTORY OF PRESENT ILLNESS:  Discussed the use of AI scribe software for clinical note transcription with the patient, who gave verbal consent to proceed.   He presented to the ED on 07/02/2023 with worsening shortness of breath.  He was found to have bilateral pulmonary emboli, right greater than left on CT angiogram of the chest.  There was no evidence of right heart strain.  Ultrasound Doppler showed DVT in the left lower extremity in the posterior tibial and left peroneal veins.  PE/DVT was felt likely to be provoked by sedentary lifestyle.  He was started on Eliquis twice daily with plan to continue at least for 6 months.  He was referred to [place] for further evaluation and for recommendations regarding duration of anticoagulation.  He is accompanied by Donald [name], his wife.  He was not on bed rest prior to the diagnosis of DVT/PE but had reduced activity following back surgery in the spring of the previous year. His breathing difficulties began around Thanksgiving, and he was unable to exercise due to shortness of breath. He has been using oxygen at night and a CPAP machine due to low oxygen levels during sleep. Since starting blood thinners, his symptoms have improved significantly, and he has returned to the gym. No current leg pain, swelling, or redness. No blood in stools or black stools.  He has a history of IgA nephropathy, which was diagnosed after blood was found in his urine. He has not seen a nephrologist recently but has been monitored by his primary care physician. He has not received treatment for nephropathy, and his kidney function numbers have remained stable.  His social history includes being retired, spending time at home, and engaging in activities such as reading and using the computer. He attempts to work out three times a week and sees a Systems analyst twice a week. He has  difficulty walking due to foot pain.  He denies fever, cough, diarrhea, or other infectious symptoms.  He denies epistaxis, bloody stool, melena, hematuria, bruising or other bleeding symptoms. He also denies unintentional weight loss, or other constitutional symptoms.  MEDICAL HISTORY Past Medical History:  Diagnosis Date   Allergic rhinitis, seasonal    Asthma    Bronchitis    CHF (congestive heart failure) (HCC)    Chronic kidney disease, stage I    Cognitive impairment    Effects Short Term Memory   collar bone    dislocation left side 05/2014   Degenerative joint disease of knee    bilateral   Diabetes mellitus    Type 2   Dysrhythmia    PVCs   Fatty liver    GERD (gastroesophageal reflux disease)    Headache    Heachaces r/t eyes   Heart murmur    history of   History of acute prostatitis    History of hematuria    History of hiatal hernia    H/O   Hyperlipidemia    Hypertension    IgA nephropathy    OSA on CPAP    Pneumonia    Sinusitis      SURGICAL HISTORY Past Surgical History:  Procedure Laterality Date   BIOPSY  05/17/2020   Procedure: BIOPSY;  Surgeon: Vida Rigger, MD;  [facility]: [facility];  Service: Endoscopy;;   CARDIAC CATHETERIZATION  09/29/2011   normal   CATARACT EXTRACTION Bilateral    CERVICAL SPINE SURGERY     COLONOSCOPY WITH PROPOFOL N/A 05/17/2020  Procedure: COLONOSCOPY WITH PROPOFOL;  Surgeon: Vida Rigger, MD;  [facility]: [facility];  Service: Endoscopy;  Laterality: N/A;   deviated septum     1990s   ESOPHAGOGASTRODUODENOSCOPY (EGD) WITH PROPOFOL N/A 05/17/2020   Procedure: ESOPHAGOGASTRODUODENOSCOPY (EGD) WITH PROPOFOL;  Surgeon: Vida Rigger, MD;  Location: [facility];  Service: Endoscopy;  Laterality: N/A;   HEMOSTASIS CLIP PLACEMENT  05/17/2020   Procedure: HEMOSTASIS CLIP PLACEMENT;  Surgeon: Vida Rigger, MD;  Location: [facility];  Service: Endoscopy;;   HERNIA REPAIR     LEFT HEART CATHETERIZATION WITH CORONARY  ANGIOGRAM N/A 09/29/2011   Procedure: LEFT HEART CATHETERIZATION WITH CORONARY ANGIOGRAM;  Surgeon: Thurmon Fair, MD;  Location: [facility];  Service: Cardiovascular;  Laterality: N/A;   NM MYOVIEW LTD  07/08/2011   mild LV dilatation,mild septal hypokinesia   POLYPECTOMY  05/17/2020   Procedure: POLYPECTOMY;  Surgeon: Vida Rigger, MD;  Location: [facility];  Service: Endoscopy;;   RIGHT HEART CATH N/A 04/17/2023   Procedure: RIGHT HEART CATH;  Surgeon: Marykay Lex, MD;  Location: [facility] INVASIVE CV LAB;  Service: Cardiovascular;  Laterality: N/A;   TONSILLECTOMY     US ECHOCARDIOGRAPHY  09/07/2011   EF 35-45%,mod.ant hypokinesis,boderline LA & RA enlargement,trace MR,TR & PI     SOCIAL HISTORY: He reports that he has never smoked. He has never used smokeless tobacco. He reports that he does not drink alcohol and does not use drugs. Social History   Socioeconomic History   Marital status: Married    Spouse name: Diane   Number of children: 1   Years of education: 14   Highest education level: Not on file  Occupational History   Occupation: Teaching laboratory technician: Nurse, children's  Tobacco Use   Smoking status: Never   Smokeless tobacco: Never   Tobacco comments:    Second hand smoke for 26 years per pt  Vaping Use   Vaping status: Never Used  Substance and Sexual Activity   Alcohol use: No    Alcohol/week: 0.0 standard drinks of alcohol   Drug use: No   Sexual activity: Yes  Other Topics Concern   Not on file  Social History Narrative   Patient is married (Diane) and lives at home with his wife.   Patient has one child.   Patient is working full-time.   Patient has a Scientist, research (physical sciences).   Patient is left-handed.   Patient drinks one glass of tea daily, one soda daily.   Social Drivers of Corporate investment banker Strain: Not on file  Food Insecurity: No Food Insecurity (07/03/2023)   Hunger Vital Sign    Worried About Running Out of Food in the Last Year:  Never true    Ran Out of Food in the Last Year: Never true  Transportation Needs: No Transportation Needs (07/03/2023)   PRAPARE - Administrator, Civil Service (Medical): No    Lack of Transportation (Non-Medical): No  Physical Activity: Not on file  Stress: Not on file  Social Connections: Moderately Isolated (07/03/2023)   Social Connection and Isolation Panel [NHANES]    Frequency of Communication with Friends and Family: Twice a week    Frequency of Social Gatherings with Friends and Family: Once a week    Attends Religious Services: Never    Database administrator or Organizations: No    Attends Banker Meetings: Never    Marital Status: Married  Catering manager Violence: Not At Risk (07/03/2023)  Humiliation, Afraid, Rape, and Kick questionnaire    Fear of Current or Ex-Partner: No    Emotionally Abused: No    Physically Abused: No    Sexually Abused: No    FAMILY HISTORY: His family history includes Coronary artery disease in his father; Diabetes type II in his father; Heart attack in his father.  CURRENT MEDICATIONS   Current Outpatient Medications  Medication Instructions   apixaban (ELIQUIS) 5 MG TABS tablet Take 2 tablets (10mg ) by mouth twice daily for 7 days, then switch to 1 tablet (5mg ) by mouth twice daily.   Continuous Blood Gluc Sensor (FREESTYLE LIBRE 14 DAY SENSOR) MISC 1 Application, Every 14 days   DULoxetine (CYMBALTA) 30 mg, Every morning   finasteride (PROSCAR) 5 mg, Every morning   fluticasone (FLONASE) 50 MCG/ACT nasal spray 1 spray, Daily   furosemide (LASIX) 10 mg, Daily   HumaLOG KwikPen 30-130 Units, Subcutaneous, 3 times daily, Sliding scale If blood sugar is over 140   HYDROcodone-acetaminophen (NORCO/VICODIN) 5-325 MG tablet 1-2 tablets, Oral, Every 4 hours PRN   ipratropium (ATROVENT HFA) 17 MCG/ACT inhaler 2 puffs, Every 6 hours   loratadine (CLARITIN) 10 mg, Daily at bedtime   memantine (NAMENDA) 10 mg, Oral, 2  times daily   metFORMIN (GLUCOPHAGE) 1,000 mg, Oral, 2 times daily with meals, Restart on 8/22   metoprolol succinate (TOPROL-XL) 50 mg, 2 times daily   pantoprazole (PROTONIX) 40 mg, Oral, 2 times daily   REPATHA 140 MG/ML SOSY 1 each, Daily   rosuvastatin (CRESTOR) 20 mg, Oral, Daily   sacubitril-valsartan (ENTRESTO) 97-103 MG 1 tablet, Oral, 2 times daily   silodosin (RAPAFLO) 8 mg, Every evening   spironolactone (ALDACTONE) 12.5 mg, Oral, Daily   Tresiba FlexTouch 100 Units, Every morning     ALLERGIES  He is allergic to ciprofloxacin, empagliflozin, levofloxacin, aricept [donepezil hcl], bactrim [sulfamethoxazole-trimethoprim], breztri aerosphere [budeson-glycopyrrol-formoterol], levofloxacin, and misc. sulfonamide containing compounds.  REVIEW OF SYSTEMS:  Review of Systems  Respiratory:  Positive for cough (chronic) and shortness of breath.      Rest of the pertinent review of systems is unremarkable except as mentioned above in HPI.  PHYSICAL EXAMINATION:    Onc Performance Status - 08/08/23 1016       ECOG Perf Status   ECOG Perf Status Restricted in physically strenuous activity but ambulatory and able to carry out work of a light or sedentary nature, e.g., light house work, office work      KPS SCALE   KPS % SCORE Normal activity with effort, some s/s of disease             Vitals:   08/08/23 1008  BP: 111/74  Pulse: (!) 102  Resp: 17  Temp: 97.6 F (36.4 C)  SpO2: 92%   Filed Weights   08/08/23 1008  Weight: 209 lb 4.8 oz (94.9 kg)    Physical Exam Constitutional:      General: He is not in acute distress.    Appearance: Normal appearance.  HENT:     Head: Normocephalic and atraumatic.  Eyes:     General: No scleral icterus.    Conjunctiva/sclera: Conjunctivae normal.  Cardiovascular:     Rate and Rhythm: Normal rate and regular rhythm.     Heart sounds: Normal heart sounds.  Pulmonary:     Effort: Pulmonary effort is normal.     Breath  sounds: Normal breath sounds.  Abdominal:     General: There is no distension.  Musculoskeletal:  Right lower leg: No edema.     Left lower leg: No edema.  Neurological:     General: No focal deficit present.     Mental Status: He is alert and oriented to person, place, and time.  Psychiatric:        Mood and Affect: Mood normal.        Behavior: Behavior normal.        Thought Content: Thought content normal.      LABORATORY DATA:   I have reviewed the data as listed.  Results for orders placed or performed in visit on 08/08/23  CBC with Differential (Cancer Center Only)  Result Value Ref Range   WBC Count 9.4 4.0 - 10.5 K/uL   RBC 4.93 4.22 - 5.81 MIL/uL   Hemoglobin 11.5 (L) 13.0 - 17.0 g/dL   HCT 16.1 (L) 09.6 - 04.5 %   MCV 77.3 (L) 80.0 - 100.0 fL   MCH 23.3 (L) 26.0 - 34.0 pg   MCHC 30.2 30.0 - 36.0 g/dL   RDW 40.9 (H) 81.1 - 91.4 %   Platelet Count 288 150 - 400 K/uL   nRBC 0.0 0.0 - 0.2 %   Neutrophils Relative % 70 %   Neutro Abs 6.6 1.7 - 7.7 K/uL   Lymphocytes Relative 17 %   Lymphs Abs 1.6 0.7 - 4.0 K/uL   Monocytes Relative 6 %   Monocytes Absolute 0.6 0.1 - 1.0 K/uL   Eosinophils Relative 6 %   Eosinophils Absolute 0.5 0.0 - 0.5 K/uL   Basophils Relative 0 %   Basophils Absolute 0.0 0.0 - 0.1 K/uL   Immature Granulocytes 1 %   Abs Immature Granulocytes 0.05 0.00 - 0.07 K/uL     RADIOGRAPHIC STUDIES:  I have personally reviewed the radiological images as listed and agreed with the findings in the report.  CT ANGIO AO+BIFEM W & OR WO CONTRAST Result Date: 08/01/2023 CLINICAL DATA:  73 year old male with history of bilateral popliteal artery aneurysms. EXAM: CT ANGIOGRAPHY OF ABDOMINAL AORTA WITH ILIOFEMORAL RUNOFF TECHNIQUE: Multidetector CT imaging of the abdomen, pelvis and lower extremities was performed using the standard protocol during bolus administration of intravenous contrast. Multiplanar CT image reconstructions and MIPs were obtained  to evaluate the vascular anatomy. RADIATION DOSE REDUCTION: This exam was performed according to the departmental dose-optimization program which includes automated exposure control, adjustment of the mA and/or kV according to patient size and/or use of iterative reconstruction technique. CONTRAST:  ISOVUE-370 IOPAMIDOL (ISOVUE-370) INJECTION 76% COMPARISON:  PET-CT from 11/04/2020 FINDINGS: VASCULAR Aorta: Normal caliber aorta without aneurysm, dissection, vasculitis or significant stenosis. Scattered atherosclerotic calcifications. Celiac: Patent without evidence of aneurysm, dissection, vasculitis or significant stenosis. SMA: Patent without evidence of aneurysm, dissection, vasculitis or significant stenosis. Renals: Single bilateral renal arteries are patent without evidence of aneurysm, dissection, vasculitis, fibromuscular dysplasia or significant stenosis. IMA: Patent without evidence of aneurysm, dissection, vasculitis or significant stenosis. RIGHT Lower Extremity Inflow: Common, internal and external iliac arteries are patent without evidence of aneurysm, dissection, vasculitis or significant stenosis. Outflow: Fusiform aneurysmal dilation of the proximal popliteal artery measuring up to 2.2 cm. Common, superficial and profunda femoral arteries are patent without evidence of aneurysm, dissection, vasculitis or significant stenosis. Runoff: Patent three vessel runoff to the ankle. LEFT Lower Extremity Inflow: Common, internal and external iliac arteries are patent without evidence of aneurysm, dissection, vasculitis or significant stenosis. Outflow: Fusiform aneurysmal dilation of the proximal popliteal artery measuring up to 2.6 cm. Common, superficial  and profunda femoral arteries are patent without evidence of aneurysm, dissection, vasculitis or significant stenosis. Runoff: Patent three vessel runoff to the ankle. Veins: No obvious venous abnormality within the limitations of this arterial phase  study. Review of the MIP images confirms the above findings. NON-VASCULAR Lower chest: No acute abnormality. Hepatobiliary: No focal liver abnormality is seen. No gallstones, gallbladder wall thickening, or biliary dilatation. Pancreas: Unremarkable. No pancreatic ductal dilatation or surrounding inflammatory changes. Spleen: Normal in size without focal abnormality. Adrenals/Urinary Tract: Adrenal glands are unremarkable. Multifocal scattered subcentimeter simple renal cysts, not requiring additional follow-up. Kidneys are normal, without renal calculi, focal lesion, or hydronephrosis. Bladder is unremarkable. Stomach/Bowel: Stomach is within normal limits. Appendix appears normal. Scattered diverticula in the transverse and descending colon without surrounding inflammatory changes. No evidence of bowel wall thickening, distention, or inflammatory changes. Lymphatic: No abdominopelvic lymphadenopathy. Reproductive: The prostate gland measures 5.8 cm in maximum axial dimension with scattered dystrophic calcifications. Other: Bilateral fat containing inguinal hernias.  No ascites. Musculoskeletal: No acute osseous abnormality. Postsurgical changes after L4-L5 PSIF without complicating features. IMPRESSION: VASCULAR 1. 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 Otherwise patent bilateral inflow, outflow, and 3 vessel runoff. 2.  Aortic Atherosclerosis (ICD10-I70.0). NON-VASCULAR 1. No acute abdominopelvic abnormality. 2. Diverticulosis. 3. Prostatomegaly. 4. Bilateral fat containing inguinal hernias without complicating features. Marliss Coots, MD [facility] Electronically Signed   By: Marliss Coots M.D.   On: 08/01/2023 12:59    Orders Placed This Encounter  Procedures   CBC with Differential (Cancer Center Only)    Standing Status:   Future    Number of Occurrences:    1    Expiration Date:   08/07/2024   CMP (Cancer Center only)    Standing Status:   Future    Number of Occurrences:   1    Expiration Date:   08/07/2024   D-dimer, quantitative    Standing Status:   Future    Number of Occurrences:   1    Expiration Date:   08/07/2024   Factor 5 leiden    Standing Status:   Future    Number of Occurrences:   1    Expiration Date:   08/07/2024   Prothrombin gene mutation    Standing Status:   Future    Number of Occurrences:   1    Expiration Date:   08/07/2024   Cardiolipin antibodies, IgG, IgM, IgA    Standing Status:   Future    Number of Occurrences:   1    Expiration Date:   08/07/2024   Beta-2-glycoprotein i abs, IgG/M/A    Standing Status:   Future    Number of Occurrences:   1    Expiration Date:   08/07/2024   Lupus anticoagulant panel    Standing Status:   Future    Number of Occurrences:   1    Expiration Date:   08/07/2024    Future Appointments  Date Time Provider Department Center  08/10/2023  8:40 AM Daria Pastures, MD [facility]  08/17/2023 11:15 AM Meryl Crutch, MD [facility] None  08/29/2023  9:30 AM Dohmeier, Porfirio Mylar, MD [facility] None  10/05/2023  9:00 AM Hunsucker, Lesia Sago, MD [facility] None  01/04/2024 10:00 AM [facility] None  01/04/2024 10:30 AM Dailan Pfalzgraf, Archie Patten, MD [facility] None    I spent a total  of 55 minutes during this encounter with the patient including review of chart and various tests results, discussions about plan of care and coordination of care plan.  This document was completed utilizing speech recognition software. Grammatical errors, random word insertions, pronoun errors, and incomplete sentences are an occasional consequence of this system due to software limitations, ambient noise, and hardware issues. Any formal questions or concerns about the content, text or information contained within the body of this dictation should be directly addressed to the provider for clarification.

## 2023-08-08 NOTE — Progress Notes (Unsigned)
 Patient ID: Donald [name], male   DOB: 05/08/1951, 73 y.o.   MRN: [id number]  Reason for Consult: No chief complaint on file.   Referred by Chilton Greathouse, MD  Subjective:     HPI  Donald [name] is a 73 y.o. male presenting for follow-up of she was incidentally identified popliteal artery aneurysms.  He is planned for a CTA short interval follow-up. He continues to be asymptomatic and denies claudication, rest pain or nonhealing wounds.  Past Medical History:  Diagnosis Date   Allergic rhinitis, seasonal    Asthma    Bronchitis    CHF (congestive heart failure) (HCC)    Chronic kidney disease, stage I    Cognitive impairment    Effects Short Term Memory   collar bone    dislocation left side 05/2014   Degenerative joint disease of knee    bilateral   Diabetes mellitus    Type 2   Dysrhythmia    PVCs   Fatty liver    GERD (gastroesophageal reflux disease)    Headache    Heachaces r/t eyes   Heart murmur    history of   History of acute prostatitis    History of hematuria    History of hiatal hernia    H/O   Hyperlipidemia    Hypertension    IgA nephropathy    OSA on CPAP    Pneumonia    Sinusitis    Family History  Problem Relation Age of Onset   Coronary artery disease Father    Diabetes type II Father    Heart attack Father    Past Surgical History:  Procedure Laterality Date   BIOPSY  05/17/2020   Procedure: BIOPSY;  Surgeon: Vida Rigger, MD;  Location: [facility];  Service: Endoscopy;;   CARDIAC CATHETERIZATION  09/29/2011   normal   CATARACT EXTRACTION Bilateral    CERVICAL SPINE SURGERY     COLONOSCOPY WITH PROPOFOL N/A 05/17/2020   Procedure: COLONOSCOPY WITH PROPOFOL;  Surgeon: Vida Rigger, MD;  Location: [facility];  Service: Endoscopy;  Laterality: N/A;   deviated septum     1990s   ESOPHAGOGASTRODUODENOSCOPY (EGD) WITH PROPOFOL N/A 05/17/2020   Procedure: ESOPHAGOGASTRODUODENOSCOPY (EGD) WITH PROPOFOL;  Surgeon: Vida Rigger,  MD;  Location: [facility];  Service: Endoscopy;  Laterality: N/A;   HEMOSTASIS CLIP PLACEMENT  05/17/2020   Procedure: HEMOSTASIS CLIP PLACEMENT;  Surgeon: Vida Rigger, MD;  Location: [facility];  Service: Endoscopy;;   HERNIA REPAIR     LEFT HEART CATHETERIZATION WITH CORONARY ANGIOGRAM N/A 09/29/2011   Procedure: LEFT HEART CATHETERIZATION WITH CORONARY ANGIOGRAM;  Surgeon: Thurmon Fair, MD;  Location: [facility];  Service: Cardiovascular;  Laterality: N/A;   NM MYOVIEW LTD  07/08/2011   mild LV dilatation,mild septal hypokinesia   POLYPECTOMY  05/17/2020   Procedure: POLYPECTOMY;  Surgeon: Vida Rigger, MD;  Location: [facility];  Service: Endoscopy;;   RIGHT HEART CATH N/A 04/17/2023   Procedure: RIGHT HEART CATH;  Surgeon: Marykay Lex, MD;  Location: [facility];  Service: Cardiovascular;  Laterality: N/A;   TONSILLECTOMY     US ECHOCARDIOGRAPHY  09/07/2011   EF 35-45%,mod.ant hypokinesis,boderline LA & RA enlargement,trace MR,TR & PI    Short Social History:  Social History   Tobacco Use   Smoking status: Never   Smokeless tobacco: Never   Tobacco comments:    Second hand smoke for 26 years per pt  Substance Use Topics   Alcohol use:  No    Alcohol/week: 0.0 standard drinks of alcohol    Allergies  Allergen Reactions   Ciprofloxacin     Tendons problem in shoulder Other reaction(s): Arthralgias (intolerance), Myalgias (intolerance)   Empagliflozin Other (See Comments)    Dehydration, uti.   Levofloxacin     Other reaction(s): Arthralgias (intolerance), Myalgias (intolerance)   Aricept [Donepezil Hcl] Nausea Only   Bactrim [Sulfamethoxazole-Trimethoprim] Hives   Breztri Aerosphere [Budeson-Glycopyrrol-Formoterol] Other (See Comments)    Patient and wife are unsure what happened, however they remember he had a bad reaction   Levofloxacin Other (See Comments)    Muscle tighten up in left and right calf   Misc. Sulfonamide Containing Compounds Rash     Current Outpatient Medications  Medication Sig Dispense Refill   apixaban (ELIQUIS) 5 MG TABS tablet Take 2 tablets (10mg ) by mouth twice daily for 7 days, then switch to 1 tablet (5mg ) by mouth twice daily. (Patient not taking: Reported on 08/03/2023) 90 tablet 3   Continuous Blood Gluc Sensor (FREESTYLE LIBRE 14 DAY SENSOR) MISC Inject 1 Application into the skin every 14 (fourteen) days. (Patient not taking: Reported on 08/03/2023)     DULoxetine (CYMBALTA) 30 MG capsule Take 30 mg by mouth in the morning. (Patient not taking: Reported on 08/03/2023)     finasteride (PROSCAR) 5 MG tablet Take 5 mg by mouth in the morning. (Patient not taking: Reported on 08/03/2023)     fluticasone (FLONASE) 50 MCG/ACT nasal spray Place 1 spray into both nostrils daily. (Patient not taking: Reported on 08/03/2023)     furosemide (LASIX) 20 MG tablet Take 10 mg by mouth daily. (Patient not taking: Reported on 08/03/2023)     HYDROcodone-acetaminophen (NORCO/VICODIN) 5-325 MG tablet Take 1-2 tablets by mouth every 4 (four) hours as needed for moderate pain ((score 4 to 6)). (Patient not taking: Reported on 08/03/2023) 30 tablet 0   insulin lispro (HUMALOG KWIKPEN) 200 UNIT/ML KwikPen Inject 30-130 Units into the skin in the morning, at noon, and at bedtime. Sliding scale If blood sugar is over 140 (Patient not taking: Reported on 08/03/2023)     ipratropium (ATROVENT HFA) 17 MCG/ACT inhaler Inhale 2 puffs into the lungs every 6 (six) hours. (Patient not taking: Reported on 07/03/2023)     loratadine (CLARITIN) 10 MG tablet Take 10 mg by mouth at bedtime. (Patient not taking: Reported on 08/03/2023)     memantine (NAMENDA) 10 MG tablet Take 1 tablet (10 mg total) by mouth 2 (two) times daily. (Patient not taking: Reported on 08/03/2023) 180 tablet 3   metFORMIN (GLUCOPHAGE) 1000 MG tablet Take 1 tablet (1,000 mg total) by mouth 2 (two) times daily with a meal. Restart on 8/22 (Patient not taking: Reported on 08/03/2023)      metoprolol succinate (TOPROL-XL) 50 MG 24 hr tablet Take 50 mg by mouth 2 (two) times daily. Take with or immediately following a meal. (Patient not taking: Reported on 08/03/2023)     pantoprazole (PROTONIX) 40 MG tablet Take 1 tablet (40 mg total) by mouth 2 (two) times daily. (Patient not taking: Reported on 08/03/2023) 180 tablet 0   REPATHA 140 MG/ML SOSY Inject 1 each into the skin daily. (Patient not taking: Reported on 07/03/2023)     rosuvastatin (CRESTOR) 20 MG tablet TAKE ONE TABLET BY MOUTH ONE TIME DAILY (Patient not taking: Reported on 08/03/2023) 90 tablet 3   sacubitril-valsartan (ENTRESTO) 97-103 MG Take 1 tablet by mouth 2 (two) times daily. (Patient not taking: Reported on  08/03/2023) 90 tablet 1   silodosin (RAPAFLO) 8 MG CAPS capsule Take 8 mg by mouth every evening. (Patient not taking: Reported on 08/03/2023)     spironolactone (ALDACTONE) 25 MG tablet Take one-half tablet by mouth daily 45 tablet 3   TRESIBA FLEXTOUCH 200 UNIT/ML FlexTouch Pen Inject 100 Units into the skin in the morning. (Patient not taking: Reported on 08/03/2023)     No current facility-administered medications for this visit.    REVIEW OF SYSTEMS  Positive for ***  All other systems were reviewed and are negative     Objective:  Objective   There were no vitals filed for this visit. There is no height or weight on file to calculate BMI.  Physical Exam General: no acute distress Cardiac: hemodynamically stable Pulm: normal work of breathing Neuro: alert, no focal deficit Extremities: 1+ edema from ankles to mid shin bilaterally, no cyanosis or wounds  Vascular:   Right: Palpable DP  Left: Palpable DP   Data: CTA independently reviewed No evidence of aortic or femoral aneurysms. Right popliteal artery aneurysm measuring approximately 2.1 with about 50% mural thrombus Left popliteal artery aneurysm measuring about 2.5 with about 50% mural thrombus.  Echo reviewed EF 40 to 45%, grade 1  diastolic dysfunction, RV function normal, no valvular issues     Assessment/Plan:     Donald [name] is a 73 y.o. 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    Recommendations to optimize cardiovascular risk: Abstinence from all tobacco products. Blood glucose control with goal A1c < 7%. Blood pressure control with goal blood pressure < 140/90 mmHg. Lipid reduction therapy with goal LDL-C <100 mg/dL  Aspirin 81mg  PO QD.  Atorvastatin 40-80mg  PO QD (or other "high intensity" statin therapy).     Daria Pastures MD [facility]

## 2023-08-08 NOTE — Assessment & Plan Note (Signed)
 Colonoscopy in January 2022 revealed hemorrhoids, diverticulosis, and a benign polyp. Recommended repeat in three years. Current anticoagulation therapy affects timing of colonoscopy. Ideal to delay until off anticoagulation, but if necessary, pause therapy for 3-5 days prior to procedure after June. - Delay colonoscopy until at least September, after reassessment of anticoagulation therapy. - If necessary, plan to pause anticoagulation therapy for 3-5 days prior to the procedure, but ideally wait until off anticoagulation.

## 2023-08-08 NOTE — Assessment & Plan Note (Signed)
 IgA nephropathy monitored by primary care physician. No significant change in renal function. Potential increased risk of clotting due to protein imbalances. - Monitor renal function through regular blood tests. - Consider nephrology referral if significant changes in renal function occur.

## 2023-08-08 NOTE — Telephone Encounter (Signed)
 Appt has been scheduled.

## 2023-08-09 ENCOUNTER — Ambulatory Visit (HOSPITAL_COMMUNITY)
Admission: RE | Admit: 2023-08-09 | Discharge: 2023-08-09 | Disposition: A | Discharge status: Home / Self Care | Source: Ambulatory Visit | Attending: Vascular Surgery | Admitting: Vascular Surgery

## 2023-08-09 ENCOUNTER — Other Ambulatory Visit: Payer: Self-pay | Admitting: *Deleted

## 2023-08-09 DIAGNOSIS — I724 Aneurysm of artery of lower extremity: Secondary | ICD-10-CM | POA: Insufficient documentation

## 2023-08-09 LAB — LUPUS ANTICOAGULANT PANEL
DRVVT: 68.6 s — ABNORMAL HIGH (ref 0.0–47.0)
PTT Lupus Anticoagulant: 34.1 s (ref 0.0–43.5)

## 2023-08-09 LAB — BETA-2-GLYCOPROTEIN I ABS, IGG/M/A
Beta-2 Glyco I IgG: <9 GPI IgG units (ref 0–20)
Beta-2-Glycoprotein I IgA: <9 GPI IgA units (ref 0–25)
Beta-2-Glycoprotein I IgM: <9 GPI IgM units (ref 0–32)

## 2023-08-09 LAB — DRVVT MIX: dRVVT Mix: 53 s — ABNORMAL HIGH (ref 0.0–40.4)

## 2023-08-09 LAB — DRVVT CONFIRM: dRVVT Confirm: 1.1 ratio (ref 0.8–1.2)

## 2023-08-10 ENCOUNTER — Telehealth: Payer: Self-pay

## 2023-08-10 ENCOUNTER — Encounter: Payer: Self-pay | Admitting: Vascular Surgery

## 2023-08-10 ENCOUNTER — Ambulatory Visit: Admitting: Vascular Surgery

## 2023-08-10 VITALS — BP 112/71 | HR 88 | Temp 97.7°F | Ht 68.0 in | Wt 208.6 lb

## 2023-08-10 DIAGNOSIS — I724 Aneurysm of artery of lower extremity: Secondary | ICD-10-CM | POA: Diagnosis not present

## 2023-08-10 DIAGNOSIS — I2699 Other pulmonary embolism without acute cor pulmonale: Secondary | ICD-10-CM | POA: Diagnosis not present

## 2023-08-10 LAB — CARDIOLIPIN ANTIBODIES, IGG, IGM, IGA
Anticardiolipin IgA: <9 U/mL (ref 0–11)
Anticardiolipin IgG: <9 GPL U/mL (ref 0–14)
Anticardiolipin IgM: <9 [MPL'U]/mL (ref 0–12)

## 2023-08-10 NOTE — Telephone Encounter (Signed)
 Attempted to call for surgery scheduling. Donald [name]

## 2023-08-13 ENCOUNTER — Telehealth: Payer: Self-pay

## 2023-08-13 LAB — PROTHROMBIN GENE MUTATION

## 2023-08-13 LAB — FACTOR 5 LEIDEN

## 2023-08-13 NOTE — Telephone Encounter (Signed)
 Attempted to call for surgery scheduling. LVM

## 2023-08-14 ENCOUNTER — Telehealth: Payer: Self-pay

## 2023-08-14 DIAGNOSIS — I509 Heart failure, unspecified: Secondary | ICD-10-CM | POA: Diagnosis not present

## 2023-08-14 DIAGNOSIS — R0609 Other forms of dyspnea: Secondary | ICD-10-CM | POA: Diagnosis not present

## 2023-08-14 NOTE — Telephone Encounter (Signed)
 Called pt and his wife to schedule his surgery. I left VM for his wife to return our call and then spoke to pt on his phone. Pt states his wife is the one who would schedule this but she is busy with work and will call us soon to get this scheduled.

## 2023-08-15 ENCOUNTER — Telehealth: Payer: Self-pay

## 2023-08-15 NOTE — Telephone Encounter (Signed)
 Spoke to patient's wife, who requested 5/19 at a later time.  This nurse will watch schedule and call when Dr. Konrad Felix schedule for [facility] fills.

## 2023-08-17 ENCOUNTER — Encounter: Payer: Self-pay | Admitting: Oncology

## 2023-08-17 ENCOUNTER — Inpatient Hospital Stay: Admitting: Oncology

## 2023-08-17 DIAGNOSIS — I724 Aneurysm of artery of lower extremity: Secondary | ICD-10-CM | POA: Diagnosis not present

## 2023-08-17 DIAGNOSIS — I82492 Acute embolism and thrombosis of other specified deep vein of left lower extremity: Secondary | ICD-10-CM

## 2023-08-17 DIAGNOSIS — D509 Iron deficiency anemia, unspecified: Secondary | ICD-10-CM

## 2023-08-17 DIAGNOSIS — I2699 Other pulmonary embolism without acute cor pulmonale: Secondary | ICD-10-CM | POA: Diagnosis not present

## 2023-08-17 NOTE — Progress Notes (Signed)
 [facility]  HEMATOLOGY-ONCOLOGY ELECTRONIC VISIT PROGRESS NOTE  PATIENT NAME: Donald [name]   MR#: [id number] DOB: 1951-02-07  DATE OF SERVICE: 08/17/2023  Patient Care Team: Chilton Greathouse, MD as PCP - General (Internal Medicine) Croitoru, Rachelle Hora, MD as PCP - Cardiology (Cardiology) Sol Passer, NP as Nurse Practitioner (Internal Medicine)  I connected with the patient via telephone conference and verified that I am speaking with the correct person using two identifiers. The patient's location is at home and I am providing care from the [facility].  I discussed the limitations, risks, security and privacy concerns of performing an evaluation and management service by e-visits and the availability of in person appointments.  I also discussed with the patient that there may be a patient responsible charge related to this service. The patient expressed understanding and agreed to proceed.   ASSESSMENT & PLAN:   Donald [name] is a 73 y.o. gentleman with a past medical history of hypertension, diabetes mellitus, dyslipidemia, IgA nephropathy, OSA on CPAP, CHF, CKD Stage 3b, was referred to our service for evaluation of possible hypercoagulable state, given recent bilateral pulmonary emboli and DVT of the left lower extremity, diagnosed on 07/03/2023.   Bilateral pulmonary embolism (HCC) Worsening dyspnea led to diagnosis of bilateral pulmonary emboli and left leg DVT on 07/02/2023.   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   Significant symptom improvement reported since starting anticoagulation, resumed gym activities.   Current treatment involves anticoagulation therapy with Eliquis 5 mg twice daily.    On his initial consultation with [place] on 08/08/2023, we obtained thrombophilia workup with factor V Leiden  mutation, prothrombin gene mutation, beta-2 glycoprotein antibodies, anticardiolipin antibodies, lupus anticoagulant testing and it was all unremarkable.  D-dimer was normal at 0.47.  Rest of the hypercoagulable workup was deferred as it can be falsely abnormal given recent thromboembolism.  This will be obtained on return visit.   - Continue anticoagulation therapy with 5 mg twice daily for six months.  We may potentially extend it to 1 year depending on results of above workup.    - Plan follow-up blood tests in five months to reassess clotting risk and determine the duration of anticoagulation therapy.  Acute deep vein thrombosis (DVT) of left lower extremity (HCC) Continue anticoagulation as mentioned above  Microcytic anemia He has mild anemia with a hemoglobin level of 11.5, slightly improved from previous labs. No reported blood loss or gastrointestinal bleeding. Further evaluation is planned with the primary care physician. - Request primary care physician to check CBC and iron studies at the next visit  Will obtain additional workup on return visit with [place].  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 Stent placement is recommended but not urgent. The procedure requires temporary cessation of blood thinners for 2-3 days, acceptable after at least 3 months of anticoagulation therapy. The vascular doctor concurs with this plan. - Schedule stent placement after at least 3 months of anticoagulation therapy - Temporarily stop blood thinners for 2-3 days for the procedure   I discussed the assessment and treatment plan with the patient. The patient was provided an opportunity to ask questions and all were answered. The patient agreed with the plan and demonstrated an understanding of the instructions. The patient was advised to call back or seek an in-person evaluation if the symptoms worsen  or if the condition fails to improve as anticipated.     I spent 21 minutes over the phone with the patient reviewing test results, discuss management and coordination/planning of care.  Meryl Crutch, MD 08/17/2023 2:44 PM  [facility] - A DEPT OF [facility] [address] [place] [address] Dept: [phone] Dept Fax: [phone]   INTERVAL HISTORY:  Please see above for problem oriented charting.  The purpose of today's discussion is to explain recent lab results and to formulate plan of care.  Discussed the use of AI scribe software for clinical note transcription with the patient, who gave verbal consent to proceed.  History of Present Illness  He is currently on Eliquis, taking it twice daily, as part of his anticoagulation therapy.  A CT scan conducted by a vascular doctor identified an aneurysm behind his knee.  He was noted to have mild anemia during his last visit, with a hemoglobin level of 11.5. There have been no reported symptoms of blood loss, such as blood in the stool.    SUMMARY OF HEMATOLOGY HISTORY:  He presented to the ED on 07/02/2023 with worsening shortness of breath.  He was found to have bilateral pulmonary emboli, right greater than left on CT angiogram of the chest.  There was no evidence of right heart strain.  Ultrasound Doppler showed DVT in the left lower extremity in the posterior tibial and left peroneal veins.  PE/DVT was felt likely to be provoked by sedentary lifestyle.  He was started on Eliquis twice daily with plan to continue at least for 6 months.  He was referred to [place] for further evaluation and for recommendations regarding duration of anticoagulation.   He is accompanied by Sedalia Muta, his wife.   He was not on bed rest prior to the diagnosis of DVT/PE but had reduced activity following back surgery in the spring of the previous year. His breathing difficulties began around Thanksgiving, and he was unable to exercise due to  shortness of breath. He has been using oxygen at night and a CPAP machine due to low oxygen levels during sleep. Since starting blood thinners, his symptoms have improved significantly, and he has returned to the gym. No current leg pain, swelling, or redness. No blood in stools or black stools.   He has a history of IgA nephropathy, which was diagnosed after blood was found in his urine. He has not seen a nephrologist recently but has been monitored by his primary care physician. He has not received treatment for nephropathy, and his kidney function numbers have remained stable.   His social history includes being retired, spending time at home, and engaging in activities such as reading and using the computer. He attempts to work out three times a week and sees a Systems analyst twice a week. He has difficulty walking due to foot pain.   He denies fever, cough, diarrhea, or other infectious symptoms.  He denies epistaxis, bloody stool, melena, hematuria, bruising or other bleeding symptoms. He also denies unintentional weight loss, or other constitutional symptoms.  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    Significant symptom improvement reported since starting anticoagulation, resumed gym activities.    Current treatment involves anticoagulation therapy with Eliquis 5 mg twice daily.    On his initial consultation with [place] on 08/08/2023, we obtained  thrombophilia workup with factor V Leiden mutation, prothrombin gene mutation, beta-2 glycoprotein antibodies, anticardiolipin antibodies, lupus anticoagulant testing and it was all unremarkable.  D-dimer was normal at 0.47.  Rest of the hypercoagulable workup was deferred as it can be falsely abnormal given recent thromboembolism.  This will be obtained on return visit.   - Continue anticoagulation  therapy with 5 mg twice daily for six months.  We may potentially extend it to 1 year depending on results of above workup.    - Plan follow-up blood tests in five months to reassess clotting risk and determine the duration of anticoagulation therapy.    REVIEW OF SYSTEMS:    Review of Systems - Oncology  All other pertinent systems were reviewed with the patient and are negative.  I have reviewed the past medical history, past surgical history, social history and family history with the patient and they are unchanged from previous note.  ALLERGIES:  He is allergic to ciprofloxacin, empagliflozin, levofloxacin, aricept [donepezil hcl], bactrim [sulfamethoxazole-trimethoprim], breztri aerosphere [budeson-glycopyrrol-formoterol], levofloxacin, and misc. sulfonamide containing compounds.  MEDICATIONS:  Current Outpatient Medications  Medication Sig Dispense Refill   apixaban (ELIQUIS) 5 MG TABS tablet Take 2 tablets (10mg ) by mouth twice daily for 7 days, then switch to 1 tablet (5mg ) by mouth twice daily. 90 tablet 3   Continuous Blood Gluc Sensor (FREESTYLE LIBRE 14 DAY SENSOR) MISC Inject 1 Application into the skin every 14 (fourteen) days.     DULoxetine (CYMBALTA) 30 MG capsule Take 30 mg by mouth in the morning.     finasteride (PROSCAR) 5 MG tablet Take 5 mg by mouth in the morning.     fluticasone (FLONASE) 50 MCG/ACT nasal spray Place 1 spray into both nostrils daily.     furosemide (LASIX) 20 MG tablet Take 10 mg by mouth daily.     HYDROcodone-acetaminophen (NORCO/VICODIN) 5-325 MG tablet Take 1-2 tablets by mouth every 4 (four) hours as needed for moderate pain ((score 4 to 6)). 30 tablet 0   insulin lispro (HUMALOG KWIKPEN) 200 UNIT/ML KwikPen Inject 30-130 Units into the skin in the morning, at noon, and at bedtime. Sliding scale If blood sugar is over 140     loratadine (CLARITIN) 10 MG tablet Take 10 mg by mouth at bedtime.     memantine (NAMENDA) 10 MG tablet Take 1 tablet  (10 mg total) by mouth 2 (two) times daily. 180 tablet 3   metFORMIN (GLUCOPHAGE) 1000 MG tablet Take 1 tablet (1,000 mg total) by mouth 2 (two) times daily with a meal. Restart on 8/22     metoprolol succinate (TOPROL-XL) 50 MG 24 hr tablet Take 50 mg by mouth 2 (two) times daily. Take with or immediately following a meal.     pantoprazole (PROTONIX) 40 MG tablet Take 1 tablet (40 mg total) by mouth 2 (two) times daily. 180 tablet 0   rosuvastatin (CRESTOR) 20 MG tablet TAKE ONE TABLET BY MOUTH ONE TIME DAILY 90 tablet 3   sacubitril-valsartan (ENTRESTO) 97-103 MG TAKE ONE TABLET BY MOUTH TWICE DAILY 180 tablet 3   silodosin (RAPAFLO) 8 MG CAPS capsule Take 8 mg by mouth every evening.     spironolactone (ALDACTONE) 25 MG tablet Take one-half tablet by mouth daily 45 tablet 3   TRESIBA FLEXTOUCH 200 UNIT/ML FlexTouch Pen Inject 100 Units into the skin in the morning.     No current facility-administered medications for this visit.    PHYSICAL EXAMINATION:    Onc Performance Status -  08/17/23 1400       ECOG Perf Status   ECOG Perf Status Restricted in physically strenuous activity but ambulatory and able to carry out work of a light or sedentary nature, e.g., light house work, office work      KPS SCALE   KPS % SCORE Able to carry on normal activity, minor s/s of disease             LABORATORY DATA:   I have reviewed the data as listed.  Recent Results (from the past 2160 hours)  Basic metabolic panel     Status: Abnormal   Collection Time: 06/01/23  9:11 AM  Result Value Ref Range   Glucose 193 (H) 70 - 99 mg/dL   BUN 77 (HH) 8 - 27 mg/dL   Creatinine, Ser 1.61 (H) 0.76 - 1.27 mg/dL   eGFR 34 (L) >09 UE/AVW/0.98   BUN/Creatinine Ratio 37 (H) 10 - 24   Sodium 141 134 - 144 mmol/L   Potassium 3.9 3.5 - 5.2 mmol/L   Chloride 97 96 - 106 mmol/L   CO2 21 20 - 29 mmol/L   Calcium 9.8 8.6 - 10.2 mg/dL  Brain natriuretic peptide     Status: None   Collection Time: 06/01/23   9:11 AM  Result Value Ref Range   BNP <2.5 0.0 - 100.0 pg/mL    Comment: Siemens ADVIA Centaur XP methodology  Basic metabolic panel     Status: Abnormal   Collection Time: 06/11/23  2:42 PM  Result Value Ref Range   Glucose 220 (H) 70 - 99 mg/dL   BUN 58 (H) 8 - 27 mg/dL   Creatinine, Ser 1.19 (H) 0.76 - 1.27 mg/dL   eGFR 50 (L) >14 NW/GNF/6.21   BUN/Creatinine Ratio 39 (H) 10 - 24   Sodium 141 134 - 144 mmol/L   Potassium 4.5 3.5 - 5.2 mmol/L   Chloride 102 96 - 106 mmol/L   CO2 21 20 - 29 mmol/L   Calcium 9.7 8.6 - 10.2 mg/dL  CBC     Status: Abnormal   Collection Time: 06/11/23  2:42 PM  Result Value Ref Range   WBC 8.3 3.4 - 10.8 x10E3/uL   RBC 5.16 4.14 - 5.80 x10E6/uL   Hemoglobin 12.2 (L) 13.0 - 17.7 g/dL   Hematocrit 30.8 65.7 - 51.0 %   MCV 76 (L) 79 - 97 fL   MCH 23.6 (L) 26.6 - 33.0 pg   MCHC 31.0 (L) 31.5 - 35.7 g/dL   RDW 84.6 (H) 96.2 - 95.2 %   Platelets 276 150 - 450 x10E3/uL  Resp panel by RT-PCR (RSV, Flu A&B, Covid) Anterior Nasal Swab     Status: None   Collection Time: 07/02/23  5:49 PM   Specimen: Anterior Nasal Swab  Result Value Ref Range   SARS Coronavirus 2 by RT PCR NEGATIVE NEGATIVE   Influenza A by PCR NEGATIVE NEGATIVE   Influenza B by PCR NEGATIVE NEGATIVE    Comment: (NOTE) The Xpert Xpress SARS-CoV-2/FLU/RSV plus assay is intended as an aid in the diagnosis of influenza from Nasopharyngeal swab specimens and should not be used as a sole basis for treatment. Nasal washings and aspirates are unacceptable for Xpert Xpress SARS-CoV-2/FLU/RSV testing.  Fact Sheet for Patients: BloggerCourse.com  Fact Sheet for Healthcare Providers: SeriousBroker.it  This test is not yet approved or cleared by the [place] FDA and has been authorized for detection and/or diagnosis of SARS-CoV-2 by FDA under an Emergency Use  Authorization (EUA). This EUA will remain in effect (meaning this test can be  used) for the duration of the COVID-19 declaration under Section 564(b)(1) of the Act, 21 U.S.C. section 360bbb-3(b)(1), unless the authorization is terminated or revoked.     Resp Syncytial Virus by PCR NEGATIVE NEGATIVE    Comment: (NOTE) Fact Sheet for Patients: BloggerCourse.com  Fact Sheet for Healthcare Providers: SeriousBroker.it  This test is not yet approved or cleared by the [place] FDA and has been authorized for detection and/or diagnosis of SARS-CoV-2 by FDA under an Emergency Use Authorization (EUA). This EUA will remain in effect (meaning this test can be used) for the duration of the COVID-19 declaration under Section 564(b)(1) of the Act, 21 U.S.C. section 360bbb-3(b)(1), unless the authorization is terminated or revoked.  Performed at [facility], [address], [place], [place] [address]   Comprehensive metabolic panel     Status: Abnormal   Collection Time: 07/02/23  5:49 PM  Result Value Ref Range   Sodium 136 135 - 145 mmol/L   Potassium 3.9 3.5 - 5.1 mmol/L   Chloride 101 98 - 111 mmol/L   CO2 22 22 - 32 mmol/L   Glucose, Bld 244 (H) 70 - 99 mg/dL    Comment: Glucose reference range applies only to samples taken after fasting for at least 8 hours.   BUN 39 (H) 8 - 23 mg/dL   Creatinine, Ser 9.14 (H) 0.61 - 1.24 mg/dL   Calcium 9.3 8.9 - 78.2 mg/dL   Total Protein 7.1 6.5 - 8.1 g/dL   Albumin 3.7 3.5 - 5.0 g/dL   AST 25 15 - 41 U/L   ALT 24 0 - 44 U/L   Alkaline Phosphatase 44 38 - 126 U/L   Total Bilirubin 0.2 0.0 - 1.2 mg/dL   GFR, Estimated >95 >62 mL/min    Comment: (NOTE) Calculated using the CKD-EPI Creatinine Equation (2021)    Anion gap 13 5 - 15    Comment: Performed at [facility], [address], [place], [place] [address]  CBC with Differential/Platelet     Status: Abnormal   Collection Time: 07/02/23  5:49 PM  Result Value Ref Range   WBC 11.1 (H) 4.0 - 10.5  K/uL   RBC 4.93 4.22 - 5.81 MIL/uL   Hemoglobin 11.9 (L) 13.0 - 17.0 g/dL   HCT 57.8 (L) 46.9 - 62.9 %   MCV 78.5 (L) 80.0 - 100.0 fL   MCH 24.1 (L) 26.0 - 34.0 pg   MCHC 30.7 30.0 - 36.0 g/dL   RDW 52.8 (H) 41.3 - 24.4 %   Platelets 268 150 - 400 K/uL   nRBC 0.0 0.0 - 0.2 %   Neutrophils Relative % 62 %   Neutro Abs 6.9 1.7 - 7.7 K/uL   Lymphocytes Relative 25 %   Lymphs Abs 2.7 0.7 - 4.0 K/uL   Monocytes Relative 7 %   Monocytes Absolute 0.8 0.1 - 1.0 K/uL   Eosinophils Relative 5 %   Eosinophils Absolute 0.5 0.0 - 0.5 K/uL   Basophils Relative 1 %   Basophils Absolute 0.1 0.0 - 0.1 K/uL   Immature Granulocytes 0 %   Abs Immature Granulocytes 0.03 0.00 - 0.07 K/uL    Comment: Performed at [facility], [address] [place], [place] [address]  Brain natriuretic peptide     Status: None   Collection Time: 07/02/23  5:49 PM  Result Value Ref Range  B Natriuretic Peptide 40.3 0.0 - 100.0 pg/mL    Comment: Performed at [facility], [address], [place], [place] [address]  Magnesium     Status: None   Collection Time: 07/02/23  5:49 PM  Result Value Ref Range   Magnesium 2.0 1.7 - 2.4 mg/dL    Comment: Performed at [facility] [place] [facility], [address], [place], [place] [address]  Blood gas, venous     Status: None   Collection Time: 07/02/23  6:27 PM  Result Value Ref Range   pH, Ven 7.36 7.25 - 7.43   pCO2, Ven 47 44 - 60 mmHg   pO2, Ven 40 32 - 45 mmHg   Bicarbonate 26.6 20.0 - 28.0 mmol/L   Acid-Base Excess 0.6 0.0 - 2.0 mmol/L   O2 Saturation 72 %   Patient temperature 36.8    Collection site VEIN     Comment: Performed at [facility], [address], [place], [place] [address]  Troponin I (High Sensitivity)     Status: Abnormal   Collection Time: 07/02/23 11:14 PM  Result Value Ref Range   Troponin I (High Sensitivity) 21 (H) <18 ng/L    Comment: (NOTE) Elevated high sensitivity troponin I (hsTnI) values and significant  changes across  serial measurements may suggest ACS but many other  chronic and acute conditions are known to elevate hsTnI results.  Refer to the "Links" section for chest pain algorithms and additional  guidance. Performed at [facility], [address] [place], [place] [address]   Troponin I (High Sensitivity)     Status: None   Collection Time: 07/03/23 12:47 AM  Result Value Ref Range   Troponin I (High Sensitivity) 16 <18 ng/L    Comment: (NOTE) Elevated high sensitivity troponin I (hsTnI) values and significant  changes across serial measurements may suggest ACS but many other  chronic and acute conditions are known to elevate hsTnI results.  Refer to the "Links" section for chest pain algorithms and additional  guidance. Performed at [facility], [address], [place], [place] [address]   CBC     Status: Abnormal   Collection Time: 07/03/23  5:41 AM  Result Value Ref Range   WBC 9.0 4.0 - 10.5 K/uL   RBC 4.61 4.22 - 5.81 MIL/uL   Hemoglobin 11.2 (L) 13.0 - 17.0 g/dL   HCT 02.7 (L) 25.3 - 66.4 %   MCV 77.2 (L) 80.0 - 100.0 fL   MCH 24.3 (L) 26.0 - 34.0 pg   MCHC 31.5 30.0 - 36.0 g/dL   RDW 40.3 (H) 47.4 - 25.9 %   Platelets 231 150 - 400 K/uL   nRBC 0.0 0.0 - 0.2 %    Comment: Performed at [facility], [address], [place], [place] [address]  Hemoglobin A1c     Status: Abnormal   Collection Time: 07/03/23  5:41 AM  Result Value Ref Range   Hgb A1c MFr Bld 9.0 (H) 4.8 - 5.6 %    Comment: (NOTE)         Prediabetes: 5.7 - 6.4         Diabetes: >6.4         Glycemic control for adults with diabetes: <7.0    Mean Plasma Glucose 212 mg/dL    Comment: (NOTE) Performed At: [facility] [address] [place], [place] [id number] Jolene Schimke MD OA:[id number]   Basic metabolic panel  Status: Abnormal   Collection Time: 07/03/23  5:41 AM  Result Value Ref Range   Sodium 136 135 - 145 mmol/L   Potassium 4.1 3.5 - 5.1 mmol/L   Chloride 106 98 -  111 mmol/L   CO2 23 22 - 32 mmol/L   Glucose, Bld 346 (H) 70 - 99 mg/dL    Comment: Glucose reference range applies only to samples taken after fasting for at least 8 hours.   BUN 34 (H) 8 - 23 mg/dL   Creatinine, Ser 4.09 (H) 0.61 - 1.24 mg/dL   Calcium 8.6 (L) 8.9 - 10.3 mg/dL   GFR, Estimated 53 (L) >60 mL/min    Comment: (NOTE) Calculated using the CKD-EPI Creatinine Equation (2021)    Anion gap 7 5 - 15    Comment: Performed at [facility], [address], [place], [place] [address]  CBG monitoring, ED     Status: Abnormal   Collection Time: 07/03/23  7:58 AM  Result Value Ref Range   Glucose-Capillary 293 (H) 70 - 99 mg/dL    Comment: Glucose reference range applies only to samples taken after fasting for at least 8 hours.  Heparin level (unfractionated)     Status: None   Collection Time: 07/03/23  7:59 AM  Result Value Ref Range   Heparin Unfractionated 0.59 0.30 - 0.70 IU/mL    Comment: (NOTE) The clinical reportable range upper limit is being lowered to >1.10 to align with the FDA approved guidance for the current laboratory assay.  If heparin results are below expected values, and patient dosage has  been confirmed, suggest follow up testing of antithrombin III levels. Performed at [facility], [address] [place], [place] [address]   CBG monitoring, ED     Status: Abnormal   Collection Time: 07/03/23 12:17 PM  Result Value Ref Range   Glucose-Capillary 283 (H) 70 - 99 mg/dL    Comment: Glucose reference range applies only to samples taken after fasting for at least 8 hours.  Heparin level (unfractionated)     Status: None   Collection Time: 07/03/23  2:47 PM  Result Value Ref Range   Heparin Unfractionated 0.52 0.30 - 0.70 IU/mL    Comment: (NOTE) The clinical reportable range upper limit is being lowered to >1.10 to align with the FDA approved guidance for the current laboratory assay.  If heparin results are below expected values, and  patient dosage has  been confirmed, suggest follow up testing of antithrombin III levels. Performed at [facility], [address], [place], [place] [address]   Glucose, capillary     Status: Abnormal   Collection Time: 07/03/23  3:41 PM  Result Value Ref Range   Glucose-Capillary 146 (H) 70 - 99 mg/dL    Comment: Glucose reference range applies only to samples taken after fasting for at least 8 hours.  Glucose, capillary     Status: Abnormal   Collection Time: 07/03/23  7:50 PM  Result Value Ref Range   Glucose-Capillary 169 (H) 70 - 99 mg/dL    Comment: Glucose reference range applies only to samples taken after fasting for at least 8 hours.  Heparin level (unfractionated)     Status: Abnormal   Collection Time: 07/04/23  4:37 AM  Result Value Ref Range   Heparin Unfractionated 0.82 (H) 0.30 - 0.70 IU/mL    Comment: (NOTE) The clinical reportable range upper limit is being lowered to >1.10 to align with the  FDA approved guidance for the current laboratory assay.  If heparin results are below expected values, and patient dosage has  been confirmed, suggest follow up testing of antithrombin III levels. Performed at [facility], [address], [place], [place] [address]   CBC     Status: Abnormal   Collection Time: 07/04/23  4:37 AM  Result Value Ref Range   WBC 9.4 4.0 - 10.5 K/uL   RBC 4.70 4.22 - 5.81 MIL/uL   Hemoglobin 11.2 (L) 13.0 - 17.0 g/dL   HCT 60.4 (L) 54.0 - 98.1 %   MCV 77.9 (L) 80.0 - 100.0 fL   MCH 23.8 (L) 26.0 - 34.0 pg   MCHC 30.6 30.0 - 36.0 g/dL   RDW 19.1 (H) 47.8 - 29.5 %   Platelets 231 150 - 400 K/uL   nRBC 0.0 0.0 - 0.2 %    Comment: Performed at [facility], [address], [place], [place] [address]  Basic metabolic panel     Status: Abnormal   Collection Time: 07/04/23  4:37 AM  Result Value Ref Range   Sodium 137 135 - 145 mmol/L   Potassium 4.0 3.5 - 5.1 mmol/L   Chloride 108 98 - 111 mmol/L   CO2 24 22 - 32 mmol/L    Glucose, Bld 249 (H) 70 - 99 mg/dL    Comment: Glucose reference range applies only to samples taken after fasting for at least 8 hours.   BUN 21 8 - 23 mg/dL   Creatinine, Ser 8.65 (H) 0.61 - 1.24 mg/dL   Calcium 8.4 (L) 8.9 - 10.3 mg/dL   GFR, Estimated 56 (L) >60 mL/min    Comment: (NOTE) Calculated using the CKD-EPI Creatinine Equation (2021)    Anion gap 5 5 - 15    Comment: Performed at [facility], [address] [place], [place] [address]  Glucose, capillary     Status: Abnormal   Collection Time: 07/04/23  7:31 AM  Result Value Ref Range   Glucose-Capillary 211 (H) 70 - 99 mg/dL    Comment: Glucose reference range applies only to samples taken after fasting for at least 8 hours.  Lupus anticoagulant panel     Status: Abnormal   Collection Time: 08/08/23 10:59 AM  Result Value Ref Range   PTT Lupus Anticoagulant 34.1 0.0 - 43.5 sec   DRVVT 68.6 (H) 0.0 - 47.0 sec   Lupus Anticoag Interp Comment:     Comment: (NOTE) No lupus anticoagulant was detected. These results are consistent with specific inhibitors to one or more common pathway factors (X, V, II or fibrinogen). Performed At: [facility] [address] [place], [place] [id number] Jolene Schimke MD KG:[id number]   Beta-2-glycoprotein i abs, IgG/M/A     Status: None   Collection Time: 08/08/23 10:59 AM  Result Value Ref Range   Beta-2 Glyco I IgG <9 0 - 20 GPI IgG units    Comment: (NOTE) The reference interval reflects a 3SD or 99th percentile interval, which is thought to represent a potentially clinically significant result in accordance with the International Consensus Statement on the classification criteria for definitive antiphospholipid syndrome (APS). J Thromb Haem 2006;4:295-306.    Beta-2-Glycoprotein I IgM <9 0 - 32 GPI IgM units    Comment: (NOTE) The reference interval reflects a 3SD or 99th percentile interval, which is thought to represent a potentially clinically  significant result in accordance with the International Consensus Statement on the classification criteria  for definitive antiphospholipid syndrome (APS). J Thromb Haem 2006;4:295-306. Performed At: [facility] [address] [place], [place] [id number] Jolene Schimke MD WU:[id number]    Beta-2-Glycoprotein I IgA <9 0 - 25 GPI IgA units    Comment: (NOTE) The reference interval reflects a 3SD or 99th percentile interval, which is thought to represent a potentially clinically significant result in accordance with the International Consensus Statement on the classification criteria for definitive antiphospholipid syndrome (APS). J Thromb Haem 2006;4:295-306.   Cardiolipin antibodies, IgG, IgM, IgA     Status: None   Collection Time: 08/08/23 10:59 AM  Result Value Ref Range   Anticardiolipin IgG <9 0 - 14 GPL U/mL    Comment: (NOTE)                          Negative:              <15                          Indeterminate:     15 - 20                          Low-Med Positive: >20 - 80                          High Positive:         >80    Anticardiolipin IgM <9 0 - 12 MPL U/mL    Comment: (NOTE)                          Negative:              <13                          Indeterminate:     13 - 20                          Low-Med Positive: >20 - 80                          High Positive:         >80    Anticardiolipin IgA <9 0 - 11 APL U/mL    Comment: (NOTE)                          Negative:              <12                          Indeterminate:     12 - 20                          Low-Med Positive: >20 - 80                          High Positive:         >80 Performed At: [facility] [address] [place], [place] [id number] Jolene Schimke MD VH:[id number]   Prothrombin gene mutation     Status: None   Collection  Time: 08/08/23 10:59 AM  Result Value Ref Range   Recommendations-PTGENE: Comment     Comment: (NOTE) Result: c.*97G>A - Not  Detected This result is not associated with an increased risk for venous thromboembolism. See Additional Clinical Information and Comments. Additional Clinical Information: Venous thromboembolism is a multifactorial disease influenced by genetic, environmental, and circumstantial risk factors. The c.*97G>A variant in the F2 gene is a genetic risk factor for venous thromboembolism. Heterozygous carriers have a 2- to 4-fold increased risk for venous thromboembolism. Homozygotes for the c.*97G>A variant are rare. The annual risk of VTE in homozygotes has been reported to be 1.1%/year. Individuals who carry both a c.*97G>A variant in the F2 gene and a c.1601G>A (p. Arg534Gln) variant in the F5 gene (commonly referred to as Factor V Leiden) have an approximately 20- fold increased risk for venous thromboembolism. Risks are likely to be even higher in more complex genotype combinations involving the F2 c.*97G>A variant and Factor V Leiden (PMID:  44010272). Additional risk factors include but are not limited to: deficiency of protein C, protein S, or antithrombin III, age, male sex, personal or family history of deep vein thromboembolism, smoking, surgery, prolonged immobilization, malignant neoplasm, tamoxifen treatment, raloxifene treatment, oral contraceptive use, hormone replacement therapy, and pregnancy. Management of thrombotic risk and thrombotic events should follow established guidelines and fit the clinical circumstance. This result cannot predict the occurrence or recurrence of a thrombotic event. Comments: Genetic counseling is recommended to discuss the potential clinical implications of positive results, as well as recommendations for testing family members. Genetic Coordinators are available for health care providers to discuss results at [phone]). Test Details: Variant analyzed: c.*97G>A, previously referred to as G20210A Methods/Limitations: DNA analysis  of the F2 gene (NM_000 506.5) was performed by PCR amplification followed by restriction enzyme analysis. The diagnostic sensitivity is >99%. Results must be combined with clinical information for the most accurate interpretation. Molecular-based testing is highly accurate, but as in any laboratory test, diagnostic errors may occur. False positive or false negative results may occur for reasons that include genetic variants, blood transfusions, bone marrow transplantation, somatic or tissue-specific mosaicism, mislabeled samples, or erroneous representation of family relationships. This test was developed and its performance characteristics determined by [facility]. It has not been cleared or approved by the Food and Drug Administration. References: Dewitt Hoes [facility], Valentina Lucks [facility]; ACMG Professional Practice and Guidelines Committee. Addendum: [employer] of Medical Genetics consensus statement on factor V Leiden mutation testing. Genet Med. 2021 Mar 5. doi: 44.0347/Q259 [phone]. PMID: 56387564. Cherrie Gauze. Prothrombin Thrombophilia. 2006 Jul 25 [Updated 2021 Feb 4]. In: Bufford Buttner, Ardinger HH, Pagon RA, et al., editors. GeneReviews(R) [Internet]. [address] ([place]): [place], [place]; 3329-5188. Available from: [email or web address]://[email or web address].dunlap.com/ Nita Sickle, Blanca Friend, Alvie Heidelberg CS; ACMG Laboratory Quality Assurance Committee. Venous thromboembolism laboratory testing (factor V Leiden and factor II c.*97G>A), 2018 update: a technical standard of the [employer] of [employer] and Genomics (ACMG). Genet Med. 2018 Dec;20(12):1489-1498. doi: 10.1038/s41436-(902) 259-0654-z. Epub 2018 Oct 5. PMID: 41660630.    Reviewed by: Comment     Comment: (NOTE) Technical Component performed at [employer] [facility] Professional Component performed by: [employer]. Redmond Baseman, Ph.D.,  [facility] Director, Molecular Genetics [address] [place] [address] Performed At: [facility] [address] [place], [place] [id number] Maurine Simmering MDPhD KG:[id number]   Factor 5 leiden     Status: None  Collection Time: 08/08/23 10:59 AM  Result Value Ref Range   Recommendations-F5LEID: Comment     Comment: (NOTE) Result: c.1601G>A (p.Arg534Gln) - Not Detected This result is not associated with an increased risk for venous thromboembolism. See Additional Clinical Information and Comments. Additional Clinical Information: Venous thromboembolism is a multifactorial disease influenced by genetic, environmental, and circumstantial risk factors. The c.1601G>A (p. Arg534Gln) variant in the F5 gene, commonly referred to as Factor V Leiden, is a genetic risk factor for venous thromboembolism. Heterozygous carriers of this variant have a 6- to 8- fold increased risk for venous thromboembolism. Individuals homozygous for this variant (ie, with a copy of the variant on each chromosome) have an approximately 80-fold increased risk for venous thromboembolism. Individuals who carry both a c.*97G>A variant in the F2 gene and Factor V Leiden have an approximately 20-fold increased risk for venous thromboembolism. Risks are likely to be even higher in more complex genotype combinations in volving the F2 c.*97G>A variant and Factor V Leiden (PMID: 16109604). Additional risk factors include but are not limited to: deficiency of protein C, protein S, or antithrombin III, age, male sex, personal or family history of deep vein thromboembolism, smoking, surgery, prolonged immobilization, malignant neoplasm, tamoxifen treatment, raloxifene treatment, oral contraceptive use, hormone replacement therapy, and pregnancy. Management of thrombotic risk and thrombotic events should follow established guidelines and fit the clinical circumstance. This result cannot predict the occurrence or  recurrence of a thrombotic event. Comment: Genetic counseling is recommended to discuss the potential clinical implications of positive results, as well as recommendations for testing family members. Genetic Coordinators are available for health care providers to discuss results at [phone]). Test Details: Variant Analyzed: c.1601G>A (p. Arg534Gln), referred to as Fact or V Leiden Methods/Limitations: DNA analysis of the F5 gene (NM_000130.5) was performed by PCR amplification followed by restriction enzyme analysis. The diagnostic sensitivity is >99%. Results must be combined with clinical information for the most accurate interpretation. Molecular- based testing is highly accurate, but as in any laboratory test, diagnostic errors may occur. False positive or false negative results may occur for reasons that include genetic variants, blood transfusions, bone marrow transplantation, somatic or tissue-specific mosaicism, mislabeled samples, or erroneous representation of family relationships. This test was developed and its performance characteristics determined by [facility]. It has not been cleared or approved by the Food and Drug Administration. References: Dewitt Hoes [facility], Valentina Lucks [facility]; ACMG Professional Practice and Guidelines Committee. Addendum: [employer] of Medical Genetics consensus statement on fac tor V Leiden mutation testing. Genet Med. 2021 Mar 5. doi: 81.1914/N82956-213- 01108-x. PMID: 08657846. Cherrie Gauze. Factor V Leiden Thrombophilia. 1999 May 14 (Updated 2018 Jan 4). In: Bufford Buttner, Ardinger HH, Pagon RA, et al., editors. GeneReviews(R) (Internet). [address] ([place]): [place], [place]; 9629-5284. Available from: [email or web address]-mcgee.org/ Nita Sickle, Blanca Friend, Alvie Heidelberg CS; ACMG Laboratory Quality Assurance Committee. Venous thromboembolism laboratory testing (factor  V Leiden and factor II c.*97G>A), 2018 update: a technical standard of the [employer] of [employer] and Genomics (ACMG). Genet Med. 2018 Dec;20(12):1489-1498. doi: 10.1038/s41436-308-175-2955-z. Epub 2018 Oct 5. PMID: 13244010.    Reviewed By: Comment     Comment: (NOTE) Technical Component performed at [employer] [facility] Professional Component performed by: [employer], Ph.D., [facility] Director, Molecular Genetics [address] [place] [address] Performed At: [employer] [facility] [address] [place], [place] [id number] Maurine Simmering MDPhD [id number]   D-dimer,  quantitative     Status: None   Collection Time: 08/08/23 10:59 AM  Result Value Ref Range   D-Dimer, Quant 0.47 0.00 - 0.50 ug/mL-FEU    Comment: (NOTE) At the manufacturer cut-off value of 0.5 g/mL FEU, this assay has a negative predictive value of 95-100%.This assay is intended for use in conjunction with a clinical pretest probability (PTP) assessment model to exclude pulmonary embolism (PE) and deep venous thrombosis (DVT) in outpatients suspected of PE or DVT. Results should be correlated with clinical presentation. Performed at [facility], [address], [place], [place] [address]   CMP (Cancer Center only)     Status: Abnormal   Collection Time: 08/08/23 10:59 AM  Result Value Ref Range   Sodium 138 135 - 145 mmol/L   Potassium 3.7 3.5 - 5.1 mmol/L   Chloride 102 98 - 111 mmol/L   CO2 26 22 - 32 mmol/L   Glucose, Bld 198 (H) 70 - 99 mg/dL    Comment: Glucose reference range applies only to samples taken after fasting for at least 8 hours.   BUN 46 (H) 8 - 23 mg/dL   Creatinine 6.21 (H) 3.08 - 1.24 mg/dL   Calcium 9.4 8.9 - 65.7 mg/dL   Total Protein 7.2 6.5 - 8.1 g/dL   Albumin 4.2 3.5 - 5.0 g/dL   AST 24 15 - 41 U/L   ALT 26 0 - 44 U/L   Alkaline Phosphatase 47 38 - 126 U/L   Total Bilirubin 0.4 0.0 - 1.2 mg/dL   GFR, Estimated 47 (L) >60  mL/min    Comment: (NOTE) Calculated using the CKD-EPI Creatinine Equation (2021)    Anion gap 10 5 - 15    Comment: Performed at [facility], [address], [place], [place] [address]  CBC with Differential (Cancer Center Only)     Status: Abnormal   Collection Time: 08/08/23 10:59 AM  Result Value Ref Range   WBC Count 9.4 4.0 - 10.5 K/uL   RBC 4.93 4.22 - 5.81 MIL/uL   Hemoglobin 11.5 (L) 13.0 - 17.0 g/dL   HCT 29.5 (L) 28.4 - 13.2 %   MCV 77.3 (L) 80.0 - 100.0 fL   MCH 23.3 (L) 26.0 - 34.0 pg   MCHC 30.2 30.0 - 36.0 g/dL   RDW 44.0 (H) 10.2 - 72.5 %   Platelet Count 288 150 - 400 K/uL   nRBC 0.0 0.0 - 0.2 %   Neutrophils Relative % 70 %   Neutro Abs 6.6 1.7 - 7.7 K/uL   Lymphocytes Relative 17 %   Lymphs Abs 1.6 0.7 - 4.0 K/uL   Monocytes Relative 6 %   Monocytes Absolute 0.6 0.1 - 1.0 K/uL   Eosinophils Relative 6 %   Eosinophils Absolute 0.5 0.0 - 0.5 K/uL   Basophils Relative 0 %   Basophils Absolute 0.0 0.0 - 0.1 K/uL   Immature Granulocytes 1 %   Abs Immature Granulocytes 0.05 0.00 - 0.07 K/uL    Comment: Performed at [facility], [address] [place], [place] [address]  dRVVT Mix     Status: Abnormal   Collection Time: 08/08/23 10:59 AM  Result Value Ref Range   dRVVT Mix 53.0 (H) 0.0 - 40.4 sec    Comment: (NOTE) Performed At: [facility] [address] [place], [place] [id number] Jolene Schimke MD GL:[id number]   dRVVT Confirm     Status: None   Collection Time:  08/08/23 10:59 AM  Result Value Ref Range   dRVVT Confirm 1.1 0.8 - 1.2 ratio    Comment: (NOTE) Performed At: [facility] [address] [place], [place] [id number] Jolene Schimke MD WU:[id number]      RADIOGRAPHIC STUDIES:  I have personally reviewed the radiological images as listed and agree with the findings in the report.  VAS [place] LOWER EXTREMITY SAPHENOUS VEIN MAPPING Result Date: 08/09/2023 LOWER EXTREMITY VEIN  MAPPING Patient Name:  BUCKLEY BRADLY  Date of Exam:   08/09/2023 Medical Rec #: [id number]          Accession #:    [id number] Date of Birth: 1950-07-02         Patient Gender: M Patient Age:   73 years Exam Location:  [facility] Procedure:      VAS [place] LOWER EXTREMITY SAPHENOUS VEIN MAPPING Referring Phys: Carolynn Sayers --------------------------------------------------------------------------------  Indications:        Pre-op evaluation Other Risk Factors: Bilateral aneurysmal dilatation of popliteal artery.  Performing Technologist: Dorthula Matas RVS, RCS  Examination Guidelines: A complete evaluation includes B-mode imaging, spectral Doppler, color Doppler, and power Doppler as needed of all accessible portions of each vessel. Bilateral testing is considered an integral part of a complete examination. Limited examinations for reoccurring indications may be performed as noted. +-------------+-------------+-----------------------+-------------+------------+  RT Diameter  RT Findings           GSV           LT Diameter LT Findings      (cm)                                             (cm)                  +-------------+-------------+-----------------------+-------------+------------+     0.56                  Saphenofemoral Junction    0.72                  +-------------+-------------+-----------------------+-------------+------------+     0.45                      Proximal thigh         0.54                  +-------------+-------------+-----------------------+-------------+------------+     0.27       branching         Mid thigh           0.51        out of                                                                     fascia    +-------------+-------------+-----------------------+-------------+------------+     0.37     out of fascia     Distal thigh          0.48        out of  fascia    +-------------+-------------+-----------------------+-------------+------------+     0.35                           Knee              0.37                  +-------------+-------------+-----------------------+-------------+------------+     0.38                         Prox calf           0.30                  +-------------+-------------+-----------------------+-------------+------------+     0.35                         Mid calf            0.36                  +-------------+-------------+-----------------------+-------------+------------+     0.35                        Distal calf          0.35                  +-------------+-------------+-----------------------+-------------+------------+ +----------------+-----------+---------------+----------------+-----------+ RT diameter (cm)RT Findings      SSV      LT Diameter (cm)LT Findings +----------------+-----------+---------------+----------------+-----------+       0.42                 Popliteal fossa      0.28                  +----------------+-----------+---------------+----------------+-----------+       0.35                  Proximal calf       0.18                  +----------------+-----------+---------------+----------------+-----------+       0.23                    Mid calf          0.41                  +----------------+-----------+---------------+----------------+-----------+       0.44                   Distal calf        0.33                  +----------------+-----------+---------------+----------------+-----------+ Summary:  Right: Patent and compressible great and small saphenous veins. Left: Patent and compressible great and small saphenous veins.  *See table(s) above for measurements and observations. Diagnosing physician: Gerarda Fraction Electronically signed by Gerarda Fraction on 08/09/2023 at 10:35:10 AM.    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 EXAM: CT ANGIOGRAPHY OF ABDOMINAL AORTA WITH ILIOFEMORAL RUNOFF TECHNIQUE: Multidetector CT imaging of the abdomen, pelvis and lower extremities was performed using the standard protocol during bolus administration of intravenous contrast. Multiplanar CT image reconstructions and MIPs were obtained to evaluate the vascular anatomy. RADIATION DOSE REDUCTION: This  exam was performed according to the departmental dose-optimization program which includes automated exposure control, adjustment of the mA and/or kV according to patient size and/or use of iterative reconstruction technique. CONTRAST:  ISOVUE-370 IOPAMIDOL (ISOVUE-370) INJECTION 76% COMPARISON:  PET-CT from 11/04/2020 FINDINGS: VASCULAR Aorta: Normal caliber aorta without aneurysm, dissection, vasculitis or significant stenosis. Scattered atherosclerotic calcifications. Celiac: Patent without evidence of aneurysm, dissection, vasculitis or significant stenosis. SMA: Patent without evidence of aneurysm, dissection, vasculitis or significant stenosis. Renals: Single bilateral renal arteries are patent without evidence of aneurysm, dissection, vasculitis, fibromuscular dysplasia or significant stenosis. IMA: Patent without evidence of aneurysm, dissection, vasculitis or significant stenosis. RIGHT Lower Extremity Inflow: Common, internal and external iliac arteries are patent without evidence of aneurysm, dissection, vasculitis or significant stenosis. Outflow: Fusiform aneurysmal dilation of the proximal popliteal artery measuring up to 2.2 cm. Common, superficial and profunda femoral arteries are patent without evidence of aneurysm, dissection, vasculitis or significant stenosis. Runoff: Patent three vessel runoff to the ankle. LEFT Lower Extremity Inflow: Common, internal and external iliac arteries are patent without evidence of aneurysm, dissection,  vasculitis or significant stenosis. Outflow: Fusiform aneurysmal dilation of the proximal popliteal artery measuring up to 2.6 cm. Common, superficial and profunda femoral arteries are patent without evidence of aneurysm, dissection, vasculitis or significant stenosis. Runoff: Patent three vessel runoff to the ankle. Veins: No obvious venous abnormality within the limitations of this arterial phase study. Review of the MIP images confirms the above findings. NON-VASCULAR Lower chest: No acute abnormality. Hepatobiliary: No focal liver abnormality is seen. No gallstones, gallbladder wall thickening, or biliary dilatation. Pancreas: Unremarkable. No pancreatic ductal dilatation or surrounding inflammatory changes. Spleen: Normal in size without focal abnormality. Adrenals/Urinary Tract: Adrenal glands are unremarkable. Multifocal scattered subcentimeter simple renal cysts, not requiring additional follow-up. Kidneys are normal, without renal calculi, focal lesion, or hydronephrosis. Bladder is unremarkable. Stomach/Bowel: Stomach is within normal limits. Appendix appears normal. Scattered diverticula in the transverse and descending colon without surrounding inflammatory changes. No evidence of bowel wall thickening, distention, or inflammatory changes. Lymphatic: No abdominopelvic lymphadenopathy. Reproductive: The prostate gland measures 5.8 cm in maximum axial dimension with scattered dystrophic calcifications. Other: Bilateral fat containing inguinal hernias.  No ascites. Musculoskeletal: No acute osseous abnormality. Postsurgical changes after L4-L5 PSIF without complicating features. IMPRESSION: VASCULAR 1. 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 Otherwise patent bilateral inflow, outflow, and 3 vessel runoff. 2.  Aortic Atherosclerosis (ICD10-I70.0). NON-VASCULAR 1. No acute abdominopelvic abnormality. 2. Diverticulosis.  3. Prostatomegaly. 4. Bilateral fat containing inguinal hernias without complicating features. Marliss Coots, MD [facility]iology Electronically Signed   By: Marliss Coots M.D.   On: 08/01/2023 12:59    Orders Placed This Encounter  Procedures   CBC with Differential (Cancer Center Only)    Standing Status:   Future    Expected Date:   01/04/2024    Expiration Date:   08/16/2024   Iron and Iron Binding Capacity ([facility] only)    Standing Status:   Future    Expected Date:   01/04/2024    Expiration Date:   08/16/2024   Ferritin    Standing Status:   Future    Expected Date:   01/04/2024    Expiration Date:   08/16/2024   Vitamin B12    Standing Status:   Future    Expected Date:   01/04/2024  Expiration Date:   08/16/2024   Folate    Standing Status:   Future    Expected Date:   01/04/2024    Expiration Date:   08/16/2024   Protein C, total    Standing Status:   Future    Expected Date:   01/04/2024    Expiration Date:   08/16/2024   PROTEIN S PANEL    Standing Status:   Future    Expected Date:   01/04/2024    Expiration Date:   08/16/2024   Antithrombin panel    Standing Status:   Future    Expected Date:   01/04/2024    Expiration Date:   08/16/2024   D-dimer, quantitative    Standing Status:   Future    Expected Date:   01/04/2024    Expiration Date:   08/16/2024     Future Appointments  Date Time Provider Department Center  08/29/2023  9:30 AM Dohmeier, Porfirio Mylar, MD [facility] None  10/05/2023  9:00 AM Hunsucker, Lesia Sago, MD [facility] None  01/04/2024 10:00 AM [facility] None  01/04/2024 10:30 AM Shahzaib Azevedo, Archie Patten, MD [facility] None    This document was completed utilizing speech recognition software. Grammatical errors, random word insertions, pronoun errors, and incomplete sentences are an occasional consequence of this system due to software limitations, ambient noise, and hardware issues. Any formal  questions or concerns about the content, text or information contained within the body of this dictation should be directly addressed to the provider for clarification.

## 2023-08-17 NOTE — Assessment & Plan Note (Signed)
 He has a popliteal artery aneurysm identified via CT scan. Stent placement is recommended but not urgent. The procedure requires temporary cessation of blood thinners for 2-3 days, acceptable after at least 3 months of anticoagulation therapy. The vascular doctor concurs with this plan. - Schedule stent placement after at least 3 months of anticoagulation therapy - Temporarily stop blood thinners for 2-3 days for the procedure

## 2023-08-17 NOTE — Assessment & Plan Note (Signed)
 He has mild anemia with a hemoglobin level of 11.5, slightly improved from previous labs. No reported blood loss or gastrointestinal bleeding. Further evaluation is planned with the primary care physician. - Request primary care physician to check CBC and iron studies at the next visit  Will obtain additional workup on return visit with [place].

## 2023-08-17 NOTE — Assessment & Plan Note (Signed)
 Continue anticoagulation as mentioned above

## 2023-08-17 NOTE — Assessment & Plan Note (Signed)
 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   Significant symptom improvement reported since starting anticoagulation, resumed gym activities.   Current treatment involves anticoagulation therapy with Eliquis 5 mg twice daily.    On his initial consultation with [place] on 08/08/2023, we obtained thrombophilia workup with factor V Leiden mutation, prothrombin gene mutation, beta-2 glycoprotein antibodies, anticardiolipin antibodies, lupus anticoagulant testing and it was all unremarkable.  D-dimer was normal at 0.47.  Rest of the hypercoagulable workup was deferred as it can be falsely abnormal given recent thromboembolism.  This will be obtained on return visit.   - Continue anticoagulation therapy with 5 mg twice daily for six months.  We may potentially extend it to 1 year depending on results of above workup.    - Plan follow-up blood tests in five months to reassess clotting risk and determine the duration of anticoagulation therapy.

## 2023-08-27 DIAGNOSIS — E1122 Type 2 diabetes mellitus with diabetic chronic kidney disease: Secondary | ICD-10-CM | POA: Diagnosis not present

## 2023-08-27 DIAGNOSIS — E785 Hyperlipidemia, unspecified: Secondary | ICD-10-CM | POA: Diagnosis not present

## 2023-08-27 DIAGNOSIS — I2510 Atherosclerotic heart disease of native coronary artery without angina pectoris: Secondary | ICD-10-CM | POA: Diagnosis not present

## 2023-08-27 DIAGNOSIS — R051 Acute cough: Secondary | ICD-10-CM | POA: Diagnosis not present

## 2023-08-27 DIAGNOSIS — I2699 Other pulmonary embolism without acute cor pulmonale: Secondary | ICD-10-CM | POA: Diagnosis not present

## 2023-08-27 DIAGNOSIS — J45909 Unspecified asthma, uncomplicated: Secondary | ICD-10-CM | POA: Diagnosis not present

## 2023-08-27 DIAGNOSIS — I130 Hypertensive heart and chronic kidney disease with heart failure and stage 1 through stage 4 chronic kidney disease, or unspecified chronic kidney disease: Secondary | ICD-10-CM | POA: Diagnosis not present

## 2023-08-27 DIAGNOSIS — I5042 Chronic combined systolic (congestive) and diastolic (congestive) heart failure: Secondary | ICD-10-CM | POA: Diagnosis not present

## 2023-08-27 DIAGNOSIS — N182 Chronic kidney disease, stage 2 (mild): Secondary | ICD-10-CM | POA: Diagnosis not present

## 2023-08-27 DIAGNOSIS — G4733 Obstructive sleep apnea (adult) (pediatric): Secondary | ICD-10-CM | POA: Diagnosis not present

## 2023-08-27 DIAGNOSIS — R0902 Hypoxemia: Secondary | ICD-10-CM | POA: Diagnosis not present

## 2023-08-27 DIAGNOSIS — Z6830 Body mass index (BMI) 30.0-30.9, adult: Secondary | ICD-10-CM | POA: Diagnosis not present

## 2023-08-28 NOTE — Progress Notes (Unsigned)
 Donald [name]

## 2023-08-29 ENCOUNTER — Encounter: Payer: Self-pay | Admitting: Neurology

## 2023-08-29 ENCOUNTER — Telehealth: Payer: Self-pay | Admitting: Neurology

## 2023-08-29 ENCOUNTER — Ambulatory Visit: Payer: PPO | Admitting: Neurology

## 2023-08-29 DIAGNOSIS — G4733 Obstructive sleep apnea (adult) (pediatric): Secondary | ICD-10-CM

## 2023-08-29 DIAGNOSIS — H900 Conductive hearing loss, bilateral: Secondary | ICD-10-CM | POA: Diagnosis not present

## 2023-08-29 DIAGNOSIS — G4734 Idiopathic sleep related nonobstructive alveolar hypoventilation: Secondary | ICD-10-CM | POA: Diagnosis not present

## 2023-08-29 DIAGNOSIS — G3184 Mild cognitive impairment, so stated: Secondary | ICD-10-CM

## 2023-08-29 MED ORDER — MEMANTINE HCL 10 MG PO TABS: 10.0000 mg | ORAL_TABLET | Freq: Two times a day (BID) | ORAL | 3 refills | Status: DC

## 2023-08-29 NOTE — Progress Notes (Signed)
 Provider:  Neomia Banner, MD  Primary Care Physician:  Lonzie Robins, MD [address] [place] [address]     Referring Provider: Lonzie Robins, Md [address] [place],  [place] [address]          Chief Complaint according to patient   Patient presents with:                HISTORY OF PRESENT ILLNESS:  Donald [name] is a 73 y.o. male patient who is here for revisit 08/29/2023 yearly revisit . Last seen by Terrilyn Fick, NP.   Last seen for hypoxia in sleep  in 2023. He has meanwhile experienced  several health conditions. He developed  DVT in  the left leg,  bilateral aneurysmata-  popliteal,  he had PEs  - these were found after SOB and finally dx November 2024, and was placed on Oxygen  by his pulmonologist.  At times he used 8 L/minute ! He is still in work up for clotting disorders, Dr. Kearney Passer.  He had pneumonia post influenza in February 2025. Dr Annia Barth had scheduled him for a left heart catheter, but this is now not needed.  He is feeling much better  with his breathing on blood thinners.   Chief concern according to patient : " Needing to look at  memory, OSA and CPAP again . Using oxygen  only at night".  Mr. [name] was last seen in a sleep study in August 2023 the study date for his polysomnography was 12-25-2021 and at the time he presented with a history of severe sleep apnea had been on auto CPAP device but had still signs of possible hypoxemia while using CPAP.  He also felt his memory had declined and therefore we investigated further about his oxygen  at night.  The patient used a nasal pillow 7 cm water pressure on CPAP but was switched during the night to 2 and a [place]a fullface mask.  Oxygen  saturation remained below 88% before supplementation the patient was first given just 1 L of oxygen  but soon developed apneas after sleep onset and required higher levels of oxygen .  On the CPAP his residual AHI was only 2.5/h overall.  He did well on 10 cm  water with 3 cm EPR with a fullface mask.  Oxygen  was surprisingly not well-tolerated the patient never reached REM sleep.  He was prescribed an auto titration CPAP device with a setting between 7 and 16 cm with 3 cm EPR.  His machine at the time was only 72 years old so he asked to reset the current machine which would now be ready for replacement anyway.  Past to his memory concerns the patient was today tested by a Montreal cognitive assessment test and scored 26 out of 30 points which is mild impairment it is actually still in the normal range.  His wife stated he is not more alert, not more able to focus.      08/29/2023    9:18 AM 08/24/2022   10:04 AM 05/10/2021    3:41 PM 04/15/2020    9:51 AM  Montreal Cognitive Assessment   Visuospatial/ Executive (0/5) 5 5 5 4   Naming (0/3) 3 3 3 3   Attention: Read list of digits (0/2) 2 2 2 2   Attention: Read list of letters (0/1) 1 1 1 1   Attention: Serial 7 subtraction starting at 100 (0/3) 3 1 2 3   Language: Repeat phrase (0/2) 2 2 2  2  Language : Fluency (0/1) 0 1 1 1   Abstraction (0/2) 2 2 2 2   Delayed Recall (0/5) 2 1 2 2   Orientation (0/6) 6 6 6 6   Total 26 24 26 26      08/24/22 ALL:  Donald [name] is a 73 y.o. male here today for follow up for OSA on CPAP with supplemental O2 and memory loss. He was last seen by Dr Albertina Hugger 02/2022. He was advised to continue CPAP with 4L O2 and return in 6 months for MOCA. MOCA 26/30 05/2021. He was started on memantine  10mg  BID in 10/2020. He seems to tolerate well. Donepezil  started in 2021 but discontinued due to nausea. Neurocog eval with Dr Cheryll Corti 12/2021 showed mild reduction in global cognitive functioning. Mild worsening of processing speed but other cognitive domains stable. No clear neurodegenerative findings. He continues memantine  10mg  BID. He is tolerating well. He feels memory is stable. He notes period of time where he can't remember what he is supposed to be doing. He feels recall is good.  He does admit to being easily distracted. He is sleeping well. He reports a total sleep time of about 11 hours a day. He takes naps throughout the day. He is trying to exercise regularly. He is working with a Systems analyst a couple times a week. Last A1C 8.5. He admits that he forgets to take his insulin  at times. He has not been eating as healthy as he was. He has chronic back pain. He is followed by [facility]. He recently had a nerve ablation. He admits to more depression at times. He is followed by Dr Thor Fling. He feels duloxetine  helps with neuropathy and mood management.      Review of Systems: Out of a complete 14 system review, the patient complains of only the following symptoms, and all other reviewed systems are negative.:   ESS 10/ 24, FSS at 54/ 63 . GDS ; 5/ 15 points.    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   Tobacco Use   Smoking status: Never   Smokeless tobacco: Never   Tobacco comments:    Second hand smoke for 26 years per pt  Vaping Use   Vaping status: Never Used  Substance and Sexual Activity   Alcohol  use: No    Alcohol /week: 0.0 standard drinks of alcohol    Drug use: No   Sexual activity: Yes  Other Topics Concern   Not on file  Social History Narrative   Patient is married (Diane) and lives at home with his wife.   Patient has one child.   Patient is working full-time.   Patient has a Scientist, research (physical sciences).   Patient is left-handed.   Patient drinks one glass of tea daily, one soda daily.   Social Drivers of Corporate investment banker Strain: Not on file  Food Insecurity: No Food Insecurity (07/03/2023)   Hunger Vital Sign    Worried About Running Out of Food in the Last Year: Never true    Ran Out of Food in the Last  Year: Never true  Transportation Needs: No Transportation Needs (07/03/2023)   PRAPARE - Transportation    Lack of Transportation (Medical): No    Lack of  Transportation (Non-Medical): No  Physical Activity: Not on file  Stress: Not on file  Social Connections: Moderately Isolated (07/03/2023)   Social Connection and Isolation Panel [NHANES]    Frequency of Communication with Friends and Family: Twice a week    Frequency of Social Gatherings with Friends and Family: Once a week    Attends Religious Services: Never    Database administrator or Organizations: No    Attends Engineer, structural: Never    Marital Status: Married    Family History  Problem Relation Age of Onset   Coronary artery disease Father    Diabetes type II Father    Heart attack Father     Past Medical History:  Diagnosis Date   Allergic rhinitis, seasonal    Asthma    Bronchitis    CHF (congestive heart failure) (HCC)    Chronic kidney disease, stage I    Cognitive impairment    Effects Short Term Memory   collar bone    dislocation left side 05/2014   Degenerative joint disease of knee    bilateral   Diabetes mellitus    Type 2   Dysrhythmia    PVCs   Fatty liver    GERD (gastroesophageal reflux disease)    Headache    Heachaces r/t eyes   Heart murmur    history of   History of acute prostatitis    History of hematuria    History of hiatal hernia    H/O   Hyperlipidemia    Hypertension    IgA nephropathy    OSA on CPAP    Pneumonia    Sinusitis     Past Surgical History:  Procedure Laterality Date   BIOPSY  05/17/2020   Procedure: BIOPSY;  Surgeon: Ozell Blunt, MD;  Location: [facility];  Service: Endoscopy;;   CARDIAC CATHETERIZATION  09/29/2011   normal   CATARACT EXTRACTION Bilateral    CERVICAL SPINE SURGERY     COLONOSCOPY WITH PROPOFOL  N/A 05/17/2020   Procedure: COLONOSCOPY WITH PROPOFOL ;  Surgeon: Ozell Blunt, MD;  Location: [facility];  Service: Endoscopy;   Laterality: N/A;   deviated septum     1990s   ESOPHAGOGASTRODUODENOSCOPY (EGD) WITH PROPOFOL  N/A 05/17/2020   Procedure: ESOPHAGOGASTRODUODENOSCOPY (EGD) WITH PROPOFOL ;  Surgeon: Ozell Blunt, MD;  Location: [facility];  Service: Endoscopy;  Laterality: N/A;   HEMOSTASIS CLIP PLACEMENT  05/17/2020   Procedure: HEMOSTASIS CLIP PLACEMENT;  Surgeon: Ozell Blunt, MD;  Location: [facility];  Service: Endoscopy;;   HERNIA REPAIR     LEFT HEART CATHETERIZATION WITH CORONARY ANGIOGRAM N/A 09/29/2011   Procedure: LEFT HEART CATHETERIZATION WITH CORONARY ANGIOGRAM;  Surgeon: Luana Rumple, MD;  Location: [facility];  Service: Cardiovascular;  Laterality: N/A;   NM MYOVIEW  LTD  07/08/2011   mild LV dilatation,mild septal hypokinesia   POLYPECTOMY  05/17/2020   Procedure: POLYPECTOMY;  Surgeon: Ozell Blunt, MD;  Location: [facility];  Service: Endoscopy;;   RIGHT HEART CATH N/A 04/17/2023   Procedure: RIGHT HEART CATH;  Surgeon: Arleen Lacer, MD;  Location: [facility] INVASIVE CV LAB;  Service: Cardiovascular;  Laterality: N/A;   TONSILLECTOMY     US  ECHOCARDIOGRAPHY  09/07/2011   EF 35-45%,mod.ant hypokinesis,boderline LA & RA enlargement,trace MR,TR & PI     Current Outpatient Medications on File Prior to Visit  Medication Sig Dispense Refill   apixaban  (ELIQUIS ) 5 MG TABS tablet Take 2 tablets (10mg ) by mouth twice daily for 7 days, then switch  to 1 tablet (5mg ) by mouth twice daily. 90 tablet 3   Continuous Blood Gluc Sensor (FREESTYLE LIBRE 14 DAY SENSOR) MISC Inject 1 Application into the skin every 14 (fourteen) days.     finasteride  (PROSCAR ) 5 MG tablet Take 5 mg by mouth in the morning.     fluticasone  (FLONASE ) 50 MCG/ACT nasal spray Place 1 spray into both nostrils daily.     furosemide  (LASIX ) 20 MG tablet Take 10 mg by mouth daily.     HYDROcodone -acetaminophen  (NORCO/VICODIN) 5-325 MG tablet Take 1-2 tablets by mouth every 4 (four) hours as needed for moderate pain ((score 4 to  6)). 30 tablet 0   insulin  lispro (HUMALOG  KWIKPEN) 200 UNIT/ML KwikPen Inject 30-130 Units into the skin in the morning, at noon, and at bedtime. Sliding scale If blood sugar is over 140     loratadine  (CLARITIN ) 10 MG tablet Take 10 mg by mouth at bedtime.     memantine  (NAMENDA ) 10 MG tablet Take 1 tablet (10 mg total) by mouth 2 (two) times daily. 180 tablet 3   metFORMIN  (GLUCOPHAGE ) 1000 MG tablet Take 1 tablet (1,000 mg total) by mouth 2 (two) times daily with a meal. Restart on 8/22     metoprolol  succinate (TOPROL -XL) 50 MG 24 hr tablet Take 50 mg by mouth 2 (two) times daily. Take with or immediately following a meal.     pantoprazole  (PROTONIX ) 40 MG tablet Take 1 tablet (40 mg total) by mouth 2 (two) times daily. 180 tablet 0   rosuvastatin  (CRESTOR ) 20 MG tablet TAKE ONE TABLET BY MOUTH ONE TIME DAILY 90 tablet 3   sacubitril -valsartan  (ENTRESTO ) 97-103 MG TAKE ONE TABLET BY MOUTH TWICE DAILY 180 tablet 3   silodosin (RAPAFLO) 8 MG CAPS capsule Take 8 mg by mouth every evening.     spironolactone  (ALDACTONE ) 25 MG tablet Take one-half tablet by mouth daily 45 tablet 3   TRESIBA FLEXTOUCH 200 UNIT/ML FlexTouch Pen Inject 100 Units into the skin in the morning.     No current facility-administered medications on file prior to visit.    Allergies  Allergen Reactions   Ciprofloxacin     Tendons problem in shoulder Other reaction(s): Arthralgias (intolerance), Myalgias (intolerance)   Empagliflozin Other (See Comments)    Dehydration, uti.   Levofloxacin     Other reaction(s): Arthralgias (intolerance), Myalgias (intolerance)   Aricept  [Donepezil  Hcl] Nausea Only   Bactrim [Sulfamethoxazole-Trimethoprim] Hives   Breztri  Aerosphere [Budeson-Glycopyrrol-Formoterol ] Other (See Comments)    Patient and wife are unsure what happened, however they remember he had a bad reaction   Levofloxacin Other (See Comments)    Muscle tighten up in left and right calf   Misc. Sulfonamide  Containing Compounds Rash     DIAGNOSTIC DATA (LABS, IMAGING, TESTING) - I reviewed patient records, labs, notes, testing and imaging myself where available.  Lab Results  Component Value Date   WBC 9.4 08/08/2023   HGB 11.5 (L) 08/08/2023   HCT 38.1 (L) 08/08/2023   MCV 77.3 (L) 08/08/2023   PLT 288 08/08/2023      Component Value Date/Time   NA 138 08/08/2023 1059   NA 141 06/11/2023 1442   K 3.7 08/08/2023 1059   CL 102 08/08/2023 1059   CO2 26 08/08/2023 1059   GLUCOSE 198 (H) 08/08/2023 1059   BUN 46 (H) 08/08/2023 1059   BUN 58 (H) 06/11/2023 1442   CREATININE 1.56 (H) 08/08/2023 1059   CALCIUM  9.4 08/08/2023 1059  PROT 7.2 08/08/2023 1059   PROT 6.6 10/18/2020 1033   ALBUMIN 4.2 08/08/2023 1059   ALBUMIN 4.0 10/18/2020 1033   AST 24 08/08/2023 1059   ALT 26 08/08/2023 1059   ALKPHOS 47 08/08/2023 1059   BILITOT 0.4 08/08/2023 1059   GFRNONAA 47 (L) 08/08/2023 1059   GFRAA >60 12/27/2018 0653   Lab Results  Component Value Date   CHOL 233 (H) 05/13/2021   HDL 40 05/13/2021   LDLCALC 124 (H) 05/13/2021   TRIG 386 (H) 05/13/2021   CHOLHDL 5.8 (H) 05/13/2021   Lab Results  Component Value Date   HGBA1C 9.0 (H) 07/03/2023   No results found for: "VITAMINB12" Lab Results  Component Value Date   TSH 1.640 10/18/2020     30 points. Still reports headaches in cycles. History of hypoxia and sleep apnea, AHI 37/ h by HST on 10-03-2018.    Echo report and neurocognitive repot reviewed and MRI/PET scan reviewed.     IMPRESSIONS Left ventricular ejection fraction, by estimation, is 35 to 40%. The left ventricle has moderately decreased function. The left ventricle demonstrates global hypokinesis. Left ventricular diastolic parameters are consistent with Grade I diastolic dysfunction (impaired relaxation). 1. Right ventricular systolic function is low normal. The right ventricular size is normal. There is normal pulmonary artery systolic pressure. The  estimated right ventricular systolic pressure is 28.8 mmHg. 2. 3. The mitral valve is grossly normal. Trivial mitral valve regurgitation.  PHYSICAL EXAM:  208 pounds at 5.8"   There were no vitals filed for this visit. There is no height or weight on file to calculate BMI.   Wt Readings from Last 3 Encounters:  08/10/23 208 lb 9.6 oz (94.6 kg)  08/08/23 209 lb 4.8 oz (94.9 kg)  08/03/23 206 lb (93.4 kg)     Ht Readings from Last 3 Encounters:  08/10/23 5\' 8"  (1.727 m)  08/08/23 5\' 8"  (1.727 m)  08/03/23 5\' 8"  (1.727 m)      General: The patient is awake, alert and appears not in acute distress. The patient is well groomed. Head: Normocephalic, atraumatic. Neck is supple.  Mallampati 3, pale  neck circumference:16  inches . Nasal airflow fully patent.   Overbite Myrle Aspen is not seen.  Dental status: poor Cardiovascular:  Regular rate and cardiac rhythm by pulse,  without distended neck veins. Respiratory: Lungs are clear to auscultation.  Skin:  Skin:  evidence of ankle edema, or rash. Coarse skin.  Trunk: The patient's posture is erect.  raspy voice, hoarseness, dysphonia.  Neurologic exam : The patient is not fully alert-, oriented to place and time.   Memory subjective described as impaired-  Mood and affect are appropriate, fatigued.    Cranial nerves: no loss of smell or taste reported  Pupils are equal and briskly reactive to light. Funduscopic exam :  Extraocular movements - dysconjugate gaze, left eye vision decreased, macular degeneration and " lazy eye" , left eye deviated up and out on straight gaze.  Hearing was impaired.   Facial sensation intact to fine touch.  Facial motor strength is symmetric and tongue was midline.  Neck ROM : rotation, tilt and flexion extension were normal for age and shoulder shrug was symmetrical.    Motor exam:  Symmetric bulk, tone and ROM.   Normal tone without cog wheeling, symmetric grip strength .    Coordination:  Rapid alternating movements in the fingers/hands were of reduced speed.  The Finger-to-nose maneuver was intact .   Gait  and station: Patient could rise unassisted from a seated position, walked without assistive device.  Stance is of normal width/ base   Deep tendon reflexes: in the  upper and lower extremities are symmetric and intact.  Babinski response was deferred .    ASSESSMENT AND PLAN 73 y.o. year old male  here with:  Complex sleep apnea with hypoxemia.     1) OSA on CPAP  with oxygen  3 l/m - ordered by pulmonology, nasal pillow user now.  He is using a ResMed s84, only 73 years old.   Now dx with hypoxemia due to PE, DVT, Pneumonia and he has is oxygen  needs met by pulmonology.  He remains fatigued.  He refused to work on a depression score.  He is sleepy and he feels he has low stamina.   Has idiopathic cardiomyopathy, Dr Annia Barth , on ENTRESTO .  Stay on CPAP.   Your DM is not well controlled, your EF remains low, your  renal function is impaired. You have  remain on anticoagulation.  You have a tendency to retain fluid.    2) MCI?     ADLs are intact: The activities of daily living checklist he requires help for preparing meals cooking, shopping, housework and laundry but he has not been doing these activities in the past either so this is not a loss of function.  Managing finances has been taken over by his wife.   Auditory and visual sense are challenged.  Left eye vision impaired and he is hard of hearing, this may mimic a higher degree of memory impairment.  He also is unable to multi task and feels confused easily, and there is a lack of attention, too.  I will ask Dr Cheryll Corti to  see him again.  He will need an audiology visit and new brain images, refill on namenda .  His PET and MRI brain were normal in 2022.  MRI will be without contrast due to low renal clearance.   DEPRESSION score was finally obtained and endorsed at 13/ 15 points.  Highly depressed.  Doesn't  want medications.     I plan to follow up through our NP within 8 months.   I would like to thank Avva, Ravisankar, MD and Avva, Ravisankar, Md [address] [place],  [place] [address] for allowing me to meet with and to take care of this pleasant patient.   CC: I will share my notes with Dr Annia Barth.   After spending a total time of  40  minutes face to face and additional time for physical and neurologic examination, review of laboratory studies,  personal review of imaging studies, reports and results of other testing and review of referral information / records as far as provided in visit,   Electronically signed by: Neomia Banner, MD 08/29/2023 9:32 AM  [facility] Sleep Board certified by The [employer] of Sleep Medicine and Diplomate of the [employer] of Sleep Medicine. Board certified In Neurology through the ABPN, Fellow of the [employer] of Neurology.

## 2023-08-29 NOTE — Patient Instructions (Signed)
 Problems With Thinking and Memory (Mild Neurocognitive Disorder): What to Know Mild neurocognitive disorder, formerly known as mild cognitive impairment, is a disorder where your memory doesn't work as well as it should. It may also affect other mental abilities like thinking, communicating, behavior, and being able to finish tasks. These problems can be noticed and measured. But they usually don't stop you from doing daily activities or living on your own. Mild neurocognitive disorder usually happens after 72 years of age. But it can also happen at younger ages. It's not as serious as major neurocognitive disorder, also known as dementia, but it may be the first sign of it. In general, the symptoms of this condition get worse over time. In rare cases, symptoms can get better. What are the causes? This condition may be caused by: Brain disorders like Alzheimer's disease, Parkinson's disease, and other conditions that slowly damage nerve cells. Diseases that affect the blood vessels in the brain and cause small strokes. Certain infections, like HIV. Traumatic brain injury. Other medical conditions, such as brain tumors, underactive thyroid (hypothyroidism), and not having enough vitamin B12. Using certain drugs or medicines. What increases the risk? Being older than 72 years of age. Being male. Having a lower level of education. Diabetes, high blood pressure, high cholesterol, and other conditions that raise the risk for blood vessel diseases. Untreated or undertreated sleep apnea. Having a certain type of gene that can be inherited, or passed down from parent to child. Long-term health problems like heart disease, lung disease, liver disease, kidney disease, or depression. What are the signs or symptoms? Trouble remembering things. You may: Forget names, phone numbers, or details of recent events. Forget about social events and appointments. Often forget where you put your car keys or other  items. Trouble thinking and solving problems. You may have trouble with complex tasks like: Paying bills. Driving in places you don't know well. Trouble communicating. You may have trouble: Finding the right word or naming an object. Forming a sentence that makes sense. Understanding what you read or hear. Changes in your behavior or personality. When this happens, you may: Lose interest in the things you used to enjoy. Avoid being around people. Get angry more easily than usual. Act before thinking. How is this diagnosed? This condition is diagnosed based on: Your symptoms. Your health care provider may ask you and the people you spend time with, like family and friends, about your symptoms. Memory tests and other tests to check how your brain is working. Your provider may refer you to a provider called a neurologist or a mental health specialist. To try to find out the cause of your condition, your provider may: Get a detailed medical history. Ask about use of alcohol, drugs, and medicines. Do a physical exam. Order blood tests and brain imaging tests. How is this treated? Mild neurocognitive disorder that's caused by medicine use, drug use, infection, or another medical condition may get better when the cause is treated, or when medicines or drugs are stopped. If this disorder has another cause, it usually doesn't improve and may get worse. In these cases, the goal of treatment is to help you manage the symptoms. This may include: Medicines to help with memory and behavior symptoms. Talk therapy. This provides education, emotional support, memory aids, and other ways of making up for problems with mental tasks. Lifestyle changes. These may include: Getting regular exercise. Eating a healthy diet that includes omega-3 fatty acids. Doing things to challenge your thinking  and memory skills. Spending more time being with and talking to other people. Using routines like having regular  times for meals and going to bed. Follow these instructions at home: Eating and drinking  Drink more fluids as told. Eat a healthy diet that includes omega-3 fatty acids. These can be found in: Fish. Nuts. Leafy vegetables. Vegetable oils. If you drink alcohol: Limit how much you have to: 0-1 drink a day if you're male. 0-2 drinks a day if you're male. Know how much alcohol is in your drink. In the [place] one drink is one 12 oz bottle of beer (355 mL), one 5 oz glass of wine (148 mL), or one 1 oz glass of hard liquor (44 mL). Lifestyle  Get regular exercise as told by your provider. Do not smoke, vape, or use nicotine or tobacco. Use healthy ways to manage stress. If you need help managing stress, ask your provider. Keep spending time with other people. Keep your mind active by doing activities you enjoy, like reading or playing games. Make sure you get good sleep at night. These tips can help: Try not to take naps during the day. Keep your bedroom dark and cool. Do not exercise in the few hours before you go to bed. Do not have foods or drinks with caffeine at night. General instructions Take medicines only as told. Your provider may tell you to avoid taking medicines that can affect thinking. These include some medicines for pain or sleeping. Work with your provider to find out: What things you need help with. What your safety needs are. Where to find more information [employer] on Aging: [email or web address] Contact a health care provider if: You have any new symptoms. Get help right away if: You have new confusion or your confusion gets worse. You act in ways that put you or your family in danger. This information is not intended to replace advice given to you by your health care provider. Make sure you discuss any questions you have with your health care provider. Document Revised: 10/17/2022 Document Reviewed: 10/17/2022 Elsevier Patient Education  2024 [employer].

## 2023-08-29 NOTE — Telephone Encounter (Signed)
 no auth required sent to GI [phone]

## 2023-09-02 DIAGNOSIS — G4734 Idiopathic sleep related nonobstructive alveolar hypoventilation: Secondary | ICD-10-CM | POA: Diagnosis not present

## 2023-09-04 DIAGNOSIS — R053 Chronic cough: Secondary | ICD-10-CM | POA: Diagnosis not present

## 2023-09-04 DIAGNOSIS — R490 Dysphonia: Secondary | ICD-10-CM | POA: Diagnosis not present

## 2023-09-05 DIAGNOSIS — R3914 Feeling of incomplete bladder emptying: Secondary | ICD-10-CM | POA: Diagnosis not present

## 2023-09-05 DIAGNOSIS — N281 Cyst of kidney, acquired: Secondary | ICD-10-CM | POA: Diagnosis not present

## 2023-09-05 DIAGNOSIS — N401 Enlarged prostate with lower urinary tract symptoms: Secondary | ICD-10-CM | POA: Diagnosis not present

## 2023-09-13 DIAGNOSIS — I509 Heart failure, unspecified: Secondary | ICD-10-CM | POA: Diagnosis not present

## 2023-09-13 DIAGNOSIS — R0609 Other forms of dyspnea: Secondary | ICD-10-CM | POA: Diagnosis not present

## 2023-09-14 ENCOUNTER — Telehealth: Payer: Self-pay

## 2023-09-14 NOTE — Telephone Encounter (Signed)
 Spoke to patient's wife, Diane.  Offered 5/12 but patient's wife stated that she cannot make appt on short notice.  Would like to see Dr. Susi Eric.  Will wait until Dr. Susi Eric has something later in the day for aortogram.

## 2023-09-17 ENCOUNTER — Ambulatory Visit
Admission: RE | Admit: 2023-09-17 | Discharge: 2023-09-17 | Disposition: A | Discharge status: Home / Self Care | Source: Ambulatory Visit | Attending: Neurology | Admitting: Neurology

## 2023-09-17 DIAGNOSIS — G3184 Mild cognitive impairment, so stated: Secondary | ICD-10-CM | POA: Diagnosis not present

## 2023-09-17 DIAGNOSIS — G4734 Idiopathic sleep related nonobstructive alveolar hypoventilation: Secondary | ICD-10-CM | POA: Diagnosis not present

## 2023-09-17 DIAGNOSIS — H900 Conductive hearing loss, bilateral: Secondary | ICD-10-CM

## 2023-09-17 DIAGNOSIS — G4733 Obstructive sleep apnea (adult) (pediatric): Secondary | ICD-10-CM | POA: Diagnosis not present

## 2023-09-18 ENCOUNTER — Ambulatory Visit: Payer: Self-pay | Admitting: Neurology

## 2023-09-27 ENCOUNTER — Telehealth: Payer: Self-pay | Admitting: Neurology

## 2023-09-27 ENCOUNTER — Telehealth: Payer: Self-pay

## 2023-09-27 NOTE — Telephone Encounter (Signed)
 Pt's wife reports they are still waiting to be contacted re: the scheduling of the memory testing for pt.

## 2023-09-27 NOTE — Telephone Encounter (Signed)
 Called  [facility] Regarding Pt Referral . Office is closed .

## 2023-09-27 NOTE — Telephone Encounter (Signed)
 Offered 6/2 for arrival time of 7a.  Patient's wife stated that they still need something later.  Will continue to watch for later time for patient.

## 2023-09-28 ENCOUNTER — Other Ambulatory Visit: Payer: Self-pay

## 2023-09-28 DIAGNOSIS — G4733 Obstructive sleep apnea (adult) (pediatric): Secondary | ICD-10-CM | POA: Diagnosis not present

## 2023-09-28 DIAGNOSIS — G4734 Idiopathic sleep related nonobstructive alveolar hypoventilation: Secondary | ICD-10-CM | POA: Diagnosis not present

## 2023-09-28 DIAGNOSIS — I724 Aneurysm of artery of lower extremity: Secondary | ICD-10-CM

## 2023-09-28 DIAGNOSIS — G4736 Sleep related hypoventilation in conditions classified elsewhere: Secondary | ICD-10-CM | POA: Diagnosis not present

## 2023-10-02 ENCOUNTER — Encounter: Payer: Self-pay | Admitting: Vascular Surgery

## 2023-10-02 DIAGNOSIS — G4734 Idiopathic sleep related nonobstructive alveolar hypoventilation: Secondary | ICD-10-CM | POA: Diagnosis not present

## 2023-10-05 ENCOUNTER — Ambulatory Visit: Admitting: Pulmonary Disease

## 2023-10-05 ENCOUNTER — Encounter: Payer: Self-pay | Admitting: Pulmonary Disease

## 2023-10-05 VITALS — BP 117/73 | HR 95 | Ht 68.0 in | Wt 208.8 lb

## 2023-10-05 DIAGNOSIS — R053 Chronic cough: Secondary | ICD-10-CM | POA: Diagnosis not present

## 2023-10-05 MED ORDER — PREDNISONE 20 MG PO TABS: ORAL_TABLET | ORAL | 0 refills | Status: AC

## 2023-10-05 NOTE — Patient Instructions (Signed)
 I sent a referral to the ENT specialist, laryngologist, to evaluate the vocal cords also help with the cough  It sounds like postnasal drip is a new development with a lot of mucus, use Flonase  1 sprays nostril twice a day.  Take prednisone  as prescribed.  See if this helps with mucus production and cough.  No other changes to medicines today  Return to clinic in 3 months or sooner as needed with Dr. Marygrace Snellen

## 2023-10-05 NOTE — Progress Notes (Signed)
 @Patient  ID: Donald [name], male    DOB: 1951-04-27, 73 y.o.   MRN: [id number]  Chief Complaint  Patient presents with   Follow-up    Patient states breathing and coughing has not improvement.    Referring provider: Avva, Ravisankar, MD  HPI:   73 y.o. man with chronic cough and chronic dyspnea on exertion related to heart failure with clear chest imaging and worsened hypoxemia 06/2023 in setting of acute PE.  Most recent cardiology note reviewed.  Seen for hypoxemia much improved..  With treatment of PE.  Not using oxygen  today.  He complains of cough.  This is going on for years.  Recently changed with worsening mucus.  Some posttussive syncope or presyncope described.  We reviewed that we have tried maximal medical therapies for asthma without improvement.  PFTs clear.  Not much more we can offer from a asthma standpoint as I do not think he really has asthma.  New symptoms of postnasal drip, runny nose.  Mucus accumulation in the morning with lots of coughing fits.  Suspect accumulation of mucus when sleeping.  Worsening chronic cough.  We discussed tragedies for this.  We discussed in the past gabapentin  has helped but he is not tolerant of this due to side effects, feeling woozy, lightheaded memory issues, cognitive deficits.  In addition he had urinary symptoms, as well.  HPI initial visit: Long issue with chronic cough.  Tried multiple things.  Was seen a couple weeks ago video visit chiefly with shortness of breath.  They state cough is improved.  Despite this.  Recommendations made for trying gabapentin .  Again cough improved prior to this.  Took it for few days and that made him feel lightheaded woozy not quite himself.  Similar issues in the past.  This was discontinued.  Which I agree with.  He continues with severe shortness of breath.  Today after virtual visit went to ED.  Chest x-ray on my review interpretation is clear.  CTA PE protocol my review interpretation is clear  with no evidence of PE.  He is tried Atrovent  prescribed by Irby Mannan, NP at last visit.  No improvement.  Through the course of this got a round of azithromycin  as well as prednisone .  No improvement with this.  Seen by his cardiology team, NP in the interim.  Placed on Lasix  for a few days.  His weight is down about 5 pounds by scales here.  He says at home his weight is down a little bit more.  Regardless, no real improvement in the symptoms despite taking Lasix .  Sounds like they were concerned about fluid accumulation in his abdomen.  We discussed at length with normal chest imaging and prior normal PFTs that lung is likely not the culprit here.  In addition, if lungs were the culprit asthma will be the most likely thing given normal PFTs and normal chest imaging.  However he is not improved with inhalers in the past, short acting bronchodilators with current symptoms, nor with prednisone  or antibiotics.  Discussed further evaluation with PFTs although in the past these been reassuring.  Discussed messaging cardiology team for further evaluation and assistance.  Discussed trying an inhaler just in case.  Questionaires / Pulmonary Flowsheets:   ACT:      No data to display          MMRC:     No data to display          Epworth:  No data to display          Tests:   FENO:  No results found for: "NITRICOXIDE"  PFT:    Latest Ref Rng & Units 04/20/2023   11:26 AM 02/11/2021   11:56 AM 06/17/2014    2:21 PM  PFT Results  FVC-Pre L 3.05  2.81  3.30   FVC-Predicted Pre % 80  67  75   FVC-Post L 3.19  3.04  3.49   FVC-Predicted Post % 83  73  79   Pre FEV1/FVC % % 74  73  73   Post FEV1/FCV % % 76  76  77   FEV1-Pre L 2.25  2.05  2.42   FEV1-Predicted Pre % 80  66  73   FEV1-Post L 2.43  2.32  2.69   DLCO uncorrected ml/min/mmHg 18.34  19.80  21.18   DLCO UNC% % 79  80  71   DLCO corrected ml/min/mmHg 20.63  19.80    DLCO COR %Predicted % 89  80    DLVA  Predicted % 102  100  94   TLC L 5.81  4.54  5.18   TLC % Predicted % 92  68  78   RV % Predicted % 112  78  83   Personally viewed interpreted as normal spirometry, no significant bronchodilator response, lung volumes indicative of mild air trapping, DLCO within normal limits.  WALK:     07/10/2023   10:00 AM 03/30/2021    1:12 PM 06/11/2014    5:29 PM 08/18/2011    4:37 PM  SIX MIN WALK  Supplimental Oxygen  during Test? (L/min) Yes No No No  O2 Flow Rate 4 L/min     Type Pulse     Tech Comments: Pt qualified for 4L POC & 2L continuous walked at a steady pace with no breaks  test completed/pt's o2 sats drop but he quickly recovers//mmr    Imaging: Personally reviewed and as per EMR and discussion in this note MR BRAIN WO CONTRAST Result Date: 09/17/2023  [facility] [address] [place], [place] [address] [phone] NEUROIMAGING REPORT STUDY DATE: 09/17/2023 PATIENT NAME: Donald [name] DOB: 06/15/1950 MRN: [id number] EXAM: MRI Brain without contrast ORDERING CLINICIAN: Raoul Byes Dohmeier MD CLINICAL HISTORY: 73 year old man with MCI COMPARISON FILMS: 05/15/2020 TECHNIQUE: MRI of the brain without contrast was obtained utilizing 5 mm axial slices with T1, T2, T2 flair, SWI and diffusion weighted views.  T1 sagittal and T2 coronal views were obtained. CONTRAST: none IMAGING SITE: [facility], [address] [place], [place] FINDINGS: On sagittal images, the spinal cord is imaged caudally to C4 and is normal in caliber.  Metal artifact from ACDF is noted at C3-C4 the contents of the posterior fossa are of normal size and position.   The pituitary gland and optic chiasm appear normal.    Mild generalized cortical atrophy, normal for age.  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 In the cerebral hemispheres, there are scattered T2/FLAIR hyperintense foci predominantly in the subcortical, juxtacortical and deep white matter.  None of these foci appear to be  acute.  However, there has been some progression compared to the MRI from 05/15/2020 the cerebellum and brainstem appears normal.   The deep gray matter appears normal.   Diffusion weighted images are normal.  Susceptibility weighted images are normal.  There have been bilateral lens replacements.  Otherwise, the orbits appear normal.   The VIIth/VIIIth nerve complex appears normal.  The mastoid air cells appear normal.  Minimal mucoperiosteal thickening is noted in the maxillary sinuses and some of the ethmoid air cells.  The other paranasal sinuses appear normal.  Flow voids are identified within the major intracerebral arteries.     This MRI of the brain without contrast shows the following: Mild generalized cortical atrophy, normal for age. Scattered T2/FLAIR hyperintense foci consistent with mild chronic microvascular ischemic change.  None of the foci appear to be acute though there was some progression compared to the MRI from 05/15/2020. Scaphocephaly is noted No acute findings INTERPRETING PHYSICIAN: Richard A. Sater, MD, PhD, FAAN Certified in  Neuroimaging by [employer]     Lab Results: Personally reviewed, no significant anemia recently CBC    Component Value Date/Time   WBC 9.4 08/08/2023 1059   WBC 9.4 07/04/2023 0437   RBC 4.93 08/08/2023 1059   HGB 11.5 (L) 08/08/2023 1059   HGB 12.2 (L) 06/11/2023 1442   HCT 38.1 (L) 08/08/2023 1059   HCT 39.4 06/11/2023 1442   PLT 288 08/08/2023 1059   PLT 276 06/11/2023 1442   MCV 77.3 (L) 08/08/2023 1059   MCV 76 (L) 06/11/2023 1442   MCH 23.3 (L) 08/08/2023 1059   MCHC 30.2 08/08/2023 1059   RDW 18.7 (H) 08/08/2023 1059   RDW 18.0 (H) 06/11/2023 1442   LYMPHSABS 1.6 08/08/2023 1059   LYMPHSABS 1.9 10/18/2020 1033   MONOABS 0.6 08/08/2023 1059   EOSABS 0.5 08/08/2023 1059   EOSABS 0.4 10/18/2020 1033   BASOSABS 0.0 08/08/2023 1059   BASOSABS 0.1 10/18/2020 1033    BMET    Component Value Date/Time   NA 138  08/08/2023 1059   NA 141 06/11/2023 1442   K 3.7 08/08/2023 1059   CL 102 08/08/2023 1059   CO2 26 08/08/2023 1059   GLUCOSE 198 (H) 08/08/2023 1059   BUN 46 (H) 08/08/2023 1059   BUN 58 (H) 06/11/2023 1442   CREATININE 1.56 (H) 08/08/2023 1059   CALCIUM  9.4 08/08/2023 1059   GFRNONAA 47 (L) 08/08/2023 1059   GFRAA >60 12/27/2018 0653    BNP    Component Value Date/Time   BNP 40.3 07/02/2023 1749    ProBNP    Component Value Date/Time   PROBNP 46 12/06/2022 1436   PROBNP 7.0 03/30/2021 1147    Specialty Problems       Pulmonary Problems   Seasonal allergic rhinitis due to pollen   SOB (shortness of breath)   CXR 07/2011:  Clear.  Echo 07/2011:  Mild diffuse HK with EF 50-55%.  Otherwise ok.  PFT"s 08/2011:  Totally normal.       Nocturnal hypoxemia   ONO 08/2011:  Low sat 82%, 8 hrs less than or equal to 88%. Ambulatory ox 08/2011:  desat 86-88%, but recovered very quickly      OSA (obstructive sleep apnea)   NPSG 10/2011:  AHI 15/hr with desat to 80%.  Spent 194 min less than 88% during the night.       Pneumonia, interstitial (HCC)   Mild intermittent asthma without complication   Hypoxemia requiring supplemental oxygen    OSA on CPAP   Pneumonitis   Chronic cough   Chronic sinusitis with recurrent bronchitis   Dyspnea    Allergies  Allergen Reactions   Ciprofloxacin     Tendons problem in shoulder Other reaction(s): Arthralgias (intolerance), Myalgias (intolerance)   Jardiance [Empagliflozin] Other (See Comments)    Dehydration, uti.   Levaquin [Levofloxacin]  Other reaction(s): Arthralgias (intolerance), Myalgias (intolerance)   Aricept  [Donepezil  Hcl] Nausea Only   Bactrim [Sulfamethoxazole-Trimethoprim] Hives   Breztri  Aerosphere [Budeson-Glycopyrrol-Formoterol ] Other (See Comments)    Patient and wife are unsure what happened, however they remember he had a bad reaction   Misc. Sulfonamide Containing Compounds Rash    Immunization History   Administered Date(s) Administered   Influenza Split 02/05/2014   Influenza, Quadrivalent, Recombinant, Inj, Pf 01/16/2018, 03/06/2019, 02/25/2020, 02/23/2021   Influenza-Unspecified 03/09/2009, 02/08/2010, 02/22/2011, 01/15/2012, 03/05/2013, 01/15/2014, 02/05/2014, 03/23/2015, 03/14/2016, 03/16/2017   Pneumococcal Conjugate-13 04/06/2014, 08/29/2017   Pneumococcal Polysaccharide-23 05/08/2006   Pneumococcal-Unspecified 03/08/2014   Tdap 07/10/2008   Zoster, Live 01/30/2014    Past Medical History:  Diagnosis Date   Allergic rhinitis, seasonal    Asthma    Bronchitis    CHF (congestive heart failure) (HCC)    Chronic kidney disease, stage I    Cognitive impairment    Effects Short Term Memory   collar bone    dislocation left side 05/2014   Degenerative joint disease of knee    bilateral   Diabetes mellitus    Type 2   Dysrhythmia    PVCs   Fatty liver    GERD (gastroesophageal reflux disease)    Headache    Heachaces r/t eyes   Heart murmur    history of   History of acute prostatitis    History of hematuria    History of hiatal hernia    H/O   Hyperlipidemia    Hypertension    IgA nephropathy    OSA on CPAP    Pneumonia    Sinusitis     Tobacco History: Social History   Tobacco Use  Smoking Status Never  Smokeless Tobacco Never  Tobacco Comments   Second hand smoke for 26 years per pt   Counseling given: Not Answered Tobacco comments: Second hand smoke for 26 years per pt   Continue to not smoke  Outpatient Encounter Medications as of 10/05/2023  Medication Sig   predniSONE  (DELTASONE ) 20 MG tablet Take 2 tablets (40 mg total) by mouth daily with breakfast for 5 days, THEN 1 tablet (20 mg total) daily with breakfast for 5 days.   apixaban  (ELIQUIS ) 5 MG TABS tablet Take 2 tablets (10mg ) by mouth twice daily for 7 days, then switch to 1 tablet (5mg ) by mouth twice daily. (Patient taking differently: Take 5 mg by mouth 2 (two) times daily.)   Continuous  Blood Gluc Sensor (FREESTYLE LIBRE 14 DAY SENSOR) MISC Inject 1 Application into the skin every 14 (fourteen) days.   finasteride  (PROSCAR ) 5 MG tablet Take 5 mg by mouth in the morning.   fluticasone  (FLONASE ) 50 MCG/ACT nasal spray Place 1 spray into both nostrils daily as needed for allergies.   furosemide  (LASIX ) 40 MG tablet Take 40 mg by mouth daily.   insulin  lispro (HUMALOG  KWIKPEN) 200 UNIT/ML KwikPen Inject 30-130 Units into the skin in the morning, at noon, and at bedtime. Sliding scale If blood sugar is over 140   memantine  (NAMENDA ) 10 MG tablet Take 1 tablet (10 mg total) by mouth 2 (two) times daily.   metFORMIN  (GLUCOPHAGE ) 1000 MG tablet Take 1 tablet (1,000 mg total) by mouth 2 (two) times daily with a meal. Restart on 8/22   metoprolol  succinate (TOPROL -XL) 50 MG 24 hr tablet Take 50 mg by mouth 2 (two) times daily. Take with or immediately following a meal.   pantoprazole  (PROTONIX ) 40 MG tablet Take 1  tablet (40 mg total) by mouth 2 (two) times daily. (Patient taking differently: Take 40 mg by mouth every evening.)   rosuvastatin  (CRESTOR ) 20 MG tablet TAKE ONE TABLET BY MOUTH ONE TIME DAILY (Patient taking differently: Take 20 mg by mouth every evening.)   sacubitril -valsartan  (ENTRESTO ) 97-103 MG TAKE ONE TABLET BY MOUTH TWICE DAILY   silodosin (RAPAFLO) 8 MG CAPS capsule Take 8 mg by mouth every evening.   spironolactone  (ALDACTONE ) 25 MG tablet Take one-half tablet by mouth daily   TRESIBA FLEXTOUCH 200 UNIT/ML FlexTouch Pen Inject 100 Units into the skin in the morning.   No facility-administered encounter medications on file as of 10/05/2023.     Review of Systems  Review of Systems  N/a Physical Exam  BP 117/73 (BP Location: Left Arm, Patient Position: Sitting, Cuff Size: Normal)   Pulse 95   Ht 5\' 8"  (1.727 m)   Wt 208 lb 12.8 oz (94.7 kg)   SpO2 94%   BMI 31.75 kg/m   Wt Readings from Last 5 Encounters:  10/05/23 208 lb 12.8 oz (94.7 kg)  08/10/23 208  lb 9.6 oz (94.6 kg)  08/08/23 209 lb 4.8 oz (94.9 kg)  08/03/23 206 lb (93.4 kg)  07/20/23 205 lb 11.2 oz (93.3 kg)    BMI Readings from Last 5 Encounters:  10/05/23 31.75 kg/m  08/10/23 31.72 kg/m  08/08/23 31.82 kg/m  08/03/23 31.32 kg/m  07/20/23 31.28 kg/m     Physical Exam General: Sitting in chair, no acute distress Eyes: EOMI, no icterus Neck: Supple, no JVP Cardiovascular: Warm, no edema noted Pulmonary: Clear, normal work of breathing Abdomen: Nondistended, bowel sounds present MSK: No synovitis, no joint effusion Neuro: Normal gait, no weakness Psych: Normal mood, full affect   Assessment & Plan:   Dyspnea on exertion: Relatively sudden onset.  Initial chest imaging including PE protocol CTA clear and no evidence of PE.  No improvement with asthma therapies such as prednisone  and inhalers.  No improvement with antibiotics.  Prior PFTs in the past reassuring.  Low suspicion for pulmonary driver.  No improvement with maximal inhaled therapy, ICS/LABA LAMA.  Repeat PFTs within normal limits.  Suspect cardiac drivers of chronic baseline dyspnea on exertion as well as element of deconditioning.  Acute on chronic cough: Chest imaging clear.  PFTs within normal is.  No signs of air trapping or subtle signs of asthma.  Has tried maximal inhaler therapy for asthma without improvement.  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  Gabapentin  has helped but he is intolerant to this due to side effects of neurologic symptoms, urinary symptoms.  ENT doctor wanted him to see a laryngologist, it seems like no referral was sent.  Referral to l[facility]ologist at [facility] today.  Suspect acute worsening related to postnasal drip given description of mucus production, mucus commotion of throat in the mornings, rhinorrhea etc. encouraged him to use Flonase  1 spray each nostril twice a day, continue antihistamine at night.  Prednisone  taper.  Further  recommendations per ENT.  Return in about 3 months (around 01/05/2024) for f/u Dr. Marygrace Snellen.   Guerry Leek, MD 10/05/2023   This appointment required 40 minutes of patient care (this includes precharting, chart review, review of results, face-to-face care, etc.).

## 2023-10-08 ENCOUNTER — Other Ambulatory Visit: Payer: Self-pay

## 2023-10-08 ENCOUNTER — Ambulatory Visit (HOSPITAL_COMMUNITY)
Admission: RE | Admit: 2023-10-08 | Discharge: 2023-10-08 | Disposition: A | Discharge status: Home / Self Care | Attending: Vascular Surgery | Admitting: Vascular Surgery

## 2023-10-08 ENCOUNTER — Encounter (HOSPITAL_COMMUNITY)
Admission: RE | Disposition: A | Discharge status: Home / Self Care | Payer: Self-pay | Source: Home / Self Care | Attending: Vascular Surgery

## 2023-10-08 ENCOUNTER — Encounter (HOSPITAL_COMMUNITY): Payer: Self-pay | Admitting: Vascular Surgery

## 2023-10-08 DIAGNOSIS — Z79899 Other long term (current) drug therapy: Secondary | ICD-10-CM | POA: Insufficient documentation

## 2023-10-08 DIAGNOSIS — E1122 Type 2 diabetes mellitus with diabetic chronic kidney disease: Secondary | ICD-10-CM | POA: Diagnosis not present

## 2023-10-08 DIAGNOSIS — N181 Chronic kidney disease, stage 1: Secondary | ICD-10-CM | POA: Diagnosis not present

## 2023-10-08 DIAGNOSIS — Z7984 Long term (current) use of oral hypoglycemic drugs: Secondary | ICD-10-CM | POA: Insufficient documentation

## 2023-10-08 DIAGNOSIS — I130 Hypertensive heart and chronic kidney disease with heart failure and stage 1 through stage 4 chronic kidney disease, or unspecified chronic kidney disease: Secondary | ICD-10-CM | POA: Diagnosis not present

## 2023-10-08 DIAGNOSIS — I724 Aneurysm of artery of lower extremity: Secondary | ICD-10-CM

## 2023-10-08 DIAGNOSIS — Z794 Long term (current) use of insulin: Secondary | ICD-10-CM | POA: Diagnosis not present

## 2023-10-08 DIAGNOSIS — I509 Heart failure, unspecified: Secondary | ICD-10-CM | POA: Insufficient documentation

## 2023-10-08 HISTORY — PX: LOWER EXTREMITY ANGIOGRAPHY: CATH118251

## 2023-10-08 HISTORY — PX: ABDOMINAL AORTOGRAM: CATH118222

## 2023-10-08 HISTORY — PX: LOWER EXTREMITY INTERVENTION: CATH118252

## 2023-10-08 LAB — POCT I-STAT, CHEM 8
BUN: 54 mg/dL — ABNORMAL HIGH (ref 8–23)
Calcium, Ion: 1.17 mmol/L (ref 1.15–1.40)
Chloride: 107 mmol/L (ref 98–111)
Creatinine, Ser: 1.7 mg/dL — ABNORMAL HIGH (ref 0.61–1.24)
Glucose, Bld: 276 mg/dL — ABNORMAL HIGH (ref 70–99)
HCT: 37 % — ABNORMAL LOW (ref 39.0–52.0)
Hemoglobin: 12.6 g/dL — ABNORMAL LOW (ref 13.0–17.0)
Potassium: 4 mmol/L (ref 3.5–5.1)
Sodium: 142 mmol/L (ref 135–145)
TCO2: 25 mmol/L (ref 22–32)

## 2023-10-08 LAB — GLUCOSE, CAPILLARY: Glucose-Capillary: 266 mg/dL — ABNORMAL HIGH (ref 70–99)

## 2023-10-08 MED ORDER — LIDOCAINE HCL (PF) 1 % IJ SOLN
INTRAMUSCULAR | Status: AC
  Filled 2023-10-08: qty 30

## 2023-10-08 MED ORDER — LIDOCAINE HCL (PF) 1 % IJ SOLN
INTRAMUSCULAR | Status: DC | PRN
  Administered 2023-10-08: 15 mL via INTRADERMAL

## 2023-10-08 MED ORDER — CLOPIDOGREL BISULFATE 300 MG PO TABS
ORAL_TABLET | ORAL | Status: DC | PRN
  Administered 2023-10-08: 300 mg via ORAL

## 2023-10-08 MED ORDER — CLOPIDOGREL BISULFATE 75 MG PO TABS: 75.0000 mg | ORAL_TABLET | Freq: Every day | ORAL | 0 refills | Status: DC

## 2023-10-08 MED ORDER — IODIXANOL 320 MG/ML IV SOLN
INTRAVENOUS | Status: DC | PRN
  Administered 2023-10-08: 45 mL

## 2023-10-08 MED ORDER — SODIUM CHLORIDE 0.9 % IV SOLN: INTRAVENOUS | Status: DC

## 2023-10-08 MED ORDER — HEPARIN SODIUM (PORCINE) 1000 UNIT/ML IJ SOLN
INTRAMUSCULAR | Status: AC
Start: 2023-10-08 — End: ?
  Filled 2023-10-08: qty 10

## 2023-10-08 MED ORDER — SODIUM CHLORIDE 0.9 % WEIGHT BASED INFUSION: 1.0000 mL/kg/h | INTRAVENOUS | Status: DC

## 2023-10-08 MED ORDER — HYDRALAZINE HCL 20 MG/ML IJ SOLN: 5.0000 mg | INTRAMUSCULAR | Status: DC | PRN

## 2023-10-08 MED ORDER — CLOPIDOGREL BISULFATE 300 MG PO TABS
ORAL_TABLET | ORAL | Status: AC
  Filled 2023-10-08: qty 1

## 2023-10-08 MED ORDER — LABETALOL HCL 5 MG/ML IV SOLN: 10.0000 mg | INTRAVENOUS | Status: DC | PRN

## 2023-10-08 MED ORDER — HEPARIN SODIUM (PORCINE) 1000 UNIT/ML IJ SOLN
INTRAMUSCULAR | Status: DC | PRN
  Administered 2023-10-08: 9000 [IU] via INTRAVENOUS

## 2023-10-08 MED ORDER — ACETAMINOPHEN 325 MG PO TABS: 650.0000 mg | ORAL_TABLET | ORAL | Status: DC | PRN

## 2023-10-08 MED ORDER — ONDANSETRON HCL 4 MG/2ML IJ SOLN: 4.0000 mg | Freq: Four times a day (QID) | INTRAMUSCULAR | Status: DC | PRN

## 2023-10-08 MED ORDER — HEPARIN (PORCINE) IN NACL 1000-0.9 UT/500ML-% IV SOLN
INTRAVENOUS | Status: DC | PRN
  Administered 2023-10-08 (×2): 500 mL

## 2023-10-08 NOTE — H&P (Signed)
 Patient seen and examined.  No complaints. No changes to medication history or physical exam since last seen. After discussing the risks and benefits of LLE Angiogram with popliteal stenting, Donald [name] elected to proceed.   Philipp Brawn MD          Patient ID: Donald [name], male   DOB: 1950/08/16, 73 y.o.   MRN: [id number]   Reason for Consult: Follow-up   Referred by Lonzie Robins, MD   Subjective:    Subjective HPI   Donald [name] is a 72 y.o. male presenting for follow-up of she was incidentally identified popliteal artery aneurysms.  He is planned for a CTA short interval follow-up. He continues to be asymptomatic and denies claudication, rest pain or nonhealing wounds.       Past Medical History:  Diagnosis Date   Allergic rhinitis, seasonal     Asthma     Bronchitis     CHF (congestive heart failure) (HCC)     Chronic kidney disease, stage I     Cognitive impairment      Effects Short Term Memory   collar bone      dislocation left side 05/2014   Degenerative joint disease of knee      bilateral   Diabetes mellitus      Type 2   Dysrhythmia      PVCs   Fatty liver     GERD (gastroesophageal reflux disease)     Headache      Heachaces r/t eyes   Heart murmur      history of   History of acute prostatitis     History of hematuria     History of hiatal hernia      H/O   Hyperlipidemia     Hypertension     IgA nephropathy     OSA on CPAP     Pneumonia     Sinusitis               Family History  Problem Relation Age of Onset   Coronary artery disease Father     Diabetes type II Father     Heart attack Father               Past Surgical History:  Procedure Laterality Date   BIOPSY   05/17/2020    Procedure: BIOPSY;  Surgeon: Ozell Blunt, MD;  Location: [facility];  Service: Endoscopy;;   CARDIAC CATHETERIZATION   09/29/2011    normal   CATARACT EXTRACTION Bilateral     CERVICAL SPINE SURGERY       COLONOSCOPY  WITH PROPOFOL  N/A 05/17/2020    Procedure: COLONOSCOPY WITH PROPOFOL ;  Surgeon: Ozell Blunt, MD;  Location: [facility];  Service: Endoscopy;  Laterality: N/A;   deviated septum        1990s   ESOPHAGOGASTRODUODENOSCOPY (EGD) WITH PROPOFOL  N/A 05/17/2020    Procedure: ESOPHAGOGASTRODUODENOSCOPY (EGD) WITH PROPOFOL ;  Surgeon: Ozell Blunt, MD;  Location: [facility];  Service: Endoscopy;  Laterality: N/A;   HEMOSTASIS CLIP PLACEMENT   05/17/2020    Procedure: HEMOSTASIS CLIP PLACEMENT;  Surgeon: Ozell Blunt, MD;  Location: [facility];  Service: Endoscopy;;   HERNIA REPAIR       LEFT HEART CATHETERIZATION WITH CORONARY ANGIOGRAM N/A 09/29/2011    Procedure: LEFT HEART CATHETERIZATION WITH CORONARY ANGIOGRAM;  Surgeon: Luana Rumple, MD;  Location: [facility];  Service: Cardiovascular;  Laterality: N/A;   NM MYOVIEW  LTD   07/08/2011  mild LV dilatation,mild septal hypokinesia   POLYPECTOMY   05/17/2020    Procedure: POLYPECTOMY;  Surgeon: Ozell Blunt, MD;  Location: [facility];  Service: Endoscopy;;   RIGHT HEART CATH N/A 04/17/2023    Procedure: RIGHT HEART CATH;  Surgeon: Arleen Lacer, MD;  Location: [facility] INV[facility] CV LAB;  Service: Cardiovascular;  Laterality: N/A;   TONSILLECTOMY       US  ECHOCARDIOGRAPHY   09/07/2011    EF 35-45%,mod.ant hypokinesis,boderline LA & RA enlargement,trace MR,TR & PI          Short Social History:  Social History         Tobacco Use   Smoking status: Never   Smokeless tobacco: Never   Tobacco comments:      Second hand smoke for 26 years per pt  Substance Use Topics   Alcohol  use: No      Alcohol /week: 0.0 standard drinks of alcohol       Allergies       Allergies  Allergen Reactions   Ciprofloxacin        Tendons problem in shoulder Other reaction(s): Arthralgias (intolerance), Myalgias (intolerance)   Empagliflozin Other (See Comments)      Dehydration, uti.   Levofloxacin        Other reaction(s): Arthralgias (intolerance),  Myalgias (intolerance)   Aricept  [Donepezil  Hcl] Nausea Only   Bactrim [Sulfamethoxazole-Trimethoprim] Hives   Breztri  Aerosphere [Budeson-Glycopyrrol-Formoterol ] Other (See Comments)      Patient and wife are unsure what happened, however they remember he had a bad reaction   Levofloxacin Other (See Comments)      Muscle tighten up in left and right calf   Misc. Sulfonamide Containing Compounds Rash              Current Outpatient Medications  Medication Sig Dispense Refill   apixaban  (ELIQUIS ) 5 MG TABS tablet Take 2 tablets (10mg ) by mouth twice daily for 7 days, then switch to 1 tablet (5mg ) by mouth twice daily. 90 tablet 3   Continuous Blood Gluc Sensor (FREESTYLE LIBRE 14 DAY SENSOR) MISC Inject 1 Application into the skin every 14 (fourteen) days.       DULoxetine  (CYMBALTA ) 30 MG capsule Take 30 mg by mouth in the morning.       finasteride  (PROSCAR ) 5 MG tablet Take 5 mg by mouth in the morning.       fluticasone  (FLONASE ) 50 MCG/ACT nasal spray Place 1 spray into both nostrils daily.       furosemide  (LASIX ) 20 MG tablet Take 10 mg by mouth daily.       HYDROcodone -acetaminophen  (NORCO/VICODIN) 5-325 MG tablet Take 1-2 tablets by mouth every 4 (four) hours as needed for moderate pain ((score 4 to 6)). 30 tablet 0   insulin  lispro (HUMALOG  KWIKPEN) 200 UNIT/ML KwikPen Inject 30-130 Units into the skin in the morning, at noon, and at bedtime. Sliding scale If blood sugar is over 140       loratadine  (CLARITIN ) 10 MG tablet Take 10 mg by mouth at bedtime.       memantine  (NAMENDA ) 10 MG tablet Take 1 tablet (10 mg total) by mouth 2 (two) times daily. 180 tablet 3   metFORMIN  (GLUCOPHAGE ) 1000 MG tablet Take 1 tablet (1,000 mg total) by mouth 2 (two) times daily with a meal. Restart on 8/22       metoprolol  succinate (TOPROL -XL) 50 MG 24 hr tablet Take 50 mg by mouth 2 (two) times daily. Take with or immediately following  a meal.       pantoprazole  (PROTONIX ) 40 MG tablet Take 1  tablet (40 mg total) by mouth 2 (two) times daily. 180 tablet 0   rosuvastatin  (CRESTOR ) 20 MG tablet TAKE ONE TABLET BY MOUTH ONE TIME DAILY 90 tablet 3   sacubitril -valsartan  (ENTRESTO ) 97-103 MG TAKE ONE TABLET BY MOUTH TWICE DAILY 180 tablet 3   silodosin (RAPAFLO) 8 MG CAPS capsule Take 8 mg by mouth every evening.       spironolactone  (ALDACTONE ) 25 MG tablet Take one-half tablet by mouth daily 45 tablet 3   TRESIBA FLEXTOUCH 200 UNIT/ML FlexTouch Pen Inject 100 Units into the skin in the morning.          No current facility-administered medications for this visit.        REVIEW OF SYSTEMS    All other systems were reviewed and are negative       Objective:    Objective[] Expand by Default    Vitals:    08/10/23 0842  BP: 112/71  Pulse: 88  Temp: 97.7 F (36.5 C)  Weight: 208 lb 9.6 oz (94.6 kg)  Height: 5\' 8"  (1.727 m)    Body mass index is 31.72 kg/m.   Physical Exam General: no acute distress Cardiac: hemodynamically stable Pulm: normal work of breathing Neuro: alert, no focal deficit Extremities: 1+ edema from ankles to mid shin bilaterally, no cyanosis or wounds  Vascular:              Right: Palpable DP             Left: Palpable DP     Data: CTA independently reviewed No evidence of aortic or femoral aneurysms. Right popliteal artery aneurysm measuring approximately 2.1 with about 50% mural thrombus Left popliteal artery aneurysm measuring about 2.5 with about 50% mural thrombus.   Echo reviewed EF 40 to 45%, grade 1 diastolic dysfunction, RV function normal, no valvular issues   Vein mapping +-------------+-------------+-----------------------+-------------+--------  ----+   RT Diameter  RT Findings           GSV           LT Diameter LT  Findings       (cm)                                             (cm)                    +-------------+-------------+-----------------------+-------------+--------  ----+     0.56                   Saphenofemoral Junction    0.72                    +-------------+-------------+-----------------------+-------------+--------  ----+     0.45                      Proximal thigh         0.54                    +-------------+-------------+-----------------------+-------------+--------  ----+     0.27       branching         Mid thigh           0.51        out  of  fascia     +-------------+-------------+-----------------------+-------------+--------  ----+     0.37     out of fascia     Distal thigh          0.48        out  of                                                                       fascia     +-------------+-------------+-----------------------+-------------+--------  ----+     0.35                           Knee              0.37                    +-------------+-------------+-----------------------+-------------+--------  ----+     0.38                         Prox calf           0.30                    +-------------+-------------+-----------------------+-------------+--------  ----+     0.35                         Mid calf            0.36                    +-------------+-------------+-----------------------+-------------+--------  ----+     0.35                        Distal calf          0.35                    +-------------+-------------+-----------------------+-------------+--------  ----+       Assessment/Plan:    Assessment Donald [name] is a 73 y.o. 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   We discussed that he is over the threshold for treatment and warrants intervention.  I discussed the risk and benefits of both popliteal artery stenting as well as bypass with aneurysm exclusion.  I explained that given his anatomy I believe a stent will work well and  we always have the option for bypass in the future.  I also explained that given his other medical comorbidities including CHF, CKD and diabetes that I would recommend that the minimally invasive option with stenting. He agrees and elects to proceed.   Will plan for left popliteal artery aneurysm stenting in the Cath Lab as an outpatient. Since he was diagnosed with a recent PE with fairly significant clot burden bilaterally we will plan to schedule once he has been treated with [facility] for 3 months as this is not urgent and I explained he has a low likelihood of LE embolization or popliteal thrombosis while on blood thinner. Will plan to schedule for mid to late May and at that time will hold his eliquis  for 2 days prior to the procedure.  Philipp Brawn MD [facility]

## 2023-10-08 NOTE — Op Note (Signed)
    Patient name: Donald [name] MRN: [id number] DOB: 1951/01/31 Sex: male  10/08/2023 Pre-operative Diagnosis: Left popliteal artery aneurysm Post-operative diagnosis:  Same Surgeon:  Philipp Brawn, MD Procedure Performed:  Ultrasound-guided access of right common femoral artery Aortogram left lower extremity angiogram Third order cannulation of left popliteal artery Stenting of left popliteal artery, 8 mm x 5 cm Viabahn, 9 mm x 10 cm Viabahn, 9 mm x 5 cm Viabahn Pro-glide closure of right common femoral artery   Indications: 73 year old male who was incidentally found to have bilateral popliteal artery aneurysms.  The left measuring 2.5 cm.  The right measuring 2.1 cm, both laden with thrombus.  Given his comorbidities I offered endovascular repair rather than open.  Risks and benefits of popliteal artery stenting were reviewed, he and his wife expressed understanding and elects to proceed.  Findings:  Widely patent aorta and bilateral renal arteries.  Widely patent iliac systems bilaterally.  Arteriomegaly.  Long segment aneurysmal disease of the left popliteal artery.   Procedure:  The patient was identified in the holding area and taken to the cath lab  The patient was then placed supine on the table and prepped and draped in the usual sterile fashion.  A time out was called.  Ultrasound was used to evaluate the right common femoral artery.  It was patent .  A digital ultrasound image was acquired.  A micropuncture needle was used to access the right common femoral artery under ultrasound guidance.  An 018 wire was advanced without resistance and a micropuncture sheath was placed.  The 018 wire was removed and a benson wire was placed.  The micropuncture sheath was exchanged for a 5 french sheath.  An omniflush catheter was advanced over the wire to the level of L-1.  An abdominal angiogram was obtained.  Next, using the omniflush catheter and a Bentson wire, the aortic bifurcation was  crossed and the the wire was placed into the right SFA.  The patient was then systemically heparinized and an 8 [place] by 45 cm catapult sheath was placed into the right common femoral artery.  A left lower extremity angiogram was then performed with the above findings.  A V18 wire was then used to cross the aneurysmal segment and this was placed into the peroneal artery.  An 8 x 5 cm Viabahn was placed in the below-knee popliteal artery and this was then bridged to the above-knee popliteal artery with a 9 mm x 10 cm Viabahn.  An angiogram demonstrated that the proximal edge of the 9 mm Viabahn was still on some area of aneurysmal disease and there was still flow around the stents therefore I extended proximally with a 9 mm x 5 cm Viabahn stent.  Completion angiogram demonstrated brisk flow through the stents with some slight filling of the aneurysm around the proximal edge although I elected to stop at this time as the endoleak was small.  There was three-vessel runoff on completion.  The V18 wire was removed and a Bentson wire was placed through the 8 French sheath and into the aorta.  The 8 French sheath was then removed and a Pro-glide closure device was deployed with excellent hemostasis.  Contrast: 45 cc Sedation: None  Impression: Adequate seal of left popliteal artery aneurysm with Viabahn stenting.  Three-vessel runoff on completion   Philipp Brawn MD [facility] [place] Office: [phone]

## 2023-10-09 ENCOUNTER — Encounter (HOSPITAL_COMMUNITY): Payer: Self-pay | Admitting: Vascular Surgery

## 2023-10-14 DIAGNOSIS — R0609 Other forms of dyspnea: Secondary | ICD-10-CM | POA: Diagnosis not present

## 2023-10-14 DIAGNOSIS — I509 Heart failure, unspecified: Secondary | ICD-10-CM | POA: Diagnosis not present

## 2023-10-15 DIAGNOSIS — H903 Sensorineural hearing loss, bilateral: Secondary | ICD-10-CM | POA: Diagnosis not present

## 2023-10-15 DIAGNOSIS — R053 Chronic cough: Secondary | ICD-10-CM | POA: Diagnosis not present

## 2023-10-15 DIAGNOSIS — R490 Dysphonia: Secondary | ICD-10-CM | POA: Diagnosis not present

## 2023-10-15 DIAGNOSIS — H6992 Unspecified Eustachian tube disorder, left ear: Secondary | ICD-10-CM | POA: Insufficient documentation

## 2023-10-15 DIAGNOSIS — H90A32 Mixed conductive and sensorineural hearing loss, unilateral, left ear with restricted hearing on the contralateral side: Secondary | ICD-10-CM | POA: Insufficient documentation

## 2023-10-18 ENCOUNTER — Ambulatory Visit: Admitting: Audiology

## 2023-10-23 DIAGNOSIS — I130 Hypertensive heart and chronic kidney disease with heart failure and stage 1 through stage 4 chronic kidney disease, or unspecified chronic kidney disease: Secondary | ICD-10-CM | POA: Diagnosis not present

## 2023-10-23 DIAGNOSIS — R42 Dizziness and giddiness: Secondary | ICD-10-CM | POA: Diagnosis not present

## 2023-10-23 DIAGNOSIS — E118 Type 2 diabetes mellitus with unspecified complications: Secondary | ICD-10-CM | POA: Diagnosis not present

## 2023-10-23 DIAGNOSIS — R0902 Hypoxemia: Secondary | ICD-10-CM | POA: Diagnosis not present

## 2023-10-23 DIAGNOSIS — I959 Hypotension, unspecified: Secondary | ICD-10-CM | POA: Diagnosis not present

## 2023-10-23 DIAGNOSIS — I2699 Other pulmonary embolism without acute cor pulmonale: Secondary | ICD-10-CM | POA: Diagnosis not present

## 2023-10-23 DIAGNOSIS — N182 Chronic kidney disease, stage 2 (mild): Secondary | ICD-10-CM | POA: Diagnosis not present

## 2023-10-23 DIAGNOSIS — R413 Other amnesia: Secondary | ICD-10-CM | POA: Diagnosis not present

## 2023-10-23 DIAGNOSIS — I5042 Chronic combined systolic (congestive) and diastolic (congestive) heart failure: Secondary | ICD-10-CM | POA: Diagnosis not present

## 2023-10-23 DIAGNOSIS — N179 Acute kidney failure, unspecified: Secondary | ICD-10-CM | POA: Diagnosis not present

## 2023-10-23 DIAGNOSIS — R319 Hematuria, unspecified: Secondary | ICD-10-CM | POA: Diagnosis not present

## 2023-10-23 DIAGNOSIS — N390 Urinary tract infection, site not specified: Secondary | ICD-10-CM | POA: Diagnosis not present

## 2023-11-02 DIAGNOSIS — G4734 Idiopathic sleep related nonobstructive alveolar hypoventilation: Secondary | ICD-10-CM | POA: Diagnosis not present

## 2023-11-13 ENCOUNTER — Ambulatory Visit: Payer: Self-pay | Admitting: Pulmonary Disease

## 2023-11-13 ENCOUNTER — Ambulatory Visit: Admitting: Internal Medicine

## 2023-11-13 ENCOUNTER — Encounter: Payer: Self-pay | Admitting: Internal Medicine

## 2023-11-13 ENCOUNTER — Ambulatory Visit (INDEPENDENT_AMBULATORY_CARE_PROVIDER_SITE_OTHER)

## 2023-11-13 ENCOUNTER — Telehealth: Payer: Self-pay | Admitting: Internal Medicine

## 2023-11-13 VITALS — BP 126/62 | HR 106 | Temp 97.7°F | Ht 68.0 in | Wt 209.4 lb

## 2023-11-13 DIAGNOSIS — I700 Atherosclerosis of aorta: Secondary | ICD-10-CM | POA: Diagnosis not present

## 2023-11-13 DIAGNOSIS — Z794 Long term (current) use of insulin: Secondary | ICD-10-CM | POA: Diagnosis not present

## 2023-11-13 DIAGNOSIS — N1832 Chronic kidney disease, stage 3b: Secondary | ICD-10-CM | POA: Diagnosis not present

## 2023-11-13 DIAGNOSIS — I771 Stricture of artery: Secondary | ICD-10-CM | POA: Diagnosis not present

## 2023-11-13 DIAGNOSIS — J9611 Chronic respiratory failure with hypoxia: Secondary | ICD-10-CM | POA: Diagnosis not present

## 2023-11-13 DIAGNOSIS — I509 Heart failure, unspecified: Secondary | ICD-10-CM | POA: Diagnosis not present

## 2023-11-13 DIAGNOSIS — E118 Type 2 diabetes mellitus with unspecified complications: Secondary | ICD-10-CM | POA: Diagnosis not present

## 2023-11-13 DIAGNOSIS — N189 Chronic kidney disease, unspecified: Secondary | ICD-10-CM | POA: Insufficient documentation

## 2023-11-13 DIAGNOSIS — R0902 Hypoxemia: Secondary | ICD-10-CM | POA: Diagnosis not present

## 2023-11-13 DIAGNOSIS — R0609 Other forms of dyspnea: Secondary | ICD-10-CM

## 2023-11-13 DIAGNOSIS — I2510 Atherosclerotic heart disease of native coronary artery without angina pectoris: Secondary | ICD-10-CM | POA: Diagnosis not present

## 2023-11-13 DIAGNOSIS — I129 Hypertensive chronic kidney disease with stage 1 through stage 4 chronic kidney disease, or unspecified chronic kidney disease: Secondary | ICD-10-CM | POA: Diagnosis not present

## 2023-11-13 LAB — CBC WITH DIFFERENTIAL/PLATELET
Basophils Absolute: 0.1 K/uL (ref 0.0–0.1)
Basophils Relative: 0.8 % (ref 0.0–3.0)
Eosinophils Absolute: 0.4 K/uL (ref 0.0–0.7)
Eosinophils Relative: 3.8 % (ref 0.0–5.0)
HCT: 35.5 % — ABNORMAL LOW (ref 39.0–52.0)
Hemoglobin: 11.4 g/dL — ABNORMAL LOW (ref 13.0–17.0)
Lymphocytes Relative: 21.2 % (ref 12.0–46.0)
Lymphs Abs: 2.2 K/uL (ref 0.7–4.0)
MCHC: 32.1 g/dL (ref 30.0–36.0)
MCV: 73.1 fl — ABNORMAL LOW (ref 78.0–100.0)
Monocytes Absolute: 1 K/uL (ref 0.1–1.0)
Monocytes Relative: 9.3 % (ref 3.0–12.0)
Neutro Abs: 6.8 K/uL (ref 1.4–7.7)
Neutrophils Relative %: 64.9 % (ref 43.0–77.0)
Platelets: 290 K/uL (ref 150.0–400.0)
RBC: 4.85 Mil/uL (ref 4.22–5.81)
RDW: 20.3 % — ABNORMAL HIGH (ref 11.5–15.5)
WBC: 10.4 K/uL (ref 4.0–10.5)

## 2023-11-13 LAB — BASIC METABOLIC PANEL WITH GFR
BUN: 62 mg/dL — ABNORMAL HIGH (ref 6–23)
CO2: 20 meq/L (ref 19–32)
Calcium: 9.8 mg/dL (ref 8.4–10.5)
Chloride: 98 meq/L (ref 96–112)
Creatinine, Ser: 2.26 mg/dL — ABNORMAL HIGH (ref 0.40–1.50)
GFR: 28.46 mL/min — ABNORMAL LOW (ref >60.00)
Glucose, Bld: 126 mg/dL — ABNORMAL HIGH (ref 70–99)
Potassium: 3.6 meq/L (ref 3.5–5.1)
Sodium: 139 meq/L (ref 135–145)

## 2023-11-13 LAB — BRAIN NATRIURETIC PEPTIDE: Pro B Natriuretic peptide (BNP): 7 pg/mL (ref 0.0–100.0)

## 2023-11-13 LAB — TSH: TSH: 1.33 u[IU]/mL (ref 0.35–5.50)

## 2023-11-13 NOTE — Assessment & Plan Note (Addendum)
 Never smoker with PFts 04/20/23 wnl x for ERV 41% at wt 202     not clear to what extent this is actually a pulmonary  problem but pt does appear to have difficult to sort out respiratory symptoms of unknown origin for which  DDX  = almost all start with A and  include Adherence, Ace Inhibitors (to include subatril which is ACE-like) , Acid Reflux, Active Sinus Disease, Alpha 1 Antitripsin deficiency, Anxiety masquerading as Airways dz,  ABPA,  Allergy(esp in young), Aspiration (esp in elderly), Adverse effects of meds,  Active smoking or Vaping, A bunch of PE's/clot burden (a few small clots can't cause this syndrome unless there is already severe underlying pulm or vascular dz with poor reserve),  Anemia or thyroid  disorder, plus two Bs  = Bronchiectasis and Beta blocker use..and one C= CHF   Of most concern after review cxr and labs:   Adherence is always the initial prime suspect and is a multilayered concern that requires a trust but verify approach in every patient - starting with knowing how to use medications, especially inhalers, correctly, keeping up with refills and understanding the fundamental difference between maintenance and prns vs those medications only taken for a very short course and then stopped and not refilled.   ? Acid (or non-acid) GERD > always difficult to exclude as up to 75% of pts in some series report no assoc GI/ Heartburn symptoms> rec continue max (24h)  acid suppression and diet restrictions/ reviewed   Anemia is milsd  likely from renal failure but note micrcytic chronically so may have component of fe def      ? ACEi like case  The sacubitrilat component of entresto  is not and ACEi but it does lead to higher levels of bradykinin (the culprit in ACEi related cough) because it reduces Neprilysin based clearance of bradykinin. The typical symptoms are dry daytime cough (9% per PI) or complaints of a new sensation of globus or excess PNDS > leave on for now but may  need a drug holiday for renal function purposes per cards  ? Beta blocker > no wheeze/ no airflow obst on pfts and only on low dose lopressor  so proabably ok   ? Chf > if anything he appears too dry no edema on exam or cxr > will need to hold diuretics until renal function normalizes

## 2023-11-13 NOTE — Assessment & Plan Note (Signed)
 Lab Results  Component Value Date   CREATININE 2.26 (H) 11/13/2023   CREATININE 1.70 (H) 10/08/2023   CREATININE 1.56 (H) 08/08/2023     Will advise hold lasix  and aldactone  x 48 h and f/u with Dr Avva/ Cards

## 2023-11-13 NOTE — Progress Notes (Signed)
 Donald [name], male    DOB: 31-Dec-1950   MRN: [id number]   Brief patient profile:  74  yowm never smoker   pt of Dr Annella with chornic cough  self referred to pulmonary clinic 11/13/2023 for increasing need for 02 daytime and on cpap x one month which are both new problems but continue to note improvement in symptoms and sats when ge gets up and walks   PFts 04/20/23 wnl x for ERV 41% at wt 202   History of Present Illness  11/13/2023  Pulmonary/ ACUTE office eval/Clearance [place] on Entresto / eliquis    Chief Complaint  Patient presents with   Shortness of Breath    Pt complains of worsening s.o.b. Pt is on 5L-6L 02 POC. Pt sleeps with 6L 02. [facility] started treating his blood clots on March 2025 which is when the S.O.B starting progressing    Dyspnea:  increase x 2 months in need for 02 walking but he has been very sedentary - despite increase need for resting 02 rx, sats always goes up when he walks  Cough: better  Sleep: CPAP  with 6lpm per dohmeier  SABA use: none  - they don't help  02 use:as above     No obvious other patterns in  day to day or daytime pattern/variability or assoc excess/ purulent sputum or mucus plugs or hemoptysis or  chest tightness, subjective wheeze or overt sinus or hb symptoms.    Also denies any obvious fluctuation of symptoms with weather or environmental changes or other aggravating or alleviating factors except as outlined above   No unusual exposure hx or h/o childhood pna/ asthma or knowledge of premature birth.  Current Allergies, Complete Past Medical History, Past Surgical History, Family History, and Social History were reviewed in [employer] record.  ROS  The following are not active complaints unless bolded Hoarseness, sore throat, dysphagia, dental problems, itching, sneezing,  nasal congestion or discharge of excess mucus or purulent secretions, ear ache,   fever, chills, sweats, unintended wt loss or wt gain,  classically pleuritic or exertional cp,  orthopnea pnd or arm/hand swelling  or leg swelling, presyncope, palpitations, abdominal pain, anorexia, nausea, vomiting, diarrhea  or change in bowel habits or change in bladder habits, change in stools or change in urine, dysuria, hematuria,  rash, arthralgias, visual complaints, headache, numbness, weakness or ataxia or problems with walking or coordination,  change in mood or  memory.             Outpatient Medications Prior to Visit  Medication Sig Dispense Refill   apixaban  (ELIQUIS ) 5 MG TABS tablet Take 2 tablets (10mg ) by mouth twice daily for 7 days, then switch to 1 tablet (5mg ) by mouth twice daily. (Patient taking differently: Take 5 mg by mouth 2 (two) times daily.) 90 tablet 3   cetirizine (ZYRTEC) 10 MG tablet Take 10 mg by mouth every evening.     clopidogrel  (PLAVIX ) 75 MG tablet Take 1 tablet (75 mg total) by mouth daily. 60 tablet 0   Continuous Blood Gluc Sensor (FREESTYLE LIBRE 14 DAY SENSOR) MISC Inject 1 Application into the skin every 14 (fourteen) days.     finasteride  (PROSCAR ) 5 MG tablet Take 5 mg by mouth in the morning.     fluticasone  (FLONASE ) 50 MCG/ACT nasal spray Place 1 spray into both nostrils daily as needed for allergies.     furosemide  (LASIX ) 40 MG tablet Take 40 mg by mouth daily.  insulin  lispro (HUMALOG  KWIKPEN) 200 UNIT/ML KwikPen Inject 30-130 Units into the skin in the morning, at noon, and at bedtime. Sliding scale If blood sugar is over 140     memantine  (NAMENDA ) 10 MG tablet Take 1 tablet (10 mg total) by mouth 2 (two) times daily. 180 tablet 3   metFORMIN  (GLUCOPHAGE ) 1000 MG tablet Take 1 tablet (1,000 mg total) by mouth 2 (two) times daily with a meal. Restart on 8/22     metoprolol  succinate (TOPROL -XL) 50 MG 24 hr tablet Take 50 mg by mouth 2 (two) times daily. Take with or immediately following a meal.     pantoprazole  (PROTONIX ) 40 MG tablet Take 1 tablet (40 mg total) by mouth 2 (two) times  daily. (Patient taking differently: Take 40 mg by mouth every evening.) 180 tablet 0   rosuvastatin  (CRESTOR ) 20 MG tablet TAKE ONE TABLET BY MOUTH ONE TIME DAILY (Patient taking differently: Take 20 mg by mouth every evening.) 90 tablet 3   sacubitril -valsartan  (ENTRESTO ) 97-103 MG TAKE ONE TABLET BY MOUTH TWICE DAILY 180 tablet 3   silodosin (RAPAFLO) 8 MG CAPS capsule Take 8 mg by mouth every evening.     spironolactone  (ALDACTONE ) 25 MG tablet Take one-half tablet by mouth daily 45 tablet 3   TRESIBA FLEXTOUCH 200 UNIT/ML FlexTouch Pen Inject 100 Units into the skin in the morning.     No facility-administered medications prior to visit.    Past Medical History:  Diagnosis Date   Allergic rhinitis, seasonal    Asthma    Bronchitis    CHF (congestive heart failure) (HCC)    Chronic kidney disease, stage I    Cognitive impairment    Effects Short Term Memory   collar bone    dislocation left side 05/2014   Degenerative joint disease of knee    bilateral   Diabetes mellitus    Type 2   Dysrhythmia    PVCs   Fatty liver    GERD (gastroesophageal reflux disease)    Headache    Heachaces r/t eyes   Heart murmur    history of   History of acute prostatitis    History of hematuria    History of hiatal hernia    H/O   Hyperlipidemia    Hypertension    IgA nephropathy    OSA on CPAP    Pneumonia    Sinusitis       Objective:     BP 126/62 (BP Location: Left Arm, Patient Position: Sitting, Cuff Size: Normal)   Pulse (!) 106   Temp 97.7 F (36.5 C) (Temporal)   Ht 5' 8 (1.727 m)   Wt 209 lb 6.4 oz (95 kg)   SpO2 94%   BMI 31.84 kg/m   SpO2: 94 % O2 Type: Pulse O2 O2 Flow Rate (L/min): 5 L/min POC   Wt Readings from Last 3 Encounters:  11/13/23 209 lb 6.4 oz (95 kg)  10/08/23 205 lb (93 kg)  10/05/23 208 lb 12.8 oz (94.7 kg)     Mod obese (by BMI) somber wm very focused on pulse ox results over all other complaints    HEENT : Oropharynx  clear      Nasal  turbinates nl    NECK :  without  apparent JVD/ palpable Nodes/TM    LUNGS: no acc muscle use,  Nl contour chest which is clear to A and P bilaterally without cough on insp or exp maneuvers   CV:  RRR  no s3 or murmur or increase in P2, and no edema   ABD:  tensely obese / nontender  limited excursion with inspiration   MS:  Gait nl  ext warm without deformities Or obvious joint restrictions  calf tenderness, cyanosis or clubbing    SKIN: warm and dry without lesions    NEURO:  alert, approp, nl sensorium with  no motor or cerebellar deficits apparent.    CXR PA and Lateral:   11/13/2023 :    I personally reviewed images and impression is as follows:     CM/ small lung vol - no acute changes   Labs ordered/ reviewed:      Chemistry      Component Value Date/Time   NA 139 11/13/2023 1358   NA 141 06/11/2023 1442   K 3.6 11/13/2023 1358   CL 98 11/13/2023 1358   CO2 20 11/13/2023 1358   BUN 62 (H) 11/13/2023 1358   BUN 58 (H) 06/11/2023 1442   CREATININE 2.26 (H) 11/13/2023 1358   CREATININE 1.56 (H) 08/08/2023 1059      Component Value Date/Time   CALCIUM  9.8 11/13/2023 1358   ALKPHOS 47 08/08/2023 1059   AST 24 08/08/2023 1059   ALT 26 08/08/2023 1059   BILITOT 0.4 08/08/2023 1059     Lab Results  Component Value Date   CREATININE 2.26 (H) 11/13/2023   CREATININE 1.70 (H) 10/08/2023   CREATININE 1.56 (H) 08/08/2023       Lab Results  Component Value Date   WBC 10.4 11/13/2023   HGB 11.4 (L) 11/13/2023   HCT 35.5 (L) 11/13/2023   MCV 73.1 (L) 11/13/2023   PLT 290.0 11/13/2023     Lab Results  Component Value Date   HGB 11.4 (L) 11/13/2023   HGB 12.6 (L) 10/08/2023   HGB 11.5 (L) 08/08/2023   HGB 11.2 (L) 07/04/2023   HGB 12.2 (L) 06/11/2023   HGB 12.0 (L) 04/16/2023   HGB 13.4 10/18/2020      Lab Results  Component Value Date   TSH 1.33 11/13/2023     Lab Results  Component Value Date   PROBNP 7.0 11/13/2023           Assessment    DOE (dyspnea on exertion) Never smoker with PFts 04/20/23 wnl x for ERV 41% at wt 202     not clear to what extent this is actually a pulmonary  problem but pt does appear to have difficult to sort out respiratory symptoms of unknown origin for which  DDX  = almost all start with A and  include Adherence, Ace Inhibitors (to include subatril which is ACE-like) , Acid Reflux, Active Sinus Disease, Alpha 1 Antitripsin deficiency, Anxiety masquerading as Airways dz,  ABPA,  Allergy(esp in young), Aspiration (esp in elderly), Adverse effects of meds,  Active smoking or Vaping, A bunch of PE's/clot burden (a few small clots can't cause this syndrome unless there is already severe underlying pulm or vascular dz with poor reserve),  Anemia or thyroid  disorder, plus two Bs  = Bronchiectasis and Beta blocker use..and one C= CHF   Of most concern after review cxr and labs:   Adherence is always the initial prime suspect and is a multilayered concern that requires a trust but verify approach in every patient - starting with knowing how to use medications, especially inhalers, correctly, keeping up with refills and understanding the fundamental difference between maintenance and prns vs those medications only taken for a very  short course and then stopped and not refilled.   ? Acid (or non-acid) GERD > always difficult to exclude as up to 75% of pts in some series report no assoc GI/ Heartburn symptoms> rec continue max (24h)  acid suppression and diet restrictions/ reviewed   Anemia is milsd  likely from renal failure but note micrcytic chronically so may have component of fe def      ? ACEi like case  The sacubitrilat component of entresto  is not and ACEi but it does lead to higher levels of bradykinin (the culprit in ACEi related cough) because it reduces Neprilysin based clearance of bradykinin. The typical symptoms are dry daytime cough (9% per PI) or complaints of a new sensation of globus or excess  PNDS > leave on for now but may need a drug holiday for renal function purposes per cards  ? Beta blocker > no wheeze/ no airflow obst on pfts and only on low dose lopressor  so proabably ok   ? Chf > if anything he appears too dry no edema on exam or cxr > will need to hold diuretics until renal function normalizes   Chronic respiratory failure with hypoxia (HCC) 11/13/2023   Walked on 5lpm POC  x  2   lap(s) =  approx 500  ft  @ nl  pace, stopped due to sob  with lowest 02 sats 96% and minimal sob     Since no chf/ no ild and on eliquis  allready most likely he has bibasilar atx due to tense obesity better in upright position and walking > advised on wt loss and f/u with sleep medicine as likely needs cpap titration/ 02 noct titration as well  In menatime rec: Make sure you check your oxygen  saturation  AT  your highest level of activity (not after you stop)   to be sure it stays over 90% and adjust  02 flow upward to maintain this level if needed but remember to turn it back to previous settings when you stop (to conserve your supply).    Each maintenance medication was reviewed in detail including emphasizing most importantly the difference between maintenance and prns and under what circumstances the prns are to be triggered using an action plan format where appropriate.  Total time for H and P, chart review, counseling, reviewing hfa/02/ pulse ox  device(s) , directly observing portions of ambulatory 02 saturation study/ and generating customized AVS unique to this ACUTE  office visit / same day charting  = 45 min pt new to me                Acute on chronic renal insufficiency Lab Results  Component Value Date   CREATININE 2.26 (H) 11/13/2023   CREATININE 1.70 (H) 10/08/2023   CREATININE 1.56 (H) 08/08/2023     Will advise hold lasix  and aldactone  x 48 h and f/u with Dr Avva/ Cards      Ozell America, MD 11/13/2023

## 2023-11-13 NOTE — Assessment & Plan Note (Signed)
 11/13/2023   Walked on 5lpm POC  x  2   lap(s) =  approx 500  ft  @ nl  pace, stopped due to sob  with lowest 02 sats 96% and minimal sob     Since no chf/ no ild and on eliquis  allready most likely he has bibasilar atx due to tense obesity better in upright position and walking > advised on wt loss and f/u with sleep medicine as likely needs cpap titration/ 02 noct titration as well  In menatime rec: Make sure you check your oxygen  saturation  AT  your highest level of activity (not after you stop)   to be sure it stays over 90% and adjust  02 flow upward to maintain this level if needed but remember to turn it back to previous settings when you stop (to conserve your supply).    Each maintenance medication was reviewed in detail including emphasizing most importantly the difference between maintenance and prns and under what circumstances the prns are to be triggered using an action plan format where appropriate.  Total time for H and P, chart review, counseling, reviewing hfa/02/ pulse ox  device(s) , directly observing portions of ambulatory 02 saturation study/ and generating customized AVS unique to this ACUTE  office visit / same day charting  = 45 min pt new to me

## 2023-11-13 NOTE — Telephone Encounter (Signed)
 Appointment made for today 11/13/2023 at 1 PM with Dr Darlean.  FYI Only or Action Required?: FYI only for provider.  Patient is followed in Pulmonology for Dyspnea on exertion, last seen on 10/05/2023 by Hunsucker, Donnice SAUNDERS, MD.  Called Nurse Triage reporting No chief complaint on file..  Symptoms began about 3 weeks ago.  Interventions attempted: Home oxygen  use and Increased fluids/rest.  Symptoms are: gradually worsening.  Triage Disposition: Go to ED Now (Notify PCP)  Patient/caregiver understands and will follow disposition?: No, refuses disposition     [facility] staff aware of patient's disposition and this RN was advised to put patient in for an acute appointment today if available, which 1 PM with Dr Darlean was available. Patient scheduled there.             On Plavix        Copied from CRM [id number]. Topic: Clinical - Red Word Triage >> Nov 13, 2023 10:25 AM Benton O wrote: [facility] that prompted transfer to Nurse Triage: my husband having more trouble with oxygen  levels feels more tired taking medicine for the blood clots maybe somemedicine needs to be adjusted he feels his breathing has changed a bit  Feels he is running out of breath when walking his energy levels goes down Reason for Disposition  [1] MODERATE difficulty breathing (e.g., speaks in phrases, SOB even at rest, pulse 100-120) AND [2] NEW-onset or WORSE than normal  Answer Assessment - Initial Assessment Questions E2C2 Pulmonary Triage - Initial Assessment Questions Chief Complaint (e.g., cough, sob, wheezing, fever, chills, sweat or additional symptoms) *Go to specific symptom protocol after initial questions. Shortness of breath worse on exertion  How long have symptoms been present? About 3 weeks  Have you tested for COVID or Flu? Note: If not, ask patient if a home test can be taken. If so, instruct patient to call back for positive results. No  MEDICINES:   Have you used any OTC meds to  help with symptoms? No If yes, ask What medications? N/a  Have you used your inhalers/maintenance medication? No If yes, What medications? N/a  If inhaler, ask How many puffs and how often? Note: Review instructions on medication in the chart. N/a  OXYGEN : Do you wear supplemental oxygen ? Yes If yes, How many liters are you supposed to use? 4-5 liters of Oxygen  for the past 3 weeks and the last week more often at 5 liters of Oxygen --still seems fatigued after short distances Wife states he is supposed to use what he feels comfortable with  Do you monitor your oxygen  levels? Yes If yes, What is your reading (oxygen  level) today? Low to mid 90s  What is your usual oxygen  saturation reading?  (Note: Pulmonary O2 sats should be 90% or greater) ----            1. RESPIRATORY STATUS: Describe your breathing? (e.g., wheezing, shortness of breath, unable to speak, severe coughing)      Shortness of breath on exertion more often 2. ONSET: When did this breathing problem begin?      About 3 weeks ago and worse in the past week 3. PATTERN Does the difficult breathing come and go, or has it been constant since it started?      N/a 4. SEVERITY: How bad is your breathing? (e.g., mild, moderate, severe)    - MILD: No SOB at rest, mild SOB with walking, speaks normally in sentences, can lie down, no retractions, pulse < 100.    -  MODERATE: SOB at rest, SOB with minimal exertion and prefers to sit, cannot lie down flat, speaks in phrases, mild retractions, audible wheezing, pulse 100-120.    - SEVERE: Very SOB at rest, speaks in single words, struggling to breathe, sitting hunched forward, retractions, pulse > 120      Moderate 5. RECURRENT SYMPTOM: Have you had difficulty breathing before? If Yes, ask: When was the last time? and What happened that time?      N/a 6. CARDIAC HISTORY: Do you have any history of heart disease? (e.g., heart attack, angina,  bypass surgery, angioplasty)      Heart failure 7. LUNG HISTORY: Do you have any history of lung disease?  (e.g., pulmonary embolus, asthma, emphysema)     Bilateral pulmonary embolisms 8. CAUSE: What do you think is causing the breathing problem?      Unsure 9. OTHER SYMPTOMS: Do you have any other symptoms? (e.g., dizziness, runny nose, cough, chest pain, fever)     Loses energy on exertion quicker than a week or two ago    Patient states he just doesn't feel right and feel like his upper chest doesn't feel right. Patient states he has increased his Oxygen  use up to 6 liters.  Patient and his wife are advised that if anything worsens to go to the Emergency Room. They verbalized understanding.  Protocols used: Breathing Difficulty-A-AH

## 2023-11-13 NOTE — Telephone Encounter (Signed)
 Labs kidney function significantly worse and meds will need to be adjustd  Suggest hold lasix  and aldactone  x 48 h or until he speaks to PCP, Cards, or Dr Annella

## 2023-11-15 ENCOUNTER — Other Ambulatory Visit: Payer: Self-pay | Admitting: *Deleted

## 2023-11-15 ENCOUNTER — Encounter: Payer: Self-pay | Admitting: Internal Medicine

## 2023-11-15 ENCOUNTER — Ambulatory Visit: Payer: Self-pay | Admitting: Internal Medicine

## 2023-11-15 DIAGNOSIS — I724 Aneurysm of artery of lower extremity: Secondary | ICD-10-CM

## 2023-11-15 NOTE — Telephone Encounter (Signed)
**Note De-identified  [place] Obfuscation** Please advise 

## 2023-11-15 NOTE — Telephone Encounter (Signed)
 The hgb is problably related to the kidney function and also will need to be evaluated by whomever he sees next but may have a borderline Iron deficiency that will need careful f/u.

## 2023-11-16 NOTE — Telephone Encounter (Signed)
 I called and spoke to pt's wife, Diane. (DPR) I informed Diane of Dr Chari note and verbalized understanding. Diane asked if I would also send this via Mychart to make sure that had the correct information as she was unable to write this down to make sure this was relayed to the pt. I informed Diane I would. NFN

## 2023-11-19 NOTE — Telephone Encounter (Signed)
 Lm for patient. I have reviewed patient's chart and it does not appear that our office prescribed these medications originally.

## 2023-11-19 NOTE — Telephone Encounter (Signed)
 Copied from CRM [id number]. Topic: General - Other >> Nov 19, 2023  2:02 PM Rilla B wrote: Reason for CRM: Patient's wife returning call to [facility] (?).  States she missed call and was told to call back. Please call patient or they state it is okay to send a MyChart message.  Called and spoke with patients spouse per DPR. Advised of MH note. I do not manage these medications. Can we check to see if he has discussed with his PCP or cardiologist? If not, I advise he contact his cardiologist. Thanks! Patient has appt with PCP tomorrow and is going to make Cardiologist aware.  Nfn

## 2023-11-20 ENCOUNTER — Encounter: Payer: Self-pay | Admitting: Cardiovascular Disease

## 2023-11-20 DIAGNOSIS — R413 Other amnesia: Secondary | ICD-10-CM | POA: Diagnosis not present

## 2023-11-20 DIAGNOSIS — E118 Type 2 diabetes mellitus with unspecified complications: Secondary | ICD-10-CM | POA: Diagnosis not present

## 2023-11-20 DIAGNOSIS — R0902 Hypoxemia: Secondary | ICD-10-CM | POA: Diagnosis not present

## 2023-11-20 DIAGNOSIS — Z794 Long term (current) use of insulin: Secondary | ICD-10-CM | POA: Diagnosis not present

## 2023-11-20 DIAGNOSIS — I5042 Chronic combined systolic (congestive) and diastolic (congestive) heart failure: Secondary | ICD-10-CM | POA: Diagnosis not present

## 2023-11-20 DIAGNOSIS — N179 Acute kidney failure, unspecified: Secondary | ICD-10-CM | POA: Diagnosis not present

## 2023-11-20 DIAGNOSIS — I129 Hypertensive chronic kidney disease with stage 1 through stage 4 chronic kidney disease, or unspecified chronic kidney disease: Secondary | ICD-10-CM | POA: Diagnosis not present

## 2023-11-20 DIAGNOSIS — N182 Chronic kidney disease, stage 2 (mild): Secondary | ICD-10-CM | POA: Diagnosis not present

## 2023-11-20 DIAGNOSIS — I2699 Other pulmonary embolism without acute cor pulmonale: Secondary | ICD-10-CM | POA: Diagnosis not present

## 2023-11-20 DIAGNOSIS — R319 Hematuria, unspecified: Secondary | ICD-10-CM | POA: Diagnosis not present

## 2023-11-20 DIAGNOSIS — I130 Hypertensive heart and chronic kidney disease with heart failure and stage 1 through stage 4 chronic kidney disease, or unspecified chronic kidney disease: Secondary | ICD-10-CM | POA: Diagnosis not present

## 2023-11-20 DIAGNOSIS — D649 Anemia, unspecified: Secondary | ICD-10-CM | POA: Diagnosis not present

## 2023-11-20 DIAGNOSIS — R5383 Other fatigue: Secondary | ICD-10-CM | POA: Diagnosis not present

## 2023-11-20 DIAGNOSIS — N390 Urinary tract infection, site not specified: Secondary | ICD-10-CM | POA: Diagnosis not present

## 2023-11-20 DIAGNOSIS — N1832 Chronic kidney disease, stage 3b: Secondary | ICD-10-CM | POA: Diagnosis not present

## 2023-11-20 LAB — COMPREHENSIVE METABOLIC PANEL (CC13): EGFR: 39.9

## 2023-11-21 NOTE — Telephone Encounter (Signed)
 That is all great news.  Those kidney numbers did improve very quickly, which is encouraging.  Also happy to hear that electrolytes are normal. It is okay to continue the spironolactone , but the challenge is that the combination of Entresto  with spironolactone  but WITHOUT furosemide  can sometimes lead to high potassium levels.  If he does not restart his furosemide , we should recheck a basic metabolic panel in roughly 30 days.

## 2023-11-22 NOTE — Progress Notes (Unsigned)
 Patient ID: Donald [name], male   DOB: [birth year], 73 y.o.   MRN: [id number]  Reason for Consult: Routine Post Op   Referred by Janey Santos, MD  Subjective:     HPI Donald [name] is a 73 y.o. male with bilateral popliteal artery aneurysms.  In June 2025 he underwent left popliteal artery stenting.  He has since recovered well and denies any left leg symptoms.  He has had worsening shortness of breath but at times is on 5 L of oxygen  at home.  He has not been ambulating much and is living fairly sedentary.  He denies any significant chest pain or pressure but does have fatigue and significant shortness of breath with exertion.  He denies any pain in the feet or toes bilaterally.  Past Medical History:  Diagnosis Date   Allergic rhinitis, seasonal    Asthma    Bronchitis    CHF (congestive heart failure) (HCC)    Chronic kidney disease, stage I    Cognitive impairment    Effects Short Term Memory   collar bone    dislocation left side 05/2014   Degenerative joint disease of knee    bilateral   Diabetes mellitus    Type 2   Dysrhythmia    PVCs   Fatty liver    GERD (gastroesophageal reflux disease)    Headache    Heachaces r/t eyes   Heart murmur    history of   History of acute prostatitis    History of hematuria    History of hiatal hernia    H/O   Hyperlipidemia    Hypertension    IgA nephropathy    OSA on CPAP    Peripheral vascular disease (HCC)    Pneumonia    Sinusitis    Family History  Problem Relation Age of Onset   Coronary artery disease Father    Diabetes type II Father    Heart attack Father    Past Surgical History:  Procedure Laterality Date   ABDOMINAL AORTOGRAM N/A 10/08/2023   Procedure: ABDOMINAL AORTOGRAM;  Surgeon: Pearline Norman RAMAN, MD;  Location: [facility] INVASIVE CV LAB;  Service: Cardiovascular;  Laterality: N/A;   BIOPSY  05/17/2020   Procedure: BIOPSY;  Surgeon: Rosalie Kitchens, MD;  Location: [facility];  Service: Endoscopy;;    CARDIAC CATHETERIZATION  09/29/2011   normal   CATARACT EXTRACTION Bilateral    CERVICAL SPINE SURGERY     COLONOSCOPY WITH PROPOFOL  N/A 05/17/2020   Procedure: COLONOSCOPY WITH PROPOFOL ;  Surgeon: Rosalie Kitchens, MD;  Location: [facility];  Service: Endoscopy;  Laterality: N/A;   deviated septum     1990s   ESOPHAGOGASTRODUODENOSCOPY (EGD) WITH PROPOFOL  N/A 05/17/2020   Procedure: ESOPHAGOGASTRODUODENOSCOPY (EGD) WITH PROPOFOL ;  Surgeon: Rosalie Kitchens, MD;  Location: [facility];  Service: Endoscopy;  Laterality: N/A;   HEMOSTASIS CLIP PLACEMENT  05/17/2020   Procedure: HEMOSTASIS CLIP PLACEMENT;  Surgeon: Rosalie Kitchens, MD;  Location: [facility];  Service: Endoscopy;;   HERNIA REPAIR     LEFT HEART CATHETERIZATION WITH CORONARY ANGIOGRAM N/A 09/29/2011   Procedure: LEFT HEART CATHETERIZATION WITH CORONARY ANGIOGRAM;  Surgeon: Jerel Balding, MD;  Location: [facility] CATH LAB;  Service: Cardiovascular;  Laterality: N/A;   LOWER EXTREMITY ANGIOGRAPHY Left 10/08/2023   Procedure: Lower Extremity Angiography;  Surgeon: Pearline Norman RAMAN, MD;  Location: [facility] INVASIVE CV LAB;  Service: Cardiovascular;  Laterality: Left;   LOWER EXTREMITY INTERVENTION Left 10/08/2023   Procedure: LOWER EXTREMITY INTERVENTION;  Surgeon: Pearline,  Norman RAMAN, MD;  Location: [facility] INVASIVE CV LAB;  Service: Cardiovascular;  Laterality: Left;   NM MYOVIEW  LTD  07/08/2011   mild LV dilatation,mild septal hypokinesia   POLYPECTOMY  05/17/2020   Procedure: POLYPECTOMY;  Surgeon: Rosalie Kitchens, MD;  Location: [facility];  Service: Endoscopy;;   RIGHT HEART CATH N/A 04/17/2023   Procedure: RIGHT HEART CATH;  Surgeon: Anner Alm ORN, MD;  Location: [facility] INVASIVE CV LAB;  Service: Cardiovascular;  Laterality: N/A;   TONSILLECTOMY     US  ECHOCARDIOGRAPHY  09/07/2011   EF 35-45%,mod.ant hypokinesis,boderline LA & RA enlargement,trace MR,TR & PI    Short Social History:  Social History   Tobacco Use   Smoking status: Never   Smokeless  tobacco: Never   Tobacco comments:    Second hand smoke for 26 years per pt  Substance Use Topics   Alcohol  use: No    Alcohol /week: 0.0 standard drinks of alcohol     Allergies  Allergen Reactions   Ciprofloxacin     Tendons problem in shoulder Other reaction(s): Arthralgias (intolerance), Myalgias (intolerance)   Jardiance [Empagliflozin] Other (See Comments)    Dehydration, uti.   Levaquin [Levofloxacin]     Other reaction(s): Arthralgias (intolerance), Myalgias (intolerance)   Aricept  [Donepezil  Hcl] Nausea Only   Bactrim [Sulfamethoxazole-Trimethoprim] Hives   Breztri  Aerosphere [Budeson-Glycopyrrol-Formoterol ] Other (See Comments)    Patient and wife are unsure what happened, however they remember he had a bad reaction   Misc. Sulfonamide Containing Compounds Rash    Current Outpatient Medications  Medication Sig Dispense Refill   apixaban  (ELIQUIS ) 5 MG TABS tablet Take 2 tablets (10mg ) by mouth twice daily for 7 days, then switch to 1 tablet (5mg ) by mouth twice daily. (Patient taking differently: Take 5 mg by mouth 2 (two) times daily.) 90 tablet 3   cetirizine (ZYRTEC) 10 MG tablet Take 10 mg by mouth every evening.     clopidogrel  (PLAVIX ) 75 MG tablet Take 1 tablet (75 mg total) by mouth daily. 60 tablet 0   Continuous Blood Gluc Sensor (FREESTYLE LIBRE 14 DAY SENSOR) MISC Inject 1 Application into the skin every 14 (fourteen) days.     finasteride  (PROSCAR ) 5 MG tablet Take 5 mg by mouth in the morning.     fluticasone  (FLONASE ) 50 MCG/ACT nasal spray Place 1 spray into both nostrils daily as needed for allergies.     furosemide  (LASIX ) 40 MG tablet Take 40 mg by mouth daily.     insulin  lispro (HUMALOG  KWIKPEN) 200 UNIT/ML KwikPen Inject 30-130 Units into the skin in the morning, at noon, and at bedtime. Sliding scale If blood sugar is over 140     memantine  (NAMENDA ) 10 MG tablet Take 1 tablet (10 mg total) by mouth 2 (two) times daily. 180 tablet 3   metFORMIN   (GLUCOPHAGE ) 1000 MG tablet Take 1 tablet (1,000 mg total) by mouth 2 (two) times daily with a meal. Restart on 8/22     metoprolol  succinate (TOPROL -XL) 50 MG 24 hr tablet Take 50 mg by mouth 2 (two) times daily. Take with or immediately following a meal.     pantoprazole  (PROTONIX ) 40 MG tablet Take 1 tablet (40 mg total) by mouth 2 (two) times daily. (Patient taking differently: Take 40 mg by mouth every evening.) 180 tablet 0   rosuvastatin  (CRESTOR ) 20 MG tablet TAKE ONE TABLET BY MOUTH ONE TIME DAILY (Patient taking differently: Take 20 mg by mouth every evening.) 90 tablet 3   sacubitril -valsartan  (ENTRESTO ) 97-103 MG  TAKE ONE TABLET BY MOUTH TWICE DAILY 180 tablet 3   silodosin (RAPAFLO) 8 MG CAPS capsule Take 8 mg by mouth every evening.     spironolactone  (ALDACTONE ) 25 MG tablet Take one-half tablet by mouth daily 45 tablet 3   TRESIBA FLEXTOUCH 200 UNIT/ML FlexTouch Pen Inject 100 Units into the skin in the morning.     No current facility-administered medications for this visit.    REVIEW OF SYSTEMS  All other systems were reviewed and are negative     Objective:  Objective   Vitals:   11/23/23 1420  BP: 105/68  Pulse: 85  Resp: 20  Temp: 98 F (36.7 C)  TempSrc: Temporal  SpO2: 96%  Weight: 211 lb 1.6 oz (95.8 kg)  Height: 5' 8 (1.727 m)   Body mass index is 32.1 kg/m.  Physical Exam General: no acute distress Cardiac: hemodynamically stable Pulm: Increased work of breathing on nasal cannula oxygen  Neuro: alert, no focal deficit Extremities: no edema, cyanosis or wounds  Data: ABI ABI Findings:  +---------+------------------+-----+---------+--------+  Right   Rt Pressure (mmHg)IndexWaveform Comment   +---------+------------------+-----+---------+--------+  Brachial 99                                        +---------+------------------+-----+---------+--------+  ATA     105               0.94 triphasic           +---------+------------------+-----+---------+--------+  PTA     101               0.90 triphasic          +---------+------------------+-----+---------+--------+  Great Toe141               1.26                    +---------+------------------+-----+---------+--------+   +---------+------------------+-----+---------+-------+  Left    Lt Pressure (mmHg)IndexWaveform Comment  +---------+------------------+-----+---------+-------+  Brachial 112                                      +---------+------------------+-----+---------+-------+  ATA     115               1.03 triphasic         +---------+------------------+-----+---------+-------+  PTA     124               1.11 triphasic         +---------+------------------+-----+---------+-------+  Great Toe80                0.71                   +---------+------------------+-----+---------+-------+   +-------+-----------+-----------+------------+------------+  ABI/TBIToday's ABIToday's TBIPrevious ABIPrevious TBI  +-------+-----------+-----------+------------+------------+  Right 0.94       1.26                                 +-------+-----------+-----------+------------+------------+  Left  1.11       0.71                                 +-------+-----------+-----------+------------+------------+   Duplex +---------------+-------+-----------+---------+--------+-----+--------+  Right  PoplitealAP (cm)Transv (cm)Waveform StenosisShapeComments  +---------------+-------+-----------+---------+--------+-----+--------+  Proximal      1.21   1.30       triphasic                       +---------------+-------+-----------+---------+--------+-----+--------+  Mid           1.73   1.78       triphasic                       +---------------+-------+-----------+---------+--------+-----+--------+  Distal        2.48   2.07       triphasic                        +---------------+-------+-----------+---------+--------+-----+--------+   +--------------+-------+-----------+---------+--------+-----+--------+  Left PoplitealAP (cm)Transv (cm)Waveform StenosisShapeComments  +--------------+-------+-----------+---------+--------+-----+--------+  Mid          3.00   4.20       triphasic                       +--------------+-------+-----------+---------+--------+-----+--------+      +-----------+--------+-----+--------+---------+----------------------+  LEFT      PSV cm/sRatioStenosisWaveform Comments                +-----------+--------+-----+--------+---------+----------------------+  CFA Distal 58                   triphasic                        +-----------+--------+-----+--------+---------+----------------------+  DFA       51                   biphasic                         +-----------+--------+-----+--------+---------+----------------------+  SFA Prox   74                   triphasic                        +-----------+--------+-----+--------+---------+----------------------+  SFA Mid    76                   triphasicmeasures 1.11 x 1.06cm  +-----------+--------+-----+--------+---------+----------------------+  SFA Distal 55                   triphasic                        +-----------+--------+-----+--------+---------+----------------------+  TP Trunk   65                   triphasic                        +-----------+--------+-----+--------+---------+----------------------+  ATA Prox   54                   triphasic                        +-----------+--------+-----+--------+---------+----------------------+  ATA Mid    66                   triphasic                        +-----------+--------+-----+--------+---------+----------------------+  ATA Distal 68                   triphasic                         +-----------+--------+-----+--------+---------+----------------------+  PTA Prox   74                   triphasic                        +-----------+--------+-----+--------+---------+----------------------+  PTA Mid    69                   triphasic                        +-----------+--------+-----+--------+---------+----------------------+  PTA Distal 87                   triphasic                        +-----------+--------+-----+--------+---------+----------------------+  PERO Prox  33                   triphasic                        +-----------+--------+-----+--------+---------+----------------------+  PERO Mid                                 unable to insonate      +-----------+--------+-----+--------+---------+----------------------+  PERO Distal                              unable to insonate      +-----------+--------+-----+--------+---------+----------------------+     Left Stent(s):  +----------------+--------+--------+---------+--------+  Popliteal arteryPSV cm/sStenosisWaveform Comments  +----------------+--------+--------+---------+--------+  Prox to Stent   60              triphasic          +----------------+--------+--------+---------+--------+  Proximal Stent  59              triphasic          +----------------+--------+--------+---------+--------+  Mid Stent       86              triphasic          +----------------+--------+--------+---------+--------+  Distal Stent    52              triphasic          +----------------+--------+--------+---------+--------+  Distal to Stent 69              triphasic          +----------------+--------+--------+---------+--------+      Assessment/Plan:   Donald [name] is a 73 y.o. male with bilateral popliteal artery aneurysms. The left was recently treated with stenting on 10/08/2023.  He has done well and there is triphasic flow throughout the  stent.  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  Today on duplex it is measuring about 2.4.  Given his worsening of his shortness of breath we decided to hold off on any right-sided intervention at this time and we will allow him to see  his cardiologist which is scheduled in November.  I explained that he is just meeting the threshold for treatment of his right popliteal artery aneurysm and he has not had any embolic episodes and therefore I think it is best to hold off on intervention until he has follow-up with his cardiologist as I think he may be developing worsening cardiopulmonary issues.  Plan for follow-up in about 4 months with a bilateral duplex which would be after his cardiology appointment so we can better stratify his cardiopulmonary risk prior to right popliteal intervention.  Norman GORMAN Serve MD [facility]

## 2023-11-23 ENCOUNTER — Encounter: Payer: Self-pay | Admitting: Vascular Surgery

## 2023-11-23 ENCOUNTER — Ambulatory Visit: Attending: Vascular Surgery | Admitting: Vascular Surgery

## 2023-11-23 ENCOUNTER — Ambulatory Visit (HOSPITAL_COMMUNITY)
Admission: RE | Admit: 2023-11-23 | Discharge: 2023-11-23 | Disposition: A | Discharge status: Home / Self Care | Source: Ambulatory Visit | Attending: Vascular Surgery | Admitting: Vascular Surgery

## 2023-11-23 ENCOUNTER — Ambulatory Visit (HOSPITAL_BASED_OUTPATIENT_CLINIC_OR_DEPARTMENT_OTHER)
Admission: RE | Admit: 2023-11-23 | Discharge: 2023-11-23 | Disposition: A | Discharge status: Home / Self Care | Source: Ambulatory Visit | Attending: Vascular Surgery | Admitting: Vascular Surgery

## 2023-11-23 VITALS — BP 105/68 | HR 85 | Temp 98.0°F | Resp 20 | Ht 68.0 in | Wt 211.1 lb

## 2023-11-23 DIAGNOSIS — I724 Aneurysm of artery of lower extremity: Secondary | ICD-10-CM | POA: Insufficient documentation

## 2023-11-23 LAB — VAS US ABI WITH/WO TBI
Left ABI: 1.11
Right ABI: 0.94

## 2023-11-26 ENCOUNTER — Other Ambulatory Visit: Payer: Self-pay

## 2023-11-26 DIAGNOSIS — I724 Aneurysm of artery of lower extremity: Secondary | ICD-10-CM

## 2023-12-02 DIAGNOSIS — G4734 Idiopathic sleep related nonobstructive alveolar hypoventilation: Secondary | ICD-10-CM | POA: Diagnosis not present

## 2023-12-03 ENCOUNTER — Encounter: Payer: Self-pay | Admitting: Vascular Surgery

## 2023-12-03 MED ORDER — CLOPIDOGREL BISULFATE 75 MG PO TABS: 75.0000 mg | ORAL_TABLET | Freq: Every day | ORAL | 3 refills | Status: DC

## 2023-12-11 ENCOUNTER — Encounter: Payer: Self-pay | Admitting: Neurology

## 2023-12-13 NOTE — Telephone Encounter (Signed)
 Called [facility] to follow up on Pt Referral , Referral coordinator informed  That Pt was on wait list   and Today they will be calling Pts from wait list to get them scheduled for Nov and Dec .     Called Pt spoke to wife (DPR)  TO inform PT referral was received and placed on wait lis , However Pt should be on look out for call , Referral coordinator informed they will start calling Pt as of today from wait list

## 2023-12-13 NOTE — Telephone Encounter (Signed)
*(   Correction )*   Called [facility] to follow up on Pt Referral , Referral coordinator informed  That Pt was on wait list   and Today they will be calling Pts from wait list to get them scheduled for Nov and Dec .        Called Pt spoke to wife (DPR)  TO inform PT referral was received and placed on wait lis , However Pt should be on look out for call , Referral coordinator informed they will start calling Pt as of today from wait list

## 2023-12-14 DIAGNOSIS — I509 Heart failure, unspecified: Secondary | ICD-10-CM | POA: Diagnosis not present

## 2023-12-14 DIAGNOSIS — R0609 Other forms of dyspnea: Secondary | ICD-10-CM | POA: Diagnosis not present

## 2023-12-20 DIAGNOSIS — J383 Other diseases of vocal cords: Secondary | ICD-10-CM | POA: Insufficient documentation

## 2023-12-20 DIAGNOSIS — R6889 Other general symptoms and signs: Secondary | ICD-10-CM | POA: Diagnosis not present

## 2023-12-20 DIAGNOSIS — J387 Other diseases of larynx: Secondary | ICD-10-CM | POA: Diagnosis not present

## 2023-12-20 DIAGNOSIS — R490 Dysphonia: Secondary | ICD-10-CM | POA: Insufficient documentation

## 2023-12-20 DIAGNOSIS — R053 Chronic cough: Secondary | ICD-10-CM | POA: Diagnosis not present

## 2024-01-02 DIAGNOSIS — G4734 Idiopathic sleep related nonobstructive alveolar hypoventilation: Secondary | ICD-10-CM | POA: Diagnosis not present

## 2024-01-04 ENCOUNTER — Inpatient Hospital Stay: Attending: Oncology

## 2024-01-04 ENCOUNTER — Encounter: Payer: Self-pay | Admitting: Oncology

## 2024-01-04 ENCOUNTER — Other Ambulatory Visit: Payer: Self-pay | Admitting: Oncology

## 2024-01-04 ENCOUNTER — Telehealth: Payer: Self-pay | Admitting: Pharmacy Technician

## 2024-01-04 ENCOUNTER — Inpatient Hospital Stay (HOSPITAL_BASED_OUTPATIENT_CLINIC_OR_DEPARTMENT_OTHER): Admitting: Oncology

## 2024-01-04 VITALS — BP 90/60 | HR 93 | Temp 97.6°F | Resp 17 | Ht 68.0 in | Wt 208.0 lb

## 2024-01-04 DIAGNOSIS — D509 Iron deficiency anemia, unspecified: Secondary | ICD-10-CM | POA: Insufficient documentation

## 2024-01-04 DIAGNOSIS — I2699 Other pulmonary embolism without acute cor pulmonale: Secondary | ICD-10-CM | POA: Diagnosis not present

## 2024-01-04 DIAGNOSIS — Z7901 Long term (current) use of anticoagulants: Secondary | ICD-10-CM | POA: Diagnosis not present

## 2024-01-04 DIAGNOSIS — I82492 Acute embolism and thrombosis of other specified deep vein of left lower extremity: Secondary | ICD-10-CM

## 2024-01-04 DIAGNOSIS — R262 Difficulty in walking, not elsewhere classified: Secondary | ICD-10-CM | POA: Insufficient documentation

## 2024-01-04 DIAGNOSIS — E1122 Type 2 diabetes mellitus with diabetic chronic kidney disease: Secondary | ICD-10-CM | POA: Diagnosis not present

## 2024-01-04 DIAGNOSIS — I130 Hypertensive heart and chronic kidney disease with heart failure and stage 1 through stage 4 chronic kidney disease, or unspecified chronic kidney disease: Secondary | ICD-10-CM | POA: Insufficient documentation

## 2024-01-04 DIAGNOSIS — Z79899 Other long term (current) drug therapy: Secondary | ICD-10-CM | POA: Insufficient documentation

## 2024-01-04 DIAGNOSIS — N1832 Chronic kidney disease, stage 3b: Secondary | ICD-10-CM | POA: Diagnosis not present

## 2024-01-04 DIAGNOSIS — Z7902 Long term (current) use of antithrombotics/antiplatelets: Secondary | ICD-10-CM | POA: Diagnosis not present

## 2024-01-04 DIAGNOSIS — Z860100 Personal history of colon polyps, unspecified: Secondary | ICD-10-CM | POA: Diagnosis not present

## 2024-01-04 DIAGNOSIS — Z86711 Personal history of pulmonary embolism: Secondary | ICD-10-CM | POA: Insufficient documentation

## 2024-01-04 DIAGNOSIS — Z794 Long term (current) use of insulin: Secondary | ICD-10-CM | POA: Insufficient documentation

## 2024-01-04 DIAGNOSIS — Z86718 Personal history of other venous thrombosis and embolism: Secondary | ICD-10-CM | POA: Diagnosis not present

## 2024-01-04 DIAGNOSIS — Z7984 Long term (current) use of oral hypoglycemic drugs: Secondary | ICD-10-CM | POA: Insufficient documentation

## 2024-01-04 DIAGNOSIS — M79673 Pain in unspecified foot: Secondary | ICD-10-CM | POA: Diagnosis not present

## 2024-01-04 LAB — CBC WITH DIFFERENTIAL (CANCER CENTER ONLY)
Abs Immature Granulocytes: 0.03 K/uL (ref 0.00–0.07)
Basophils Absolute: 0.1 K/uL (ref 0.0–0.1)
Basophils Relative: 1 %
Eosinophils Absolute: 0.5 K/uL (ref 0.0–0.5)
Eosinophils Relative: 7 %
HCT: 35.1 % — ABNORMAL LOW (ref 39.0–52.0)
Hemoglobin: 11 g/dL — ABNORMAL LOW (ref 13.0–17.0)
Immature Granulocytes: 0 %
Lymphocytes Relative: 23 %
Lymphs Abs: 1.6 K/uL (ref 0.7–4.0)
MCH: 24.1 pg — ABNORMAL LOW (ref 26.0–34.0)
MCHC: 31.3 g/dL (ref 30.0–36.0)
MCV: 77 fL — ABNORMAL LOW (ref 80.0–100.0)
Monocytes Absolute: 0.5 K/uL (ref 0.1–1.0)
Monocytes Relative: 7 %
Neutro Abs: 4.3 K/uL (ref 1.7–7.7)
Neutrophils Relative %: 62 %
Platelet Count: 254 K/uL (ref 150–400)
RBC: 4.56 MIL/uL (ref 4.22–5.81)
RDW: 18.5 % — ABNORMAL HIGH (ref 11.5–15.5)
WBC Count: 6.9 K/uL (ref 4.0–10.5)
nRBC: 0 % (ref 0.0–0.2)

## 2024-01-04 LAB — VITAMIN B12: Vitamin B-12: 376 pg/mL (ref 180–914)

## 2024-01-04 LAB — IRON AND IRON BINDING CAPACITY (CC-WL,HP ONLY)
Iron: 38 ug/dL — ABNORMAL LOW (ref 45–182)
Saturation Ratios: 7 % — ABNORMAL LOW (ref 17.9–39.5)
TIBC: 528 ug/dL — ABNORMAL HIGH (ref 250–450)
UIBC: 490 ug/dL — ABNORMAL HIGH (ref 117–376)

## 2024-01-04 LAB — FERRITIN: Ferritin: 12 ng/mL — ABNORMAL LOW (ref 24–336)

## 2024-01-04 LAB — D-DIMER, QUANTITATIVE: D-Dimer, Quant: <0.27 ug{FEU}/mL (ref 0.00–0.50)

## 2024-01-04 LAB — FOLATE: Folate: >20 ng/mL (ref >5.9)

## 2024-01-04 NOTE — Progress Notes (Signed)
 [place] [name]  HEMATOLOGY CLINIC PROGRESS NOTE  PATIENT NAME: Donald [name]   MR#: [id number] DOB: 08-11-1950  Patient Care Team: Donald Santos, MD as PCP - General (Internal Medicine) [name], Jerel, MD as PCP - Cardiology (Cardiology) Donald Izetta HERO, NP as Nurse Practitioner (Internal Medicine)  Date of [name]: 01/04/2024   ASSESSMENT & PLAN:   Donald [name] is a 73 y.o.  gentleman with a past medical history of hypertension, diabetes mellitus, dyslipidemia, IgA nephropathy, OSA on CPAP, CHF, CKD Stage 3b, was referred to our service for evaluation of possible hypercoagulable state, given bilateral pulmonary emboli and DVT of the left lower extremity, diagnosed on 07/03/2023.    Bilateral pulmonary embolism (HCC) Worsening dyspnea led to diagnosis of bilateral pulmonary emboli and left leg DVT on 07/02/2023.   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   Significant symptom improvement reported since starting anticoagulation, resumed gym activities.   Current treatment involves anticoagulation therapy with Eliquis  5 mg twice daily.    On his initial consultation with us  on 08/08/2023, we obtained thrombophilia workup with factor V Leiden mutation, prothrombin gene mutation, beta-2  glycoprotein antibodies, anticardiolipin antibodies, lupus anticoagulant testing and it was all unremarkable.  D-dimer was normal at 0.47.  Rest of the hypercoagulable workup was deferred as it can be falsely abnormal given recent thromboembolism.  This was obtained on 01/04/2024.    On 01/04/2024, request submitted for CT angiogram of the chest to follow-up on pulmonary embolism status.  Further anticoagulation recommendations will be dependent on findings.  We will call him with CT results.  Acute deep vein thrombosis (DVT) of left lower  extremity (HCC) Continue anticoagulation as mentioned above  Microcytic anemia Iron deficiency anemia with low hemoglobin levels (11 g/dL) and low iron parameters. No obvious signs of blood loss. Recent colonoscopy in January 2022 showed external hemorrhoids, diverticulosis, and a small polyp. Potential contribution from chronic kidney disease. Symptoms include tiredness and worsening energy levels. Discussed oral iron supplements versus IV iron infusion, with the latter chosen due to potential gastrointestinal side effects of oral iron. - Arrange for iron infusion therapy with Feraheme, two doses, one week apart. - Monitor hemoglobin levels and energy improvement over the next three months. - Check B12 levels and consider supplementation if low.  Chronic hypoxemia requiring supplemental oxygen  Chronic hypoxemia requiring continuous supplemental oxygen . Oxygen  use has increased from nighttime to full-time use. Symptoms include shallow breathing and decreased oxygen  saturation when sedentary. Persistent shortness of breath despite oxygen  therapy. Plan to evaluate pulmonary embolism status to assess contribution to hypoxemia. - Continue supplemental oxygen  therapy.  I spent a total of 30 minutes during this encounter with the patient including review of chart and various tests results, discussions about plan of care and coordination of care plan.  I reviewed lab results and outside records for this [name] and discussed relevant results with the patient. Diagnosis, plan of care and treatment options were also discussed in detail with the patient. Opportunity provided to ask questions and answers provided to his apparent satisfaction. Provided instructions to call our clinic with any problems, questions or concerns prior to return [name]. I recommended to continue follow-up with PCP and sub-specialists. He verbalized understanding and agreed with the plan. No barriers to learning was detected.  Donald Patten, MD  01/04/2024 5:55 PM  [name] - A DEPT OF [name] [address] [place] [address] Dept: [phone] Dept Fax: [phone]   Donald [name]:  Follow-up for history of bilateral pulm embolism.  Grossly negative thrombophilia workup.  INTERVAL HISTORY:  Discussed the use of AI scribe software for clinical note transcription with the patient, who gave verbal consent to proceed.  History of Present Illness Donald [name] is a 73 year old male with a history of blood clots and anemia who presents for follow-up on his anemia and clotting issues. He is accompanied by his wife.  He has been experiencing mild intermittent headaches, which he attributes to his strength issues and does not find concerning. He does not believe these headaches are related to his other health conditions.  He has a history of blood clots, initially discovered in February-March 2025, for which he was hospitalized and started on Eliquis  5 mg twice daily. He experiences ongoing shortness of breath and has been using oxygen  continuously since late last year. Initially, he used it only at night for over two years but now requires it throughout the day, especially when sitting. Breathing problems have been present since November 2024, with a CT scan at that time showing no clots, but symptoms persisted until clots were found in February 2025.  His hemoglobin has been slightly low since December 2024, with current levels at 11 g/dL. No obvious blood loss such as blood in stools, black stools, epistaxis, or gum bleeds. Kidney function has also been noted to be slightly low. Recent tests show low iron levels, and he has not been on iron supplements previously.  He underwent a colonoscopy in January 2022, which showed external hemorrhoids, diverticulosis, and a small polyp, but was otherwise normal. His B12 levels are  pending, but his iron levels are low, which could contribute to his tiredness and worsening symptoms.  He has been experiencing worsening tiredness and energy levels, which he attributes to his anemia. He has not been on iron supplements before and is considering iron infusions as an alternative to oral supplements due to potential side effects like constipation and heartburn.  He has been on Eliquis  since his hospitalization for blood clots and has undergone workups to check for blood mutations, all of which were negative. His D-dimer levels, a marker for clotting, were undetectable at the latest check.   SUMMARY OF HEMATOLOGIC HISTORY:  He presented to the ED on 07/02/2023 with worsening shortness of breath.  He was found to have bilateral pulmonary emboli, right greater than left on CT angiogram of the chest.  There was no evidence of right heart strain.  Ultrasound Doppler showed DVT in the left lower extremity in the posterior tibial and left peroneal veins.  PE/DVT was felt likely to be provoked by sedentary lifestyle.  He was started on Eliquis  twice daily with plan to continue at least for 6 months.  He was referred to us  for further evaluation and for recommendations regarding duration of anticoagulation.   He is accompanied by Diane, his wife.   He was not on bed rest prior to the diagnosis of DVT/PE but had reduced activity following back surgery in the spring of the previous year. His breathing difficulties began around Thanksgiving, and he was unable to exercise due to shortness of breath. He has been using oxygen  at night and a CPAP machine due to low oxygen  levels during sleep. Since starting blood thinners, his symptoms have improved significantly,  and he has returned to the gym. No current leg pain, swelling, or redness. No blood in stools or black stools.   He has a history of IgA nephropathy, which was diagnosed after blood was found in his urine. He has not seen a nephrologist  recently but has been monitored by his primary care physician. He has not received treatment for nephropathy, and his kidney function numbers have remained stable.   His social history includes being retired, spending time at home, and engaging in activities such as reading and using the computer. He attempts to work out three times a week and sees a Systems analyst twice a week. He has difficulty walking due to foot pain.   He denies fever, cough, diarrhea, or other infectious symptoms.  He denies epistaxis, bloody stool, melena, hematuria, bruising or other bleeding symptoms. He also denies unintentional weight loss, or other constitutional symptoms.   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    Significant symptom improvement reported since starting anticoagulation, resumed gym activities.    Current treatment involves anticoagulation therapy with Eliquis  5 mg twice daily.    On his initial consultation with us  on 08/08/2023, we obtained thrombophilia workup with factor V Leiden mutation, prothrombin gene mutation, beta-2  glycoprotein antibodies, anticardiolipin antibodies, lupus anticoagulant testing and it was all unremarkable.  D-dimer was normal at 0.47.  Rest of the hypercoagulable workup was deferred as it can be falsely abnormal given recent thromboembolism.  This was obtained on 01/04/2024.    On 01/04/2024, request submitted for CT angiogram of the chest to follow-up on pulmonary embolism status.  Further anticoagulation recommendations will be dependent on findings.  I have reviewed the past medical history, past surgical history, social history and family history with the patient and they are unchanged from previous note.  ALLERGIES: He is allergic to ciprofloxacin, jardiance [empagliflozin], levaquin [levofloxacin], aricept  [donepezil  hcl],  bactrim [sulfamethoxazole-trimethoprim], breztri  aerosphere [budeson-glycopyrrol-formoterol ], and misc. sulfonamide containing compounds.  MEDICATIONS:  Current Outpatient Medications  Medication Sig Dispense Refill   apixaban  (ELIQUIS ) 5 MG TABS tablet Take 2 tablets (10mg ) by mouth twice daily for 7 days, then switch to 1 tablet (5mg ) by mouth twice daily. (Patient taking differently: Take 5 mg by mouth 2 (two) times daily.) 90 tablet 3   cetirizine (ZYRTEC) 10 MG tablet Take 10 mg by mouth every evening.     clopidogrel  (PLAVIX ) 75 MG tablet Take 1 tablet (75 mg total) by mouth daily. 90 tablet 3   Continuous Blood Gluc Sensor (FREESTYLE LIBRE 14 DAY SENSOR) MISC Inject 1 Application into the skin every 14 (fourteen) days.     finasteride  (PROSCAR ) 5 MG tablet Take 5 mg by mouth in the morning.     fluticasone  (FLONASE ) 50 MCG/ACT nasal spray Place 1 spray into both nostrils daily as needed for allergies.     furosemide  (LASIX ) 40 MG tablet Take 40 mg by mouth daily.     insulin  lispro (HUMALOG  KWIKPEN) 200 UNIT/ML KwikPen Inject 30-130 Units into the skin in the morning, at noon, and at bedtime. Sliding scale If blood sugar is over 140     memantine  (NAMENDA ) 10 MG tablet Take 1 tablet (10 mg total) by mouth 2 (two) times daily. 180 tablet 3   metFORMIN  (GLUCOPHAGE ) 1000 MG tablet Take 1 tablet (1,000 mg total) by mouth 2 (two) times daily with a meal. Restart on 8/22  metoprolol  succinate (TOPROL -XL) 50 MG 24 hr tablet Take 50 mg by mouth 2 (two) times daily. Take with or immediately following a meal.     pantoprazole  (PROTONIX ) 40 MG tablet Take 1 tablet (40 mg total) by mouth 2 (two) times daily. (Patient taking differently: Take 40 mg by mouth every evening.) 180 tablet 0   rosuvastatin  (CRESTOR ) 20 MG tablet TAKE ONE TABLET BY MOUTH ONE TIME DAILY (Patient taking differently: Take 20 mg by mouth every evening.) 90 tablet 3   sacubitril -valsartan  (ENTRESTO ) 97-103 MG TAKE ONE TABLET BY  MOUTH TWICE DAILY 180 tablet 3   silodosin (RAPAFLO) 8 MG CAPS capsule Take 8 mg by mouth every evening.     spironolactone  (ALDACTONE ) 25 MG tablet Take one-half tablet by mouth daily 45 tablet 3   TRESIBA FLEXTOUCH 200 UNIT/ML FlexTouch Pen Inject 100 Units into the skin in the morning.     No current facility-administered medications for this [name].     REVIEW OF SYSTEMS:    Review of Systems - Oncology  All other pertinent systems were reviewed with the patient and are negative.  PHYSICAL EXAMINATION:    Onc Performance Status - 01/04/24 1053       ECOG Perf Status   ECOG Perf Status Ambulatory and capable of all selfcare but unable to carry out any work activities.  Up and about more than 50% of waking hours      KPS SCALE   KPS % SCORE Normal activity with effort, some s/s of disease          Vitals:   01/04/24 1038 01/04/24 1041  BP: (!) 90/51 90/60  Pulse: 93   Resp: 17   Temp: 97.6 F (36.4 C)   SpO2: 97%    Filed Weights   01/04/24 1038  Weight: 208 lb (94.3 kg)    Physical Exam Constitutional:      General: He is not in acute distress.    Appearance: Normal appearance.  HENT:     Head: Normocephalic and atraumatic.  Eyes:     Conjunctiva/sclera: Conjunctivae normal.  Cardiovascular:     Rate and Rhythm: Normal rate and regular rhythm.     Heart sounds: Normal heart sounds.  Pulmonary:     Effort: Pulmonary effort is normal. No respiratory distress.     Comments: Wearing O2 via [place] Abdominal:     General: There is no distension.  Neurological:     General: No focal deficit present.     Mental Status: He is alert and oriented to person, place, and time.  Psychiatric:        Mood and Affect: Mood normal.        Behavior: Behavior normal.     LABORATORY DATA:   I have reviewed the data as listed.  Results for orders placed or performed in [name] on 01/04/24  D-dimer, quantitative  Result Value Ref Range   D-Dimer, Quant <0.27 0.00 -  0.50 ug/mL-FEU  Folate  Result Value Ref Range   Folate >20.0 >5.9 ng/mL  Vitamin B12  Result Value Ref Range   Vitamin B-12 376 180 - 914 pg/mL  Ferritin  Result Value Ref Range   Ferritin 12 (L) 24 - 336 ng/mL  Iron and Iron Binding Capacity ([facility] only)  Result Value Ref Range   Iron 38 (L) 45 - 182 ug/dL   TIBC 471 (H) 749 - 549 ug/dL   Saturation Ratios 7 (L) 17.9 - 39.5 %   UIBC  490 (H) 117 - 376 ug/dL  CBC with Differential ([name] Only)  Result Value Ref Range   WBC Count 6.9 4.0 - 10.5 K/uL   RBC 4.56 4.22 - 5.81 MIL/uL   Hemoglobin 11.0 (L) 13.0 - 17.0 g/dL   HCT 64.8 (L) 60.9 - 47.9 %   MCV 77.0 (L) 80.0 - 100.0 fL   MCH 24.1 (L) 26.0 - 34.0 pg   MCHC 31.3 30.0 - 36.0 g/dL   RDW 81.4 (H) 88.4 - 84.4 %   Platelet Count 254 150 - 400 K/uL   nRBC 0.0 0.0 - 0.2 %   Neutrophils Relative % 62 %   Neutro Abs 4.3 1.7 - 7.7 K/uL   Lymphocytes Relative 23 %   Lymphs Abs 1.6 0.7 - 4.0 K/uL   Monocytes Relative 7 %   Monocytes Absolute 0.5 0.1 - 1.0 K/uL   Eosinophils Relative 7 %   Eosinophils Absolute 0.5 0.0 - 0.5 K/uL   Basophils Relative 1 %   Basophils Absolute 0.1 0.0 - 0.1 K/uL   Immature Granulocytes 0 %   Abs Immature Granulocytes 0.03 0.00 - 0.07 K/uL    RADIOGRAPHIC STUDIES:  No recent pertinent imaging studies available to review.  Orders Placed This Encounter  Procedures   CT Angio Chest Pulmonary Embolism (PE) W or WO Contrast    Wt:205 /yes diabetic/ IGA Nephro/ yes both kidneys No spinal stimulator/no body injector/yes glucose pt is aware to remove for appt or heart monitor/ no port No special needs/ Allergic to contrast NO Ins: HTA S/w patients wife  epic  order/LM pt aware of $75 no show fee    Standing Status:   Future    Expected Date:   01/10/2024    Expiration Date:   01/03/2025    If indicated for the ordered procedure, I authorize the administration of contrast media per Radiology protocol:   Yes    Does the patient have a  contrast media/X-ray dye allergy?:   No    Preferred imaging location?:   [facility]     Future Appointments  Date Time Provider Department [name]  01/11/2024 10:00 AM [facility] 1 [facility] [facility]  01/16/2024 10:45 AM [facility] 3 [facility] None  01/18/2024  8:45 AM Ogechi Kuehnel, MD [facility] None  01/23/2024  9:00 AM [facility] 2 [facility] None  03/28/2024  8:45 AM [facility] LAB [facility] None  03/28/2024  9:15 AM Dedee Liss, MD [facility] None  03/31/2024  9:40 AM [name], Jerel, MD [facility] H&V  04/14/2024 11:00 AM Lomax, Amy, NP [facility] None  04/18/2024 10:00 AM [facility] 10 [facility] H&V  04/18/2024 11:00 AM [facility] 10 [facility] H&V  04/18/2024 11:40 AM Pearline Norman RAMAN, MD [facility] H&V     This document was completed utilizing speech recognition software. Grammatical errors, random word insertions, pronoun errors, and incomplete sentences are an occasional consequence of this system due to software limitations, ambient noise, and hardware issues. Any formal questions or concerns about the content, text or information contained within the body of this dictation should be directly addressed to the provider for clarification.

## 2024-01-04 NOTE — Assessment & Plan Note (Signed)
 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   Significant symptom improvement reported since starting anticoagulation, resumed gym activities.   Current treatment involves anticoagulation therapy with Eliquis  5 mg twice daily.    On his initial consultation with us  on 08/08/2023, we obtained thrombophilia workup with factor V Leiden mutation, prothrombin gene mutation, beta-2  glycoprotein antibodies, anticardiolipin antibodies, lupus anticoagulant testing and it was all unremarkable.  D-dimer was normal at 0.47.  Rest of the hypercoagulable workup was deferred as it can be falsely abnormal given recent thromboembolism.  This was obtained on 01/04/2024.    On 01/04/2024, request submitted for CT angiogram of the chest to follow-up on pulmonary embolism status.  Further anticoagulation recommendations will be dependent on findings.  We will call him with CT results.

## 2024-01-04 NOTE — Assessment & Plan Note (Signed)
 Iron deficiency anemia with low hemoglobin levels (11 g/dL) and low iron parameters. No obvious signs of blood loss. Recent colonoscopy in January 2022 showed external hemorrhoids, diverticulosis, and a small polyp. Potential contribution from chronic kidney disease. Symptoms include tiredness and worsening energy levels. Discussed oral iron supplements versus IV iron infusion, with the latter chosen due to potential gastrointestinal side effects of oral iron. - Arrange for iron infusion therapy with Feraheme, two doses, one week apart. - Monitor hemoglobin levels and energy improvement over the next three months. - Check B12 levels and consider supplementation if low.

## 2024-01-04 NOTE — Telephone Encounter (Signed)
 Auth Submission: NO AUTH NEEDED Site of care: Site of care: [facility] Payer: [employer] Medication & CPT/J Code(s) submitted: Feraheme (ferumoxytol) [id number] Diagnosis Code:  Route of submission (phone, fax, portal):  Phone # Fax # Auth type: Buy/Bill PB Units/visits requested: X2 DOSES Reference number:  Approval from: 01/04/24 to 04/05/24

## 2024-01-04 NOTE — Assessment & Plan Note (Signed)
 Continue anticoagulation as mentioned above

## 2024-01-05 LAB — PROTEIN S PANEL
Protein S Activity: 110 % (ref 63–140)
Protein S Ag, Free: 157 % — ABNORMAL HIGH (ref 61–136)
Protein S Ag, Total: 132 % (ref 60–150)

## 2024-01-05 LAB — ANTITHROMBIN PANEL
AT III AG PPP IMM-ACNC: 95 % (ref 72–124)
Antithrombin Activity: 138 % — ABNORMAL HIGH (ref 75–135)

## 2024-01-08 DIAGNOSIS — Z1212 Encounter for screening for malignant neoplasm of rectum: Secondary | ICD-10-CM | POA: Diagnosis not present

## 2024-01-08 DIAGNOSIS — D649 Anemia, unspecified: Secondary | ICD-10-CM | POA: Diagnosis not present

## 2024-01-08 DIAGNOSIS — I5042 Chronic combined systolic (congestive) and diastolic (congestive) heart failure: Secondary | ICD-10-CM | POA: Diagnosis not present

## 2024-01-08 DIAGNOSIS — E785 Hyperlipidemia, unspecified: Secondary | ICD-10-CM | POA: Diagnosis not present

## 2024-01-08 DIAGNOSIS — E7849 Other hyperlipidemia: Secondary | ICD-10-CM | POA: Diagnosis not present

## 2024-01-08 DIAGNOSIS — N182 Chronic kidney disease, stage 2 (mild): Secondary | ICD-10-CM | POA: Diagnosis not present

## 2024-01-08 DIAGNOSIS — E1122 Type 2 diabetes mellitus with diabetic chronic kidney disease: Secondary | ICD-10-CM | POA: Diagnosis not present

## 2024-01-08 DIAGNOSIS — I130 Hypertensive heart and chronic kidney disease with heart failure and stage 1 through stage 4 chronic kidney disease, or unspecified chronic kidney disease: Secondary | ICD-10-CM | POA: Diagnosis not present

## 2024-01-08 DIAGNOSIS — Z0189 Encounter for other specified special examinations: Secondary | ICD-10-CM | POA: Diagnosis not present

## 2024-01-08 LAB — HEMOGLOBIN A1C: A1c: 9.1

## 2024-01-08 LAB — COMPREHENSIVE METABOLIC PANEL (CC13)

## 2024-01-09 LAB — PROTEIN C, TOTAL: Protein C, Total: 168 % — ABNORMAL HIGH (ref 60–150)

## 2024-01-09 LAB — TSH: TSH: 1.72 (ref 0.41–5.90)

## 2024-01-11 ENCOUNTER — Ambulatory Visit
Admission: RE | Admit: 2024-01-11 | Discharge: 2024-01-11 | Disposition: A | Discharge status: Home / Self Care | Source: Ambulatory Visit | Attending: Oncology | Admitting: Oncology

## 2024-01-11 DIAGNOSIS — R0602 Shortness of breath: Secondary | ICD-10-CM | POA: Diagnosis not present

## 2024-01-11 DIAGNOSIS — I2699 Other pulmonary embolism without acute cor pulmonale: Secondary | ICD-10-CM

## 2024-01-11 MED ORDER — IOPAMIDOL (ISOVUE-370) INJECTION 76%
70.0000 mL | Freq: Once | INTRAVENOUS | Status: AC | PRN
  Administered 2024-01-11: 70 mL via INTRAVENOUS

## 2024-01-14 ENCOUNTER — Other Ambulatory Visit: Payer: Self-pay | Admitting: Internal Medicine

## 2024-01-14 DIAGNOSIS — R490 Dysphonia: Secondary | ICD-10-CM | POA: Diagnosis not present

## 2024-01-14 DIAGNOSIS — I509 Heart failure, unspecified: Secondary | ICD-10-CM | POA: Diagnosis not present

## 2024-01-14 DIAGNOSIS — R0609 Other forms of dyspnea: Secondary | ICD-10-CM | POA: Diagnosis not present

## 2024-01-14 DIAGNOSIS — G4733 Obstructive sleep apnea (adult) (pediatric): Secondary | ICD-10-CM | POA: Diagnosis not present

## 2024-01-14 DIAGNOSIS — R0902 Hypoxemia: Secondary | ICD-10-CM | POA: Diagnosis not present

## 2024-01-14 DIAGNOSIS — Z1331 Encounter for screening for depression: Secondary | ICD-10-CM | POA: Diagnosis not present

## 2024-01-14 DIAGNOSIS — Z7189 Other specified counseling: Secondary | ICD-10-CM | POA: Diagnosis not present

## 2024-01-14 DIAGNOSIS — I2510 Atherosclerotic heart disease of native coronary artery without angina pectoris: Secondary | ICD-10-CM | POA: Diagnosis not present

## 2024-01-14 DIAGNOSIS — I2699 Other pulmonary embolism without acute cor pulmonale: Secondary | ICD-10-CM | POA: Diagnosis not present

## 2024-01-14 DIAGNOSIS — Z794 Long term (current) use of insulin: Secondary | ICD-10-CM | POA: Diagnosis not present

## 2024-01-14 DIAGNOSIS — N1832 Chronic kidney disease, stage 3b: Secondary | ICD-10-CM

## 2024-01-14 DIAGNOSIS — I724 Aneurysm of artery of lower extremity: Secondary | ICD-10-CM | POA: Diagnosis not present

## 2024-01-14 DIAGNOSIS — J9610 Chronic respiratory failure, unspecified whether with hypoxia or hypercapnia: Secondary | ICD-10-CM | POA: Diagnosis not present

## 2024-01-14 DIAGNOSIS — I130 Hypertensive heart and chronic kidney disease with heart failure and stage 1 through stage 4 chronic kidney disease, or unspecified chronic kidney disease: Secondary | ICD-10-CM | POA: Diagnosis not present

## 2024-01-14 DIAGNOSIS — I5042 Chronic combined systolic (congestive) and diastolic (congestive) heart failure: Secondary | ICD-10-CM | POA: Diagnosis not present

## 2024-01-14 DIAGNOSIS — Z1339 Encounter for screening examination for other mental health and behavioral disorders: Secondary | ICD-10-CM | POA: Diagnosis not present

## 2024-01-14 DIAGNOSIS — E118 Type 2 diabetes mellitus with unspecified complications: Secondary | ICD-10-CM | POA: Diagnosis not present

## 2024-01-14 DIAGNOSIS — Z0000 Encounter for general adult medical examination without abnormal findings: Secondary | ICD-10-CM | POA: Diagnosis not present

## 2024-01-14 DIAGNOSIS — R2681 Unsteadiness on feet: Secondary | ICD-10-CM | POA: Diagnosis not present

## 2024-01-15 LAB — FECAL OCCULT BLOOD, IMMUNOCHEMICAL: Fecal Occult Blood: NEGATIVE

## 2024-01-16 ENCOUNTER — Ambulatory Visit (INDEPENDENT_AMBULATORY_CARE_PROVIDER_SITE_OTHER)

## 2024-01-16 ENCOUNTER — Encounter: Payer: Self-pay | Admitting: Cardiovascular Disease

## 2024-01-16 VITALS — BP 112/69 | HR 85 | Temp 97.8°F | Resp 12 | Ht 69.0 in | Wt 208.6 lb

## 2024-01-16 DIAGNOSIS — D509 Iron deficiency anemia, unspecified: Secondary | ICD-10-CM | POA: Diagnosis not present

## 2024-01-16 DIAGNOSIS — R0602 Shortness of breath: Secondary | ICD-10-CM

## 2024-01-16 MED ORDER — SODIUM CHLORIDE 0.9 % IV SOLN
510.0000 mg | Freq: Once | INTRAVENOUS | Status: AC
  Administered 2024-01-16: 510 mg via INTRAVENOUS
  Filled 2024-01-16: qty 17

## 2024-01-16 MED ORDER — DIPHENHYDRAMINE HCL 25 MG PO CAPS: 25.0000 mg | ORAL_CAPSULE | Freq: Once | ORAL | Status: DC

## 2024-01-16 MED ORDER — ACETAMINOPHEN 325 MG PO TABS: 650.0000 mg | ORAL_TABLET | Freq: Once | ORAL | Status: DC

## 2024-01-16 NOTE — Progress Notes (Signed)
 Diagnosis: Iron Deficiency Anemia  Provider:  Praveen Mannam MD  Procedure: IV Infusion  IV Type: Peripheral, IV Location: L Antecubital  Feraheme (Ferumoxytol ), Dose: 510 mg  Infusion Start Time: 1122  Infusion Stop Time: 1138  Post Infusion IV Care: Observation period completed and Peripheral IV Discontinued  Discharge: Condition: Good, Destination: Home . AVS Provided  Performed by:  Leita FORBES Miles, LPN

## 2024-01-17 ENCOUNTER — Encounter: Payer: Self-pay | Admitting: Cardiovascular Disease

## 2024-01-17 NOTE — Telephone Encounter (Signed)
 Please order an echocardiogram for shortness of breath.

## 2024-01-18 ENCOUNTER — Encounter: Payer: Self-pay | Admitting: Oncology

## 2024-01-18 ENCOUNTER — Inpatient Hospital Stay: Attending: Oncology | Admitting: Oncology

## 2024-01-18 DIAGNOSIS — D509 Iron deficiency anemia, unspecified: Secondary | ICD-10-CM

## 2024-01-18 DIAGNOSIS — I82492 Acute embolism and thrombosis of other specified deep vein of left lower extremity: Secondary | ICD-10-CM

## 2024-01-18 DIAGNOSIS — I2699 Other pulmonary embolism without acute cor pulmonale: Secondary | ICD-10-CM

## 2024-01-18 LAB — MICROALBUMIN/CREATININE RATIO, UR
Albumin, Urine POC: 30
Creatinine, POC: 27.2 mg/dL
Microalb Creat Ratio: 110

## 2024-01-18 NOTE — Assessment & Plan Note (Signed)
 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   Significant symptom improvement reported since starting anticoagulation, resumed gym activities.   Current treatment involves anticoagulation therapy with Eliquis  5 mg twice daily.    On his initial consultation with us  on 08/08/2023, we obtained thrombophilia workup with factor V Leiden mutation, prothrombin gene mutation, beta-2  glycoprotein antibodies, anticardiolipin antibodies, lupus anticoagulant testing and it was all unremarkable.  D-dimer was normal at 0.47.  Rest of the hypercoagulable workup was obtained on 01/04/2024-including protein C activity, protein S activity, Antithrombin III  activity and they were all within normal limits.  Repeat CT angiogram of the chest on 01/11/2024 showed no evidence of pulmonary embolism.  Since he completed at least 66-month course of anticoagulation and given no evidence of pulmonary embolism on CT angiogram of the chest, it is safe for him to discontinue anticoagulation at this time since thrombophilia workup was negative and given the fact that he has ongoing iron deficiency anemia.  He was advised to follow-up with his cardiologist regarding consideration of aspirin  81 mg daily for clot prevention.  He understands that in case of recurrence of DVT or PE, he needs to remain on anticoagulation for life.

## 2024-01-18 NOTE — Assessment & Plan Note (Signed)
 Iron deficiency anemia with low hemoglobin levels (11 g/dL) and low iron parameters. No obvious signs of blood loss. Recent colonoscopy in January 2022 showed external hemorrhoids, diverticulosis, and a small polyp. Potential contribution from chronic kidney disease. Symptoms include tiredness and worsening energy levels.   Recent CT chest showed no residual pulmonary embolism, suggesting anemia as a cause for symptoms.  - Continue iron infusions as scheduled - Monitor hemoglobin and iron levels in three months  - Coordinate with primary care for follow-up on kidney function and potential impact on anemia

## 2024-01-18 NOTE — Progress Notes (Signed)
 [facility]  HEMATOLOGY-ONCOLOGY ELECTRONIC VISIT PROGRESS NOTE  PATIENT NAME: Donald [name]   MR#: [id number] DOB: [birth year]  DATE OF SERVICE: 01/18/2024  Patient Care Team: Avva, Ravisankar, MD as PCP - General (Internal Medicine) Croitoru, Jerel, MD as PCP - Cardiology (Cardiology) Verta Izetta HERO, NP as Nurse Practitioner (Internal Medicine)  I connected with the patient via telephone conference and verified that I am speaking with the correct person using two identifiers. The patient's location is at home and I am providing care from the [facility].  I discussed the limitations, risks, security and privacy concerns of performing an evaluation and management service by e-visits and the availability of in person appointments. I also discussed with the patient that there may be a patient responsible charge related to this service. The patient expressed understanding and agreed to proceed.   ASSESSMENT & PLAN:   Donald [name] is a 73 y.o. gentleman with a past medical history of hypertension, diabetes mellitus, dyslipidemia, IgA nephropathy, OSA on CPAP, CHF, CKD Stage 3b, was referred to our service for evaluation of possible hypercoagulable state, given bilateral pulmonary emboli and DVT of the left lower extremity, diagnosed on 07/03/2023.   Hx of Bilateral pulmonary embolism (HCC) Worsening dyspnea led to diagnosis of bilateral pulmonary emboli and left leg DVT on 07/02/2023.   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   Significant symptom improvement reported since starting anticoagulation, resumed gym activities.   Current treatment involves anticoagulation therapy with Eliquis  5 mg twice daily.    On his initial consultation with us  on 08/08/2023, we obtained thrombophilia workup with factor V Leiden  mutation, prothrombin gene mutation, beta-2  glycoprotein antibodies, anticardiolipin antibodies, lupus anticoagulant testing and it was all unremarkable.  D-dimer was normal at 0.47.  Rest of the hypercoagulable workup was obtained on 01/04/2024-including protein C activity, protein S activity, Antithrombin III  activity and they were all within normal limits.  Repeat CT angiogram of the chest on 01/11/2024 showed no evidence of pulmonary embolism.  Since he completed at least 28-month course of anticoagulation and given no evidence of pulmonary embolism on CT angiogram of the chest, it is safe for him to discontinue anticoagulation at this time since thrombophilia workup was negative and given the fact that he has ongoing iron deficiency anemia.  He was advised to follow-up with his cardiologist regarding consideration of aspirin  81 mg daily for clot prevention.  He understands that in case of recurrence of DVT or PE, he needs to remain on anticoagulation for life.  Microcytic anemia Iron deficiency anemia with low hemoglobin levels (11 g/dL) and low iron parameters. No obvious signs of blood loss. Recent colonoscopy in January 2022 showed external hemorrhoids, diverticulosis, and a small polyp. Potential contribution from chronic kidney disease. Symptoms include tiredness and worsening energy levels.   Recent CT chest showed no residual pulmonary embolism, suggesting anemia as a cause for symptoms.  - Continue iron infusions as scheduled - Monitor hemoglobin and iron levels in three months  - Coordinate with primary care for follow-up on kidney function and potential impact on anemia   I discussed the assessment and treatment plan with the patient. The patient was provided an opportunity to ask questions and all were answered. The patient agreed with the plan and demonstrated an understanding of the instructions. The patient was advised to call back or seek an in-person evaluation  if the symptoms  worsen or if the condition fails to improve as anticipated.    I spent 21 minutes over the phone with the patient reviewing test results, discuss management and coordination/planning of care.  Chinita Patten, MD 01/18/2024 4:44 PM [facility] - A DEPT OF [facility] [address] [place] [address] Dept: [phone] Dept Fax: [phone]   INTERVAL HISTORY:  Please see above for problem oriented charting.  The purpose of today's discussion is to explain recent lab results and to formulate plan of care.  Discussed the use of AI scribe software for clinical note transcription with the patient, who gave verbal consent to proceed.  History of Present Illness Donald [name] is a 73 year old male with iron deficiency anemia who presents with fatigue.  He has been experiencing persistent fatigue for several weeks. Recent laboratory tests revealed low iron levels and a hemoglobin level of 11. He recently received an iron infusion and is scheduled for another next week.  During an annual checkup earlier this week, he was told that his kidney numbers were not good. He is awaiting an appointment with a nephrologist.  He has been experiencing fluctuations in blood glucose levels, requiring increased insulin  use. Ozempic has been started.  He has a history of pulmonary embolism, and was told that a recent CT scan of the chest did not show any residual clots. He has been on Eliquis  for over six months. He is also on Plavix  and does not take aspirin .  He had a colonoscopy three years ago, which revealed polyps, and a repeat colonoscopy was recommended. However, it was postponed due to his current health issues. He plans to schedule it soon, considering his low iron levels.  He is scheduled for an echocardiogram.   SUMMARY OF HEMATOLOGY HISTORY:  He presented to the ED on 07/02/2023 with worsening shortness of  breath.  He was found to have bilateral pulmonary emboli, right greater than left on CT angiogram of the chest.  There was no evidence of right heart strain.  Ultrasound Doppler showed DVT in the left lower extremity in the posterior tibial and left peroneal veins.  PE/DVT was felt likely to be provoked by sedentary lifestyle.  He was started on Eliquis  twice daily with plan to continue at least for 6 months.  He was referred to us  for further evaluation and for recommendations regarding duration of anticoagulation.   He is accompanied by Diane, his wife.   He was not on bed rest prior to the diagnosis of DVT/PE but had reduced activity following back surgery in the spring of the previous year. His breathing difficulties began around Thanksgiving, and he was unable to exercise due to shortness of breath. He has been using oxygen  at night and a CPAP machine due to low oxygen  levels during sleep. Since starting blood thinners, his symptoms have improved significantly, and he has returned to the gym. No current leg pain, swelling, or redness. No blood in stools or black stools.   He has a history of IgA nephropathy, which was diagnosed after blood was found in his urine. He has not seen a nephrologist recently but has been monitored by his primary care physician. He has not received treatment for nephropathy, and his kidney function numbers have remained stable.   His social history includes being retired, spending time at home, and engaging in activities such as reading and using  the computer. He attempts to work out three times a week and sees a Systems analyst twice a week. He has difficulty walking due to foot pain.   He denies fever, cough, diarrhea, or other infectious symptoms.  He denies epistaxis, bloody stool, melena, hematuria, bruising or other bleeding symptoms. He also denies unintentional weight loss, or other constitutional symptoms.   Etiology unclear, with potential factors including  decreased activity post-back surgery and possible loss of anticoagulant proteins and  profibrinolytic proteins related to IgA nephropathy.    Significant symptom improvement reported since starting anticoagulation, resumed gym activities.    Current treatment involves anticoagulation therapy with Eliquis  5 mg twice daily.    On his initial consultation with us  on 08/08/2023, we obtained thrombophilia workup with factor V Leiden mutation, prothrombin gene mutation, beta-2  glycoprotein antibodies, anticardiolipin antibodies, lupus anticoagulant testing and it was all unremarkable.  D-dimer was normal at 0.47.  Rest of the hypercoagulable workup was deferred as it can be falsely abnormal given recent thromboembolism.  This was obtained on 01/04/2024.     On 01/04/2024, request submitted for CT angiogram of the chest to follow-up on pulmonary embolism status.  Further anticoagulation recommendations will be dependent on findings.   REVIEW OF SYSTEMS:    Review of Systems - Oncology  All other pertinent systems were reviewed with the patient and are negative.  I have reviewed the past medical history, past surgical history, social history and family history with the patient and they are unchanged from previous note.  ALLERGIES:  He is allergic to ciprofloxacin, jardiance [empagliflozin], levaquin [levofloxacin], aricept  [donepezil  hcl], bactrim [sulfamethoxazole-trimethoprim], breztri  aerosphere [budeson-glycopyrrol-formoterol ], and misc. sulfonamide containing compounds.  MEDICATIONS:  Current Outpatient Medications  Medication Sig Dispense Refill   Semaglutide,0.25 or 0.5MG /DOS, (OZEMPIC, 0.25 OR 0.5 MG/DOSE,) 2 MG/3ML SOPN as directed Subcutaneous     apixaban  (ELIQUIS ) 5 MG TABS tablet Take 2 tablets (10mg ) by mouth twice daily for 7 days, then switch to 1 tablet (5mg ) by mouth twice daily. (Patient taking differently: Take 5 mg by mouth 2 (two) times daily.) 90 tablet 3   cetirizine (ZYRTEC) 10  MG tablet Take 10 mg by mouth every evening.     clopidogrel  (PLAVIX ) 75 MG tablet Take 1 tablet (75 mg total) by mouth daily. 90 tablet 3   Continuous Blood Gluc Sensor (FREESTYLE LIBRE 14 DAY SENSOR) MISC Inject 1 Application into the skin every 14 (fourteen) days.     finasteride  (PROSCAR ) 5 MG tablet Take 5 mg by mouth in the morning.     fluticasone  (FLONASE ) 50 MCG/ACT nasal spray Place 1 spray into both nostrils daily as needed for allergies.     furosemide  (LASIX ) 40 MG tablet Take 40 mg by mouth daily.     insulin  lispro (HUMALOG  KWIKPEN) 200 UNIT/ML KwikPen Inject 30-130 Units into the skin in the morning, at noon, and at bedtime. Sliding scale If blood sugar is over 140     memantine  (NAMENDA ) 10 MG tablet Take 1 tablet (10 mg total) by mouth 2 (two) times daily. 180 tablet 3   metFORMIN  (GLUCOPHAGE ) 1000 MG tablet Take 1 tablet (1,000 mg total) by mouth 2 (two) times daily with a meal. Restart on 8/22     metoprolol  succinate (TOPROL -XL) 50 MG 24 hr tablet Take 50 mg by mouth 2 (two) times daily. Take with or immediately following a meal.     pantoprazole  (PROTONIX ) 40 MG tablet Take 1 tablet (40 mg total) by mouth 2 (two) times  daily. (Patient taking differently: Take 40 mg by mouth every evening.) 180 tablet 0   rosuvastatin  (CRESTOR ) 20 MG tablet TAKE ONE TABLET BY MOUTH ONE TIME DAILY (Patient taking differently: Take 20 mg by mouth every evening.) 90 tablet 3   sacubitril -valsartan  (ENTRESTO ) 97-103 MG TAKE ONE TABLET BY MOUTH TWICE DAILY 180 tablet 3   silodosin (RAPAFLO) 8 MG CAPS capsule Take 8 mg by mouth every evening.     spironolactone  (ALDACTONE ) 25 MG tablet Take one-half tablet by mouth daily 45 tablet 3   TRESIBA FLEXTOUCH 200 UNIT/ML FlexTouch Pen Inject 100 Units into the skin in the morning.     No current facility-administered medications for this visit.    PHYSICAL EXAMINATION:   Onc Performance Status - 01/18/24 0900       ECOG Perf Status   ECOG Perf  Status Ambulatory and capable of all selfcare but unable to carry out any work activities.  Up and about more than 50% of waking hours      KPS SCALE   KPS % SCORE Normal activity with effort, some s/s of disease          LABORATORY DATA:   I have reviewed the data as listed.  Recent Results (from the past 2160 hours)  TSH     Status: None   Collection Time: 11/13/23  1:58 PM  Result Value Ref Range   TSH 1.33 0.35 - 5.50 uIU/mL  Basic metabolic panel with GFR     Status: Abnormal   Collection Time: 11/13/23  1:58 PM  Result Value Ref Range   Sodium 139 135 - 145 mEq/L   Potassium 3.6 3.5 - 5.1 mEq/L   Chloride 98 96 - 112 mEq/L   CO2 20 19 - 32 mEq/L   Glucose, Bld 126 (H) 70 - 99 mg/dL   BUN 62 (H) 6 - 23 mg/dL   Creatinine, Ser 7.73 (H) 0.40 - 1.50 mg/dL   GFR 71.53 (L) >39.99 mL/min    Comment: Calculated using the CKD-EPI Creatinine Equation (2021)   Calcium  9.8 8.4 - 10.5 mg/dL  Brain natriuretic peptide     Status: None   Collection Time: 11/13/23  1:58 PM  Result Value Ref Range   Pro B Natriuretic peptide (BNP) 7.0 0.0 - 100.0 pg/mL  CBC with Differential/Platelet     Status: Abnormal   Collection Time: 11/13/23  1:58 PM  Result Value Ref Range   WBC 10.4 4.0 - 10.5 K/uL   RBC 4.85 4.22 - 5.81 Mil/uL   Hemoglobin 11.4 (L) 13.0 - 17.0 g/dL   HCT 64.4 (L) 60.9 - 47.9 %   MCV 73.1 (L) 78.0 - 100.0 fl   MCHC 32.1 30.0 - 36.0 g/dL   RDW 79.6 (H) 88.4 - 84.4 %   Platelets 290.0 150.0 - 400.0 K/uL   Neutrophils Relative % 64.9 43.0 - 77.0 %   Lymphocytes Relative 21.2 12.0 - 46.0 %   Monocytes Relative 9.3 3.0 - 12.0 %   Eosinophils Relative 3.8 0.0 - 5.0 %   Basophils Relative 0.8 0.0 - 3.0 %   Neutro Abs 6.8 1.4 - 7.7 K/uL   Lymphs Abs 2.2 0.7 - 4.0 K/uL   Monocytes Absolute 1.0 0.1 - 1.0 K/uL   Eosinophils Absolute 0.4 0.0 - 0.7 K/uL   Basophils Absolute 0.1 0.0 - 0.1 K/uL  VAS US  ABI WITH/WO TBI     Status: None   Collection Time: 11/23/23  2:03 PM   Result  Value Ref Range   Right ABI 0.94    Left ABI 1.11   D-dimer, quantitative     Status: None   Collection Time: 01/04/24 10:00 AM  Result Value Ref Range   D-Dimer, Quant <0.27 0.00 - 0.50 ug/mL-FEU    Comment: (NOTE) At the manufacturer cut-off value of 0.5 g/mL FEU, this assay has a negative predictive value of 95-100%.This assay is intended for use in conjunction with a clinical pretest probability (PTP) assessment model to exclude pulmonary embolism (PE) and deep venous thrombosis (DVT) in outpatients suspected of PE or DVT. Results should be correlated with clinical presentation. Performed at [facility], [address], [place], [place] [address]   Antithrombin panel     Status: Abnormal   Collection Time: 01/04/24 10:00 AM  Result Value Ref Range   Antithrombin Activity 138 (H) 75 - 135 %    Comment: (NOTE) An elevated antithrombin activity is of no known clinical significance. Direct Xa inhibitor anticoagulants such as rivaroxaban, apixaban  and edoxaban will lead to spuriously elevated antithrombin activity levels possibly masking a deficiency.    AT III AG PPP IMM-ACNC 95 72 - 124 %    Comment: (NOTE) This test was developed and its performance characteristics determined by [facility]. It has not been cleared or approved by the Food and Drug Administration. Performed At: [facility] [address] [place], [place] [id number] Jennette Shorter MD Ey:[id number]   PROTEIN S PANEL     Status: Abnormal   Collection Time: 01/04/24 10:00 AM  Result Value Ref Range   Protein S Ag, Total 132 60 - 150 %    Comment: (NOTE) This test was developed and its performance characteristics determined by [facility]. It has not been cleared or approved by the Food and Drug Administration.    Protein S Ag, Free 157 (H) 61 - 136 %   Protein S Activity 110 63 - 140 %    Comment: (NOTE) Protein S activity may be falsely increased (masking an abnormal,  low result) in patients receiving direct Xa inhibitor (e.g., rivaroxaban, apixaban , edoxaban) or a direct thrombin  inhibitor (e.g., dabigatran) anticoagulant treatment due to assay interference by these drugs. Performed At: [facility] [address] [place], [place] [id number] Jennette Shorter MD Ey:[id number]   Protein C, total     Status: Abnormal   Collection Time: 01/04/24 10:00 AM  Result Value Ref Range   Protein C, Total 168 (H) 60 - 150 %    Comment: (NOTE) Performed At: [facility] [address] [place], [place] [id number] Jennette Shorter MD Ey:[id number]   Folate     Status: None   Collection Time: 01/04/24 10:00 AM  Result Value Ref Range   Folate >20.0 >5.9 ng/mL    Comment: Results confirmed by automated dilution  Performed at [facility], [address] [place], [place] [address]   Vitamin B12     Status: None   Collection Time: 01/04/24 10:00 AM  Result Value Ref Range   Vitamin B-12 376 180 - 914 pg/mL    Comment: Performed at [facility], [address], [place], [place] [address]  Ferritin     Status: Abnormal   Collection Time: 01/04/24 10:00 AM  Result Value Ref Range   Ferritin 12 (L) 24 - 336 ng/mL    Comment: Performed at [facility], [address] [place], [place] [address]  Iron and Iron Binding Capacity ([facility] only)  Status: Abnormal   Collection Time: 01/04/24 10:00 AM  Result Value Ref Range   Iron 38 (L) 45 - 182 ug/dL   TIBC 471 (H) 749 - 549 ug/dL   Saturation Ratios 7 (L) 17.9 - 39.5 %   UIBC 490 (H) 117 - 376 ug/dL    Comment: Performed at [facility], [address] [place], [place] [address]  CBC with Differential (Cancer Center Only)     Status: Abnormal   Collection Time: 01/04/24 10:00 AM  Result Value Ref Range   WBC Count 6.9 4.0 - 10.5 K/uL   RBC 4.56 4.22 - 5.81 MIL/uL   Hemoglobin 11.0 (L) 13.0 - 17.0 g/dL   HCT  64.8 (L) 60.9 - 52.0 %   MCV 77.0 (L) 80.0 - 100.0 fL   MCH 24.1 (L) 26.0 - 34.0 pg   MCHC 31.3 30.0 - 36.0 g/dL   RDW 81.4 (H) 88.4 - 84.4 %   Platelet Count 254 150 - 400 K/uL   nRBC 0.0 0.0 - 0.2 %   Neutrophils Relative % 62 %   Neutro Abs 4.3 1.7 - 7.7 K/uL   Lymphocytes Relative 23 %   Lymphs Abs 1.6 0.7 - 4.0 K/uL   Monocytes Relative 7 %   Monocytes Absolute 0.5 0.1 - 1.0 K/uL   Eosinophils Relative 7 %   Eosinophils Absolute 0.5 0.0 - 0.5 K/uL   Basophils Relative 1 %   Basophils Absolute 0.1 0.0 - 0.1 K/uL   Immature Granulocytes 0 %   Abs Immature Granulocytes 0.03 0.00 - 0.07 K/uL    Comment: Performed at [facility], [address], [place], [place] [address]     RADIOGRAPHIC STUDIES:  I have personally reviewed the radiological images as listed and agree with the findings in the report.  CT Angio Chest Pulmonary Embolism (PE) W or WO Contrast Result Date: 01/11/2024 CLINICAL DATA:  Short of breath.  Prior pulmonary embolism. EXAM: CT ANGIOGRAPHY CHEST WITH CONTRAST TECHNIQUE: Multidetector CT imaging of the chest was performed using the standard protocol during bolus administration of intravenous contrast. Multiplanar CT image reconstructions and MIPs were obtained to evaluate the vascular anatomy. RADIATION DOSE REDUCTION: This exam was performed according to the departmental dose-optimization program which includes automated exposure control, adjustment of the mA and/or kV according to patient size and/or use of iterative reconstruction technique. CONTRAST:  70mL ISOVUE -370 IOPAMIDOL  (ISOVUE -370) INJECTION 76% COMPARISON:  None Available. FINDINGS: Cardiovascular: No filling defects within the pulmonary arteries to suggest acute pulmonary embolism. Mediastinum/Nodes: No axillary or supraclavicular adenopathy. No mediastinal or hilar adenopathy. No pericardial fluid. Esophagus normal. Lungs/Pleura: No pulmonary infarction. No pneumonia. No pleural  fluid. No pneumothorax Upper Abdomen: Limited view of the liver, kidneys, pancreas are unremarkable. Normal adrenal glands. Musculoskeletal: No aggressive osseous lesion. Review of the MIP images confirms the above findings. IMPRESSION: 1. No evidence acute pulmonary embolism. 2. No acute pulmonary parenchymal findings. Electronically Signed   By: Jackquline Boxer M.D.   On: 01/11/2024 11:05    Orders Placed This Encounter  Procedures   CBC with Differential (Cancer Center Only)    Standing Status:   Future    Expected Date:   03/28/2024    Expiration Date:   06/26/2024   CMP (Cancer Center only)    Standing Status:   Future    Expected Date:   03/28/2024    Expiration Date:   06/26/2024   Iron and Iron Binding Capacity ([facility]  only)    Standing Status:   Future    Expected Date:   03/28/2024    Expiration Date:   06/26/2024   Ferritin    Standing Status:   Future    Expected Date:   03/28/2024    Expiration Date:   06/26/2024   D-dimer, quantitative    Standing Status:   Future    Expected Date:   03/28/2024    Expiration Date:   06/26/2024     Future Appointments  Date Time Provider Department Center  01/23/2024  9:00 AM CHINF-CHAIR 2 CH-INFWM None  01/23/2024 11:15 AM HVC-ECHO 3 HVC-ECHO H&V  01/24/2024  9:00 AM GI-315 US  1 GI-315US1 GI-315 W. WE  03/28/2024  8:45 AM CHCC-MED-ONC LAB CH[facility]-MEDONC None  03/28/2024  9:15 AM Mayte Diers, Chinita, MD CHCC-MEDONC None  03/31/2024  9:40 AM Croitoru, Jerel, MD CVD-MAGST H&V  04/14/2024 11:00 AM Lomax, Amy, NP GNA-GNA None  04/18/2024 10:00 AM HVC-VASC 10 HVC-ULTRA H&V  04/18/2024 11:00 AM HVC-VASC 10 HVC-ULTRA H&V  04/18/2024 11:40 AM Pearline Norman RAMAN, MD VVS-HVCVS H&V    This document was completed utilizing speech recognition software. Grammatical errors, random word insertions, pronoun errors, and incomplete sentences are an occasional consequence of this system due to software limitations, ambient noise, and hardware issues. Any formal  questions or concerns about the content, text or information contained within the body of this dictation should be directly addressed to the provider for clarification.

## 2024-01-21 DIAGNOSIS — D631 Anemia in chronic kidney disease: Secondary | ICD-10-CM | POA: Diagnosis not present

## 2024-01-21 DIAGNOSIS — N1832 Chronic kidney disease, stage 3b: Secondary | ICD-10-CM | POA: Diagnosis not present

## 2024-01-21 DIAGNOSIS — N02B9 Other recurrent and persistent immunoglobulin A nephropathy: Secondary | ICD-10-CM | POA: Diagnosis not present

## 2024-01-21 DIAGNOSIS — K760 Fatty (change of) liver, not elsewhere classified: Secondary | ICD-10-CM | POA: Diagnosis not present

## 2024-01-21 DIAGNOSIS — J9611 Chronic respiratory failure with hypoxia: Secondary | ICD-10-CM | POA: Diagnosis not present

## 2024-01-21 DIAGNOSIS — I739 Peripheral vascular disease, unspecified: Secondary | ICD-10-CM | POA: Diagnosis not present

## 2024-01-21 DIAGNOSIS — I509 Heart failure, unspecified: Secondary | ICD-10-CM | POA: Diagnosis not present

## 2024-01-21 DIAGNOSIS — N390 Urinary tract infection, site not specified: Secondary | ICD-10-CM | POA: Diagnosis not present

## 2024-01-22 ENCOUNTER — Encounter: Payer: Self-pay | Admitting: Pulmonary Disease

## 2024-01-22 MED ORDER — PREDNISONE 20 MG PO TABS: ORAL_TABLET | ORAL | 0 refills | Status: AC

## 2024-01-22 NOTE — Telephone Encounter (Signed)
**Note De-identified  [place] Obfuscation** Please advise 

## 2024-01-22 NOTE — Telephone Encounter (Signed)
 Prednisone taper sent to pharmacy.

## 2024-01-23 ENCOUNTER — Ambulatory Visit (HOSPITAL_COMMUNITY)
Admission: RE | Admit: 2024-01-23 | Discharge: 2024-01-23 | Disposition: A | Discharge status: Home / Self Care | Source: Ambulatory Visit | Attending: Cardiovascular Disease | Admitting: Cardiovascular Disease

## 2024-01-23 ENCOUNTER — Ambulatory Visit (INDEPENDENT_AMBULATORY_CARE_PROVIDER_SITE_OTHER)

## 2024-01-23 VITALS — BP 107/70 | HR 77 | Temp 97.9°F | Resp 14 | Ht 68.0 in | Wt 205.2 lb

## 2024-01-23 DIAGNOSIS — R0602 Shortness of breath: Secondary | ICD-10-CM | POA: Insufficient documentation

## 2024-01-23 DIAGNOSIS — D509 Iron deficiency anemia, unspecified: Secondary | ICD-10-CM

## 2024-01-23 LAB — ECHOCARDIOGRAM COMPLETE
Area-P 1/2: 4.89 cm2
Calc EF: 43.9 %
Height: 68 in
S' Lateral: 3.9 cm
Single Plane A2C EF: 62.7 %
Single Plane A4C EF: 21.1 %
Weight: 3283.2 [oz_av]

## 2024-01-23 MED ORDER — DIPHENHYDRAMINE HCL 25 MG PO CAPS: 25.0000 mg | ORAL_CAPSULE | Freq: Once | ORAL | Status: DC

## 2024-01-23 MED ORDER — ACETAMINOPHEN 325 MG PO TABS: 650.0000 mg | ORAL_TABLET | Freq: Once | ORAL | Status: DC

## 2024-01-23 MED ORDER — SODIUM CHLORIDE 0.9 % IV SOLN
510.0000 mg | Freq: Once | INTRAVENOUS | Status: AC
  Administered 2024-01-23: 510 mg via INTRAVENOUS
  Filled 2024-01-23: qty 17

## 2024-01-23 NOTE — Progress Notes (Signed)
 Diagnosis: Acute Anemia  Provider:  Mannam, Praveen MD  Procedure: IV Infusion  IV Type: Peripheral, IV Location: R Forearm  Feraheme (Ferumoxytol ), Dose: 510 mg  Patient stated that they already took their premedications at home at least 30 minutes prior to their infusion appointment.   Infusion Start Time: 0949  Infusion Stop Time: 1008  Post Infusion IV Care: Observation period completed and Peripheral IV Discontinued. 25 minute observation per patient request.  Discharge: Condition: Stable, Destination: Home . AVS Declined  Performed by:  Rocky FORBES Sar, RN

## 2024-01-23 NOTE — Addendum Note (Signed)
 Addended by: Inna Tisdell E on: 01/23/2024 12:09 PM   Modules accepted: Orders

## 2024-01-24 ENCOUNTER — Ambulatory Visit: Payer: Self-pay | Admitting: Cardiovascular Disease

## 2024-01-24 ENCOUNTER — Ambulatory Visit
Admission: RE | Admit: 2024-01-24 | Discharge: 2024-01-24 | Disposition: A | Discharge status: Home / Self Care | Source: Ambulatory Visit | Attending: Internal Medicine | Admitting: Internal Medicine

## 2024-01-24 DIAGNOSIS — N400 Enlarged prostate without lower urinary tract symptoms: Secondary | ICD-10-CM | POA: Diagnosis not present

## 2024-01-24 DIAGNOSIS — N1832 Chronic kidney disease, stage 3b: Secondary | ICD-10-CM

## 2024-02-02 DIAGNOSIS — G4734 Idiopathic sleep related nonobstructive alveolar hypoventilation: Secondary | ICD-10-CM | POA: Diagnosis not present

## 2024-02-12 ENCOUNTER — Telehealth: Payer: Self-pay | Admitting: Neurology

## 2024-02-12 DIAGNOSIS — I2699 Other pulmonary embolism without acute cor pulmonale: Secondary | ICD-10-CM

## 2024-02-12 DIAGNOSIS — R0602 Shortness of breath: Secondary | ICD-10-CM

## 2024-02-12 DIAGNOSIS — F458 Other somatoform disorders: Secondary | ICD-10-CM

## 2024-02-12 DIAGNOSIS — G4734 Idiopathic sleep related nonobstructive alveolar hypoventilation: Secondary | ICD-10-CM

## 2024-02-12 DIAGNOSIS — G4733 Obstructive sleep apnea (adult) (pediatric): Secondary | ICD-10-CM

## 2024-02-12 DIAGNOSIS — I429 Cardiomyopathy, unspecified: Secondary | ICD-10-CM

## 2024-02-12 NOTE — Telephone Encounter (Signed)
 Patient' wife, Diane Orellana  (on DPR)needs CPAP adjusted, pressure needs to be decreased a little bit. Patient gets a build up of air mouth.

## 2024-02-12 NOTE — Telephone Encounter (Signed)
 SABRA

## 2024-02-12 NOTE — Telephone Encounter (Signed)
 Excellent resolution of apnea and high compliance.  Dedra Gores, MD

## 2024-02-13 DIAGNOSIS — I509 Heart failure, unspecified: Secondary | ICD-10-CM | POA: Diagnosis not present

## 2024-02-13 DIAGNOSIS — R0609 Other forms of dyspnea: Secondary | ICD-10-CM | POA: Diagnosis not present

## 2024-02-13 DIAGNOSIS — F458 Other somatoform disorders: Secondary | ICD-10-CM | POA: Insufficient documentation

## 2024-02-13 NOTE — Telephone Encounter (Signed)
 Changes made in airview and order sent to [facility].

## 2024-02-14 NOTE — Telephone Encounter (Signed)
 New, Donald [name], Otilio Jefferson, RN; Alain Honey; Jeris Penta, [place]; 1 other Received, Thank you!

## 2024-02-19 ENCOUNTER — Other Ambulatory Visit: Payer: Self-pay | Admitting: Neurology

## 2024-02-19 DIAGNOSIS — G4736 Sleep related hypoventilation in conditions classified elsewhere: Secondary | ICD-10-CM

## 2024-02-19 DIAGNOSIS — I2699 Other pulmonary embolism without acute cor pulmonale: Secondary | ICD-10-CM

## 2024-02-19 DIAGNOSIS — I5020 Unspecified systolic (congestive) heart failure: Secondary | ICD-10-CM

## 2024-02-19 DIAGNOSIS — G4733 Obstructive sleep apnea (adult) (pediatric): Secondary | ICD-10-CM

## 2024-02-19 DIAGNOSIS — F458 Other somatoform disorders: Secondary | ICD-10-CM

## 2024-02-19 NOTE — Telephone Encounter (Signed)
 SABRA

## 2024-02-19 NOTE — Progress Notes (Signed)
 I have ordered a repeat HST , due to the reported aerophagia on CPAP at long time used settings. CD

## 2024-02-21 ENCOUNTER — Telehealth: Payer: Self-pay | Admitting: Neurology

## 2024-02-21 NOTE — Telephone Encounter (Signed)
 HST- HTA pending

## 2024-02-26 ENCOUNTER — Telehealth (HOSPITAL_BASED_OUTPATIENT_CLINIC_OR_DEPARTMENT_OTHER): Payer: Self-pay | Admitting: *Deleted

## 2024-02-26 ENCOUNTER — Other Ambulatory Visit: Payer: Self-pay | Admitting: Gastroenterology

## 2024-02-26 DIAGNOSIS — Z86718 Personal history of other venous thrombosis and embolism: Secondary | ICD-10-CM | POA: Diagnosis not present

## 2024-02-26 DIAGNOSIS — K317 Polyp of stomach and duodenum: Secondary | ICD-10-CM | POA: Diagnosis not present

## 2024-02-26 DIAGNOSIS — Z7902 Long term (current) use of antithrombotics/antiplatelets: Secondary | ICD-10-CM | POA: Diagnosis not present

## 2024-02-26 DIAGNOSIS — D509 Iron deficiency anemia, unspecified: Secondary | ICD-10-CM | POA: Diagnosis not present

## 2024-02-26 DIAGNOSIS — J9611 Chronic respiratory failure with hypoxia: Secondary | ICD-10-CM | POA: Diagnosis not present

## 2024-02-26 DIAGNOSIS — I5020 Unspecified systolic (congestive) heart failure: Secondary | ICD-10-CM | POA: Diagnosis not present

## 2024-02-26 DIAGNOSIS — Z86018 Personal history of other benign neoplasm: Secondary | ICD-10-CM | POA: Diagnosis not present

## 2024-02-26 NOTE — Telephone Encounter (Signed)
   Pre-operative Risk Assessment    Patient Name: Donald [name]  DOB: 1950/09/03 MRN: [id number]   Date of last office visit: 08/03/23 DR. CROITORU Date of next office visit: 03/31/24 DR. CROITORU   Request for Surgical Clearance    Procedure:  COLONOSCOPY  Date of Surgery:  Clearance 04/01/24                                Surgeon:  DR. ROSALIE Surgeon's Group or Practice Name:  [facility] Phone number:  [phone] Fax number:  [phone]   Type of Clearance Requested:   - Medical ; NONE INDICATED ON FORM TO BE HELD   Type of Anesthesia:  PROPOFOL    Additional requests/questions:    [name]   02/26/2024, 3:35 PM

## 2024-02-27 ENCOUNTER — Encounter: Attending: Internal Medicine | Admitting: Skilled Nursing Facility1

## 2024-02-27 ENCOUNTER — Encounter: Payer: Self-pay | Admitting: Skilled Nursing Facility1

## 2024-02-27 VITALS — Ht 68.0 in | Wt 195.9 lb

## 2024-02-27 DIAGNOSIS — Z794 Long term (current) use of insulin: Secondary | ICD-10-CM | POA: Insufficient documentation

## 2024-02-27 DIAGNOSIS — E119 Type 2 diabetes mellitus without complications: Secondary | ICD-10-CM | POA: Insufficient documentation

## 2024-02-27 NOTE — Progress Notes (Signed)
 A1C 8.1  Other DX: CKD stage 3 CHF HTN  DM medications: Ozempic  Insulin  but no longer using   GFR 37.4   potassium 4.0  Pts wife states the pt has some mild cognitive issues so she speaks for him and his fatige keeps him tired. Pts wife states she does the cooking. Pt states he does not get hungry very often. Pt states he does not use any insulin  anymore.  Pt states he is struggling with Anemia and fatigue currently.  Pt was able to articulate his needs and preformed teach back without issue.   ZERO low events in 30 days CGM Results from download:   % Time CGM active:    %   (Goal >70%)  Average glucose:   148 mg/dL for 14 days  Glucose management indicator:   6.9 %  Time in range (70-180 mg/dL):   84 %   (Goal >29%)  Time High (181-250 mg/dL):   15 %   (Goal < 74%)  Time Very High (>250 mg/dL):     %   (Goal < 5%)  Time Low (54-69 mg/dL):    %   (Goal <5%)  Time Very Low (<54 mg/dL):    %   (Goal <8%)  %CV (glucose variability)     %  (Goal <36%)     Diabetes Self-Management Education  Visit Type: First/Initial  Appt. Start Time: 10:02 Appt. End Time: 11:10  02/27/2024  Mr. Donald [name], identified by name and date of birth, is a 73 y.o. male with a diagnosis of Diabetes: Type 2.   ASSESSMENT  Height 5' 8 (1.727 m), weight 195 lb 14.4 oz (88.9 kg). Body mass index is 29.79 kg/m.   Diabetes Self-Management Education - 02/25/24 1959       Visit Information   Visit Type First/Initial      Initial Visit   Diabetes Type Type 2      Psychosocial Assessment   Patient Belief/Attitude about Diabetes Motivated to manage diabetes    What is the hardest part about your diabetes right now, causing you the most concern, or is the most worrisome to you about your diabetes?   Making healty food and beverage choices    Self-care barriers Debilitated state due to current medical condition    Self-management support Family    Other persons present Spouse/SO     Patient Concerns Nutrition/Meal planning;Healthy Lifestyle    Special Needs None    Preferred Learning Style Visual;Auditory    Learning Readiness Change in progress    How often do you need to have someone help you when you read instructions, pamphlets, or other written materials from your doctor or pharmacy? 1 - Never      Pre-Education Assessment   Patient understands the diabetes disease and treatment process. Needs Instruction    Patient understands incorporating nutritional management into lifestyle. Needs Instruction    Patient undertands incorporating physical activity into lifestyle. Needs Instruction    Patient understands using medications safely. Needs Instruction    Patient understands monitoring blood glucose, interpreting and using results Needs Instruction    Patient understands prevention, detection, and treatment of acute complications. Needs Instruction    Patient understands prevention, detection, and treatment of chronic complications. Needs Instruction    Patient understands how to develop strategies to address psychosocial issues. Needs Instruction    Patient understands how to develop strategies to promote health/change behavior. Needs Instruction      Complications   Last  HgB A1C per patient/outside source 8.1 %    How often do you check your blood sugar? > 4 times/day    Fasting Blood glucose range (mg/dL) 29-870    Postprandial Blood glucose range (mg/dL) 29-870;869-820    Number of hypoglycemic episodes per month 0    Are you checking your feet? Yes    How many days per week are you checking your feet? 7      Activity / Exercise   Activity / Exercise Type ADL's    How many minutes per day do you exercise? 0    Total minutes per week of exercise 0      Patient Education   Disease Pathophysiology Factors that contribute to the development of diabetes    Healthy Eating Role of diet in the treatment of diabetes and the relationship between the three main  macronutrients and blood glucose level;Food label reading, portion sizes and measuring food.;Plate Method;Reviewed blood glucose goals for pre and post meals and how to evaluate the patients' food intake on their blood glucose level.;Meal timing in regards to the patients' current diabetes medication.;Information on hints to eating out and maintain blood glucose control.;Meal options for control of blood glucose level and chronic complications.    Being Active Role of exercise on diabetes management, blood pressure control and cardiac health.    Medications Taught/reviewed insulin /injectables, injection, site rotation, insulin /injectables storage and needle disposal.;Reviewed patients medication for diabetes, action, purpose, timing of dose and side effects.    Acute complications Taught prevention, symptoms, and  treatment of hypoglycemia - the 15 rule.    Chronic complications Dental care;Retinopathy and reason for yearly dilated eye exams;Nephropathy, what it is, prevention of, the use of ACE, ARB's and early detection of through urine microalbumia.;Relationship between chronic complications and blood glucose control    Diabetes Stress and Support Identified and addressed patients feelings and concerns about diabetes;Role of stress on diabetes      Individualized Goals (developed by patient)   Nutrition Follow meal plan discussed;General guidelines for healthy choices and portions discussed    Physical Activity Exercise 1-2 times per week;15 minutes per day    Monitoring  Test my blood glucose as discussed;Test blood glucose pre and post meals as discussed      Post-Education Assessment   Patient understands the diabetes disease and treatment process. Demonstrates understanding / competency    Patient understands incorporating nutritional management into lifestyle. Demonstrates understanding / competency    Patient undertands incorporating physical activity into lifestyle. Demonstrates  understanding / competency    Patient understands using medications safely. Demonstrates understanding / competency    Patient understands monitoring blood glucose, interpreting and using results Demonstrates understanding / competency    Patient understands prevention, detection, and treatment of acute complications. Demonstrates understanding / competency    Patient understands prevention, detection, and treatment of chronic complications. Demonstrates understanding / competency    Patient understands how to develop strategies to address psychosocial issues. Demonstrates understanding / competency    Patient understands how to develop strategies to promote health/change behavior. Demonstrates understanding / competency      Outcomes   Expected Outcomes Demonstrated interest in learning. Expect positive outcomes    Future DMSE 2 months    Program Status Completed          Individualized Plan for Diabetes Self-Management Training:   Learning Objective:  Patient will have a greater understanding of diabetes self-management. Patient education plan is to attend individual and/or group sessions per  assessed needs and concerns.    Expected Outcomes:  Demonstrated interest in learning. Expect positive outcomes  Education material provided: ADA - How to Thrive: A Guide for Your Journey with Diabetes, Meal plan card, My Plate, and Snack sheet  If problems or questions, patient to contact team via:  Phone and Email  Future DSME appointment: 2 months

## 2024-03-03 DIAGNOSIS — G4734 Idiopathic sleep related nonobstructive alveolar hypoventilation: Secondary | ICD-10-CM | POA: Diagnosis not present

## 2024-03-03 NOTE — Telephone Encounter (Signed)
 HST HTA shara: [id number] (exp. 02/21/24 to 114/26)

## 2024-03-03 NOTE — Telephone Encounter (Signed)
   Name: Donald [name]  DOB: 09-14-1950  MRN: [id number]  Primary Cardiologist: Jerel Balding, MD  Chart reviewed as part of pre-operative protocol coverage. The patient has an upcoming visit scheduled with Dr. Balding on 03/31/2024 at which time clearance can be addressed in case there are any issues that would impact surgical recommendations.  Colonoscopy is not scheduled until 04/01/2024 as below. I added preop FYI to appointment note so that provider is aware to address at time of outpatient visit.  Per office protocol the cardiology provider should forward their finalized clearance decision and recommendations regarding antiplatelet therapy to the requesting party below.    Per office protocol, patient can hold Eliquis  for 2 days prior to procedure.     Patient will not need bridging with Lovenox  (enoxaparin ) around procedure.  I will route this message as FYI to requesting party and remove this message from the preop box as separate preop APP input not needed at this time.   Please call with any questions.  Lum LITTIE Louis, NP  03/03/2024, 1:16 PM

## 2024-03-03 NOTE — Telephone Encounter (Signed)
 Patient with diagnosis of PE on Eliquis  for anticoagulation.     Procedure:  COLONOSCOPY   Date of Surgery:  Clearance 04/01/24   CHA2DS2-VASc Score = N/A (patient does not have Afib)   CrCl 32 ml/min Platelet count 254 K  Patient has not had an Afib/aflutter ablation within the last 3 months or DCCV within the last 30 days  Per office protocol, patient can hold Eliquis  for 2 days prior to procedure.    Patient will not need bridging with Lovenox  (enoxaparin ) around procedure.  **This guidance is not considered finalized until pre-operative APP has relayed final recommendations.**

## 2024-03-04 ENCOUNTER — Encounter: Payer: Self-pay | Admitting: Oncology

## 2024-03-05 ENCOUNTER — Encounter: Payer: Self-pay | Admitting: Pulmonary Disease

## 2024-03-05 ENCOUNTER — Other Ambulatory Visit: Payer: Self-pay | Admitting: Neurology

## 2024-03-05 DIAGNOSIS — G4733 Obstructive sleep apnea (adult) (pediatric): Secondary | ICD-10-CM

## 2024-03-05 DIAGNOSIS — R0602 Shortness of breath: Secondary | ICD-10-CM

## 2024-03-05 DIAGNOSIS — G3184 Mild cognitive impairment, so stated: Secondary | ICD-10-CM

## 2024-03-05 DIAGNOSIS — I2699 Other pulmonary embolism without acute cor pulmonale: Secondary | ICD-10-CM

## 2024-03-05 DIAGNOSIS — G4736 Sleep related hypoventilation in conditions classified elsewhere: Secondary | ICD-10-CM

## 2024-03-05 DIAGNOSIS — F458 Other somatoform disorders: Secondary | ICD-10-CM

## 2024-03-05 DIAGNOSIS — I5020 Unspecified systolic (congestive) heart failure: Secondary | ICD-10-CM

## 2024-03-06 ENCOUNTER — Telehealth: Payer: Self-pay | Admitting: Neurology

## 2024-03-06 NOTE — Telephone Encounter (Signed)
 Patient is requesting an in lab sleep study.  HTA split pending.

## 2024-03-13 DIAGNOSIS — I2510 Atherosclerotic heart disease of native coronary artery without angina pectoris: Secondary | ICD-10-CM | POA: Diagnosis not present

## 2024-03-13 DIAGNOSIS — Z794 Long term (current) use of insulin: Secondary | ICD-10-CM | POA: Diagnosis not present

## 2024-03-13 DIAGNOSIS — E118 Type 2 diabetes mellitus with unspecified complications: Secondary | ICD-10-CM | POA: Diagnosis not present

## 2024-03-13 DIAGNOSIS — I130 Hypertensive heart and chronic kidney disease with heart failure and stage 1 through stage 4 chronic kidney disease, or unspecified chronic kidney disease: Secondary | ICD-10-CM | POA: Diagnosis not present

## 2024-03-13 DIAGNOSIS — N1832 Chronic kidney disease, stage 3b: Secondary | ICD-10-CM | POA: Diagnosis not present

## 2024-03-18 ENCOUNTER — Telehealth: Payer: Self-pay

## 2024-03-18 NOTE — Telephone Encounter (Signed)
 Paperwork faxed to [employer]

## 2024-03-20 NOTE — Telephone Encounter (Signed)
 Split HTA auth: [id number] (exp. 03/06/24 to 06/04/24)   [name]

## 2024-03-26 ENCOUNTER — Encounter (HOSPITAL_COMMUNITY): Payer: Self-pay | Admitting: Gastroenterology

## 2024-03-28 ENCOUNTER — Encounter: Payer: Self-pay | Admitting: Oncology

## 2024-03-28 ENCOUNTER — Inpatient Hospital Stay: Attending: Oncology

## 2024-03-28 ENCOUNTER — Inpatient Hospital Stay: Admitting: Oncology

## 2024-03-28 VITALS — BP 97/67 | HR 92 | Temp 98.2°F | Resp 15 | Ht 68.0 in | Wt 196.0 lb

## 2024-03-28 DIAGNOSIS — R5383 Other fatigue: Secondary | ICD-10-CM | POA: Diagnosis not present

## 2024-03-28 DIAGNOSIS — I2699 Other pulmonary embolism without acute cor pulmonale: Secondary | ICD-10-CM

## 2024-03-28 DIAGNOSIS — Z79899 Other long term (current) drug therapy: Secondary | ICD-10-CM | POA: Insufficient documentation

## 2024-03-28 DIAGNOSIS — Z7984 Long term (current) use of oral hypoglycemic drugs: Secondary | ICD-10-CM | POA: Insufficient documentation

## 2024-03-28 DIAGNOSIS — N1832 Chronic kidney disease, stage 3b: Secondary | ICD-10-CM | POA: Insufficient documentation

## 2024-03-28 DIAGNOSIS — D509 Iron deficiency anemia, unspecified: Secondary | ICD-10-CM

## 2024-03-28 DIAGNOSIS — Z7902 Long term (current) use of antithrombotics/antiplatelets: Secondary | ICD-10-CM | POA: Insufficient documentation

## 2024-03-28 DIAGNOSIS — Z794 Long term (current) use of insulin: Secondary | ICD-10-CM | POA: Insufficient documentation

## 2024-03-28 DIAGNOSIS — Z86718 Personal history of other venous thrombosis and embolism: Secondary | ICD-10-CM | POA: Diagnosis not present

## 2024-03-28 DIAGNOSIS — Z86711 Personal history of pulmonary embolism: Secondary | ICD-10-CM | POA: Diagnosis not present

## 2024-03-28 DIAGNOSIS — Z7901 Long term (current) use of anticoagulants: Secondary | ICD-10-CM | POA: Insufficient documentation

## 2024-03-28 DIAGNOSIS — Z860100 Personal history of colon polyps, unspecified: Secondary | ICD-10-CM | POA: Diagnosis not present

## 2024-03-28 DIAGNOSIS — I130 Hypertensive heart and chronic kidney disease with heart failure and stage 1 through stage 4 chronic kidney disease, or unspecified chronic kidney disease: Secondary | ICD-10-CM | POA: Diagnosis not present

## 2024-03-28 LAB — CBC WITH DIFFERENTIAL (CANCER CENTER ONLY)
Abs Immature Granulocytes: 0.03 K/uL (ref 0.00–0.07)
Basophils Absolute: 0.1 K/uL (ref 0.0–0.1)
Basophils Relative: 1 %
Eosinophils Absolute: 0.7 K/uL — ABNORMAL HIGH (ref 0.0–0.5)
Eosinophils Relative: 8 %
HCT: 40 % (ref 39.0–52.0)
Hemoglobin: 13.1 g/dL (ref 13.0–17.0)
Immature Granulocytes: 0 %
Lymphocytes Relative: 20 %
Lymphs Abs: 1.6 K/uL (ref 0.7–4.0)
MCH: 27.6 pg (ref 26.0–34.0)
MCHC: 32.8 g/dL (ref 30.0–36.0)
MCV: 84.4 fL (ref 80.0–100.0)
Monocytes Absolute: 0.5 K/uL (ref 0.1–1.0)
Monocytes Relative: 6 %
Neutro Abs: 5.3 K/uL (ref 1.7–7.7)
Neutrophils Relative %: 65 %
Platelet Count: 201 K/uL (ref 150–400)
RBC: 4.74 MIL/uL (ref 4.22–5.81)
RDW: 17.9 % — ABNORMAL HIGH (ref 11.5–15.5)
WBC Count: 8.1 K/uL (ref 4.0–10.5)
nRBC: 0 % (ref 0.0–0.2)

## 2024-03-28 LAB — CMP (CANCER CENTER ONLY)
ALT: 24 U/L (ref 0–44)
AST: 24 U/L (ref 15–41)
Albumin: 4.2 g/dL (ref 3.5–5.0)
Alkaline Phosphatase: 49 U/L (ref 38–126)
Anion gap: 13 (ref 5–15)
BUN: 25 mg/dL — ABNORMAL HIGH (ref 8–23)
CO2: 27 mmol/L (ref 22–32)
Calcium: 9.7 mg/dL (ref 8.9–10.3)
Chloride: 99 mmol/L (ref 98–111)
Creatinine: 1.58 mg/dL — ABNORMAL HIGH (ref 0.61–1.24)
GFR, Estimated: 46 mL/min — ABNORMAL LOW (ref >60)
Glucose, Bld: 329 mg/dL — ABNORMAL HIGH (ref 70–99)
Potassium: 4 mmol/L (ref 3.5–5.1)
Sodium: 138 mmol/L (ref 135–145)
Total Bilirubin: 0.4 mg/dL (ref 0.0–1.2)
Total Protein: 6.7 g/dL (ref 6.5–8.1)

## 2024-03-28 LAB — IRON AND IRON BINDING CAPACITY (CC-WL,HP ONLY)
Iron: 70 ug/dL (ref 45–182)
Saturation Ratios: 21 % (ref 17.9–39.5)
TIBC: 333 ug/dL (ref 250–450)
UIBC: 264 ug/dL

## 2024-03-28 LAB — D-DIMER, QUANTITATIVE: D-Dimer, Quant: 0.6 ug{FEU}/mL — ABNORMAL HIGH (ref 0.00–0.50)

## 2024-03-28 LAB — FERRITIN: Ferritin: 119 ng/mL (ref 24–336)

## 2024-03-28 MED ORDER — CLOPIDOGREL BISULFATE 75 MG PO TABS: 75.0000 mg | ORAL_TABLET | Freq: Every day | ORAL | Status: AC

## 2024-03-28 NOTE — Assessment & Plan Note (Addendum)
 History of iron deficiency anemia with low hemoglobin levels (11 g/dL) and low iron parameters. No obvious signs of blood loss. Recent colonoscopy in January 2022 showed external hemorrhoids, diverticulosis, and a small polyp. Potential contribution from chronic kidney disease. Symptoms included tiredness and worsening energy levels.   He received IV iron infusions with Feraheme x 2 doses.  Labs today showed much improved hemoglobin of 13.1.  Iron studies currently show no evidence of iron deficiency.  - Coordinate with primary care for follow-up on kidney function and potential impact on anemia

## 2024-03-28 NOTE — Assessment & Plan Note (Addendum)
 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   Significant symptom improvement reported since starting anticoagulation, resumed gym activities.   Current treatment involves anticoagulation therapy with Eliquis  5 mg twice daily.    On his initial consultation with us  on 08/08/2023, we obtained thrombophilia workup with factor V Leiden mutation, prothrombin gene mutation, beta-2  glycoprotein antibodies, anticardiolipin antibodies, lupus anticoagulant testing and it was all unremarkable.  D-dimer was normal at 0.47.  Rest of the hypercoagulable workup was obtained on 01/04/2024-including protein C activity, protein S activity, Antithrombin III  activity and they were all within normal limits.  Repeat CT angiogram of the chest on 01/11/2024 showed no evidence of pulmonary embolism.  Since he completed at least 49-month course of anticoagulation and given no evidence of pulmonary embolism on CT angiogram of the chest, it is safe for him to discontinue anticoagulation at this time since thrombophilia workup was negative and given the fact that he has ongoing iron deficiency anemia.  He discontinued Eliquis  in September 2025 and started taking aspirin  81 mg daily for clot prevention.  D-dimer is 0.6 today, mildly elevated but not concerning.  Clinically no symptoms suggestive of recurrence of thromboembolism.  Will continue to monitor this for now.  He understands that in case of recurrence of DVT or PE, he needs to remain on anticoagulation for life.

## 2024-03-28 NOTE — Progress Notes (Signed)
 [facility]  HEMATOLOGY CLINIC PROGRESS NOTE  PATIENT NAME: Donald [name]   MR#: [id number] DOB: 1950-09-02  Patient Care Team: Janey Santos, MD as PCP - General (Internal Medicine) Croitoru, Jerel, MD as PCP - Cardiology (Cardiology) Verta Izetta HERO, NP as Nurse Practitioner (Internal Medicine)  Date of visit: 03/28/2024   ASSESSMENT & PLAN:   Donald [name] is a 73 y.o.  gentleman with a past medical history of hypertension, diabetes mellitus, dyslipidemia, IgA nephropathy, OSA on CPAP, CHF, CKD Stage 3b, was referred to our service for evaluation of possible hypercoagulable state, given bilateral pulmonary emboli and DVT of the left lower extremity, diagnosed on 07/03/2023.    Hx of Bilateral pulmonary embolism (HCC) Worsening dyspnea led to diagnosis of bilateral pulmonary emboli and left leg DVT on 07/02/2023.   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   Significant symptom improvement reported since starting anticoagulation, resumed gym activities.   Current treatment involves anticoagulation therapy with Eliquis  5 mg twice daily.    On his initial consultation with us  on 08/08/2023, we obtained thrombophilia workup with factor V Leiden mutation, prothrombin gene mutation, beta-2  glycoprotein antibodies, anticardiolipin antibodies, lupus anticoagulant testing and it was all unremarkable.  D-dimer was normal at 0.47.  Rest of the hypercoagulable workup was obtained on 01/04/2024-including protein C activity, protein S activity, Antithrombin III  activity and they were all within normal limits.  Repeat CT angiogram of the chest on 01/11/2024 showed no evidence of pulmonary embolism.  Since he completed at least 66-month course of anticoagulation and given no evidence of pulmonary embolism on CT angiogram of the  chest, it is safe for him to discontinue anticoagulation at this time since thrombophilia workup was negative and given the fact that he has ongoing iron deficiency anemia.  He discontinued Eliquis  in September 2025 and started taking aspirin  81 mg daily for clot prevention.  D-dimer is 0.6 today, mildly elevated but not concerning.  Clinically no symptoms suggestive of recurrence of thromboembolism.  Will continue to monitor this for now.  He understands that in case of recurrence of DVT or PE, he needs to remain on anticoagulation for life.  Microcytic anemia History of iron deficiency anemia with low hemoglobin levels (11 g/dL) and low iron parameters. No obvious signs of blood loss. Recent colonoscopy in January 2022 showed external hemorrhoids, diverticulosis, and a small polyp. Potential contribution from chronic kidney disease. Symptoms included tiredness and worsening energy levels.   He received IV iron infusions with Feraheme x 2 doses.  Labs today showed much improved hemoglobin of 13.1.  Iron studies currently show no evidence of iron deficiency.  - Coordinate with primary care for follow-up on kidney function and potential impact on anemia  Fatigue Persists despite improvement in hemoglobin levels. He reports significant fatigue after exercise, which may be related to previous anemia and current heart condition. Energy levels are expected to improve over time. - Encouraged gradual increase in physical activity as tolerated.  I spent a total of 30 minutes during this encounter with the patient including review of chart and various tests results, discussions about plan of care and coordination of care plan.  I reviewed lab results and outside records for this visit and discussed relevant results with the patient. Diagnosis, plan of care and treatment options were also discussed in detail with the patient. Opportunity provided to ask questions and  answers provided to his apparent  satisfaction. Provided instructions to call our clinic with any problems, questions or concerns prior to return visit. I recommended to continue follow-up with PCP and sub-specialists. He verbalized understanding and agreed with the plan. No barriers to learning was detected.  Chinita Patten, MD  03/28/2024 5:46 PM  [facility] - [facility] DE[facility] [address] [place] [address] Dept: [phone] Dept Fax: [phone]   CHIEF COMPLAINT/ REASON FOR VISIT:  Follow-up for history of bilateral pulm embolism.  Grossly negative thrombophilia workup.  INTERVAL HISTORY:  Discussed the use of AI scribe software for clinical note transcription with the patient, who gave verbal consent to proceed.  History of Present Illness Donald [name] is a 73 year old male with anemia and congestive heart failure who presents for follow-up after IV iron treatment.  He has received two doses of IV iron Gearl) for anemia, resulting in an improvement in hemoglobin levels from 11 to 13.1, which is at the lower limit of normal. Despite this improvement, he continues to experience persistent fatigue and decreased energy levels compared to his baseline. This fatigue significantly impacts his daily activities, as he can walk farther now but still not as much as before, and it takes him a while to recuperate after exertion.  He has a history of congestive heart failure and is under the care of a cardiologist. He has recently returned to the gym, attending four times last week, but experiences significant fatigue after workouts, stating 'I am so literally tired' and 'all I can do is lay down' after exercising. He is not working out as intensely as before due to his low energy levels.  He is currently taking Plavix  once a day following stent placement and has discontinued Eliquis , which he was on for blood clots earlier this year. He is  also taking aspirin  81 mg daily.   SUMMARY OF HEMATOLOGIC HISTORY:  He presented to the ED on 07/02/2023 with worsening shortness of breath.  He was found to have bilateral pulmonary emboli, right greater than left on CT angiogram of the chest.  There was no evidence of right heart strain.  Ultrasound Doppler showed DVT in the left lower extremity in the posterior tibial and left peroneal veins.  PE/DVT was felt likely to be provoked by sedentary lifestyle.  He was started on Eliquis  twice daily with plan to continue at least for 6 months.  He was referred to us  for further evaluation and for recommendations regarding duration of anticoagulation.   He is accompanied by Diane, his wife.   He was not on bed rest prior to the diagnosis of DVT/PE but had reduced activity following back surgery in the spring of the previous year. His breathing difficulties began around Thanksgiving, and he was unable to exercise due to shortness of breath. He has been using oxygen  at night and a CPAP machine due to low oxygen  levels during sleep. Since starting blood thinners, his symptoms have improved significantly, and he has returned to the gym. No current leg pain, swelling, or redness. No blood in stools or black stools.   He has a history of IgA nephropathy, which was diagnosed after blood was found in his urine. He has not seen a nephrologist recently but has been monitored by his primary care physician. He has not received treatment for nephropathy, and his kidney function numbers have remained stable.  His social history includes being retired, spending time at home, and engaging in activities such as reading and using the computer. He attempts to work out three times a week and sees a systems analyst twice a week. He has difficulty walking due to foot pain.   He denies fever, cough, diarrhea, or other infectious symptoms.  He denies epistaxis, bloody stool, melena, hematuria, bruising or other bleeding  symptoms. He also denies unintentional weight loss, or other constitutional symptoms.   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    Significant symptom improvement reported since starting anticoagulation, resumed gym activities.    Current treatment involves anticoagulation therapy with Eliquis  5 mg twice daily.    On his initial consultation with us  on 08/08/2023, we obtained thrombophilia workup with factor V Leiden mutation, prothrombin gene mutation, beta-2  glycoprotein antibodies, anticardiolipin antibodies, lupus anticoagulant testing and it was all unremarkable.  D-dimer was normal at 0.47.  Rest of the hypercoagulable workup was deferred as it can be falsely abnormal given recent thromboembolism.  This was obtained on 01/04/2024.    On 01/04/2024, request submitted for CT angiogram of the chest to follow-up on pulmonary embolism status.    Repeat CT angiogram of the chest on 01/11/2024 showed no evidence of pulmonary embolism.   Since he completed at least 3-month course of anticoagulation and given no evidence of pulmonary embolism on CT angiogram of the chest, it is safe for him to discontinue anticoagulation at this time since thrombophilia workup was negative and given the fact that he has ongoing iron deficiency anemia.   He was advised to follow-up with his cardiologist regarding consideration of aspirin  81 mg daily for clot prevention.   He understands that in case of recurrence of DVT or PE, he needs to remain on anticoagulation for life.  I have reviewed the past medical history, past surgical history, social history and family history with the patient and they are unchanged from previous note.  ALLERGIES: He is allergic to ciprofloxacin, jardiance [empagliflozin], levaquin [levofloxacin], aricept  [donepezil  hcl], bactrim  [sulfamethoxazole-trimethoprim], breztri  aerosphere [budeson-glycopyrrol-formoterol ], and misc. sulfonamide containing compounds.  MEDICATIONS:  Current Outpatient Medications  Medication Sig Dispense Refill   aspirin  EC 81 MG tablet Take 81 mg by mouth daily. Swallow whole.     finasteride  (PROSCAR ) 5 MG tablet Take 5 mg by mouth in the morning.     fluticasone  (FLONASE ) 50 MCG/ACT nasal spray Place 1 spray into both nostrils daily as needed for allergies.     furosemide  (LASIX ) 40 MG tablet Take 40 mg by mouth daily.     insulin  lispro (HUMALOG  KWIKPEN) 200 UNIT/ML KwikPen Inject 30-130 Units into the skin in the morning, at noon, and at bedtime. Sliding scale If blood sugar is over 140     memantine  (NAMENDA ) 10 MG tablet Take 1 tablet (10 mg total) by mouth 2 (two) times daily. 180 tablet 3   metFORMIN  (GLUCOPHAGE ) 1000 MG tablet Take 1 tablet (1,000 mg total) by mouth 2 (two) times daily with a meal. Restart on 8/22     metoprolol  succinate (TOPROL -XL) 50 MG 24 hr tablet Take 50 mg by mouth 2 (two) times daily. Take with or immediately following a meal.     pantoprazole  (PROTONIX ) 40 MG tablet Take 1 tablet (40 mg total) by mouth 2 (two) times daily. 180 tablet 0   sacubitril -valsartan  (ENTRESTO ) 97-103 MG TAKE ONE TABLET  BY MOUTH TWICE DAILY 180 tablet 3   Semaglutide,0.25 or 0.5MG /DOS, (OZEMPIC, 0.25 OR 0.5 MG/DOSE,) 2 MG/3ML SOPN as directed Subcutaneous     silodosin (RAPAFLO) 8 MG CAPS capsule Take 8 mg by mouth every evening.     spironolactone  (ALDACTONE ) 25 MG tablet Take one-half tablet by mouth daily 45 tablet 3   TRESIBA FLEXTOUCH 200 UNIT/ML FlexTouch Pen Inject 100 Units into the skin in the morning.     cetirizine (ZYRTEC) 10 MG tablet Take 10 mg by mouth every evening. (Patient not taking: Reported on 03/28/2024)     clopidogrel  (PLAVIX ) 75 MG tablet Take 1 tablet (75 mg total) by mouth daily.     Continuous Blood Gluc Sensor (FREESTYLE LIBRE 14 DAY SENSOR) MISC Inject 1  Application into the skin every 14 (fourteen) days.     rosuvastatin  (CRESTOR ) 20 MG tablet TAKE ONE TABLET BY MOUTH ONE TIME DAILY (Patient taking differently: Take 20 mg by mouth every evening.) 90 tablet 3   No current facility-administered medications for this visit.     REVIEW OF SYSTEMS:    Review of Systems - Oncology  All other pertinent systems were reviewed with the patient and are negative.  PHYSICAL EXAMINATION:    Onc Performance Status - 03/28/24 0900       ECOG Perf Status   ECOG Perf Status Restricted in physically strenuous activity but ambulatory and able to carry out work of a light or sedentary nature, e.g., light house work, office work      KPS SCALE   KPS % SCORE Able to carry on normal activity, minor s/s of disease          Vitals:   03/28/24 0906  BP: 97/67  Pulse: 92  Resp: 15  Temp: 98.2 F (36.8 C)  SpO2: 96%   Filed Weights   03/28/24 0906  Weight: 196 lb (88.9 kg)    Physical Exam Constitutional:      General: He is not in acute distress.    Appearance: Normal appearance.  HENT:     Head: Normocephalic and atraumatic.  Eyes:     Conjunctiva/sclera: Conjunctivae normal.  Cardiovascular:     Rate and Rhythm: Normal rate and regular rhythm.     Heart sounds: Normal heart sounds.  Pulmonary:     Effort: Pulmonary effort is normal. No respiratory distress.     Comments: Wearing O2 via [place] Abdominal:     General: There is no distension.  Neurological:     General: No focal deficit present.     Mental Status: He is alert and oriented to person, place, and time.  Psychiatric:        Mood and Affect: Mood normal.        Behavior: Behavior normal.     LABORATORY DATA:   I have reviewed the data as listed.  Results for orders placed or performed in visit on 03/28/24  CBC with Differential (Cancer Center Only)  Result Value Ref Range   WBC Count 8.1 4.0 - 10.5 K/uL   RBC 4.74 4.22 - 5.81 MIL/uL   Hemoglobin 13.1 13.0 - 17.0  g/dL   HCT 59.9 60.9 - 47.9 %   MCV 84.4 80.0 - 100.0 fL   MCH 27.6 26.0 - 34.0 pg   MCHC 32.8 30.0 - 36.0 g/dL   RDW 82.0 (H) 88.4 - 84.4 %   Platelet Count 201 150 - 400 K/uL   nRBC 0.0 0.0 - 0.2 %  Neutrophils Relative % 65 %   Neutro Abs 5.3 1.7 - 7.7 K/uL   Lymphocytes Relative 20 %   Lymphs Abs 1.6 0.7 - 4.0 K/uL   Monocytes Relative 6 %   Monocytes Absolute 0.5 0.1 - 1.0 K/uL   Eosinophils Relative 8 %   Eosinophils Absolute 0.7 (H) 0.0 - 0.5 K/uL   Basophils Relative 1 %   Basophils Absolute 0.1 0.0 - 0.1 K/uL   Immature Granulocytes 0 %   Abs Immature Granulocytes 0.03 0.00 - 0.07 K/uL  CMP (Cancer Center only)  Result Value Ref Range   Sodium 138 135 - 145 mmol/L   Potassium 4.0 3.5 - 5.1 mmol/L   Chloride 99 98 - 111 mmol/L   CO2 27 22 - 32 mmol/L   Glucose, Bld 329 (H) 70 - 99 mg/dL   BUN 25 (H) 8 - 23 mg/dL   Creatinine 8.41 (H) 9.38 - 1.24 mg/dL   Calcium  9.7 8.9 - 10.3 mg/dL   Total Protein 6.7 6.5 - 8.1 g/dL   Albumin 4.2 3.5 - 5.0 g/dL   AST 24 15 - 41 U/L   ALT 24 0 - 44 U/L   Alkaline Phosphatase 49 38 - 126 U/L   Total Bilirubin 0.4 0.0 - 1.2 mg/dL   GFR, Estimated 46 (L) >60 mL/min   Anion gap 13 5 - 15  Iron and Iron Binding Capacity ([facility] only)  Result Value Ref Range   Iron 70 45 - 182 ug/dL   TIBC 666 749 - 549 ug/dL   Saturation Ratios 21 17.9 - 39.5 %   UIBC 264 ug/dL  Ferritin  Result Value Ref Range   Ferritin 119 24 - 336 ng/mL  D-dimer, quantitative  Result Value Ref Range   D-Dimer, Quant 0.60 (H) 0.00 - 0.50 ug/mL-FEU    RADIOGRAPHIC STUDIES:  No recent pertinent imaging studies available to review.  Orders Placed This Encounter  Procedures   CBC with Differential (Cancer Center Only)    Standing Status:   Future    Expected Date:   06/28/2024    Expiration Date:   09/26/2024   CMP (Cancer Center only)    Standing Status:   Future    Expected Date:   06/28/2024    Expiration Date:   09/26/2024   Iron and Iron Binding  Capacity ([facility] only)    Standing Status:   Future    Expected Date:   06/28/2024    Expiration Date:   09/26/2024   Ferritin    Standing Status:   Future    Expected Date:   06/28/2024    Expiration Date:   09/26/2024   D-dimer, quantitative    Standing Status:   Future    Expected Date:   06/28/2024    Expiration Date:   09/26/2024     Future Appointments  Date Time Provider Department Center  03/31/2024  9:40 AM Croitoru, Jerel, MD [facility] H&V  04/14/2024 11:00 AM Lomax, Amy, NP [facility] None  04/14/2024  9:00 PM [facility] SLEEP LAB [facility] None  04/18/2024 10:00 AM [facility] H&V  04/18/2024 11:00 AM [facility] 10 [facility] H&V  04/18/2024 11:40 AM Pearline Norman RAMAN, MD [facility] H&V  06/27/2024  8:45 AM [facility] None  06/27/2024  9:15 AM Karlisa Gaubert, Chinita, MD [facility] None     This document was completed utilizing speech recognition software. Grammatical errors, random word insertions, pronoun errors, and incomplete sentences are an occasional consequence of this system due  to software limitations, ambient noise, and hardware issues. Any formal questions or concerns about the content, text or information contained within the body of this dictation should be directly addressed to the provider for clarification.

## 2024-03-31 ENCOUNTER — Ambulatory Visit: Attending: Cardiovascular Disease | Admitting: Cardiovascular Disease

## 2024-03-31 ENCOUNTER — Encounter: Payer: Self-pay | Admitting: Cardiovascular Disease

## 2024-03-31 VITALS — BP 88/64 | HR 87 | Ht 68.0 in | Wt 193.0 lb

## 2024-03-31 DIAGNOSIS — E782 Mixed hyperlipidemia: Secondary | ICD-10-CM

## 2024-03-31 DIAGNOSIS — Z86711 Personal history of pulmonary embolism: Secondary | ICD-10-CM

## 2024-03-31 DIAGNOSIS — I2510 Atherosclerotic heart disease of native coronary artery without angina pectoris: Secondary | ICD-10-CM

## 2024-03-31 DIAGNOSIS — Z794 Long term (current) use of insulin: Secondary | ICD-10-CM

## 2024-03-31 DIAGNOSIS — G3184 Mild cognitive impairment, so stated: Secondary | ICD-10-CM | POA: Diagnosis not present

## 2024-03-31 DIAGNOSIS — E119 Type 2 diabetes mellitus without complications: Secondary | ICD-10-CM | POA: Diagnosis not present

## 2024-03-31 DIAGNOSIS — I5020 Unspecified systolic (congestive) heart failure: Secondary | ICD-10-CM | POA: Diagnosis not present

## 2024-03-31 DIAGNOSIS — I10 Essential (primary) hypertension: Secondary | ICD-10-CM | POA: Diagnosis not present

## 2024-03-31 DIAGNOSIS — G4733 Obstructive sleep apnea (adult) (pediatric): Secondary | ICD-10-CM | POA: Diagnosis not present

## 2024-03-31 DIAGNOSIS — N1832 Chronic kidney disease, stage 3b: Secondary | ICD-10-CM

## 2024-03-31 DIAGNOSIS — I739 Peripheral vascular disease, unspecified: Secondary | ICD-10-CM

## 2024-03-31 DIAGNOSIS — M6281 Muscle weakness (generalized): Secondary | ICD-10-CM | POA: Diagnosis not present

## 2024-03-31 DIAGNOSIS — D500 Iron deficiency anemia secondary to blood loss (chronic): Secondary | ICD-10-CM | POA: Diagnosis not present

## 2024-03-31 DIAGNOSIS — I429 Cardiomyopathy, unspecified: Secondary | ICD-10-CM

## 2024-03-31 NOTE — Patient Instructions (Signed)
 Medication Instructions:  Decrease to a half dose of Entresto  tonight, Tuesday (all day) , and Wednesday morning. Go back to normal dose Wednesday evening *If you need a refill on your cardiac medications before your next appointment, please call your pharmacy*  Lab Work: None ordered If you have labs (blood work) drawn today and your tests are completely normal, you will receive your results only by: MyChart Message (if you have MyChart) OR A paper copy in the mail If you have any lab test that is abnormal or we need to change your treatment, we will call you to review the results.  Testing/Procedures: None ordered  Follow-Up: At [facility], you and your health needs are our priority.  As part of our continuing mission to provide you with exceptional heart care, our providers are all part of one team.  This team includes your primary Cardiologist (physician) and Advanced Practice Providers or APPs (Physician Assistants and Nurse Practitioners) who all work together to provide you with the care you need, when you need it.  Your next appointment:   6 month(s)  Provider:   Jerel Balding, MD    We recommend signing up for the patient portal called MyChart.  Sign up information is provided on this After Visit Summary.  MyChart is used to connect with patients for Virtual Visits (Telemedicine).  Patients are able to view lab/test results, encounter notes, upcoming appointments, etc.  Non-urgent messages can be sent to your provider as well.   To learn more about what you can do with MyChart, go to [email or web address].

## 2024-03-31 NOTE — Progress Notes (Signed)
 Cardiology Office Note:    Date:  03/31/2024   ID:  Donald [name], DOB 1950-08-01, MRN [id number]  PCP:  Janey Santos, MD   [facility] Providers Cardiologist:  Jerel Balding, MD     Referring MD: Janey Santos, MD   Chief Complaint  Patient presents with   Fatigue     History of Present Illness:    Donald [name] is a 73 y.o. male with a hx of nonobstructive CAD, nonischemic cardiomyopathy dating back to at least 2013  (most recent LVEF 40-45% by echo September 2025), nonobstructive CAD (calcium  score 870, 84th percentile, no significant stenoses by CT angiography 2023) , history of pulmonary embolism February 2025, OSA on CPAP, history of hypertension, type 2 diabetes mellitus, hypercholesterolemia, OSA on CPAP (Dr. Chalice), chronic bronchitis, CKD stage III.    He has lost about 16 pounds on semaglutide.  He is not feeling great, although he has made some improvement compared with 3 months ago.  Over the summer he had extreme fatigue and shortness of breath.  He felt much weaker and had trouble even walking across the house.  He was found to have mild anemia and required 2 infusions of intravenous iron, after which there was partial improvement in his symptoms.  He continues to feel tired, from the minute he wakes up until the end of the day.  Energy level much lower than it was in the past and muscles are very weak.  Hard time opening a jar.  He is compliant with CPAP for OSA, but tells me that he is scheduled for repeat sleep study.  Seems to lack motivation at times, according to his wife.    He does not have orthopnea, PND, lower extremity edema.  Denies dizziness or syncope or palpitation.  He is no longer taking anticoagulants for his previous problems with pulmonary embolism since September.  He has seen hematology and his hypercoagulable workup has been unremarkable.  He continues to take aspirin  and clopidogrel .  He is on maximum dose Entresto , moderate  dose of metoprolol  succinate very low-dose spironolactone , intolerant of SGLT2 inhibitors due to dehydration and urinary tract infections in the setting of ureteral obstruction. He has been taking loop diuretics only as needed very infrequently.  He has not taken any in a couple of weeks.  He is scheduled for colonoscopy tomorrow and has been holding his Ozempic and clopidogrel .  Despite semaglutide and weight loss his glycemic control has been suboptimal with the most recent hemoglobin A1c of 9.1% in September.  Also had severe hypertriglyceridemia up to 848 at that time.  On rosuvastatin  20 mg daily.  Cholesterol 243, HDL 40, calculated LDL (caveat for probable inaccuracy due to the hypertriglyceridemia) was very low at 33.  In the past when his total cholesterol was 229 his LDL was 104.  He had abrupt onset of dyspnea in November 2024 and at that time his chest CT angiogram did not show evidence of pulmonary embolism.  We have been trying to identify the cause of his dyspnea, performed a right heart catheterization which showed normal left heart filling pressures, and were getting ready to perform a left heart catheterization just in case his unexplained dyspnea was due to coronary insufficiency.  He once again developed severe shortness of breath 07/03/2023 and this time his coronary CT angiogram showed evidence of pulmonary embolism.  Retrospectively it is very likely that the November episode and the subsequent symptoms were also due to pulmonary embolism.  Anticoagulation was  with Eliquis .  He required oxygen  supplementation 24 hours a day for a while, but now only uses oxygen  at night.    Coronary CT angiography performed in January 2023 showed extensive atherosclerotic plaque (coronary calcium  score 870, 84th percentile), but no significant discrete coronary stenoses.  He does have some lower extremity edema and he remains above our previous estimation of his dry weight of 200 pounds.  At the time  of cardiac catheterization in December 2024 he weighed 194 pounds, and his filling pressures appear to be normal (PCWP 11 mmHg, PAP 27/20, cardiac index 3.2 L/min/m, RAP 9 mmHg).  BNP on 03/31/2023, when he weighed around 200 pounds and when he was short of breath, was only 26, strongly suggesting that his shortness of breath is not from congestive heart failure (retrospectively he had a pulmonary embolism).  He had a CT angiogram of the abdominal aorta with runoff for PAD.  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  Again noted was aortic atherosclerosis.  In 2013, nuclear stress that showed a left ventricular ejection fraction of 50%, with a normal perfusion pattern and mild left ventricular dilatation.  His echocardiogram also showed an EF of 50-55% with mild diffuse left ventricular hypokinesis and no significant valvular abnormalities.  In 2016, his pulmonary specialist Dr. Geronimo ordered a cardiopulmonary excise stress test that showed mild-mod reduced functional capacity without any clear evidence of cardiovascular limitations.  Chronotropic response was normal.  The peak VO2 was mildly reduced at 23.5 mL/kilogram/minute (76% of predicted).  VE/VCO2 slope was normal.echocardiogram performed 03/22/2021 shows further reduction in LVEF which is now down to 35-40%.  Diastolic parameters suggest normal filling pressures.  The systolic PA pressure is normal at 29 mmHg. Once again there are no significant valvular abnormalities.  Despite transition to Entresto  since maximum dose), metoprolol  succinate and spironolactone  LVEF has not really increased much, now 40% on echocardiogram performed April 2023.  He had allergic response to SGLT2 inhibitors and has a history of very frequent urinary tract infections due to ureteral obstruction.   Past Medical History:  Diagnosis Date   Allergic  rhinitis, seasonal    Asthma    Bronchitis    CHF (congestive heart failure) (HCC)    Chronic kidney disease, stage I    Cognitive impairment    Effects Short Term Memory   collar bone    dislocation left side 05/2014   Degenerative joint disease of knee    bilateral   Diabetes mellitus    Type 2   Dysrhythmia    PVCs   Fatty liver    GERD (gastroesophageal reflux disease)    Headache    Heachaces r/t eyes   Heart murmur    history of   History of acute prostatitis    History of hematuria    History of hiatal hernia    H/O   Hyperlipidemia    Hypertension    IgA nephropathy    OSA on CPAP    Peripheral vascular disease    Pneumonia    Sinusitis     Past Surgical History:  Procedure Laterality Date   ABDOMINAL AORTOGRAM N/A 10/08/2023   Procedure: ABDOMINAL AORTOGRAM;  Surgeon: Pearline Norman RAMAN, MD;  Location: [facility] INVASIVE CV LAB;  Service: Cardiovascular;  Laterality: N/A;   BIOPSY  05/17/2020   Procedure: BIOPSY;  Surgeon: Rosalie Kitchens, MD;  Location: [facility] ENDOSCOPY;  Service: Endoscopy;;   CARDIAC CATHETERIZATION  09/29/2011   normal   CATARACT EXTRACTION Bilateral    CERVICAL SPINE SURGERY     COLONOSCOPY WITH PROPOFOL  N/A 05/17/2020   Procedure: COLONOSCOPY WITH PROPOFOL ;  Surgeon: Rosalie Kitchens, MD;  Location: [facility] ENDOSCOPY;  Service: Endoscopy;  Laterality: N/A;   deviated septum     1990s   ESOPHAGOGASTRODUODENOSCOPY (EGD) WITH PROPOFOL  N/A 05/17/2020   Procedure: ESOPHAGOGASTRODUODENOSCOPY (EGD) WITH PROPOFOL ;  Surgeon: Rosalie Kitchens, MD;  Location: [facility]OS[facility];  Service: Endoscopy;  Laterality: N/A;   HEMOSTASIS CLIP PLACEMENT  05/17/2020   Procedure: HEMOSTASIS CLIP PLACEMENT;  Surgeon: Rosalie Kitchens, MD;  Location: [facility] ENDOSCOPY;  Service: Endoscopy;;   HERNIA REPAIR     LEFT HEART CATHETERIZATION WITH CORONARY ANGIOGRAM N/A 09/29/2011   Procedure: LEFT HEART CATHETERIZATION WITH CORONARY ANGIOGRAM;  Surgeon: Jerel Balding, MD;  Location: [facility] CATH LAB;  Service:  Cardiovascular;  Laterality: N/A;   LOWER EXTREMITY ANGIOGRAPHY Left 10/08/2023   Procedure: Lower Extremity Angiography;  Surgeon: Pearline Norman RAMAN, MD;  Location: [facility] INVASIVE CV LAB;  Service: Cardiovascular;  Laterality: Left;   LOWER EXTREMITY INTERVENTION Left 10/08/2023   Procedure: LOWER EXTREMITY INTERVENTION;  Surgeon: Pearline Norman RAMAN, MD;  Location: [facility] INVASIVE CV LAB;  Service: Cardiovascular;  Laterality: Left;   NM MYOVIEW  LTD  07/08/2011   mild LV dilatation,mild septal hypokinesia   POLYPECTOMY  05/17/2020   Procedure: POLYPECTOMY;  Surgeon: Rosalie Kitchens, MD;  Location: [facility] ENDOSCOPY;  Service: Endoscopy;;   RIGHT HEART CATH N/A 04/17/2023   Procedure: RIGHT HEART CATH;  Surgeon: Anner Alm ORN, MD;  Location: [facility] INVASIVE CV LAB;  Service: Cardiovascular;  Laterality: N/A;   TONSILLECTOMY     US  ECHOCARDIOGRAPHY  09/07/2011   EF 35-45%,mod.ant hypokinesis,boderline LA & RA enlargement,trace MR,TR & PI    Current Medications: Current Meds  Medication Sig   aspirin  EC 81 MG tablet Take 81 mg by mouth daily. Swallow whole.   clopidogrel  (PLAVIX ) 75 MG tablet Take 1 tablet (75 mg total) by mouth daily.   Continuous Blood Gluc Sensor (FREESTYLE LIBRE 14 DAY SENSOR) MISC Inject 1 Application into the skin every 14 (fourteen) days.   finasteride  (PROSCAR ) 5 MG tablet Take 5 mg by mouth in the morning.   fluticasone  (FLONASE ) 50 MCG/ACT nasal spray Place 1 spray into both nostrils daily as needed for allergies.   furosemide  (LASIX ) 40 MG tablet Take 40 mg by mouth daily. (Patient taking differently: Take 40 mg by mouth as needed for fluid.)   insulin  lispro (HUMALOG  KWIKPEN) 200 UNIT/ML KwikPen Inject 30-130 Units into the skin in the morning, at noon, and at bedtime. Sliding scale If blood sugar is over 140   memantine  (NAMENDA ) 10 MG tablet Take 1 tablet (10 mg total) by mouth 2 (two) times daily.   metFORMIN  (GLUCOPHAGE ) 1000 MG tablet Take 1 tablet (1,000 mg total) by mouth 2  (two) times daily with a meal. Restart on 8/22   metoprolol  succinate (TOPROL -XL) 50 MG 24 hr tablet Take 50 mg by mouth 2 (two) times daily. Take with or immediately following a meal.   OZEMPIC, 1 MG/DOSE, 4 MG/3ML SOPN inject 1mg  Subcutaneous weekly; Duration: 30 days   pantoprazole  (PROTONIX ) 40 MG tablet Take 1 tablet (40 mg total) by mouth 2 (two) times daily.   rosuvastatin  (CRESTOR ) 20 MG tablet TAKE ONE TABLET BY MOUTH ONE TIME DAILY (Patient taking differently: Take 20 mg by mouth every evening.)   sacubitril -valsartan  (ENTRESTO ) 97-103 MG TAKE ONE  TABLET BY MOUTH TWICE DAILY   silodosin (RAPAFLO) 8 MG CAPS capsule Take 8 mg by mouth every evening.   spironolactone  (ALDACTONE ) 25 MG tablet Take one-half tablet by mouth daily   TRESIBA FLEXTOUCH 200 UNIT/ML FlexTouch Pen Inject 100 Units into the skin in the morning.     Allergies:   Ciprofloxacin, Jardiance [empagliflozin], Levaquin [levofloxacin], Aricept  [donepezil  hcl], Bactrim [sulfamethoxazole-trimethoprim], Breztri  aerosphere [budeson-glycopyrrol-formoterol ], and Misc. sulfonamide containing compounds   EKGs/Labs/Other Studies Reviewed:    The following studies were reviewed today: Right heart catheterization 04/17/2023:  Right Heart Pressures PAP-mean 27/20-23 mmHg.  PCWP 11 mmHg.  Ao sat 93%, PA sat 65%.   Cardiac Output-Index: Collie) 5.87-2.91; (TD)6.46-3.2  Right Atrium Right atrial pressure is elevated. Mean RAP 9 mmHg  Right Ventricle RVP-EDP 29/14-8 mmHg      RECOMMENDATIONS   In the absence of any other complications or medical issues, we expect the patient to be ready for discharge from a cath perspective on 04/17/2023.   Continue evaluate for nonpulmonary pretension etiology for dyspnea. After other options are exhausted, could consider right heart cath with bicycle to assess for exercise related elevated pressures.    Echocardiogram 01/23/2024:  1. Left ventricular ejection fraction, by estimation, is 40 to  45%. Left  ventricular ejection fraction by 2D MOD biplane is 43.9 %. The left  ventricle has mildly decreased function. The left ventricle has no  regional wall motion abnormalities. There is  moderate left ventricular hypertrophy. Left ventricular diastolic  parameters are consistent with Grade I diastolic dysfunction (impaired  relaxation). The average left ventricular global longitudinal strain is  -16.7 %. The global longitudinal strain is  abnormal.   2. Right ventricular systolic function is normal. The right ventricular  size is normal.   3. A small pericardial effusion is present. The pericardial effusion is  circumferential.   4. The mitral valve is grossly normal. Trivial mitral valve  regurgitation.   5. The aortic valve is tricuspid. Aortic valve regurgitation is not  visualized.   6. The IVC was not well-visualized.   Comparison(s): Changes from prior study are noted. 05/10/2023: LVEF 40-45%,  trivial pericardial effusion.     Event monitor 05/09/2022:    Rhythm is normal sinus rhythm with normal circadian variation.   There are rare isolated premature atrial contractions and a single 4 beat run of nonsustained atrial tachycardia.  Atrial fibrillation was not seen.   There were rare premature ventricular complexes, very rare couplets and a brief period of ventricular try Gemini.  When tachycardia is not seen.   There is no significant bradycardia.   Symptom driven recordings show isolated premature atrial contractions or premature ventricular contractions.   Minimally abnormal event monitor.  Patient's symptoms are associated with isolated ectopic beats.  EKG:   EKG Interpretation Date/Time:  Monday March 31 2024 09:39:40 EST Ventricular Rate:  87 PR Interval:  162 QRS Duration:  94 QT Interval:  396 QTC Calculation: 476 R Axis:   15  Text Interpretation: Normal sinus rhythm Nonspecific T wave abnormality When compared with ECG of 02-Jul-2023 15:55, T wave  inversion no longer evident in Anterior leads Nonspecific T wave abnormality, worse in Lateral leads Confirmed by Westen Dinino ([id number]) on 03/31/2024 9:51:24 AM          Recent Labs: 07/02/2023: B Natriuretic Peptide 40.3; Magnesium  2.0 11/13/2023: Pro B Natriuretic peptide (BNP) 7.0 01/08/2024: TSH 1.72 03/28/2024: ALT 24; BUN 25; Creatinine 1.58; Hemoglobin 13.1; Platelet Count 201; Potassium 4.0; Sodium  138  Recent Lipid Panel    Component Value Date/Time   CHOL 233 (H) 05/13/2021 0949   TRIG 386 (H) 05/13/2021 0949   HDL 40 05/13/2021 0949   CHOLHDL 5.8 (H) 05/13/2021 0949   LDLCALC 124 (H) 05/13/2021 0949     Risk Assessment/Calculations:           Physical Exam:    VS:  BP (!) 88/64 (BP Location: Left Arm, Patient Position: Sitting, Cuff Size: Normal)   Pulse 87   Ht 5' 8 (1.727 m)   Wt 193 lb (87.5 kg)   SpO2 95%   BMI 29.35 kg/m     Wt Readings from Last 3 Encounters:  03/31/24 193 lb (87.5 kg)  03/28/24 196 lb (88.9 kg)  02/27/24 195 lb 14.4 oz (88.9 kg)     General: Alert, oriented x3, no distress, looks tired Head: no evidence of trauma, PERRL, EOMI, no exophtalmos or lid lag, no myxedema, no xanthelasma; normal ears, nose and oropharynx Neck: normal jugular venous pulsations and no hepatojugular reflux; brisk carotid pulses without delay and no carotid bruits Chest: clear to auscultation, no signs of consolidation by percussion or palpation, normal fremitus, symmetrical and full respiratory excursions Cardiovascular: normal position and quality of the apical impulse, regular rhythm, normal first and second heart sounds, no murmurs, rubs or gallops Abdomen: no tenderness or distention, no masses by palpation, no abnormal pulsatility or arterial bruits, normal bowel sounds, no hepatosplenomegaly Extremities: no clubbing, cyanosis or edema; 2+ radial, ulnar and brachial pulses bilaterally; 2+ right femoral, posterior tibial and dorsalis pedis pulses; 2+ left  femoral, posterior tibial and dorsalis pedis pulses; no subclavian or femoral bruits Neurological: grossly nonfocal Psych: Normal mood and affect   ASSESSMENT:    1. History of pulmonary embolism   2. 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 Coronary artery disease involving native coronary artery of native heart without angina pectoris   4. Iron deficiency anemia due to chronic blood loss   5. Stage 3b chronic kidney disease (HCC)   6. Diabetes mellitus type 2, insulin  dependent (HCC)   7. Essential hypertension   8. Mixed hyperlipidemia   9. PAD (peripheral artery disease)   10. OSA on CPAP   11. Muscle weakness (generalized)   12. MCI (mild cognitive impairment)     PLAN:   In order of problems listed above:  Pulmonary embolism: Retrospectively, his unexplained dyspnea last year and the first part of 2025 was due to pulmonary embolism.  Symptoms improved with anticoagulation.  He has seen a hematologist (Dr. Autumn) And no secondary causes of hypercoagulable state have been identified.  He had 2 unprovoked pulmonary emboli probably, in November 2024 and again in February 2025.  He is no longer on anticoagulation, after roughly 6 months of therapy.  Currently undergoing evaluation for source of iron deficiency anemia. CHF: No signs of hypervolemia, in fact I worried that he might be a little dry. His dry weight is now  a moving target following initiation of semaglutide.  His blood pressure is low.  He is getting ready to start the prep for colonoscopy tomorrow and I am afraid his blood pressure will be even lower so I asked him to reduce his Entresto  dose in half, temporarily. Only taking loop diuretics as needed.  Last year his right heart catheterization showed normal left heart filling pressures and his recent BNP was undetectable. He has longstanding nonischemic cardiomyopathy with mildly depressed LVEF.  ,  Continue Entresto , metoprolol ,  spironolactone , unable to take SGLT2 inhibitors due to urinary tract infections. CAD: He does not have angina pectoris.  Nonobstructive by coronary CTA performed in 2023.  Ideally would like his LDL cholesterol less than 70.   Anemia: Mild iron deficiency anemia over the summer.  He was taking both antiplatelet and direct oral anticoagulant in the first half of the year.  Now has normalized hemoglobin and normalized ferritin level after intravenous iron.  Scheduled for colonoscopy tomorrow. IgA nephropathy/CKD 3: His renal function has also been highly variable this year peaking at 2.2 in July, most recently down to 1.58.  Not sure what his real baseline is.  He has started seeing Dr. Almarie Bonine at [facility]. DM: Despite semaglutide, this was poorly controlled and with severe associated hypertriglyceridemia over the summer.  Initially semaglutide appeared to work better and now the effects are waning.  Nevertheless recently has had to skip insulin  due to low blood sugar levels and it is possible his A1c level is better now.  He did not tolerate SGLT2 inhibitors. HTN: Blood pressure is low.  He has fatigue but no dizziness or syncope.  Temporarily decrease the dose of Entresto .  Make sure he stays well-hydrated. HLP: Hard to say what the LDL really is due to the severity of his hypertriglyceridemia but it is normally in target range based on his most recent labs from September.  Continue rosuvastatin . PAD: Asymptomatic.  Small bilateral popliteal aneurysms without evidence of peripheral arterial insufficiency, will be managed conservatively. OSA: He reports that he is compliant with CPAP (Monitored by Dr. Chalice).  He wears this both at night and if he takes a nap during the day.  He is scheduled for repeat sleep study and a follow-up with Dr. Chalice. Muscle weakness: I suspect this may just be deconditioning and slow recovery at advanced age, but he makes it sound like it is fairly profound.   He has difficulty opening a jar.  Briefly entertained a possible diagnosis of myasthenia, encouraged him to discuss these complaints with his neurologist. Cognitive deficits: Unchanged. Also followed by Dr. Chalice in the neurology clinic.  Note absence of specific abnormalities on PET scan of the brain and MRI of the brain (although he does have some mild chronic microvascular ischemic changes in the hemispheres and pons).       Medication Adjustments/Labs and Tests Ordered: Current medicines are reviewed at length with the patient today.  Concerns regarding medicines are outlined above.  Orders Placed This Encounter  Procedures   EKG 12-Lead   No orders of the defined types were placed in this encounter.   Patient Instructions  Medication Instructions:  Decrease to a half dose of Entresto  tonight, Tuesday (all day) , and Wednesday morning. Go back to normal dose Wednesday evening *If you need a refill on your cardiac medications before your next appointment, please call your pharmacy*  Lab Work: None ordered If you have labs (blood work) drawn today and your tests are completely normal, you will receive your results only by: MyChart Message (if you have MyChart) OR A paper copy in the mail If you have any lab test that is abnormal or we need to change your treatment, we will call you to review the results.  Testing/Procedures: None ordered  Follow-Up: At [facility], you and your health needs are our priority.  As part of our continuing mission to provide you with exceptional heart care, our providers are all part of  one team.  This team includes your primary Cardiologist (physician) and Advanced Practice Providers or APPs (Physician Assistants and Nurse Practitioners) who all work together to provide you with the care you need, when you need it.  Your next appointment:   6 month(s)  Provider:   Jerel Balding, MD    We recommend signing up for the patient portal  called MyChart.  Sign up information is provided on this After Visit Summary.  MyChart is used to connect with patients for Virtual Visits (Telemedicine).  Patients are able to view lab/test results, encounter notes, upcoming appointments, etc.  Non-urgent messages can be sent to your provider as well.   To learn more about what you can do with MyChart, go to [email or web address].      Signed, Jerel Balding, MD  03/31/2024 11:50 AM    [facility]

## 2024-04-01 ENCOUNTER — Encounter (HOSPITAL_COMMUNITY)
Admission: RE | Disposition: A | Discharge status: Home / Self Care | Payer: Self-pay | Source: Home / Self Care | Attending: Gastroenterology

## 2024-04-01 ENCOUNTER — Ambulatory Visit (HOSPITAL_COMMUNITY)
Admission: RE | Admit: 2024-04-01 | Discharge: 2024-04-01 | Disposition: A | Discharge status: Home / Self Care | Attending: Gastroenterology | Admitting: Gastroenterology

## 2024-04-01 ENCOUNTER — Ambulatory Visit (HOSPITAL_COMMUNITY): Admitting: Anesthesiology

## 2024-04-01 ENCOUNTER — Encounter (HOSPITAL_COMMUNITY): Payer: Self-pay | Admitting: Gastroenterology

## 2024-04-01 ENCOUNTER — Other Ambulatory Visit: Payer: Self-pay

## 2024-04-01 DIAGNOSIS — E1165 Type 2 diabetes mellitus with hyperglycemia: Secondary | ICD-10-CM | POA: Diagnosis not present

## 2024-04-01 DIAGNOSIS — G3184 Mild cognitive impairment, so stated: Secondary | ICD-10-CM | POA: Diagnosis not present

## 2024-04-01 DIAGNOSIS — Z9981 Dependence on supplemental oxygen: Secondary | ICD-10-CM | POA: Diagnosis not present

## 2024-04-01 DIAGNOSIS — K648 Other hemorrhoids: Secondary | ICD-10-CM | POA: Diagnosis not present

## 2024-04-01 DIAGNOSIS — K219 Gastro-esophageal reflux disease without esophagitis: Secondary | ICD-10-CM | POA: Diagnosis not present

## 2024-04-01 DIAGNOSIS — D123 Benign neoplasm of transverse colon: Secondary | ICD-10-CM | POA: Diagnosis not present

## 2024-04-01 DIAGNOSIS — Z86711 Personal history of pulmonary embolism: Secondary | ICD-10-CM | POA: Diagnosis not present

## 2024-04-01 DIAGNOSIS — I129 Hypertensive chronic kidney disease with stage 1 through stage 4 chronic kidney disease, or unspecified chronic kidney disease: Secondary | ICD-10-CM

## 2024-04-01 DIAGNOSIS — G4733 Obstructive sleep apnea (adult) (pediatric): Secondary | ICD-10-CM | POA: Diagnosis not present

## 2024-04-01 DIAGNOSIS — N1832 Chronic kidney disease, stage 3b: Secondary | ICD-10-CM | POA: Diagnosis not present

## 2024-04-01 DIAGNOSIS — K635 Polyp of colon: Secondary | ICD-10-CM | POA: Diagnosis not present

## 2024-04-01 DIAGNOSIS — K317 Polyp of stomach and duodenum: Secondary | ICD-10-CM | POA: Diagnosis not present

## 2024-04-01 DIAGNOSIS — I5022 Chronic systolic (congestive) heart failure: Secondary | ICD-10-CM | POA: Diagnosis not present

## 2024-04-01 DIAGNOSIS — Z09 Encounter for follow-up examination after completed treatment for conditions other than malignant neoplasm: Secondary | ICD-10-CM | POA: Diagnosis present

## 2024-04-01 DIAGNOSIS — E782 Mixed hyperlipidemia: Secondary | ICD-10-CM | POA: Diagnosis not present

## 2024-04-01 DIAGNOSIS — E1122 Type 2 diabetes mellitus with diabetic chronic kidney disease: Secondary | ICD-10-CM | POA: Diagnosis not present

## 2024-04-01 DIAGNOSIS — D500 Iron deficiency anemia secondary to blood loss (chronic): Secondary | ICD-10-CM | POA: Diagnosis not present

## 2024-04-01 DIAGNOSIS — I130 Hypertensive heart and chronic kidney disease with heart failure and stage 1 through stage 4 chronic kidney disease, or unspecified chronic kidney disease: Secondary | ICD-10-CM | POA: Diagnosis not present

## 2024-04-01 DIAGNOSIS — K449 Diaphragmatic hernia without obstruction or gangrene: Secondary | ICD-10-CM | POA: Diagnosis not present

## 2024-04-01 DIAGNOSIS — E1151 Type 2 diabetes mellitus with diabetic peripheral angiopathy without gangrene: Secondary | ICD-10-CM | POA: Diagnosis not present

## 2024-04-01 DIAGNOSIS — I2510 Atherosclerotic heart disease of native coronary artery without angina pectoris: Secondary | ICD-10-CM | POA: Diagnosis not present

## 2024-04-01 DIAGNOSIS — Z1211 Encounter for screening for malignant neoplasm of colon: Secondary | ICD-10-CM | POA: Diagnosis not present

## 2024-04-01 DIAGNOSIS — K644 Residual hemorrhoidal skin tags: Secondary | ICD-10-CM | POA: Diagnosis not present

## 2024-04-01 DIAGNOSIS — K5730 Diverticulosis of large intestine without perforation or abscess without bleeding: Secondary | ICD-10-CM | POA: Diagnosis not present

## 2024-04-01 DIAGNOSIS — M6281 Muscle weakness (generalized): Secondary | ICD-10-CM | POA: Diagnosis not present

## 2024-04-01 DIAGNOSIS — I428 Other cardiomyopathies: Secondary | ICD-10-CM | POA: Diagnosis not present

## 2024-04-01 DIAGNOSIS — J42 Unspecified chronic bronchitis: Secondary | ICD-10-CM | POA: Diagnosis not present

## 2024-04-01 DIAGNOSIS — N1831 Chronic kidney disease, stage 3a: Secondary | ICD-10-CM | POA: Diagnosis not present

## 2024-04-01 HISTORY — PX: POLYPECTOMY: SHX149

## 2024-04-01 HISTORY — PX: ESOPHAGOGASTRODUODENOSCOPY: SHX5428

## 2024-04-01 HISTORY — PX: COLONOSCOPY: SHX5424

## 2024-04-01 HISTORY — PX: BIOPSY OF SKIN SUBCUTANEOUS TISSUE AND/OR MUCOUS MEMBRANE: SHX6741

## 2024-04-01 LAB — GLUCOSE, CAPILLARY: Glucose-Capillary: 177 mg/dL — ABNORMAL HIGH (ref 70–99)

## 2024-04-01 SURGERY — COLONOSCOPY
Anesthesia: Monitor Anesthesia Care

## 2024-04-01 MED ORDER — PROPOFOL 500 MG/50ML IV EMUL
INTRAVENOUS | Status: DC | PRN
  Administered 2024-04-01: 100 mg via INTRAVENOUS
  Administered 2024-04-01: 150 ug/kg/min via INTRAVENOUS

## 2024-04-01 MED ORDER — PROPOFOL 1000 MG/100ML IV EMUL
INTRAVENOUS | Status: AC
  Filled 2024-04-01: qty 100

## 2024-04-01 MED ORDER — SODIUM CHLORIDE 0.9 % IV SOLN: INTRAVENOUS | Status: DC

## 2024-04-01 MED ORDER — SODIUM CHLORIDE 0.9 % IV SOLN: INTRAVENOUS | Status: DC | PRN

## 2024-04-01 MED ORDER — SODIUM CHLORIDE 0.9 % IV SOLN
INTRAVENOUS | Status: AC | PRN
  Administered 2024-04-01: 500 mL via INTRAMUSCULAR

## 2024-04-01 NOTE — Anesthesia Postprocedure Evaluation (Signed)
 Anesthesia Post Note  Patient: Donald [name]  Procedure(s) Performed: COLONOSCOPY EGD (ESOPHAGOGASTRODUODENOSCOPY) BIOPSY, SKIN, SUBCUTANEOUS TISSUE, OR MUCOUS MEMBRANE POLYPECTOMY, INTESTINE     Patient location during evaluation: Endoscopy Anesthesia Type: MAC Level of consciousness: awake Pain management: pain level controlled Vital Signs Assessment: post-procedure vital signs reviewed and stable Respiratory status: spontaneous breathing Cardiovascular status: blood pressure returned to baseline Postop Assessment: no apparent nausea or vomiting Anesthetic complications: no   No notable events documented.  Last Vitals:  Vitals:   04/01/24 0950 04/01/24 0957  BP: 131/88   Pulse: 69 63  Resp: (!) 22 14  Temp:    SpO2: 94% 94%    Last Pain:  Vitals:   04/01/24 0957  TempSrc:   PainSc: 0-No pain                 Lauraine KATHEE [place]

## 2024-04-01 NOTE — Op Note (Signed)
 [facility] Patient Name: Donald [name] Procedure Date: 04/01/2024 MRN: [id number] Attending MD: Oliva Boots , MD, [id number] Date of Birth: October 08, 1950 CSN: [id number] Age: 73 Admit Type: Outpatient Procedure:                Colonoscopy Indications:              High risk colon cancer surveillance: Personal                            history of colonic polyps, High risk colon cancer                            surveillance: Personal history of adenoma less than                            10 mm in size, Last colonoscopy: January 2022 Providers:                Oliva Boots, MD, Gregoria Pierce, RN, Curtistine Bishop, Technician Referring MD:              Medicines:                Monitored Anesthesia Care Complications:            No immediate complications. Estimated Blood Loss:     Estimated blood loss: none. Procedure:                Pre-Anesthesia Assessment:                           - Prior to the procedure, a History and Physical                            was performed, and patient medications and                            allergies were reviewed. The patient's tolerance of                            previous anesthesia was also reviewed. The risks                            and benefits of the procedure and the sedation                            options and risks were discussed with the patient.                            All questions were answered, and informed consent                            was obtained. Prior Anticoagulants: The patient has  taken Plavix  (clopidogrel ), last dose was 4 days                            prior to procedure. ASA Grade Assessment: III - A                            patient with severe systemic disease. After                            reviewing the risks and benefits, the patient was                            deemed in satisfactory condition to undergo the                             procedure.                           - Prior to the procedure, a History and Physical                            was performed, and patient medications and                            allergies were reviewed. The patient's tolerance of                            previous anesthesia was also reviewed. The risks                            and benefits of the procedure and the sedation                            options and risks were discussed with the patient.                            All questions were answered, and informed consent                            was obtained. Prior Anticoagulants: The patient has                            taken Plavix  (clopidogrel ), last dose was 4 days                            prior to procedure. ASA Grade Assessment: III - A                            patient with severe systemic disease. After                            reviewing the risks and benefits, the patient was  deemed in satisfactory condition to undergo the                            procedure.                           After obtaining informed consent, the colonoscope                            was passed under direct vision. Throughout the                            procedure, the patient's blood pressure, pulse, and                            oxygen  saturations were monitored continuously. The                            [id number]-HQ190DL ([id number] Olympus colonoscope was                            introduced through the anus and advanced to the the                            cecum, identified by appendiceal orifice and                            ileocecal valve. The colonoscopy was performed                            without difficulty. The patient tolerated the                            procedure well. The quality of the bowel                            preparation was adequate to identify polyps greater                            than 5 mm in size. The  ileocecal valve, appendiceal                            orifice, and rectum were photographed. Scope In: 9:00:34 AM Scope Out: 9:15:25 AM Scope Withdrawal Time: 0 hours 10 minutes 11 seconds  Total Procedure Duration: 0 hours 14 minutes 51 seconds  Findings:      External and internal hemorrhoids were found during retroflexion, during       perianal exam and during digital exam. The hemorrhoids were small.      Scattered small-medium-mouthed diverticula were found in the sigmoid       colon, mid descending colon and distal descending colon.      A diminutive polyp was found in the mid transverse colon. The polyp was       semi-sessile. Biopsies were taken with a cold forceps for histology.      The exam was  otherwise without abnormality on direct and retroflexion       views. Impression:               - External and internal hemorrhoids.                           - Diverticulosis in the sigmoid colon, in the mid                            descending colon and in the distal descending colon.                           - One diminutive polyp in the mid transverse colon.                            Biopsied.                           - The examination was otherwise normal on direct                            and retroflexion views. Moderate Sedation:      Not Applicable - Patient had care per Anesthesia. Recommendation:           - Patient has a contact number available for                            emergencies. The signs and symptoms of potential                            delayed complications were discussed with the                            patient. Return to normal activities tomorrow.                            Written discharge instructions were provided to the                            patient.                           - Soft diet today.                           - Continue present medications.                           - Await pathology results.                           -  Repeat colonoscopy date to be determined after                            pending pathology results are reviewed for  surveillance based on pathology results. If                            adenomatous recheck in 5 years if not possibly just                            check as needed                           - Return to GI office PRN.                           - Telephone GI clinic for pathology results in 1                            week.                           - Resume Plavix  (clopidogrel ) at prior dose                            tomorrow. Procedure Code(s):        --- Professional ---                           [phone], Colonoscopy, flexible; with biopsy, single                            or multiple Diagnosis Code(s):        --- Professional ---                           D12.3, Benign neoplasm of transverse colon (hepatic                            flexure or splenic flexure)                           Z86.010, Personal history of colonic polyps CPT copyright 2022 [employer]. All rights reserved. The codes documented in this report are preliminary and upon coder review may  be revised to meet current compliance requirements. Oliva Boots, MD 04/01/2024 9:33:31 AM This report has been signed electronically. Number of Addenda: 0

## 2024-04-01 NOTE — Op Note (Signed)
 [facility] Patient Name: Donald [name] Procedure Date: 04/01/2024 MRN: [id number] Attending MD: Oliva Boots , MD, [id number] Date of Birth: 1951-02-16 CSN: [id number] Age: 73 Admit Type: Outpatient Procedure:                Upper GI endoscopy Indications:              Follow-up of gastric polyps Providers:                Oliva Boots, MD, Gregoria Pierce, RN, Curtistine Bishop, Technician Referring MD:              Medicines:                Monitored Anesthesia Care Complications:            No immediate complications. Estimated Blood Loss:     Estimated blood loss: none. Procedure:                Pre-Anesthesia Assessment:                           - Prior to the procedure, a History and Physical                            was performed, and patient medications and                            allergies were reviewed. The patient's tolerance of                            previous anesthesia was also reviewed. The risks                            and benefits of the procedure and the sedation                            options and risks were discussed with the patient.                            All questions were answered, and informed consent                            was obtained. Prior Anticoagulants: The patient has                            taken Plavix  (clopidogrel ), last dose was 4 days                            prior to procedure. ASA Grade Assessment: III - A                            patient with severe systemic disease. After  reviewing the risks and benefits, the patient was                            deemed in satisfactory condition to undergo the                            procedure.                           After obtaining informed consent, the endoscope was                            passed under direct vision. Throughout the                            procedure, the patient's blood pressure,  pulse, and                            oxygen  saturations were monitored continuously. The                            [id number]-[id number] ([id number]) Olympus endoscope was introduced                            through the mouth, and advanced to the third part                            of duodenum. The upper GI endoscopy was                            accomplished without difficulty. The patient                            tolerated the procedure well. Scope In: Scope Out: Findings:      A small hiatal hernia was present.      Multiple small- medium semi-pedunculated polyps and semisessile with no       bleeding and no stigmata of recent bleeding were found in the gastric       fundus and in the gastric body. Biopsies were taken with a cold forceps       for histology of the largest group mid and proximal separate.      The duodenal bulb, first portion of the duodenum, second portion of the       duodenum and third portion of the duodenum were normal.      The cardia and gastric fundus were normal on retroflexion. Impression:               - Small hiatal hernia.                           - Multiple gastric polyps. Biopsied.                           - Normal duodenal bulb, first portion of the  duodenum, second portion of the duodenum and third                            portion of the duodenum. Moderate Sedation:      Not Applicable - Patient had care per Anesthesia. Recommendation:           - Patient has a contact number available for                            emergencies. The signs and symptoms of potential                            delayed complications were discussed with the                            patient. Return to normal activities tomorrow.                            Written discharge instructions were provided to the                            patient.                           - Soft diet today.                           - Continue present medications.                            - Await pathology results.                           - Return to GI clinic PRN.                           - Telephone GI clinic for pathology results in 1                            week.                           - Telephone GI clinic if symptomatic PRN.                           - Resume Plavix  (clopidogrel ) at prior dose                            tomorrow.                           - Perform a colonoscopy today. Procedure Code(s):        --- Professional ---                           [phone], Esophagogastroduodenoscopy, flexible,  transoral; with biopsy, single or multiple Diagnosis Code(s):        --- Professional ---                           K44.9, Diaphragmatic hernia without obstruction or                            gangrene                           K31.7, Polyp of stomach and duodenum CPT copyright 2022 American Medical Association. All rights reserved. The codes documented in this report are preliminary and upon coder review may  be revised to meet current compliance requirements. Oliva Boots, MD 04/01/2024 9:28:28 AM This report has been signed electronically. Number of Addenda: 0

## 2024-04-01 NOTE — Progress Notes (Signed)
 Donald [name] Donald [name] 7:43 AM  Subjective: Patient seen and examined and he has had a mild change in bowel habits although he is only going every other day it is loose not diarrhea but no other GI complaints we rediscussed the procedure  Objective: Vital signs stable afebrile no acute distress exam please see preassessment evaluation  Assessment: Hyperplastic gastric polyps history of colon polyps due for repeat screening  Plan: Okay to proceed with endoscopy and colonoscopy with anesthesia assistance  [facility]  office [phone] After 5PM or if no answer call [phone]

## 2024-04-01 NOTE — Discharge Instructions (Addendum)
 Call if question or problem otherwise may restart Plavix  tomorrow if doing well and call for biopsy report in 1 week and follow-up as needed from a GI standpointYOU HAD AN ENDOSCOPIC PROCEDURE TODAY: Refer to the procedure report and other information in the discharge instructions given to you for any specific questions about what was found during the examination. If this information does not answer your questions, please call [facility] office at [phone] to clarify.   YOU SHOULD EXPECT: Some feelings of bloating in the abdomen. Passage of more gas than usual. Walking can help get rid of the air that was put into your GI tract during the procedure and reduce the bloating. If you had a lower endoscopy (such as a colonoscopy or flexible sigmoidoscopy) you may notice spotting of blood in your stool or on the toilet paper. Some abdominal soreness may be present for a day or two, also.  DIET: Your first meal following the procedure should be a light meal and then it is ok to progress to your normal diet. A half-sandwich or bowl of soup is an example of a good first meal. Heavy or fried foods are harder to digest and may make you feel nauseous or bloated. Drink plenty of fluids but you should avoid alcoholic beverages for 24 hours. If you had a esophageal dilation, please see attached instructions for diet.    ACTIVITY: Your care partner should take you home directly after the procedure. You should plan to take it easy, moving slowly for the rest of the day. You can resume normal activity the day after the procedure however YOU SHOULD NOT DRIVE, use power tools, machinery or perform tasks that involve climbing or major physical exertion for 24 hours (because of the sedation medicines used during the test).   SYMPTOMS TO REPORT IMMEDIATELY: A gastroenterologist can be reached at any hour. Please call [phone]  for any of the following symptoms:  Following lower endoscopy (colonoscopy, flexible  sigmoidoscopy) Excessive amounts of blood in the stool  Significant tenderness, worsening of abdominal pains  Swelling of the abdomen that is new, acute  Fever of 100 or higher  Following upper endoscopy (EGD, EUS, ERCP, esophageal dilation) Vomiting of blood or coffee ground material  New, significant abdominal pain  New, significant chest pain or pain under the shoulder blades  Painful or persistently difficult swallowing  New shortness of breath  Black, tarry-looking or red, bloody stools  FOLLOW UP:  If any biopsies were taken you will be contacted by phone or by letter within the next 1-3 weeks. Call [phone]  if you have not heard about the biopsies in 3 weeks.  Please also call with any specific questions about appointments or follow up tests.

## 2024-04-01 NOTE — Anesthesia Preprocedure Evaluation (Addendum)
 Anesthesia Evaluation  Patient identified by MRN, date of birth, ID band Patient awake    Reviewed: Allergy & Precautions, NPO status , Patient's Chart, lab work & pertinent test results, reviewed documented beta blocker date and time   History of Anesthesia Complications Negative for: history of anesthetic complications  Airway Mallampati: II  TM Distance: >3 FB Neck ROM: Full    Dental  (+) Teeth Intact, Dental Advisory Given   Pulmonary sleep apnea (5L O2 at night + CPAP), Continuous Positive Airway Pressure Ventilation and Oxygen  sleep apnea , PE   breath sounds clear to auscultation       Cardiovascular hypertension, Pt. on medications and Pt. on home beta blockers + Peripheral Vascular Disease and +CHF (NICM)  + dysrhythmias  Rhythm:Regular Rate:Normal     Neuro/Psych    GI/Hepatic hiatal hernia,GERD  Medicated and Controlled,,  Endo/Other  diabetes (Last A1c of 9.2%), Poorly Controlled, Type 2  GLP1-agonist (last dose 03/24/24)  Renal/GU Renal InsufficiencyRenal disease     Musculoskeletal   Abdominal   Peds  Hematology Hgb 13.1, Plts 201K (03/28/24); Plavix  (last dose 03/28/24)   Anesthesia Other Findings   Reproductive/Obstetrics                              Anesthesia Physical Anesthesia Plan  ASA: 3  Anesthesia Plan: MAC   Post-op Pain Management:    Induction: Intravenous  PONV Risk Score and Plan: 2 and Propofol  infusion and Treatment may vary due to age or medical condition  Airway Management Planned: Natural Airway and Nasal Cannula  Additional Equipment: None  Intra-op Plan:   Post-operative Plan:   Informed Consent:   Plan Discussed with: CRNA  Anesthesia Plan Comments:          Anesthesia Quick Evaluation

## 2024-04-01 NOTE — Transfer of Care (Signed)
 Immediate Anesthesia Transfer of Care Note  Patient: TOBIN WITUCKI  Procedure(s) Performed: COLONOSCOPY EGD (ESOPHAGOGASTRODUODENOSCOPY) BIOPSY, SKIN, SUBCUTANEOUS TISSUE, OR MUCOUS MEMBRANE POLYPECTOMY, INTESTINE  Patient Location: PACU and Endoscopy Unit  Anesthesia Type:MAC  Level of Consciousness: awake, alert , and oriented  Airway & Oxygen  Therapy: Patient Spontanous Breathing and Patient connected to nasal cannula oxygen   Post-op Assessment: Report given to RN  Post vital signs: Reviewed and stable  Last Vitals:  Vitals Value Taken Time  BP    Temp    Pulse 83 04/01/24 09:25  Resp 22 04/01/24 09:25  SpO2 93 % 04/01/24 09:25  Vitals shown include unfiled device data.  Last Pain:  Vitals:   04/01/24 0722  TempSrc: Temporal  PainSc: 0-No pain         Complications: No notable events documented.

## 2024-04-01 NOTE — Anesthesia Procedure Notes (Signed)
 Procedure Name: MAC Date/Time: 04/01/2024 9:00 AM  Performed by: Obadiah Reyes BROCKS, CRNAOxygen Delivery Method: Simple face mask Preoxygenation: Pre-oxygenation with 100% oxygen  Induction Type: IV induction

## 2024-04-02 ENCOUNTER — Encounter (HOSPITAL_COMMUNITY): Payer: Self-pay | Admitting: Gastroenterology

## 2024-04-02 LAB — SURGICAL PATHOLOGY

## 2024-04-10 NOTE — Patient Instructions (Signed)
 Below is our plan:  We will continue memantine  10mg  twice daily   Please continue using your CPAP regularly. While your insurance requires that you use CPAP at least 4 hours each night on 70% of the nights, I recommend, that you not skip any nights and use it throughout the night if you can. Getting used to CPAP and staying with the treatment long term does take time and patience and discipline. Untreated obstructive sleep apnea when it is moderate to severe can have an adverse impact on cardiovascular health and raise her risk for heart disease, arrhythmias, hypertension, congestive heart failure, stroke and diabetes. Untreated obstructive sleep apnea causes sleep disruption, nonrestorative sleep, and sleep deprivation. This can have an impact on your day to day functioning and cause daytime sleepiness and impairment of cognitive function, memory loss, mood disturbance, and problems focussing. Using CPAP regularly can improve these symptoms.  We will update supply orders, today.   Please make sure you are staying well hydrated. I recommend 50-60 ounces daily. Well balanced diet and regular exercise encouraged. Consistent sleep schedule with 6-8 hours recommended.   Please continue follow up with care team as directed.   Follow up with Dr Chalice in 6 months  You may receive a survey regarding today's visit. I encourage you to leave honest feed back as I do use this information to improve patient care. Thank you for seeing me today!

## 2024-04-10 NOTE — Progress Notes (Unsigned)
 SABRA

## 2024-04-10 NOTE — Progress Notes (Unsigned)
 PATIENT: Donald [name] DOB: 1950-06-12  REASON FOR VISIT: follow up HISTORY FROM: patient  No chief complaint on file.    HISTORY OF PRESENT ILLNESS:  04/10/24 ALL:  Donald [name] returns for follow up for OSA on CPAP with supplemental O2 and memory loss. He was last seen 08/2023 by Dr Chalice. MOCA 26/30. Since,   He has continued memantine  10mg  BID.   08/24/2022 ALL:  Donald [name] is a 73 y.o. male here today for follow up for OSA on CPAP with supplemental O2 and memory loss. He was last seen by Dr Chalice 02/2022. He was advised to continue CPAP with 4L O2 and return in 6 months for MOCA. MOCA 26/30 05/2021. He was started on memantine  10mg  BID in 10/2020. He seems to tolerate well. Donepezil  started in 2021 but discontinued due to nausea. Neurocog eval with Dr Corina 12/2021 showed mild reduction in global cognitive functioning. Mild worsening of processing speed but other cognitive domains stable. No clear neurodegenerative findings. He continues memantine  10mg  BID. He is tolerating well. He feels memory is stable. He notes period of time where he can't remember what he is supposed to be doing. He feels recall is good. He does admit to being easily distracted. He is sleeping well. He reports a total sleep time of about 11 hours a day. He takes naps throughout the day. He is trying to exercise regularly. 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 Last A1C 8.5. He admits that he forgets to take his insulin  at times. He has not been eating as healthy as he was. He has chronic back pain. He is followed by [facility]. He recently had a nerve ablation. He admits to more depression at times. He is followed by Dr Janey. He feels duloxetine  helps with neuropathy and mood management.     HISTORY: (copied from Dr Dohmeier's previous note)  [name] is a 73 y.o. year old Caucasian male patient seen here in a RV on 02/21/2022.  The patient was tested in a home  sleep test  in 2020, now retested by CPP titration in lab:  Study date was 25 December 2021 and the patient had a previous home sleep test that had shown an AHI of 37.9 with associated hypoxia and prescribed an auto CPAP titration device he was now invited for an in lab titration also to see if oxygen  is in addition noted he slept in supine position for much of the night and apnea hypopnea index was 2.5 overall to my great surprise.  He was a started on 7 cmH2O CPAP switched to a full facemask during the titration and was given 1 L of oxygen  the oxygen  was later removed again as the CPAP pressure was escalated to 10 cm and oxygen  sats remained  about 88%. Final pressure was 17 cm water.  We reset his CPAP to 5-16 cm water, 3 cm EPR and he was not given a script for oxygen . Mr. Lupinacci has used oxygen  for almost 10 years now since he was diagnosed with hypoxemia in 2014.  He has relied on oxygen  when he travels to be brought to his destination, he uses an oxygen  concentrator at home but of course that 1 does not allow him to travel with.  There were also several nights documented on the medical grade oxygen  pulse oximetry and they show that the patient had between 63 and 40% of sleep at night in hypoxemia that means  at 89 or below saturation.   Based on these data which can be variable from night to night I think that the patient should continue his oxygen  at 4 L and that it should be bled into the CPAP with the new settings. His machine was currently 66-1/72 years old so it has a good 18 months to go.    REVIEW OF SYSTEMS: Out of a complete 14 system review of symptoms, the patient complains only of the following symptoms, memory loss, chronic back pain, neuropathy, depression and all other reviewed systems are negative.  ESS: 5/24  ALLERGIES: Allergies  Allergen Reactions   Ciprofloxacin     Tendons problem in shoulder Other reaction(s): Arthralgias (intolerance), Myalgias (intolerance)   Jardiance  [Empagliflozin] Other (See Comments)    Dehydration, uti.   Levaquin [Levofloxacin]     Other reaction(s): Arthralgias (intolerance), Myalgias (intolerance)   Aricept  [Donepezil  Hcl] Nausea Only   Bactrim [Sulfamethoxazole-Trimethoprim] Hives   Breztri  Aerosphere [Budeson-Glycopyrrol-Formoterol ] Other (See Comments)    Patient and wife are unsure what happened, however they remember he had a bad reaction   Misc. Sulfonamide Containing Compounds Rash    HOME MEDICATIONS: Outpatient Medications Prior to Visit  Medication Sig Dispense Refill   aspirin  EC 81 MG tablet Take 81 mg by mouth daily. Swallow whole.     cetirizine (ZYRTEC) 10 MG tablet Take 10 mg by mouth every evening. (Patient not taking: Reported on 03/31/2024)     clopidogrel  (PLAVIX ) 75 MG tablet Take 1 tablet (75 mg total) by mouth daily.     Continuous Blood Gluc Sensor (FREESTYLE LIBRE 14 DAY SENSOR) MISC Inject 1 Application into the skin every 14 (fourteen) days.     finasteride  (PROSCAR ) 5 MG tablet Take 5 mg by mouth in the morning.     fluticasone  (FLONASE ) 50 MCG/ACT nasal spray Place 1 spray into both nostrils daily as needed for allergies.     furosemide  (LASIX ) 40 MG tablet Take 40 mg by mouth daily. (Patient taking differently: Take 40 mg by mouth as needed for fluid.)     insulin  lispro (HUMALOG  KWIKPEN) 200 UNIT/ML KwikPen Inject 30-130 Units into the skin in the morning, at noon, and at bedtime. Sliding scale If blood sugar is over 140     memantine  (NAMENDA ) 10 MG tablet Take 1 tablet (10 mg total) by mouth 2 (two) times daily. 180 tablet 3   metFORMIN  (GLUCOPHAGE ) 1000 MG tablet Take 1 tablet (1,000 mg total) by mouth 2 (two) times daily with a meal. Restart on 8/22     metoprolol  succinate (TOPROL -XL) 50 MG 24 hr tablet Take 50 mg by mouth 2 (two) times daily. Take with or immediately following a meal.     OZEMPIC, 1 MG/DOSE, 4 MG/3ML SOPN inject 1mg  Subcutaneous weekly; Duration: 30 days     pantoprazole   (PROTONIX ) 40 MG tablet Take 1 tablet (40 mg total) by mouth 2 (two) times daily. 180 tablet 0   rosuvastatin  (CRESTOR ) 20 MG tablet TAKE ONE TABLET BY MOUTH ONE TIME DAILY (Patient taking differently: Take 20 mg by mouth every evening.) 90 tablet 3   sacubitril -valsartan  (ENTRESTO ) 97-103 MG TAKE ONE TABLET BY MOUTH TWICE DAILY 180 tablet 3   silodosin (RAPAFLO) 8 MG CAPS capsule Take 8 mg by mouth every evening.     spironolactone  (ALDACTONE ) 25 MG tablet Take one-half tablet by mouth daily 45 tablet 3   TRESIBA FLEXTOUCH 200 UNIT/ML FlexTouch Pen Inject 100 Units into the skin in the morning.  No facility-administered medications prior to visit.    PAST MEDICAL HISTORY: Past Medical History:  Diagnosis Date   Allergic rhinitis, seasonal    Asthma    Bronchitis    CHF (congestive heart failure) (HCC)    Chronic kidney disease, stage I    Cognitive impairment    Effects Short Term Memory   collar bone    dislocation left side 05/2014   Degenerative joint disease of knee    bilateral   Diabetes mellitus    Type 2   Dysrhythmia    PVCs   Fatty liver    GERD (gastroesophageal reflux disease)    Headache    Heachaces r/t eyes   Heart murmur    history of   History of acute prostatitis    History of hematuria    History of hiatal hernia    H/O   Hyperlipidemia    Hypertension    IgA nephropathy    OSA on CPAP    Peripheral vascular disease    Pneumonia    Sinusitis     PAST SURGICAL HISTORY: Past Surgical History:  Procedure Laterality Date   ABDOMINAL AORTOGRAM N/A 10/08/2023   Procedure: ABDOMINAL AORTOGRAM;  Surgeon: Pearline Norman RAMAN, MD;  Location: [facility] INVASIVE CV LAB;  Service: Cardiovascular;  Laterality: N/A;   BIOPSY  05/17/2020   Procedure: BIOPSY;  Surgeon: Rosalie Kitchens, MD;  Location: [facility];  Service: Endoscopy;;   BIOPSY OF SKIN SUBCUTANEOUS TISSUE AND/OR MUCOUS MEMBRANE  04/01/2024   Procedure: BIOPSY, SKIN, SUBCUTANEOUS TISSUE, OR MUCOUS MEMBRANE;   Surgeon: Rosalie Kitchens, MD;  Location: [facility];  Service: Gastroenterology;;   CARDIAC CATHETERIZATION  09/29/2011   normal   CATARACT EXTRACTION Bilateral    CERVICAL SPINE SURGERY     COLONOSCOPY N/A 04/01/2024   Procedure: COLONOSCOPY;  Surgeon: Rosalie Kitchens, MD;  Location: [facility];  Service: Gastroenterology;  Laterality: N/A;   COLONOSCOPY WITH PROPOFOL  N/A 05/17/2020   Procedure: COLONOSCOPY WITH PROPOFOL ;  Surgeon: Rosalie Kitchens, MD;  Location: [facility];  Service: Endoscopy;  Laterality: N/A;   deviated septum     1990s   ESOPHAGOGASTRODUODENOSCOPY N/A 04/01/2024   Procedure: EGD (ESOPHAGOGASTRODUODENOSCOPY);  Surgeon: Rosalie Kitchens, MD;  Location: [facility];  Service: Gastroenterology;  Laterality: N/A;   ESOPHAGOGASTRODUODENOSCOPY (EGD) WITH PROPOFOL  N/A 05/17/2020   Procedure: ESOPHAGOGASTRODUODENOSCOPY (EGD) WITH PROPOFOL ;  Surgeon: Rosalie Kitchens, MD;  Location: [facility];  Service: Endoscopy;  Laterality: N/A;   HEMOSTASIS CLIP PLACEMENT  05/17/2020   Procedure: HEMOSTASIS CLIP PLACEMENT;  Surgeon: Rosalie Kitchens, MD;  Location: [facility];  Service: Endoscopy;;   HERNIA REPAIR     LEFT HEART CATHETERIZATION WITH CORONARY ANGIOGRAM N/A 09/29/2011   Procedure: LEFT HEART CATHETERIZATION WITH CORONARY ANGIOGRAM;  Surgeon: Jerel Balding, MD;  Location: [facility] CATH LAB;  Service: Cardiovascular;  Laterality: N/A;   LOWER EXTREMITY ANGIOGRAPHY Left 10/08/2023   Procedure: Lower Extremity Angiography;  Surgeon: Pearline Norman RAMAN, MD;  Location: [facility] INVASIVE CV LAB;  Service: Cardiovascular;  Laterality: Left;   LOWER EXTREMITY INTERVENTION Left 10/08/2023   Procedure: LOWER EXTREMITY INTERVENTION;  Surgeon: Pearline Norman RAMAN, MD;  Location: [facility] INVASIVE CV LAB;  Service: Cardiovascular;  Laterality: Left;   NM MYOVIEW  LTD  07/08/2011   mild LV dilatation,mild septal hypokinesia   POLYPECTOMY  05/17/2020   Procedure: POLYPECTOMY;  Surgeon: Rosalie Kitchens, MD;  Location: [facility];   Service: Endoscopy;;   POLYPECTOMY  04/01/2024   Procedure: POLYPECTOMY, INTESTINE;  Surgeon: Rosalie Kitchens, MD;  Location:  [facility];  Service: Gastroenterology;;   RIGHT HEART CATH N/A 04/17/2023   Procedure: RIGHT HEART CATH;  Surgeon: Anner Alm ORN, MD;  Location: [facility] INVASIVE CV LAB;  Service: Cardiovascular;  Laterality: N/A;   TONSILLECTOMY     US  ECHOCARDIOGRAPHY  09/07/2011   EF 35-45%,mod.ant hypokinesis,boderline LA & RA enlargement,trace MR,TR & PI    FAMILY HISTORY: Family History  Problem Relation Age of Onset   Coronary artery disease Father    Diabetes type II Father    Heart attack Father     SOCIAL HISTORY: Social History   Socioeconomic History   Marital status: Married    Spouse name: Diane   Number of children: 1   Years of education: 14   Highest education level: Not on file  Occupational History   Occupation: Teaching Laboratory Technician: Nurse, [employer]  Tobacco Use   Smoking status: Never   Smokeless tobacco: Never   Tobacco comments:    Second hand smoke for 26 years per pt  Vaping Use   Vaping status: Never Used  Substance and Sexual Activity   Alcohol  use: No    Alcohol /week: 0.0 standard drinks of alcohol    Drug use: No   Sexual activity: Yes  Other Topics Concern   Not on file  Social History Narrative   Patient is married (Diane) and lives at home with his wife.   Patient has one child.   Patient is working full-time.   Patient has a Scientist, Research (physical Sciences).   Patient is left-handed.   Patient drinks one glass of tea daily, one soda daily.   Social Drivers of Corporate Investment Banker Strain: Not on file  Food Insecurity: No Food Insecurity (07/03/2023)   Hunger Vital Sign    Worried About Running Out of Food in the Last Year: Never true    Ran Out of Food in the Last Year: Never true  Transportation Needs: No Transportation Needs (07/03/2023)   PRAPARE - Administrator, Civil Service (Medical): No    Lack of  Transportation (Non-Medical): No  Physical Activity: Not on file  Stress: Not on file  Social Connections: Moderately Isolated (07/03/2023)   Social Connection and Isolation Panel    Frequency of Communication with Friends and Family: Twice a week    Frequency of Social Gatherings with Friends and Family: Once a week    Attends Religious Services: Never    Database Administrator or Organizations: No    Attends Banker Meetings: Never    Marital Status: Married  Catering Manager Violence: Not At Risk (07/03/2023)   Humiliation, Afraid, Rape, and Kick questionnaire    Fear of Current or Ex-Partner: No    Emotionally Abused: No    Physically Abused: No    Sexually Abused: No     PHYSICAL EXAM  There were no vitals filed for this visit.  There is no height or weight on file to calculate BMI.  Generalized: Well developed, in no acute distress  Cardiology: normal rate and rhythm, no murmur noted Respiratory: clear to auscultation bilaterally  Neurological examination  Mentation: Alert oriented to time, place, history taking. Follows all commands speech and language fluent Cranial nerve II-XII: Pupils were equal round reactive to light. Extraocular movements were full, visual field were full  Motor: The motor testing reveals 5 over 5 strength of all 4 extremities. Good symmetric motor tone is noted throughout.  Gait and station: Gait is normal.  DIAGNOSTIC DATA (LABS, IMAGING, TESTING) - I reviewed patient records, labs, notes, testing and imaging myself where available.     08/29/2023    9:18 AM 08/24/2022   10:04 AM 05/10/2021    3:41 PM 04/15/2020    9:51 AM  Montreal Cognitive Assessment   Visuospatial/ Executive (0/5) 5 5 5 4   Naming (0/3) 3 3 3 3   Attention: Read list of digits (0/2) 2 2 2 2   Attention: Read list of letters (0/1) 1 1 1 1   Attention: Serial 7 subtraction starting at 100 (0/3) 3 1 2 3   Language: Repeat phrase (0/2) 2 2 2 2   Language : Fluency  (0/1) 0 1 1 1   Abstraction (0/2) 2 2 2 2   Delayed Recall (0/5) 2 1 2 2   Orientation (0/6) 6 6 6 6   Total 26 24 26 26         10/18/2020    9:18 AM 04/15/2020    9:42 AM  MMSE - Mini Mental State Exam  Orientation to time 4 5  Orientation to Place 5 5  Registration 3 3  Attention/ Calculation 5 5  Recall 2 3  Language- name 2 objects 2 2  Language- repeat 1 1  Language- follow 3 step command 3 3  Language- read & follow direction 1   Write a sentence 1   Copy design 1   Total score 28      Lab Results  Component Value Date   WBC 8.1 03/28/2024   HGB 13.1 03/28/2024   HCT 40.0 03/28/2024   MCV 84.4 03/28/2024   PLT 201 03/28/2024      Component Value Date/Time   NA 138 03/28/2024 0852   NA 141 06/11/2023 1442   K 4.0 03/28/2024 0852   CL 99 03/28/2024 0852   CO2 27 03/28/2024 0852   GLUCOSE 329 (H) 03/28/2024 0852   BUN 25 (H) 03/28/2024 0852   BUN 58 (H) 06/11/2023 1442   CREATININE 1.58 (H) 03/28/2024 0852   CALCIUM  9.7 03/28/2024 0852   PROT 6.7 03/28/2024 0852   PROT 6.6 10/18/2020 1033   ALBUMIN 4.2 03/28/2024 0852   ALBUMIN 4.0 10/18/2020 1033   AST 24 03/28/2024 0852   ALT 24 03/28/2024 0852   ALKPHOS 49 03/28/2024 0852   BILITOT 0.4 03/28/2024 0852   GFRNONAA 46 (L) 03/28/2024 0852   GFRAA >60 12/27/2018 0653   Lab Results  Component Value Date   CHOL 233 (H) 05/13/2021   HDL 40 05/13/2021   LDLCALC 124 (H) 05/13/2021   TRIG 386 (H) 05/13/2021   CHOLHDL 5.8 (H) 05/13/2021   Lab Results  Component Value Date   HGBA1C 9.0 (H) 07/03/2023   Lab Results  Component Value Date   VITAMINB12 376 01/04/2024   Lab Results  Component Value Date   TSH 1.72 01/08/2024     ASSESSMENT AND PLAN 73 y.o. year old male  has a past medical history of Allergic rhinitis, seasonal, Asthma, Bronchitis, CHF (congestive heart failure) (HCC), Chronic kidney disease, stage I, Cognitive impairment, collar bone, Degenerative joint disease of knee, Diabetes  mellitus, Dysrhythmia, Fatty liver, GERD (gastroesophageal reflux disease), Headache, Heart murmur, History of acute prostatitis, History of hematuria, History of hiatal hernia, Hyperlipidemia, Hypertension, IgA nephropathy, OSA on CPAP, Peripheral vascular disease, Pneumonia, and Sinusitis. here with   No diagnosis found.     BREVAN LUBERTO is doing well on CPAP therapy. Compliance report reveals excellent compliance. He was encouraged to continue using CPAP nightly  and for greater than 4 hours each night. We will update supply orders as indicated. Risks of untreated sleep apnea review and education materials provided. Memory seems stable. MOCA 24/30. Previously 26/30 05/2021. Lost points with serial subtractions and delayed recall. He will continue memantine  10mg  BID. Memory compensation strategies advised. Healthy lifestyle habits encouraged. he will follow up in 1 year (per his request), sooner if needed. He and his wife verbalize understanding and agreement with this plan.    No orders of the defined types were placed in this encounter.    No orders of the defined types were placed in this encounter.     Greig Forbes, FNP-C 04/10/2024, 3:20 PM [facility] [address] [place], [place] [address] [phone]

## 2024-04-14 ENCOUNTER — Encounter: Payer: Self-pay | Admitting: Family Medicine

## 2024-04-14 ENCOUNTER — Ambulatory Visit: Admitting: Family Medicine

## 2024-04-14 ENCOUNTER — Ambulatory Visit

## 2024-04-14 VITALS — BP 132/89 | HR 86 | Ht 68.0 in | Wt 196.0 lb

## 2024-04-14 DIAGNOSIS — G3184 Mild cognitive impairment, so stated: Secondary | ICD-10-CM

## 2024-04-14 DIAGNOSIS — I82452 Acute embolism and thrombosis of left peroneal vein: Secondary | ICD-10-CM | POA: Insufficient documentation

## 2024-04-14 DIAGNOSIS — G4733 Obstructive sleep apnea (adult) (pediatric): Secondary | ICD-10-CM | POA: Diagnosis not present

## 2024-04-14 DIAGNOSIS — M47816 Spondylosis without myelopathy or radiculopathy, lumbar region: Secondary | ICD-10-CM | POA: Insufficient documentation

## 2024-04-14 DIAGNOSIS — K5900 Constipation, unspecified: Secondary | ICD-10-CM | POA: Insufficient documentation

## 2024-04-14 DIAGNOSIS — M5416 Radiculopathy, lumbar region: Secondary | ICD-10-CM | POA: Insufficient documentation

## 2024-04-14 DIAGNOSIS — G4734 Idiopathic sleep related nonobstructive alveolar hypoventilation: Secondary | ICD-10-CM | POA: Diagnosis not present

## 2024-04-14 DIAGNOSIS — R42 Dizziness and giddiness: Secondary | ICD-10-CM | POA: Insufficient documentation

## 2024-04-14 DIAGNOSIS — Z9981 Dependence on supplemental oxygen: Secondary | ICD-10-CM | POA: Diagnosis not present

## 2024-04-14 DIAGNOSIS — N401 Enlarged prostate with lower urinary tract symptoms: Secondary | ICD-10-CM | POA: Insufficient documentation

## 2024-04-14 DIAGNOSIS — R0902 Hypoxemia: Secondary | ICD-10-CM

## 2024-04-14 DIAGNOSIS — M4690 Unspecified inflammatory spondylopathy, site unspecified: Secondary | ICD-10-CM | POA: Insufficient documentation

## 2024-04-14 DIAGNOSIS — I2510 Atherosclerotic heart disease of native coronary artery without angina pectoris: Secondary | ICD-10-CM | POA: Insufficient documentation

## 2024-04-14 DIAGNOSIS — H9190 Unspecified hearing loss, unspecified ear: Secondary | ICD-10-CM | POA: Insufficient documentation

## 2024-04-14 MED ORDER — MEMANTINE HCL 10 MG PO TABS: 10.0000 mg | ORAL_TABLET | Freq: Two times a day (BID) | ORAL | 3 refills | Status: AC

## 2024-04-15 ENCOUNTER — Telehealth: Payer: Self-pay | Admitting: Neurology

## 2024-04-15 NOTE — Telephone Encounter (Signed)
 Patient saw Greig Forbes, NP yesterday and his SS was also schedule but he was notable to do the SS since he can't have an office visit and SS on the same day.    Sent mychart to r/s.

## 2024-04-16 ENCOUNTER — Encounter: Payer: Self-pay | Admitting: Family Medicine

## 2024-04-17 ENCOUNTER — Ambulatory Visit: Payer: Self-pay | Admitting: Family Medicine

## 2024-04-17 LAB — ATN PROFILE
A -- Beta-amyloid 42/40 Ratio: 0.088 — ABNORMAL LOW (ref >0.102)
Beta-amyloid 40: 322.55 pg/mL
Beta-amyloid 42: 28.29 pg/mL
N -- NfL, Plasma: 4.64 pg/mL (ref 0.00–6.04)
T -- p-tau181: 1.12 pg/mL — ABNORMAL HIGH (ref 0.00–0.97)

## 2024-04-17 NOTE — Progress Notes (Unsigned)
 Patient ID: Donald [name], male   DOB: 10-May-1950, 73 y.o.   MRN: [id number]  Reason for Consult: No chief complaint on file.   Referred by Janey Santos, MD  Subjective:     HPI Donald [name] is a 73 y.o. male presenting for follow-up of bilateral popliteal artery aneurysms.  In June 2025 he underwent left popliteal artery stenting.  He was last seen in July and at that time he denied any embolic symptoms and the right popliteal artery aneurysm measured 2.2.  He was having worsening shortness of breath and we agreed that before any repair of the right he would see his cardiologist. Today he reports ***  Past Medical History:  Diagnosis Date   Allergic rhinitis, seasonal    Asthma    Bronchitis    CHF (congestive heart failure) (HCC)    Chronic kidney disease, stage I    Cognitive impairment    Effects Short Term Memory   collar bone    dislocation left side 05/2014   Degenerative joint disease of knee    bilateral   Diabetes mellitus    Type 2   Dysrhythmia    PVCs   Fatty liver    GERD (gastroesophageal reflux disease)    Headache    Heachaces r/t eyes   Heart murmur    history of   History of acute prostatitis    History of hematuria    History of hiatal hernia    H/O   Hyperlipidemia    Hypertension    IgA nephropathy    OSA on CPAP    Peripheral vascular disease    Pneumonia    Sinusitis    Family History  Problem Relation Age of Onset   Coronary artery disease Father    Diabetes type II Father    Heart attack Father    Past Surgical History:  Procedure Laterality Date   ABDOMINAL AORTOGRAM N/A 10/08/2023   Procedure: ABDOMINAL AORTOGRAM;  Surgeon: Pearline Donald RAMAN, MD;  Location: [facility] INVASIVE CV LAB;  Service: Cardiovascular;  Laterality: N/A;   BIOPSY  05/17/2020   Procedure: BIOPSY;  Surgeon: Rosalie Kitchens, MD;  Location: [facility];  Service: Endoscopy;;   BIOPSY OF SKIN SUBCUTANEOUS TISSUE AND/OR MUCOUS MEMBRANE  04/01/2024   Procedure:  BIOPSY, SKIN, SUBCUTANEOUS TISSUE, OR MUCOUS MEMBRANE;  Surgeon: Rosalie Kitchens, MD;  Location: [facility];  Service: Gastroenterology;;   CARDIAC CATHETERIZATION  09/29/2011   normal   CATARACT EXTRACTION Bilateral    CERVICAL SPINE SURGERY     COLONOSCOPY N/A 04/01/2024   Procedure: COLONOSCOPY;  Surgeon: Rosalie Kitchens, MD;  Location: [facility];  Service: Gastroenterology;  Laterality: N/A;   COLONOSCOPY WITH PROPOFOL  N/A 05/17/2020   Procedure: COLONOSCOPY WITH PROPOFOL ;  Surgeon: Rosalie Kitchens, MD;  Location: [facility];  Service: Endoscopy;  Laterality: N/A;   deviated septum     1990s   ESOPHAGOGASTRODUODENOSCOPY N/A 04/01/2024   Procedure: EGD (ESOPHAGOGASTRODUODENOSCOPY);  Surgeon: Rosalie Kitchens, MD;  Location: [facility];  Service: Gastroenterology;  Laterality: N/A;   ESOPHAGOGASTRODUODENOSCOPY (EGD) WITH PROPOFOL  N/A 05/17/2020   Procedure: ESOPHAGOGASTRODUODENOSCOPY (EGD) WITH PROPOFOL ;  Surgeon: Rosalie Kitchens, MD;  Location: [facility];  Service: Endoscopy;  Laterality: N/A;   HEMOSTASIS CLIP PLACEMENT  05/17/2020   Procedure: HEMOSTASIS CLIP PLACEMENT;  Surgeon: Rosalie Kitchens, MD;  Location: [facility];  Service: Endoscopy;;   HERNIA REPAIR     LEFT HEART CATHETERIZATION WITH CORONARY ANGIOGRAM N/A 09/29/2011   Procedure: LEFT HEART CATHETERIZATION WITH CORONARY  ANGIOGRAM;  Surgeon: Jerel Balding, MD;  Location: [facility] C[facility] LAB;  Service: Cardiovascular;  Laterality: N/A;   LOWER EXTREMITY ANGIOGRAPHY Left 10/08/2023   Procedure: Lower Extremity Angiography;  Surgeon: Pearline Donald RAMAN, MD;  Location: [facility] INVASIVE CV LAB;  Service: Cardiovascular;  Laterality: Left;   LOWER EXTREMITY INTERVENTION Left 10/08/2023   Procedure: LOWER EXTREMITY INTERVENTION;  Surgeon: Pearline Donald RAMAN, MD;  Location: [facility] INVASIVE CV LAB;  Service: Cardiovascular;  Laterality: Left;   NM MYOVIEW  LTD  07/08/2011   mild LV dilatation,mild septal hypokinesia   POLYPECTOMY  05/17/2020   Procedure: POLYPECTOMY;   Surgeon: Rosalie Kitchens, MD;  Location: [facility];  Service: Endoscopy;;   POLYPECTOMY  04/01/2024   Procedure: POLYPECTOMY, INTESTINE;  Surgeon: Rosalie Kitchens, MD;  Location: [facility];  Service: Gastroenterology;;   RIGHT HEART CATH N/A 04/17/2023   Procedure: RIGHT HEART CATH;  Surgeon: Anner Alm ORN, MD;  Location: [facility] INVASIVE CV LAB;  Service: Cardiovascular;  Laterality: N/A;   TONSILLECTOMY     US  ECHOCARDIOGRAPHY  09/07/2011   EF 35-45%,mod.ant hypokinesis,boderline LA & RA enlargement,trace MR,TR & PI    Short Social History:  Social History   Tobacco Use   Smoking status: Never   Smokeless tobacco: Never   Tobacco comments:    Second hand smoke for 26 years per pt  Substance Use Topics   Alcohol  use: No    Alcohol /week: 0.0 standard drinks of alcohol     Allergies[1]  Current Outpatient Medications  Medication Sig Dispense Refill   aspirin  EC 81 MG tablet Take 81 mg by mouth daily. Swallow whole.     cetirizine (ZYRTEC) 10 MG tablet Take 10 mg by mouth every evening. (Patient not taking: Reported on 04/14/2024)     clopidogrel  (PLAVIX ) 75 MG tablet Take 1 tablet (75 mg total) by mouth daily.     Continuous Blood Gluc Sensor (FREESTYLE LIBRE 14 DAY SENSOR) MISC Inject 1 Application into the skin every 14 (fourteen) days.     finasteride  (PROSCAR ) 5 MG tablet Take 5 mg by mouth in the morning.     fluticasone  (FLONASE ) 50 MCG/ACT nasal spray Place 1 spray into both nostrils daily as needed for allergies.     furosemide  (LASIX ) 40 MG tablet Take 40 mg by mouth daily. (Patient taking differently: Take 40 mg by mouth as needed for fluid.)     insulin  lispro (HUMALOG  KWIKPEN) 200 UNIT/ML KwikPen Inject 30-130 Units into the skin in the morning, at noon, and at bedtime. Sliding scale If blood sugar is over 140     memantine  (NAMENDA ) 10 MG tablet Take 1 tablet (10 mg total) by mouth 2 (two) times daily. 180 tablet 3   metFORMIN  (GLUCOPHAGE ) 1000 MG tablet Take 1 tablet  (1,000 mg total) by mouth 2 (two) times daily with a meal. Restart on 8/22     metoprolol  succinate (TOPROL -XL) 50 MG 24 hr tablet Take 50 mg by mouth 2 (two) times daily. Take with or immediately following a meal.     OZEMPIC, 1 MG/DOSE, 4 MG/3ML SOPN inject 1mg  Subcutaneous weekly; Duration: 30 days     pantoprazole  (PROTONIX ) 40 MG tablet Take 1 tablet (40 mg total) by mouth 2 (two) times daily. 180 tablet 0   rosuvastatin  (CRESTOR ) 20 MG tablet TAKE ONE TABLET BY MOUTH ONE TIME DAILY (Patient taking differently: Take 20 mg by mouth every evening.) 90 tablet 3   sacubitril -valsartan  (ENTRESTO ) 97-103 MG TAKE ONE TABLET BY MOUTH TWICE DAILY 180 tablet 3  silodosin (RAPAFLO) 8 MG CAPS capsule Take 8 mg by mouth every evening.     spironolactone  (ALDACTONE ) 25 MG tablet Take one-half tablet by mouth daily 45 tablet 3   TRESIBA FLEXTOUCH 200 UNIT/ML FlexTouch Pen Inject 100 Units into the skin in the morning.     No current facility-administered medications for this visit.    REVIEW OF SYSTEMS  All other systems were reviewed and are negative     Objective:  Objective   There were no vitals filed for this visit. There is no height or weight on file to calculate BMI.  Physical Exam General: no acute distress Cardiac: hemodynamically stable Abdomen: non-tender, no pulsatile mass*** Extremities: no edema, cyanosis or wounds*** Vascular:   Right: ***  Left: ***  Data: ABI ***  Duplex ***     Assessment/Plan:   Donald [name] is a 73 y.o. male with bilateral popliteal aneurysms.  He underwent left popliteal artery stenting in June 2025.  He has been doing well and the stents are widely patent on duplex. On the right his popliteal aneurysm is measuring ***on duplex.  We discussed the risk benefits of angiogram with popliteal artery stenting of the right side in order to prevent his risk of thrombosis or embolization given that it is greater than 2cm.  Of note he does not have  an aortic aneurysm in the chest or abdomen.  CT scans reviewed from March and September 2025.  Donald GORMAN Serve MD [facility]     [1]  Allergies Allergen Reactions   Ciprofloxacin     Tendons problem in shoulder Other reaction(s): Arthralgias (intolerance), Myalgias (intolerance)   Jardiance [Empagliflozin] Other (See Comments)    Dehydration, uti.   Levaquin [Levofloxacin]     Other reaction(s): Arthralgias (intolerance), Myalgias (intolerance)   Aricept  [Donepezil  Hcl] Nausea Only   Bactrim [Sulfamethoxazole-Trimethoprim] Hives   Breztri  Aerosphere [Budeson-Glycopyrrol-Formoterol ] Other (See Comments)    Patient and wife are unsure what happened, however they remember he had a bad reaction   Sulfamethoxazole    Trimethoprim    Misc. Sulfonamide Containing Compounds Rash

## 2024-04-18 ENCOUNTER — Ambulatory Visit: Admitting: Vascular Surgery

## 2024-04-18 ENCOUNTER — Telehealth: Payer: Self-pay

## 2024-04-18 ENCOUNTER — Ambulatory Visit (HOSPITAL_COMMUNITY): Admission: RE | Admit: 2024-04-18 | Discharge: 2024-04-18 | Attending: Surgery | Admitting: Surgery

## 2024-04-18 ENCOUNTER — Encounter: Payer: Self-pay | Admitting: Vascular Surgery

## 2024-04-18 VITALS — BP 127/82 | HR 84 | Temp 98.1°F | Resp 18 | Ht 68.0 in | Wt 195.5 lb

## 2024-04-18 DIAGNOSIS — I724 Aneurysm of artery of lower extremity: Secondary | ICD-10-CM | POA: Diagnosis not present

## 2024-04-18 LAB — VAS US ABI WITH/WO TBI
Left ABI: 1.15
Right ABI: 1.27

## 2024-04-18 NOTE — Telephone Encounter (Signed)
 Patient and wife would like a later time of arrival for RLE angio.  Will wait until schedule fills up (2nd week of January).

## 2024-04-24 ENCOUNTER — Encounter: Payer: Self-pay | Admitting: Psychology

## 2024-04-24 ENCOUNTER — Encounter: Attending: Psychology | Admitting: Psychology

## 2024-04-24 DIAGNOSIS — R413 Other amnesia: Secondary | ICD-10-CM | POA: Insufficient documentation

## 2024-04-24 DIAGNOSIS — G3184 Mild cognitive impairment, so stated: Secondary | ICD-10-CM | POA: Insufficient documentation

## 2024-04-24 DIAGNOSIS — J9611 Chronic respiratory failure with hypoxia: Secondary | ICD-10-CM | POA: Insufficient documentation

## 2024-04-24 DIAGNOSIS — G4733 Obstructive sleep apnea (adult) (pediatric): Secondary | ICD-10-CM | POA: Insufficient documentation

## 2024-04-24 DIAGNOSIS — I429 Cardiomyopathy, unspecified: Secondary | ICD-10-CM | POA: Insufficient documentation

## 2024-04-24 DIAGNOSIS — I5020 Unspecified systolic (congestive) heart failure: Secondary | ICD-10-CM | POA: Diagnosis not present

## 2024-04-24 NOTE — Progress Notes (Signed)
 Neuropsychological Consultation   Patient:   Donald [name]   DOB:   08-21-1950  MR Number:  [id number]  Location:  [facility] PHYSICAL MEDICINE AND REHABILITATION [address] [place], [place] [address] [place] [name] [address] Dept: [phone]           Date of Service:   04/24/2024  Location of Service and Individuals present: Today's visit was conducted in my outpatient clinic office with the patient and his wife present.  Start Time:   10 AM End Time:   12 PM  Patient Consent and Confidentiality: Limits of confidentiality were reviewed and occluding the fact that the patient had been referred for follow-up neuropsychological evaluation and formal report will be nominally made available in the patient's electronic medical records for other appropriate medical professionals to have access to but also provided to his referring physician.  Patient consents to proceed with evaluation.  Consent for Evaluation and Treatment:  Signed:  Yes Explanation of Privacy Policies:  Signed:  Yes Discussion of Confidentiality Limits:  Yes  Provider/Observer:  Norleen Asa, Psy.D.       Clinical Neuropsychologist       Billing Code/Service: [phone]  Chief Complaint:     Chief Complaint  Patient presents with   Memory Loss   Agitation    Reason for Service:    Donald [name] presents for a follow-up neuropsychological evaluation as he is continuing to be followed by his neurology group and in particular, Amy Lomax, PA-C, for repeat testing. The referral is due to the patient and his wife reporting more difficulties with memory and other cognitive functioning.  The patient has also had recent blood work (ATN blood work) indicating markers associated with potential for development of Alzheimer's disease. The patient and his wife report worsening memory, including difficulty remembering plays in a computer game, and increased distractibility. He  has a history of pulmonary emboli, which began around Thanksgiving of last year, leading to significant fatigue and deconditioning. He also has a history of obstructive sleep apnea, for which he uses a CPAP machine with oxygen . He has a history of anemia, which has been treated with iron infusions. He is currently on Plavix  and aspirin . He has a history of back surgery in May 2024. He reports a change in his gait, taking smaller steps, which he attributes to orthopedic issues rather than fatigue. He has a history of aneurysms in his legs, one of which has been stented.  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  Onset and Duration of Symptoms:  The patient experienced a significant change in his life starting around Thanksgiving of last year when he developed a blood clot in his lung. This led to severe shortness of breath and fatigue, preventing him from his usual activities, including going to the gym. He was diagnosed with anemia over the summer and received iron infusions. He reports a recent increase in memory difficulties, such as forgetting plays and functional details in a computer game that he has been playing for quite some time.  Progression of Symptoms:  The patient reports that his symptoms have worsened over the past year. His wife also notes increased memory loss and distractibility. He has become more open about his memory difficulties. He attempted to return to the  gym but was unable to recover between sessions and had to stop. He reports that his gait has changed, with smaller steps, which he believes is related to pain from a previous back surgery.  Neuroimaging Results:  MRI results from 2021 and 2022 showed some degree of microvascular  ischemic changes and general atrophy consistent with age.    Laboratory Tests:  Recent blood work (ATN profile 04/14/2024) showed a low A?42/40 ratio (0.088) and an elevated P-tau 181 level (1.21), which are markers associated with Alzheimer's disease.    A -- Beta-amyloid 42/40 Ratio 0.088 Low   Beta-amyloid 42 28.29  Beta-amyloid 40 322.55  T -- p-tau181 1.12 High   N -- NfL, Plasma 4.64   Sleep:  The patient uses a CPAP machine with oxygen  for obstructive sleep apnea. He reports that his oxygen  levels still drop at night without it. He has a new sleep study scheduled. He has had issues with the CPAP, including air blowing into his sinuses and mouth, which is a new problem since he lost 15 pounds.  Medical History:   Past Medical History:  Diagnosis Date   Allergic rhinitis, seasonal    Asthma    Bronchitis    CHF (congestive heart failure) (HCC)    Chronic kidney disease, stage I    Cognitive impairment    Effects Short Term Memory   collar bone    dislocation left side 05/2014   Degenerative joint disease of knee    bilateral   Diabetes mellitus    Type 2   Dysrhythmia    PVCs   Fatty liver    GERD (gastroesophageal reflux disease)    Headache    Heachaces r/t eyes   Heart murmur    history of   History of acute prostatitis    History of hematuria    History of hiatal hernia    H/O   Hyperlipidemia    Hypertension    IgA nephropathy    OSA on CPAP    Peripheral vascular disease    Pneumonia    Sinusitis          Patient Active Problem List   Diagnosis Date Noted   Acute constipation 04/14/2024   Acute deep vein thrombosis (DVT) of left peroneal vein (HCC) 04/14/2024   Dizzy 04/14/2024   Hearing loss 04/14/2024   Lower urinary tract symptoms due to benign prostatic hyperplasia 04/14/2024   Lumbar radiculopathy 04/14/2024   Lumbar spondylosis 04/14/2024   Multiple vessel coronary artery disease 04/14/2024   Spondylitis 04/14/2024   Aerophagia 02/13/2024    Dysphonia 12/20/2023   Irritable larynx 12/20/2023   Vocal fold atrophy 12/20/2023   Chronic respiratory failure with hypoxia (HCC) 11/13/2023   Acute on chronic renal insufficiency 11/13/2023   Acute dysfunction of Eustachian tube, left 10/15/2023   Mixed conductive and sensorineural hearing loss of left ear with restricted hearing of right ear 10/15/2023   Microcytic anemia 08/17/2023   Popliteal artery aneurysm 08/17/2023   IgA nephropathy 08/08/2023   S/P colonoscopy with polypectomy 08/08/2023   Hx of Bilateral pulmonary embolism (HCC) 07/03/2023   History of deep venous thrombosis (DVT) of distal vein of left lower extremity 07/03/2023   S/P lumbar fusion 09/29/2022   Nocturnal oxygen  desaturation 02/21/2022   Hypoxemia associated with sleep 02/21/2022   Aphasia determined by examination 02/21/2022   History of colonic polyps 07/08/2021   Diverticular disease of colon 07/08/2021   Benign neoplasm of stomach 07/08/2021   Benign gastric polyp  07/08/2021   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) 05/10/2021   Hypersomnia with sleep apnea 05/10/2021   MCI (mild cognitive impairment) 05/10/2021   Chronic heart failure with mildly reduced ejection fraction (HFmrEF, 41-49%) (HCC) 03/30/2021   Memory loss 07/17/2020   UTI (urinary tract infection) 12/25/2018   Recurrent UTI 12/25/2018   Long term (current) use of insulin  (HCC) 11/28/2018   CKD stage 3a, GFR 45-59 ml/min (HCC) 10/03/2018   Chronic pain syndrome 03/14/2016   Affective psychosis 07/19/2015   Encounter for general adult medical examination without abnormal findings 07/07/2015   DOE (dyspnea on exertion) 07/05/2014   Chronic cough 06/11/2014   Chronic sinusitis with recurrent bronchitis 06/11/2014   Hypoxemia requiring supplemental oxygen  06/04/2014   OSA on CPAP 06/04/2014   Pneumonitis 06/04/2014   Mild intermittent asthma without complication 05/25/2014   Hypoxemia 06/13/2013    Pneumonia, interstitial (HCC) 06/13/2013   OSA (obstructive sleep apnea) 11/14/2011   Nocturnal hypoxemia 08/18/2011   SOB (shortness of breath) 07/06/2011   DM2 (diabetes mellitus, type 2) (HCC) 07/06/2011   Primary hypertension 07/06/2011   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   Uncontrolled type 2 diabetes mellitus with hyperglycemia (HCC) 07/06/2011   Gastro-esophageal reflux disease without esophagitis 04/21/2009   Mixed hyperlipidemia 04/21/2009   Primary osteoarthritis involving multiple joints 04/21/2009   Seasonal allergic rhinitis due to pollen 04/21/2009     Behavioral Observation/Mental Status:   JAXTON CASALE  was well-groomed and appropriately dressed. He was polite and cooperative. He demonstrated a positive attitude and good effort. His speech was clear and coherent. He was oriented to person, place, and time. His thought process was logical and organized. He was able to provide a detailed and accurate history of his recent medical events. He reported some increased anger and frustration, which he feels is just under the surface. His wife reports more anxiety and depressive symptoms. He denies any visual or auditory hallucinations or delusions.  Marital Status/Living:  The patient is married and lives with his wife.  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 Hobbies and Interests: He previously enjoyed going to the gym and working with a systems analyst. He now plays a buyer, retail.   Family Med/Psych History:  Family History  Problem Relation Age of Onset   Coronary artery disease Father    Diabetes type II Father    Heart attack Father     Impression/DX:   This is a follow-up neuropsychological evaluation for a patient with a history of mild neurocognitive disorder. He was referred due to recent blood work suggesting markers for Alzheimer's  disease and a reported worsening of memory and attention. His recent medical history is significant for pulmonary emboli, anemia, and obstructive sleep apnea. The current cognitive complaints are likely multifactorial, related to his significant medical history, including the physiological strain from pulmonary emboli and ongoing issues with hypoxemia. The previous neuropsychological testing did not show a pattern consistent with a primary degenerative process like Alzheimer's disease. The current symptoms are more likely reflective of a worsening physiological status rather than a new or progressive neurodegenerative condition.  Disposition/Plan:  The plan is to repeat neuropsychological testing to assess for any changes in cognitive functioning. This will involve a four-hour testing session with the psychometrist, Annalise, followed by two follow-up appointments with the neuropsychologist to review the results. The results will be communicated via a  formal report, which will be made available in the EMR for the referring provider and neurology team. The patient was advised to continue managing his underlying health conditions, including his sleep apnea, and to gradually return to physical activity at the gym, being mindful not to overexert himself. He was encouraged to continue using his CPAP machine as prescribed.  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   Electronically Signed   _______________________ Norleen Asa, Psy.D. Clinical Neuropsychologist

## 2024-05-07 ENCOUNTER — Encounter

## 2024-05-07 DIAGNOSIS — R413 Other amnesia: Secondary | ICD-10-CM

## 2024-05-07 DIAGNOSIS — J9611 Chronic respiratory failure with hypoxia: Secondary | ICD-10-CM

## 2024-05-07 DIAGNOSIS — I429 Cardiomyopathy, unspecified: Secondary | ICD-10-CM

## 2024-05-07 DIAGNOSIS — G3184 Mild cognitive impairment, so stated: Secondary | ICD-10-CM

## 2024-05-07 DIAGNOSIS — G4733 Obstructive sleep apnea (adult) (pediatric): Secondary | ICD-10-CM

## 2024-05-07 NOTE — Progress Notes (Signed)
 "  Behavioral Observations: The patient appeared well-groomed and appropriately dressed. His manners were polite and appropriate to the situation. The patient occasionally made comments criticizing his performance on the test and reported some difficulties with attention and short term memory throughout the testing session. His overall pace on testing was slow and he took more time on visual subtests than on verbal subtests. He appeared to become somewhat fatigued around the beginning of the WMS-IV. The patient's overall attitude towards testing was positive and he demonstrated a good effort.   Neuropsychology Note  Donald [name] completed 180 minutes of neuropsychological testing with technician, Josue Ned, BA, under the supervision of Norleen Asa, PsyD., Clinical Neuropsychologist. The patient did not appear overtly distressed by the testing session, per behavioral observation or via self-report to the technician. Rest breaks were offered.   Clinical Decision Making: In considering the patient's current level of functioning, level of presumed impairment, nature of symptoms, emotional and behavioral responses during clinical interview, level of literacy, and observed level of motivation/effort, a battery of tests was selected by Dr. Asa during initial consultation on 04/24/2024. This was communicated to the technician. Communication between the neuropsychologist and technician was ongoing throughout the testing session and changes were made as deemed necessary based on patient performance on testing, technician observations and additional pertinent factors such as those listed above.  Tests Administered: Controlled Oral Word Association Test (COWAT; FAS & Animals)  Trail Making Test (TMT; Part A & B) Wechsler Adult Intelligence Scale, 4th Edition (WAIS-IV) Wechsler Memory Scale-Third Edition (WMS-III), select subtest Wechsler Memory Scale, 4th Edition (WMS-IV); Older Adult  Battery   Results:  *See note from 05/22/2024 for results from second testing session  COWAT:  FAS total= 41 Z= 0.50 Animals total= 25 Z= 2.23  TMT:  Trails A time= 55s Errors= 0 Percentile Rank= 40th Trails B time= 119s Errors= 0  Percentile Rank= 45th  WAIS-IV:   Composite Score Summary  Scale Sum of Scaled Scores Composite Score Percentile Rank 95% Conf. Interval Qualitative Description  Verbal Comprehension 44 VCI 127 96 120-132 Superior  Perceptual Reasoning 41 PRI 121 92 114-126 Superior  Working Memory 23 WMI 108 70 101-114 Average  Processing Speed 16 PSI 89 23 82-98 Low Average  Full Scale 124 FSIQ 116 86 112-120 High Average  General Ability 85 GAI 127 96 121-131 Superior   Verbal Comprehension Subtests Summary  Subtest Raw Score Scaled Score Percentile Rank Reference Group Scaled Score SEM  Similarities 32 15 95 14 0.95  Vocabulary 48 13 84 14 0.67  Information 24 16 98 17 0.73  The scaled scores in the Reference Group Scaled Score column are based on the performance of examinees aged 20:0-34:11 (i.e., the reference group). See Chapter 6 of the WAIS-IV Technical and Interpretive Manual for more information.  Perceptual Reasoning Subtests Summary  Subtest Raw Score Scaled Score Percentile Rank Reference Group Scaled Score SEM  Block Design 40 13 84 9 0.99  Matrix Reasoning 20 15 95 11 0.90  Visual Puzzles 14 13 84 9 0.99   Working Librarian, Academic Raw Score Scaled Score Percentile Rank Reference Group Scaled Score SEM  Digit Span 32 14 91 12 0.73  Arithmetic 12 9 37 9 1.20   Processing Speed Subtests Summary  Subtest Raw Score Scaled Score Percentile Rank Reference Group Scaled Score SEM  Symbol Search 18 8 25 5  1.12  Coding 40 8 25 5  1.12    WMS-IV:   Index Score  Summary  Index Sum of Scaled Scores Index Score Percentile Rank 95% Confidence Interval Qualitative Descriptor  Auditory Memory (AMI) 36 94 34 88-101 Average  Visual  Memory (VMI) 19 98 45 93-103 Average  Immediate Memory (IMI) 30 100 50 94-106 Average  Delayed Memory (DMI) 25 89 23 82-98 Low Average    Primary Subtest Scaled Score Summary  Subtest Domain Raw Score Scaled Score Percentile Rank  Logical Memory I AM 29 9 37  Logical Memory II AM 11 7 16   Verbal Paired Associates I AM 20 10 50  Verbal Paired Associates II AM 6 10 50  Visual Reproduction I VM 33 11 63  Visual Reproduction II VM 11 8 25   Symbol Span VWM 22 12 75   PROCESS SCORE CONVERSIONS  Auditory Memory Process Score Summary  Process Score Raw Score Scaled Score Percentile Rank Cumulative Percentage (Base Rate)  LM II Recognition 20 - - >75%  VPA II Recognition 27 - - 26-50%   Visual Memory Process Score Summary  Process Score Raw Score Scaled Score Percentile Rank Cumulative Percentage (Base Rate)  VR II Recognition 6 - - >75%    ABILITY-MEMORY ANALYSIS  Ability Score:  VCI: 127 Date of Testing:  WAIS-IV; WMS-IV 2024/05/07  Predicted Difference Method   Index Predicted WMS-IV Index Score Actual WMS-IV Index Score Difference Critical Value  Significant Difference Y/N Base Rate  Auditory Memory 114 94 20 10.41 Y 5-10%  Visual Memory 112 98 14 7.35 Y 15-20%  Immediate Memory 116 100 16 9.69 Y 10%  Delayed Memory 114 89 25 12.22 Y 3%  Statistical significance (critical value) at the .01 level.    WMS-III Spatial Span:   WMS-III           Spatial Span  10 ss = 7 16 %ile Low Average   SSF  5 ss = 7 16 %ile Low Average   Span:     5      SSB  5 ss = 9 37 %ile Average   Span:     5       Feedback to Patient: Donald [name] will return on 08/20/2024 for an interactive feedback session with Dr. Corina at which time his test performances, clinical impressions and treatment recommendations will be reviewed in detail. The patient understands he can contact our office should he require our assistance before this time.  180 minutes spent face-to-face with  patient administering standardized tests, 60 minutes spent scoring radiographer, therapeutic). [CPT [id number], [id number]]  Full report to follow. "

## 2024-05-13 ENCOUNTER — Other Ambulatory Visit: Payer: Self-pay

## 2024-05-13 DIAGNOSIS — I724 Aneurysm of artery of lower extremity: Secondary | ICD-10-CM

## 2024-05-20 ENCOUNTER — Ambulatory Visit: Admitting: Neurology

## 2024-05-20 DIAGNOSIS — R0602 Shortness of breath: Secondary | ICD-10-CM

## 2024-05-20 DIAGNOSIS — I429 Cardiomyopathy, unspecified: Secondary | ICD-10-CM

## 2024-05-20 DIAGNOSIS — R0609 Other forms of dyspnea: Secondary | ICD-10-CM

## 2024-05-20 DIAGNOSIS — I2699 Other pulmonary embolism without acute cor pulmonale: Secondary | ICD-10-CM

## 2024-05-20 DIAGNOSIS — F458 Other somatoform disorders: Secondary | ICD-10-CM

## 2024-05-20 DIAGNOSIS — G4736 Sleep related hypoventilation in conditions classified elsewhere: Secondary | ICD-10-CM

## 2024-05-20 DIAGNOSIS — G4733 Obstructive sleep apnea (adult) (pediatric): Secondary | ICD-10-CM

## 2024-05-20 DIAGNOSIS — I2510 Atherosclerotic heart disease of native coronary artery without angina pectoris: Secondary | ICD-10-CM

## 2024-05-20 DIAGNOSIS — G3184 Mild cognitive impairment, so stated: Secondary | ICD-10-CM

## 2024-05-20 DIAGNOSIS — G4734 Idiopathic sleep related nonobstructive alveolar hypoventilation: Secondary | ICD-10-CM

## 2024-05-22 ENCOUNTER — Encounter: Attending: Psychology

## 2024-05-22 DIAGNOSIS — I5020 Unspecified systolic (congestive) heart failure: Secondary | ICD-10-CM | POA: Insufficient documentation

## 2024-05-22 DIAGNOSIS — G3184 Mild cognitive impairment, so stated: Secondary | ICD-10-CM | POA: Insufficient documentation

## 2024-05-22 DIAGNOSIS — I429 Cardiomyopathy, unspecified: Secondary | ICD-10-CM | POA: Insufficient documentation

## 2024-05-22 DIAGNOSIS — G4733 Obstructive sleep apnea (adult) (pediatric): Secondary | ICD-10-CM | POA: Insufficient documentation

## 2024-05-22 DIAGNOSIS — R413 Other amnesia: Secondary | ICD-10-CM | POA: Insufficient documentation

## 2024-05-22 DIAGNOSIS — J9611 Chronic respiratory failure with hypoxia: Secondary | ICD-10-CM | POA: Insufficient documentation

## 2024-05-22 NOTE — Progress Notes (Signed)
 "  Behavioral Observations:  The patient appeared well-groomed and appropriately dressed. His manners were polite and appropriate to the situation. The patient's attitude towards testing was positive and he demonstrated a good effort. The patient was slightly talkative throughout the testing session. Some difficulties with hearing were noted and he required occasional repetition throughout the test. He appeared to become somewhat fatigued towards the end of the testing session.  Neuropsychology Note  Donald [name] completed 75 minutes of neuropsychological testing with technician, Josue Ned, BA, under the supervision of Norleen Asa, PsyD., Clinical Neuropsychologist. The patient did not appear overtly distressed by the testing session, per behavioral observation or via self-report to the technician. Rest breaks were offered.   Clinical Decision Making: In considering the patient's current level of functioning, level of presumed impairment, nature of symptoms, emotional and behavioral responses during clinical interview, level of literacy, and observed level of motivation/effort, a battery of tests was selected by Dr. Asa during initial consultation on 04/24/2024. This was communicated to the technician. Communication between the neuropsychologist and technician was ongoing throughout the testing session and changes were made as deemed necessary based on patient performance on testing, technician observations and additional pertinent factors such as those listed above.  Tests Administered: Automatic Data Edition (BNT-2) Delis Debrah Executive Functioning System (D-KEFS), Select subtest Finger Tapping Test (FTT) Grooved Pegboard Hand Dynamometer  [place]  Card Sorting Test [phone]) Wechsler Adult Intelligence Scale, 4th Edition (WAIS-IV); Select subtests  Results:  [place] Test:   Raw   Norm Score Percentile  Range  [place] Naming Test (BNT-2)  57 t = 57  75 %ile High Average    DKEFS Color Word: DKEFS - Color-Word Interference           Color Naming  38 ss = 8 25 %ile Average   Word Reading  25 ss = 10 50 %ile Average   Inhibition  83 ss = 8 25 %ile Average   Errors  4 ss = 9 37 %ile Average   Inhibition Switching  83 ss = 9 37 %ile Average   Errors  3 ss = 10 50 %ile Average   FTT: L (DH) Average= 46.2 Percentile Rank= 44th R (NDH) Average=36.9 Percentile Rank= 37th  Grooved Pegboard: L (DH) time= 99s Drops=0  Percentile Rank= 43rd R (NDH) time= 110s Drops=1  Percentile Rank= 45th  Hand Dynamometer:  L (DH)= 24 Percentile Rank= 27th R (NDH)= 21 Percentile Rank= 29th  WAIS-IV Additional subtests:  **Used norms for 65-69y/o  WAIS-IV:           Cancellation  16 ss = 3 1 %ile Exceptionally Low   Figure Weights  12 ss = 10 50 %ile Average   Letter Number Sequencing  15 ss = 7 16 %ile Low Average    WCST-128  Age & Education Demographically Corrected  WCST scores Raw scores Standard scores T scores %iles  Trials Administered Total Correct 70 63           Total Errors % Errors 7 10% > 145 > 145 > 80 > 80 > 99% > 99%        Perseverative Responses % Perseverative Responses 4 6% 144 133 79 72 > 99% 99%        Perseverative Errors % Perseverative Errors 4 6% > 145 136 > 80 74 > 99% 99%        Nonperseverative Errors % Nonperseverative Errors 3 4% > 145 > 145 > 80 > 80 >  99% > 99%        Conceptual Level Responses % Conceptual Level Responses 63 90%       > 145 > 80 > 99%        Categories Completed Trials to Complete 1st Category Failure to Maintain Set Learning to Learn 6 11 0 0.15   > 16% > 16% > 16% > 16%    Feedback to Patient: Donald [name] will return on 08/20/2024 for an interactive feedback session with Dr. Corina at which time his test performances, clinical impressions and treatment recommendations will be reviewed in detail. The patient understands he can  contact our office should he require our assistance before this time.  75 minutes spent face-to-face with patient administering standardized tests, 45 minutes spent scoring radiographer, therapeutic). [CPT [id number], [id number]]  Full report to follow. "

## 2024-05-26 ENCOUNTER — Other Ambulatory Visit: Payer: Self-pay

## 2024-05-26 ENCOUNTER — Encounter (HOSPITAL_COMMUNITY)
Admission: RE | Disposition: A | Discharge status: Home / Self Care | Payer: Self-pay | Source: Home / Self Care | Attending: Vascular Surgery

## 2024-05-26 ENCOUNTER — Ambulatory Visit (HOSPITAL_COMMUNITY)
Admission: RE | Admit: 2024-05-26 | Discharge: 2024-05-26 | Disposition: A | Discharge status: Home / Self Care | Attending: Vascular Surgery | Admitting: Vascular Surgery

## 2024-05-26 DIAGNOSIS — Z7982 Long term (current) use of aspirin: Secondary | ICD-10-CM | POA: Diagnosis not present

## 2024-05-26 DIAGNOSIS — Z7902 Long term (current) use of antithrombotics/antiplatelets: Secondary | ICD-10-CM | POA: Insufficient documentation

## 2024-05-26 DIAGNOSIS — I724 Aneurysm of artery of lower extremity: Secondary | ICD-10-CM | POA: Diagnosis present

## 2024-05-26 HISTORY — PX: ABDOMINAL AORTOGRAM W/LOWER EXTREMITY: CATH118223

## 2024-05-26 HISTORY — PX: LOWER EXTREMITY INTERVENTION: CATH118252

## 2024-05-26 LAB — POCT I-STAT, CHEM 8
BUN: 27 mg/dL — ABNORMAL HIGH (ref 8–23)
BUN: 40 mg/dL — ABNORMAL HIGH (ref 8–23)
Calcium, Ion: 1.21 mmol/L (ref 1.15–1.40)
Calcium, Ion: 1.28 mmol/L (ref 1.15–1.40)
Chloride: 102 mmol/L (ref 98–111)
Chloride: 103 mmol/L (ref 98–111)
Creatinine, Ser: 1.3 mg/dL — ABNORMAL HIGH (ref 0.61–1.24)
Creatinine, Ser: 1.5 mg/dL — ABNORMAL HIGH (ref 0.61–1.24)
Glucose, Bld: 107 mg/dL — ABNORMAL HIGH (ref 70–99)
Glucose, Bld: 113 mg/dL — ABNORMAL HIGH (ref 70–99)
HCT: 35 % — ABNORMAL LOW (ref 39.0–52.0)
HCT: 41 % (ref 39.0–52.0)
Hemoglobin: 11.9 g/dL — ABNORMAL LOW (ref 13.0–17.0)
Hemoglobin: 13.9 g/dL (ref 13.0–17.0)
Potassium: 4.1 mmol/L (ref 3.5–5.1)
Potassium: 8.2 mmol/L (ref 3.5–5.1)
Sodium: 137 mmol/L (ref 135–145)
Sodium: 141 mmol/L (ref 135–145)
TCO2: 26 mmol/L (ref 22–32)
TCO2: 32 mmol/L (ref 22–32)

## 2024-05-26 LAB — GLUCOSE, CAPILLARY: Glucose-Capillary: 122 mg/dL — ABNORMAL HIGH (ref 70–99)

## 2024-05-26 MED ORDER — HYDRALAZINE HCL 20 MG/ML IJ SOLN: 5.0000 mg | INTRAMUSCULAR | Status: DC | PRN

## 2024-05-26 MED ORDER — HEPARIN SODIUM (PORCINE) 1000 UNIT/ML IJ SOLN
INTRAMUSCULAR | Status: DC | PRN
  Administered 2024-05-26: 8000 [IU] via INTRAVENOUS

## 2024-05-26 MED ORDER — HEPARIN SODIUM (PORCINE) 1000 UNIT/ML IJ SOLN
INTRAMUSCULAR | Status: AC
  Filled 2024-05-26: qty 10

## 2024-05-26 MED ORDER — PROTAMINE SULFATE 10 MG/ML IV SOLN
INTRAVENOUS | Status: DC | PRN
  Administered 2024-05-26: 50 mg via INTRAVENOUS

## 2024-05-26 MED ORDER — ONDANSETRON HCL 4 MG/2ML IJ SOLN: 4.0000 mg | Freq: Four times a day (QID) | INTRAMUSCULAR | Status: DC | PRN

## 2024-05-26 MED ORDER — SODIUM CHLORIDE 0.9% FLUSH: 3.0000 mL | Freq: Two times a day (BID) | INTRAVENOUS | Status: DC

## 2024-05-26 MED ORDER — SODIUM CHLORIDE 0.9% FLUSH: 3.0000 mL | INTRAVENOUS | Status: DC | PRN

## 2024-05-26 MED ORDER — ACETAMINOPHEN 325 MG PO TABS: 650.0000 mg | ORAL_TABLET | ORAL | Status: DC | PRN

## 2024-05-26 MED ORDER — LIDOCAINE HCL (PF) 1 % IJ SOLN
INTRAMUSCULAR | Status: AC
  Filled 2024-05-26: qty 30

## 2024-05-26 MED ORDER — SODIUM CHLORIDE 0.9 % WEIGHT BASED INFUSION: 1.0000 mL/kg/h | INTRAVENOUS | Status: DC

## 2024-05-26 MED ORDER — OXYCODONE HCL 5 MG PO TABS: 5.0000 mg | ORAL_TABLET | ORAL | Status: DC | PRN

## 2024-05-26 MED ORDER — SODIUM CHLORIDE 0.9 % IV SOLN: INTRAVENOUS | Status: DC

## 2024-05-26 MED ORDER — SODIUM CHLORIDE 0.9 % IV SOLN: 250.0000 mL | INTRAVENOUS | Status: DC | PRN

## 2024-05-26 MED ORDER — LIDOCAINE HCL (PF) 1 % IJ SOLN
INTRAMUSCULAR | Status: DC | PRN
  Administered 2024-05-26: 15 mL

## 2024-05-26 MED ORDER — IODIXANOL 320 MG/ML IV SOLN
INTRAVENOUS | Status: DC | PRN
  Administered 2024-05-26: 55 mL

## 2024-05-26 MED ORDER — HEPARIN (PORCINE) IN NACL 1000-0.9 UT/500ML-% IV SOLN
INTRAVENOUS | Status: DC | PRN
  Administered 2024-05-26: 1000 mL

## 2024-05-26 MED ORDER — PROTAMINE SULFATE 10 MG/ML IV SOLN
INTRAVENOUS | Status: AC
  Filled 2024-05-26: qty 5

## 2024-05-26 MED ORDER — LABETALOL HCL 5 MG/ML IV SOLN: 10.0000 mg | INTRAVENOUS | Status: DC | PRN

## 2024-05-26 NOTE — Progress Notes (Signed)
 Patient voided in urinal while on the stretcher 450 cc of yellow urine. Patient ambulated in the hallway and to the bathroom. No bleeding or hematoma noted to left groin site. Dressing clean, dry, and intact. No complaints of pain from the patient.

## 2024-05-26 NOTE — Discharge Instructions (Signed)
 Femoral Site Care This sheet gives you information about how to care for yourself after your procedure. Your health care provider may also give you more specific instructions. If you have problems or questions, contact your health care provider. What can I expect after the procedure?  After the procedure, it is common to have: Bruising that usually fades within 1-2 weeks. Tenderness at the site. Follow these instructions at home: Wound care Follow instructions from your health care provider about how to take care of your insertion site. Make sure you: Wash your hands with soap and water  before you change your bandage (dressing). If soap and water  are not available, use hand sanitizer. Remove your dressing as told by your health care provider. 24 hours Do not take baths, swim, or use a hot tub until your health care provider approves. You may shower 24-48 hours after the procedure or as told by your health care provider. Gently wash the site with plain soap and water . Pat the area dry with a clean towel. Do not rub the site. This may cause bleeding. Do not apply powder or lotion to the site. Keep the site clean and dry. Check your femoral site every day for signs of infection. Check for: Redness, swelling, or pain. Fluid or blood. Warmth. Pus or a bad smell. Activity For the first 2-3 days after your procedure, or as long as directed: Avoid climbing stairs as much as possible. Do not squat. Do not lift anything that is heavier than 10 lb (4.5 kg), or the limit that you are told, until your health care provider says that it is safe. For 5 days Rest as directed. Avoid sitting for a long time without moving. Get up to take short walks every 1-2 hours. Do not drive for 24 hours if you were given a medicine to help you relax (sedative). General instructions Take over-the-counter and prescription medicines only as told by your health care provider. Keep all follow-up visits as told by your  health care provider. This is important. Contact a health care provider if you have: A fever or chills. You have redness, swelling, or pain around your insertion site. Get help right away if: The catheter insertion area swells very fast. You pass out. You suddenly start to sweat or your skin gets clammy. The catheter insertion area is bleeding, and the bleeding does not stop when you hold steady pressure on the area. The area near or just beyond the catheter insertion site becomes pale, cool, tingly, or numb. These symptoms may represent a serious problem that is an emergency. Do not wait to see if the symptoms will go away. Get medical help right away. Call your local emergency services (911 in the [place] Do not drive yourself to the hospital. Summary After the procedure, it is common to have bruising that usually fades within 1-2 weeks. Check your femoral site every day for signs of infection. Do not lift anything that is heavier than 10 lb (4.5 kg), or the limit that you are told, until your health care provider says that it is safe. This information is not intended to replace advice given to you by your health care provider. Make sure you discuss any questions you have with your health care provider. Document Revised: 05/07/2017 Document Reviewed: 05/07/2017 Elsevier Patient Education  2020 [employer].

## 2024-05-26 NOTE — H&P (Signed)
 Patient seen and examined.  No complaints. No changes to medication history or physical exam since last seen. After discussing the risks and benefits of angiogram with popliteal artery stenting, Donald [name] elected to proceed.   Norman GORMAN Serve MD       Patient ID: Donald [name], male   DOB: 03/24/1951, 74 y.o.   MRN: [id number]   Reason for Consult: Follow-up   Referred by Janey Santos, MD   Subjective:    Subjective HPI Donald [name] is a 74 y.o. male presenting for follow-up of bilateral popliteal artery aneurysms.  In June 2025 he underwent left popliteal artery stenting.  He was last seen in July and at that time he denied any embolic symptoms and the right popliteal artery aneurysm measured 2.2.  He was having worsening shortness of breath and we agreed that before any repair of the right he would see his cardiologist. Today he reports he is doing much better in terms of his breathing and energy.  He denies any lower extremity symptoms.       Past Medical History:  Diagnosis Date   Allergic rhinitis, seasonal     Asthma     Bronchitis     CHF (congestive heart failure) (HCC)     Chronic kidney disease, stage I     Cognitive impairment      Effects Short Term Memory   collar bone      dislocation left side 05/2014   Degenerative joint disease of knee      bilateral   Diabetes mellitus      Type 2   Dysrhythmia      PVCs   Fatty liver     GERD (gastroesophageal reflux disease)     Headache      Heachaces r/t eyes   Heart murmur      history of   History of acute prostatitis     History of hematuria     History of hiatal hernia      H/O   Hyperlipidemia     Hypertension     IgA nephropathy     OSA on CPAP     Peripheral vascular disease     Pneumonia     Sinusitis               Family History  Problem Relation Age of Onset   Coronary artery disease Father     Diabetes type II Father     Heart attack Father               Past  Surgical History:  Procedure Laterality Date   ABDOMINAL AORTOGRAM N/A 10/08/2023    Procedure: ABDOMINAL AORTOGRAM;  Surgeon: Serve Norman GORMAN, MD;  Location: [facility] INVASIVE CV LAB;  Service: Cardiovascular;  Laterality: N/A;   BIOPSY   05/17/2020    Procedure: BIOPSY;  Surgeon: Rosalie Kitchens, MD;  Location: [facility];  Service: Endoscopy;;   BIOPSY OF SKIN SUBCUTANEOUS TISSUE AND/OR MUCOUS MEMBRANE   04/01/2024    Procedure: BIOPSY, SKIN, SUBCUTANEOUS TISSUE, OR MUCOUS MEMBRANE;  Surgeon: Rosalie Kitchens, MD;  Location: [facility];  Service: Gastroenterology;;   CARDIAC CATHETERIZATION   09/29/2011    normal   CATARACT EXTRACTION Bilateral     CERVICAL SPINE SURGERY       COLONOSCOPY N/A 04/01/2024    Procedure: COLONOSCOPY;  Surgeon: Rosalie Kitchens, MD;  Location: [facility];  Service: Gastroenterology;  Laterality: N/A;   COLONOSCOPY WITH PROPOFOL  N/A 05/17/2020  Procedure: COLONOSCOPY WITH PROPOFOL ;  Surgeon: Rosalie Kitchens, MD;  Location: [facility];  Service: Endoscopy;  Laterality: N/A;   deviated septum        1990s   ESOPHAGOGASTRODUODENOSCOPY N/A 04/01/2024    Procedure: EGD (ESOPHAGOGASTRODUODENOSCOPY);  Surgeon: Rosalie Kitchens, MD;  Location: [facility];  Service: Gastroenterology;  Laterality: N/A;   ESOPHAGOGASTRODUODENOSCOPY (EGD) WITH PROPOFOL  N/A 05/17/2020    Procedure: ESOPHAGOGASTRODUODENOSCOPY (EGD) WITH PROPOFOL ;  Surgeon: Rosalie Kitchens, MD;  Location: [facility];  Service: Endoscopy;  Laterality: N/A;   HEMOSTASIS CLIP PLACEMENT   05/17/2020    Procedure: HEMOSTASIS CLIP PLACEMENT;  Surgeon: Rosalie Kitchens, MD;  Location: [facility];  Service: Endoscopy;;   HERNIA REPAIR       LEFT HEART CATHETERIZATION WITH CORONARY ANGIOGRAM N/A 09/29/2011    Procedure: LEFT HEART CATHETERIZATION WITH CORONARY ANGIOGRAM;  Surgeon: Jerel Balding, MD;  Location: [facility];  Service: Cardiovascular;  Laterality: N/A;   LOWER EXTREMITY ANGIOGRAPHY Left 10/08/2023    Procedure: Lower  Extremity Angiography;  Surgeon: Pearline Norman RAMAN, MD;  Location: [facility] INVASIVE CV LAB;  Service: Cardiovascular;  Laterality: Left;   LOWER EXTREMITY INTERVENTION Left 10/08/2023    Procedure: LOWER EXTREMITY INTERVENTION;  Surgeon: Pearline Norman RAMAN, MD;  Location: [facility] INVASIVE CV LAB;  Service: Cardiovascular;  Laterality: Left;   NM MYOVIEW  LTD   07/08/2011    mild LV dilatation,mild septal hypokinesia   POLYPECTOMY   05/17/2020    Procedure: POLYPECTOMY;  Surgeon: Rosalie Kitchens, MD;  Location: [facility];  Service: Endoscopy;;   POLYPECTOMY   04/01/2024    Procedure: POLYPECTOMY, INTESTINE;  Surgeon: Rosalie Kitchens, MD;  Location: [facility];  Service: Gastroenterology;;   RIGHT HEART CATH N/A 04/17/2023    Procedure: RIGHT HEART CATH;  Surgeon: Anner Alm ORN, MD;  Location: [facility] INVASIVE CV LAB;  Service: Cardiovascular;  Laterality: N/A;   TONSILLECTOMY       US  ECHOCARDIOGRAPHY   09/07/2011    EF 35-45%,mod.ant hypokinesis,boderline LA & RA enlargement,trace MR,TR & PI          Short Social History:  Social History         Tobacco Use   Smoking status: Never   Smokeless tobacco: Never   Tobacco comments:      Second hand smoke for 26 years per pt  Substance Use Topics   Alcohol  use: No      Alcohol /week: 0.0 standard drinks of alcohol       [Allergies]  [Allergies]      Allergen Reactions   Ciprofloxacin        Tendons problem in shoulder Other reaction(s): Arthralgias (intolerance), Myalgias (intolerance)   Jardiance [Empagliflozin] Other (See Comments)      Dehydration, uti.   Levaquin [Levofloxacin]        Other reaction(s): Arthralgias (intolerance), Myalgias (intolerance)   Aricept  [Donepezil  Hcl] Nausea Only   Bactrim [Sulfamethoxazole-Trimethoprim] Hives   Breztri  Aerosphere [Budeson-Glycopyrrol-Formoterol ] Other (See Comments)      Patient and wife are unsure what happened, however they remember he had a bad reaction   Sulfamethoxazole     Trimethoprim      Misc. Sulfonamide Containing Compounds Rash           Current Outpatient Medications  Medication Sig Dispense Refill   aspirin  EC 81 MG tablet Take 81 mg by mouth daily. Swallow whole.       clopidogrel  (PLAVIX ) 75 MG tablet Take 1 tablet (75 mg total) by mouth daily.  Continuous Blood Gluc Sensor (FREESTYLE LIBRE 14 DAY SENSOR) MISC Inject 1 Application into the skin every 14 (fourteen) days.       finasteride  (PROSCAR ) 5 MG tablet Take 5 mg by mouth in the morning.       fluticasone  (FLONASE ) 50 MCG/ACT nasal spray Place 1 spray into both nostrils daily as needed for allergies.       furosemide  (LASIX ) 40 MG tablet Take 40 mg by mouth daily. (Patient taking differently: Take 40 mg by mouth as needed for fluid.)       insulin  lispro (HUMALOG  KWIKPEN) 200 UNIT/ML KwikPen Inject 30-130 Units into the skin in the morning, at noon, and at bedtime. Sliding scale If blood sugar is over 140       memantine  (NAMENDA ) 10 MG tablet Take 1 tablet (10 mg total) by mouth 2 (two) times daily. 180 tablet 3   metFORMIN  (GLUCOPHAGE ) 1000 MG tablet Take 1 tablet (1,000 mg total) by mouth 2 (two) times daily with a meal. Restart on 8/22       metoprolol  succinate (TOPROL -XL) 50 MG 24 hr tablet Take 50 mg by mouth 2 (two) times daily. Take with or immediately following a meal.       OZEMPIC, 1 MG/DOSE, 4 MG/3ML SOPN inject 1mg  Subcutaneous weekly; Duration: 30 days       pantoprazole  (PROTONIX ) 40 MG tablet Take 1 tablet (40 mg total) by mouth 2 (two) times daily. 180 tablet 0   rosuvastatin  (CRESTOR ) 20 MG tablet TAKE ONE TABLET BY MOUTH ONE TIME DAILY (Patient taking differently: Take 20 mg by mouth every evening.) 90 tablet 3   sacubitril -valsartan  (ENTRESTO ) 97-103 MG TAKE ONE TABLET BY MOUTH TWICE DAILY 180 tablet 3   silodosin (RAPAFLO) 8 MG CAPS capsule Take 8 mg by mouth every evening.       spironolactone  (ALDACTONE ) 25 MG tablet Take one-half tablet by mouth daily 45 tablet 3   TRESIBA FLEXTOUCH  200 UNIT/ML FlexTouch Pen Inject 100 Units into the skin in the morning.          No current facility-administered medications for this visit.        REVIEW OF SYSTEMS  All other systems were reviewed and are negative       Objective:    Objective[] Expand by Default    Vitals:    04/18/24 1116  BP: 127/82  Pulse: 84  Resp: 18  Temp: 98.1 F (36.7 C)  TempSrc: Temporal  SpO2: 93%  Weight: 195 lb 8 oz (88.7 kg)  Height: 5' 8 (1.727 m)    Body mass index is 29.73 kg/m.   Physical Exam General: no acute distress Cardiac: hemodynamically stable Extremities: no edema, cyanosis or wounds Vascular:              Right: Palpable DP, PT             Left: Palpable DP, PT   Data: ABI +---------+------------------+-----+---------+--------+  Right   Rt Pressure (mmHg)IndexWaveform Comment   +---------+------------------+-----+---------+--------+  Brachial 142                                       +---------+------------------+-----+---------+--------+  PTA     180               1.27 triphasic          +---------+------------------+-----+---------+--------+  DP      175  1.23 triphasic          +---------+------------------+-----+---------+--------+  Great Toe164               1.15 Normal             +---------+------------------+-----+---------+--------+   +---------+------------------+-----+---------+-------+  Left    Lt Pressure (mmHg)IndexWaveform Comment  +---------+------------------+-----+---------+-------+  Brachial 134                                      +---------+------------------+-----+---------+-------+  PTA     163               1.15 triphasic         +---------+------------------+-----+---------+-------+  DP      157               1.11 triphasic         +---------+------------------+-----+---------+-------+  Great Toe130               0.92 Normal             +---------+------------------+-----+---------+-------+    Duplex +-----------+--------+-----+--------+---------+--------------+  RIGHT     PSV cm/sRatioStenosisWaveform Comments        +-----------+--------+-----+--------+---------+--------------+  CFA Distal 60                   triphasic                +-----------+--------+-----+--------+---------+--------------+  DFA       43                   triphasic                +-----------+--------+-----+--------+---------+--------------+  SFA Prox   78                   triphasic                +-----------+--------+-----+--------+---------+--------------+  SFA Mid    88                   triphasic                +-----------+--------+-----+--------+---------+--------------+  SFA Distal 87                   triphasic                +-----------+--------+-----+--------+---------+--------------+  POP Prox   22                   triphasic1.51 x 1.57 cm  +-----------+--------+-----+--------+---------+--------------+  POP Mid                                  1.88 x 2.29 cm  +-----------+--------+-----+--------+---------+--------------+  POP Distal 42                   triphasic1.29 x 1.67 cm  +-----------+--------+-----+--------+---------+--------------+  ATA Distal 74                   triphasic                +-----------+--------+-----+--------+---------+--------------+  PTA Distal 104                  triphasic                +-----------+--------+-----+--------+---------+--------------+  PERO Distal48                   triphasic                +-----------+--------+-----+--------+---------+--------------+        +-----------+--------+-----+--------+---------+--------------+  LEFT      PSV cm/sRatioStenosisWaveform Comments        +-----------+--------+-----+--------+---------+--------------+  CFA Distal 45                   triphasic                 +-----------+--------+-----+--------+---------+--------------+  DFA       60                   triphasic                +-----------+--------+-----+--------+---------+--------------+  SFA Prox   58                   triphasic                +-----------+--------+-----+--------+---------+--------------+  SFA Mid    54                   triphasic                +-----------+--------+-----+--------+---------+--------------+  SFA Distal 52                   triphasic                +-----------+--------+-----+--------+---------+--------------+  POP Prox                                 1.77 x 1.98 cm  +-----------+--------+-----+--------+---------+--------------+  POP Mid                                  1.39 x 1.24 cm  +-----------+--------+-----+--------+---------+--------------+  POP Distal 76                   triphasic0.86 x 0.83 cm  +-----------+--------+-----+--------+---------+--------------+  ATA Distal 54                   triphasic                +-----------+--------+-----+--------+---------+--------------+  PTA Distal 73                   triphasic                +-----------+--------+-----+--------+---------+--------------+  PERO Distal44                   triphasic                +-----------+--------+-----+--------+---------+--------------+     Left Stent(s):  +---------------+--------+--------+---------+--------+  SFA-POPA      PSV cm/sStenosisWaveform Comments  +---------------+--------+--------+---------+--------+  Prox to Stent  52              triphasic          +---------------+--------+--------+---------+--------+  Proximal Stent 41              triphasic          +---------------+--------+--------+---------+--------+  Mid Stent      64              triphasic          +---------------+--------+--------+---------+--------+  Distal Stent   59               triphasic          +---------------+--------+--------+---------+--------+  Distal to Stent76              triphasic          +---------------+--------+--------+---------+--------+         Assessment/Plan:    Donald [name] is a 74 y.o. male with bilateral popliteal aneurysms.  He underwent left popliteal artery stenting in June 2025.  He has been doing well and the stents are widely patent on duplex. On the right his popliteal aneurysm is measuring 2.3 on duplex.  We discussed the risk benefits of angiogram with popliteal artery stenting of the right side in order to prevent his risk of thrombosis or embolization given that it is greater than 2cm.    Plan for right leg angiogram with popliteal artery aneurysm stenting from a left groin access. Instructed to continue to take aspirin  and Plavix    Of note he does not have an aortic aneurysm in the chest or abdomen.  CT scans reviewed from March and September 2025.   Norman GORMAN Serve MD [facility]

## 2024-05-26 NOTE — Op Note (Signed)
" ° ° °  Patient name: Donald [name] MRN: [id number] DOB: 11-Apr-1951 Sex: male  05/26/2024 Pre-operative Diagnosis: Right popliteal artery aneurysm Post-operative diagnosis:  Same Surgeon:  Norman GORMAN Serve, MD Procedure Performed:  Ultrasound-guided access of left common femoral artery Aortogram and right lower extremity angiogram Third order recannulation of right popliteal artery Stenting of right popliteal artery, 8 mm x 15 cm Viabahn Pro-glide closure of left common femoral artery  Indications: [name] is a 74 year old male who was found of bilateral popliteal artery aneurysms.  Both greater than 2 cm and later with thrombus.  He underwent left popliteal artery stenting in June which went well.  He presented back to the office and we agreed to proceed with right popliteal artery stenting.  Risks and benefits were reviewed and they elected to proceed.  Findings:  With a patent aorta and bilateral renal arteries.  Widely patent iliac systems bilaterally. Arteriomegaly with a long segment aneurysmal right popliteal artery.  Three-vessel runoff.   Procedure:  The patient was identified in the holding area and taken to the cath lab  The patient was then placed supine on the table and prepped and draped in the usual sterile fashion.  A time out was called.  Ultrasound was used to evaluate the left common femoral artery.  It was patent .  A digital ultrasound image was acquired.  A micropuncture needle was used to access the left common femoral artery under ultrasound guidance.  An 018 wire was advanced without resistance and a micropuncture sheath was placed.  The 018 wire was removed and a benson wire was placed.  The micropuncture sheath was exchanged for a 5 french sheath.  An omniflush catheter was advanced over the wire to the level of L-1.  An abdominal angiogram was obtained.  Next, using the omniflush catheter and a Bentson wire, the aortic bifurcation was crossed and the catheter was  placed into theright external iliac artery and right runoff was obtained. This demonstrated the above findings.  A glide advantage wire was placed through the catheter and into the right SFA.  The short 5 French sheath was then exchanged for 8 French by 45 cm catapult sheath.  The patient was systemically heparinized.  Using the [facility] and a quick cross catheter the aneurysm was crossed and the wire placed into the peroneal artery.  This wire was then exchanged for a wire.  An 8 mm x 15 cm Viabahn stent was then placed from the below-knee popliteal artery into the above-knee popliteal artery covering the aneurysm.  This was then postdilated with an 8 mm Sterling balloon.  Angiogram demonstrated preserved runoff.  Bentson wire was then placed through the sheath.  The 7 French sheath was removed and a Pro-glide device was deployed with excellent hemostasis.  Patient was then given 50 mg of protamine .  Contrast: 55 cc Sedation: None  Impression: Adequate seal of the right popliteal artery aneurysm with Viabahn stenting.  Three-vessel runoff on completion   Norman GORMAN Serve MD [facility] [place] Office: [phone]  "

## 2024-05-27 ENCOUNTER — Other Ambulatory Visit: Payer: Self-pay | Admitting: Cardiovascular Disease

## 2024-05-27 ENCOUNTER — Encounter (HOSPITAL_COMMUNITY): Payer: Self-pay | Admitting: Vascular Surgery

## 2024-05-30 ENCOUNTER — Ambulatory Visit: Payer: Self-pay | Admitting: Neurology

## 2024-05-30 NOTE — Procedures (Signed)
 "  [facility] POLYSOMNOGRAPHY  INTERPRETATION REPORT   STUDY DATE:  05/20/2024     PATIENT NAME:  Donald [name]         DATE OF BIRTH:  Jan 17, 1951  PATIENT ID:  [id number]    TYPE OF STUDY:  SPLIT  PHYSICIAN: DEDRA GORES, MD REFERRED BY: Greig Forbes, NP  SCORING TECHNICIAN: Donnice Counts, RPSGT   HISTORY:  This 74 year-old Male patient was seen on 04-14-2024 and has a past medical history of Allergic rhinitis, seasonal, Asthma, Bronchitis, CHF (congestive heart failure) (HCC), Chronic kidney disease, stage I, Cognitive impairment, collar bone, Degenerative joint disease of knee, Diabetes mellitus, Dysrhythmia, Fatty liver, GERD (gastroesophageal reflux disease), Headache, Heart murmur, History of acute prostatitis, History of hematuria, History of hiatal hernia, Hyperlipidemia, Hypertension, IgA nephropathy,  Peripheral vascular disease, Pneumonia, and Sinusitis. OSA on CPAP, here with aerophagia on CPAP, no significant residual apnea hyponea index,  and uses 4 L oxygen  at night, has memory concerns. MOCA was 28/ 30.    ADDITIONAL INFORMATION:  The Epworth Sleepiness Scale was  endorsed at 3 /24 points (scores above or equal to 10 are suggestive of hypersomnolence). FSS endorsed at  X /63 points.  Height: 69 in Weight: 202 lb (BMI 29) Neck Size: 17 in  MEDICATIONS: Aldactone , Rapaflo, Crestor , Entresto , Protonix , Fish Oil, Toprol  - XL, Glucophage , Proventil  HFA, Vitamin C, Vitamin B 12, Aricept , Trulicity, Cymbalta , Proscar , Lasix , Vicodin, Novolog , Tresiba, Namenda   SLEEP CONTINUITY AND SLEEP ARCHITECTURE:  Lights-out was at 21:05: and lights-on at  05:00:, with  7.9 hours of recording time . Total sleep time ( TST) was 339.0 minutes with a decreased sleep efficiency at 71.4%.  Sleep latency was normal at 25.5 minutes.  REM sleep latency was normal at 78.0 minutes. Of the total sleep time, the percentage of stage N1 sleep was 6.8%, stage N2 sleep was  81%, stage N3 sleep was 0.3%, and REM sleep was 11.8%.  There were 2 Stage R periods observed on this study night, 33 awakenings (i.e. transitions to Stage W from any sleep stage), and 106 total stage transitions. Wake after sleep onset (WASO) time accounted for 110 minutes.    BODY POSITION:  Duration of total sleep and percent of total sleep in their respective position is as follows: supine 321 minutes (95%), non-supine 18 minutes (5%); right 01 minutes (0%), left 16 minutes (5%), and prone 00 minutes (0%). Total supine REM sleep time was 40 minutes (100% of total REM sleep). RESPIRATORY MONITORING:   Based on CMS criteria (using a 4% oxygen  desaturation rule for scoring hypopneas), there were 11 apneas (11 obstructive; 0 central; 0 mixed), and 3 hypopneas.   The Apnea index was 1.9. Hypopnea index was 0.5. The total AHI (apnea-hypopnea index) was 2.5/h overall (2.4 supine, 3.4/h in non-supine; 3.0/h in  REM, 2.4/h in NREM). There were 0 respiratory effort-related arousals (RERAs).   OXIMETRY: Oxyhemoglobin Saturation Nadir during sleep was  84% from a mean of 91%.  Total sleep time (TST)  with hypoxemia (<89%) was 14.5 minutes, or 4.3% of total sleep time.  LIMB MOVEMENTS: There were 0 periodic limb movements of sleep (0.0/hr), of which 0 (0.0/hr) were associated with an arousal. AROUSAL: There were 66 arousals in total.  Of these, 27 were identified as respiratory-related arousals (5 /h), 0 were PLM-related arousals (0 /h), and 39 were non-specific arousals (7 /h). EEG:  PSG EEG was of normal amplitude and frequency, with symmetric manifestation of  sleep stages. EKG: The electrocardiogram documented regular rate and rhythm, isolated PACs .  The average heart rate during sleep was 91 bpm.  The heart rate during sleep varied between a minimum of 84 and  a maximum of  111 bpm. AUDIO and VIDEO: Patient slept on a wedge, with CPAP neck pillow, elevated head of bed. Supplemental  oxygen  was started at  circa 10 pm following several  minutes of hypoxia.  Sleep was without nocturia, without snoring.  IMPRESSION: 1) No Sleep apnea was seen at 2 hour mark. Oxygen  desaturation was noted and oxygen  was supplemented. At no point was CPAP initiated.      2) Total sleep time was reduced at 339.0 minutes.  Sleep efficiency was decreased at 71.4%.   Sleep fragmentation  was noted in the second half of the night when some hypopneas were arising.   RECOMMENDATIONS: The overall AHI is too mild to diagnose sleep apnea, following CMS criteria. hypoxia was still present. CPAP will not be needed.   This means that CPAP would not be re-ordered, and oxygen  supplement orders will need in the future to be continued through PCP, or pulmonology / cardiology consultants, as the underlying diagnosis of apnea has fallen away.  DEDRA GORES,  MD         "

## 2024-06-02 ENCOUNTER — Encounter: Payer: Self-pay | Admitting: Pulmonary Disease

## 2024-06-02 DIAGNOSIS — G4734 Idiopathic sleep related nonobstructive alveolar hypoventilation: Secondary | ICD-10-CM

## 2024-06-03 ENCOUNTER — Ambulatory Visit: Admitting: Pulmonary Disease

## 2024-06-03 ENCOUNTER — Other Ambulatory Visit: Payer: Self-pay | Admitting: Family Medicine

## 2024-06-03 DIAGNOSIS — G4734 Idiopathic sleep related nonobstructive alveolar hypoventilation: Secondary | ICD-10-CM

## 2024-06-04 ENCOUNTER — Telehealth: Payer: Self-pay | Admitting: Family Medicine

## 2024-06-04 NOTE — Telephone Encounter (Signed)
 Referral for pulmonology fax to [facility]. Phone: [phone], Fax: [phone]

## 2024-06-10 ENCOUNTER — Encounter: Payer: Self-pay | Admitting: Pulmonary Disease

## 2024-06-11 NOTE — Telephone Encounter (Signed)
 New order placed today.NFN

## 2024-06-27 ENCOUNTER — Inpatient Hospital Stay: Admitting: Oncology

## 2024-06-27 ENCOUNTER — Inpatient Hospital Stay: Attending: Oncology

## 2024-07-02 ENCOUNTER — Ambulatory Visit: Admitting: Primary Care

## 2024-08-13 ENCOUNTER — Encounter: Admitting: Psychology

## 2024-08-20 ENCOUNTER — Encounter: Admitting: Psychology

## 2024-09-01 ENCOUNTER — Ambulatory Visit: Admitting: Neurology

## 2024-09-05 ENCOUNTER — Encounter

## 2024-09-05 ENCOUNTER — Ambulatory Visit (HOSPITAL_COMMUNITY)

## 2024-09-30 ENCOUNTER — Ambulatory Visit: Admitting: Cardiovascular Disease
